# Patient Record
Sex: Female | Born: 1960
Health system: Southern US, Community
[De-identification: ages and names within clinical notes are randomized; demographics above are authoritative.]

## PROBLEM LIST (undated history)

## (undated) DIAGNOSIS — G43909 Migraine, unspecified, not intractable, without status migrainosus: Secondary | ICD-10-CM

## (undated) DIAGNOSIS — T783XXA Angioneurotic edema, initial encounter: Secondary | ICD-10-CM

## (undated) DIAGNOSIS — K219 Gastro-esophageal reflux disease without esophagitis: Secondary | ICD-10-CM

## (undated) DIAGNOSIS — I499 Cardiac arrhythmia, unspecified: Secondary | ICD-10-CM

## (undated) DIAGNOSIS — D219 Benign neoplasm of connective and other soft tissue, unspecified: Secondary | ICD-10-CM

## (undated) DIAGNOSIS — I10 Essential (primary) hypertension: Secondary | ICD-10-CM

## (undated) DIAGNOSIS — L509 Urticaria, unspecified: Secondary | ICD-10-CM

## (undated) DIAGNOSIS — IMO0001 Reserved for inherently not codable concepts without codable children: Secondary | ICD-10-CM

## (undated) DIAGNOSIS — Z95810 Presence of automatic (implantable) cardiac defibrillator: Secondary | ICD-10-CM

## (undated) DIAGNOSIS — N951 Menopausal and female climacteric states: Secondary | ICD-10-CM

## (undated) DIAGNOSIS — J309 Allergic rhinitis, unspecified: Secondary | ICD-10-CM

## (undated) HISTORY — PX: ADENOIDECTOMY: SUR15

## (undated) HISTORY — PX: INGUINAL HERNIA REPAIR: SUR1180

## (undated) HISTORY — DX: Benign neoplasm of connective and other soft tissue, unspecified: D21.9

## (undated) HISTORY — PX: TONSILLECTOMY: SUR1361

## (undated) HISTORY — DX: Allergic rhinitis, unspecified: J30.9

## (undated) HISTORY — PX: KNEE SURGERY: SHX244

## (undated) HISTORY — DX: Reserved for inherently not codable concepts without codable children: IMO0001

## (undated) HISTORY — DX: Urticaria, unspecified: L50.9

## (undated) HISTORY — DX: Cardiac arrhythmia, unspecified: I49.9

## (undated) HISTORY — DX: Angioneurotic edema, initial encounter: T78.3XXA

## (undated) HISTORY — DX: Gastro-esophageal reflux disease without esophagitis: K21.9

## (undated) HISTORY — DX: Menopausal and female climacteric states: N95.1

## (undated) HISTORY — DX: Migraine, unspecified, not intractable, without status migrainosus: G43.909

## (undated) HISTORY — PX: ESOPHAGOGASTRODUODENOSCOPY: SHX1529

## (undated) HISTORY — PX: TONSILLECTOMY AND ADENOIDECTOMY: SHX28

---

## 2006-07-02 HISTORY — PX: BREAST BIOPSY: SHX20

## 2006-07-08 ENCOUNTER — Encounter: Admission: RE | Admit: 2006-07-08 | Discharge: 2006-07-08 | Payer: Self-pay | Admitting: Unknown Physician Specialty

## 2010-07-23 ENCOUNTER — Encounter: Payer: Self-pay | Admitting: Unknown Physician Specialty

## 2011-06-02 HISTORY — PX: COLONOSCOPY: SHX174

## 2014-01-12 ENCOUNTER — Other Ambulatory Visit (HOSPITAL_COMMUNITY): Payer: Self-pay | Admitting: Family Medicine

## 2014-01-12 DIAGNOSIS — R599 Enlarged lymph nodes, unspecified: Secondary | ICD-10-CM

## 2014-01-14 ENCOUNTER — Ambulatory Visit (HOSPITAL_COMMUNITY)
Admission: RE | Admit: 2014-01-14 | Discharge: 2014-01-14 | Disposition: A | Discharge status: Home / Self Care | Payer: 59 | Source: Ambulatory Visit | Attending: Family Medicine | Admitting: Family Medicine

## 2014-01-14 ENCOUNTER — Encounter (HOSPITAL_COMMUNITY): Payer: Self-pay

## 2014-01-14 DIAGNOSIS — R599 Enlarged lymph nodes, unspecified: Secondary | ICD-10-CM | POA: Insufficient documentation

## 2014-01-14 MED ORDER — IOHEXOL 300 MG/ML  SOLN
75.0000 mL | Freq: Once | INTRAMUSCULAR | Status: AC | PRN
  Administered 2014-01-14: 75 mL via INTRAVENOUS

## 2014-02-04 ENCOUNTER — Other Ambulatory Visit (HOSPITAL_COMMUNITY): Payer: Self-pay | Admitting: Family Medicine

## 2014-02-04 DIAGNOSIS — Z139 Encounter for screening, unspecified: Secondary | ICD-10-CM

## 2014-02-12 ENCOUNTER — Ambulatory Visit (HOSPITAL_COMMUNITY)
Admission: RE | Admit: 2014-02-12 | Discharge: 2014-02-12 | Disposition: A | Discharge status: Home / Self Care | Payer: 59 | Source: Ambulatory Visit | Attending: Family Medicine | Admitting: Family Medicine

## 2014-02-12 DIAGNOSIS — Z1231 Encounter for screening mammogram for malignant neoplasm of breast: Secondary | ICD-10-CM | POA: Diagnosis not present

## 2014-02-12 DIAGNOSIS — Z139 Encounter for screening, unspecified: Secondary | ICD-10-CM

## 2014-03-09 ENCOUNTER — Encounter: Payer: Self-pay | Admitting: Adult Health

## 2014-03-09 ENCOUNTER — Ambulatory Visit (INDEPENDENT_AMBULATORY_CARE_PROVIDER_SITE_OTHER): Payer: 59 | Admitting: Adult Health

## 2014-03-09 VITALS — BP 110/72 | HR 74 | Ht 64.0 in | Wt 144.0 lb

## 2014-03-09 DIAGNOSIS — D259 Leiomyoma of uterus, unspecified: Secondary | ICD-10-CM

## 2014-03-09 DIAGNOSIS — R232 Flushing: Secondary | ICD-10-CM

## 2014-03-09 DIAGNOSIS — N951 Menopausal and female climacteric states: Secondary | ICD-10-CM

## 2014-03-09 DIAGNOSIS — Z1212 Encounter for screening for malignant neoplasm of rectum: Secondary | ICD-10-CM

## 2014-03-09 DIAGNOSIS — Z01419 Encounter for gynecological examination (general) (routine) without abnormal findings: Secondary | ICD-10-CM

## 2014-03-09 DIAGNOSIS — D219 Benign neoplasm of connective and other soft tissue, unspecified: Secondary | ICD-10-CM | POA: Insufficient documentation

## 2014-03-09 HISTORY — DX: Menopausal and female climacteric states: N95.1

## 2014-03-09 LAB — HEMOCCULT GUIAC POC 1CARD (OFFICE): Fecal Occult Blood, POC: NEGATIVE

## 2014-03-09 NOTE — Progress Notes (Signed)
Patient ID: Rose Delgado, female   DOB: 25-Feb-1961, 53 y.o.   MRN: [id number] History of Present Illness: Rose Delgado is a 53 year old black female in for a physical,she is new to this practice.She had normal pap last year.She is having hot flashes.She had been on depo til October 2014 and she stopped it.She had a period in July and then in August 24.She works in [place] at [employer].   Current Medications, Allergies, Past Medical History, Past Surgical History, Family History and Social History were reviewed in [employer] record.     Review of Systems: Patient denies any daily headaches, blurred vision, shortness of breath, chest pain, abdominal pain, problems with bowel movements, urination, or intercourse. Not having sex, no joint swelling or mood swings.    Physical Exam:BP 110/72  Pulse 74  Ht 5\' 4"  (1.626 m)  Wt 144 lb (65.318 kg)  BMI 24.71 kg/m2  LMP 02/15/2014 General:  Well developed, well nourished, no acute distress Skin:  Warm and dry Neck:  Midline trachea, mildly enlarged thyroid Lungs; Clear to auscultation bilaterally Breast:  No dominant palpable mass, retraction, or nipple discharge Cardiovascular: Regular rate and rhythm Abdomen:  Soft, non tender, no hepatosplenomegaly Pelvic:  External genitalia is normal in appearance.  The vagina is normal in appearance.  The cervix is smooth.  Uterus is felt to be slightly enlarged in size,has known fibroids.  No   adnexal masses or tenderness noted. Rectal: Good sphincter tone, no polyps, or hemorrhoids felt.  Hemoccult negative. Extremities:  No swelling or varicosities noted Psych:  No mood changes, alert and cooperative,seems happy Discussed HRT and brisdelle for hot flashes, will watch for now,also discussed symptoms of menopause.   Impression: New pt gyn exam no pap Hot flashes Peri menopausal Fibroids     Plan: Physical in 1 year Request copy of last pap Mammogram yearly Colonoscopy in 7  years Lab at PCP or work Get flu shot at work Review handouts on peri menopause and menopause Call as needed with concerns

## 2014-03-09 NOTE — Patient Instructions (Signed)
Menopause Menopause is the normal time of life when menstrual periods stop completely. Menopause is complete when you have missed 12 consecutive menstrual periods. It usually occurs between the ages of 48 years and 55 years. Very rarely does a woman develop menopause before the age of 40 years. At menopause, your ovaries stop producing the female hormones estrogen and progesterone. This can cause undesirable symptoms and also affect your health. Sometimes the symptoms may occur 4-5 years before the menopause begins. There is no relationship between menopause and:  Oral contraceptives.  Number of children you had.  Race.  The age your menstrual periods started (menarche). Heavy smokers and very thin women may develop menopause earlier in life. CAUSES  The ovaries stop producing the female hormones estrogen and progesterone.  Other causes include:  Surgery to remove both ovaries.  The ovaries stop functioning for no known reason.  Tumors of the pituitary gland in the brain.  Medical disease that affects the ovaries and hormone production.  Radiation treatment to the abdomen or pelvis.  Chemotherapy that affects the ovaries. SYMPTOMS   Hot flashes.  Night sweats.  Decrease in sex drive.  Vaginal dryness and thinning of the vagina causing painful intercourse.  Dryness of the skin and developing wrinkles.  Headaches.  Tiredness.  Irritability.  Memory problems.  Weight gain.  Bladder infections.  Hair growth of the face and chest.  Infertility. More serious symptoms include:  Loss of bone (osteoporosis) causing breaks (fractures).  Depression.  Hardening and narrowing of the arteries (atherosclerosis) causing heart attacks and strokes. DIAGNOSIS   When the menstrual periods have stopped for 12 straight months.  Physical exam.  Hormone studies of the blood. TREATMENT  There are many treatment choices and nearly as many questions about them. The  decisions to treat or not to treat menopausal changes is an individual choice made with your health care provider. Your health care provider can discuss the treatments with you. Together, you can decide which treatment will work best for you. Your treatment choices may include:   Hormone therapy (estrogen and progesterone).  Non-hormonal medicines.  Treating the individual symptoms with medicine (for example antidepressants for depression).  Herbal medicines that may help specific symptoms.  Counseling by a psychiatrist or psychologist.  Group therapy.  Lifestyle changes including:  Eating healthy.  Regular exercise.  Limiting caffeine and alcohol.  Stress management and meditation.  No treatment. HOME CARE INSTRUCTIONS   Take the medicine your health care provider gives you as directed.  Get plenty of sleep and rest.  Exercise regularly.  Eat a diet that contains calcium (good for the bones) and soy products (acts like estrogen hormone).  Avoid alcoholic beverages.  Do not smoke.  If you have hot flashes, dress in layers.  Take supplements, calcium, and vitamin D to strengthen bones.  You can use over-the-counter lubricants or moisturizers for vaginal dryness.  Group therapy is sometimes very helpful.  Acupuncture may be helpful in some cases. SEEK MEDICAL CARE IF:   You are not sure you are in menopause.  You are having menopausal symptoms and need advice and treatment.  You are still having menstrual periods after age 55 years.  You have pain with intercourse.  Menopause is complete (no menstrual period for 12 months) and you develop vaginal bleeding.  You need a referral to a specialist (gynecologist, psychiatrist, or psychologist) for treatment. SEEK IMMEDIATE MEDICAL CARE IF:   You have severe depression.  You have excessive vaginal bleeding.    You fell and think you have a broken bone.  You have pain when you urinate.  You develop leg or  chest pain.  You have a fast pounding heart beat (palpitations).  You have severe headaches.  You develop vision problems.  You feel a lump in your breast.  You have abdominal pain or severe indigestion. Document Released: 09/08/2003 Document Revised: 02/18/2013 Document Reviewed: 01/15/2013 [facility] Patient Information 2015 [place], [place]. This information is not intended to replace advice given to you by your health care provider. Make sure you discuss any questions you have with your health care provider. Perimenopause Perimenopause is the time when your body begins to move into the menopause (no menstrual period for 12 straight months). It is a natural process. Perimenopause can begin 2-8 years before the menopause and usually lasts for 1 year after the menopause. During this time, your ovaries may or may not produce an egg. The ovaries vary in their production of estrogen and progesterone hormones each month. This can cause irregular menstrual periods, difficulty getting pregnant, vaginal bleeding between periods, and uncomfortable symptoms. CAUSES  Irregular production of the ovarian hormones, estrogen and progesterone, and not ovulating every month.  Other causes include:  Tumor of the pituitary gland in the brain.  Medical disease that affects the ovaries.  Radiation treatment.  Chemotherapy.  Unknown causes.  Heavy smoking and excessive alcohol intake can bring on perimenopause sooner. SIGNS AND SYMPTOMS   Hot flashes.  Night sweats.  Irregular menstrual periods.  Decreased sex drive.  Vaginal dryness.  Headaches.  Mood swings.  Depression.  Memory problems.  Irritability.  Tiredness.  Weight gain.  Trouble getting pregnant.  The beginning of losing bone cells (osteoporosis).  The beginning of hardening of the arteries (atherosclerosis). DIAGNOSIS  Your health care provider will make a diagnosis by analyzing your age, menstrual history, and  symptoms. He or she will do a physical exam and note any changes in your body, especially your female organs. Female hormone tests may or may not be helpful depending on the amount of female hormones you produce and when you produce them. However, other hormone tests may be helpful to rule out other problems. TREATMENT  In some cases, no treatment is needed. The decision on whether treatment is necessary during the perimenopause should be made by you and your health care provider based on how the symptoms are affecting you and your lifestyle. Various treatments are available, such as:  Treating individual symptoms with a specific medicine for that symptom.  Herbal medicines that can help specific symptoms.  Counseling.  Group therapy. HOME CARE INSTRUCTIONS   Keep track of your menstrual periods (when they occur, how heavy they are, how long between periods, and how long they last) as well as your symptoms and when they started.  Only take over-the-counter or prescription medicines as directed by your health care provider.  Sleep and rest.  Exercise.  Eat a diet that contains calcium (good for your bones) and soy (acts like the estrogen hormone).  Do not smoke.  Avoid alcoholic beverages.  Take vitamin supplements as recommended by your health care provider. Taking vitamin E may help in certain cases.  Take calcium and vitamin D supplements to help prevent bone loss.  Group therapy is sometimes helpful.  Acupuncture may help in some cases. SEEK MEDICAL CARE IF:   You have questions about any symptoms you are having.  You need a referral to a specialist (gynecologist, psychiatrist, or psychologist). SEEK IMMEDIATE  MEDICAL CARE IF:   You have vaginal bleeding.  Your period lasts longer than 8 days.  Your periods are recurring sooner than 21 days.  You have bleeding after intercourse.  You have severe depression.  You have pain when you urinate.  You have severe  headaches.  You have vision problems. Document Released: 07/26/2004 Document Revised: 04/08/2013 Document Reviewed: 01/15/2013 [facility] Patient Information 2015 [place], [place]. This information is not intended to replace advice given to you by your health care provider. Make sure you discuss any questions you have with your health care provider. Physical in 1 year Mammogram yearly Colonoscopy in 7 years Labs at PCP

## 2014-03-11 ENCOUNTER — Encounter: Payer: Self-pay | Admitting: Gastroenterology

## 2014-03-11 ENCOUNTER — Ambulatory Visit (INDEPENDENT_AMBULATORY_CARE_PROVIDER_SITE_OTHER): Payer: 59 | Admitting: Gastroenterology

## 2014-03-11 VITALS — BP 124/73 | HR 75 | Temp 97.6°F | Ht 64.0 in | Wt 143.8 lb

## 2014-03-11 DIAGNOSIS — R439 Unspecified disturbances of smell and taste: Secondary | ICD-10-CM

## 2014-03-11 DIAGNOSIS — R432 Parageusia: Secondary | ICD-10-CM | POA: Insufficient documentation

## 2014-03-11 DIAGNOSIS — K219 Gastro-esophageal reflux disease without esophagitis: Secondary | ICD-10-CM

## 2014-03-11 MED ORDER — DEXLANSOPRAZOLE 60 MG PO CPDR: 60.0000 mg | DELAYED_RELEASE_CAPSULE | Freq: Every day | ORAL | Status: DC

## 2014-03-11 NOTE — Progress Notes (Signed)
Primary Care Physician:  [name], MD  Primary Gastroenterologist:  Barney Drain, MD   Chief Complaint  Patient presents with  . metallic taste    HPI:  Rose Delgado is a 53 y.o. female here for further evaluation of chronic intermittent metallic taste she has had over the past one year. Patient is a self-referral. She is a Therapist, sports at [employer]. She has seen her dentist, Dr. Ahmed Prima, and no findings found to explain her symptoms. Her PCP increased her pantoprazole to BID one month ago but no improvement. Previously was on prevacid several years ago but states she switched to pantoprazole at that time due to some mild metallic taste issues then.   Remotely saw Dr. Berneta Sages for GERD. EGD about 15-20 years ago. Saw Dr. Britta Mccreedy 2 years ago for a screening colonoscopy. States she is due for next exam in 2022.   Wakes up with metallic taste in her mouth. She states family/friends report she has bath breath like bowel. She reports normal taste and smell to food. Good appetite. No weight loss. No abdominal pain, vomiting, constipation, diarrhea, melena, brbpr. She has seasonal allergies and uses Flonase prn for allergic rhinitis. Has not used in 2 months.      Current Outpatient Prescriptions  Medication Sig Dispense Refill  . cyclobenzaprine (FLEXERIL) 10 MG tablet Take 10 mg by mouth as needed for muscle spasms.      Marland Kitchen docusate sodium (COLACE) 100 MG capsule Take 100 mg by mouth at bedtime.      Marland Kitchen ibuprofen (ADVIL,MOTRIN) 800 MG tablet Take 800 mg by mouth as needed.      . loratadine (CLARITIN) 10 MG tablet Take 10 mg by mouth daily.      . metoprolol tartrate (LOPRESSOR) 25 MG tablet Take 25 mg by mouth 2 (two) times daily.      . Multiple Vitamin (MULTIVITAMIN) tablet Take 1 tablet by mouth daily.      . pantoprazole (PROTONIX) 40 MG tablet Take 40 mg by mouth 2 (two) times daily.      . rizatriptan (MAXALT-MLT) 10 MG disintegrating tablet Take 10 mg by mouth as needed for migraine. May  repeat in 2 hours if needed      . SUMAtriptan (IMITREX) 100 MG tablet Take 100 mg by mouth every 2 (two) hours as needed for migraine or headache. May repeat in 2 hours if headache persists or recurs.      . Vitamin D, Ergocalciferol, (DRISDOL) 50000 UNITS CAPS capsule Take 50,000 Units by mouth every 7 (seven) days.       No current facility-administered medications for this visit.    Allergies as of 03/11/2014 - Review Complete 03/11/2014  Allergen Reaction Noted  . Flagyl [metronidazole] Nausea And Vomiting 03/09/2014  . Shellfish allergy  01/14/2014  . Topamax [topiramate] Other (See Comments) 03/09/2014  . Ciprofloxacin hcl Nausea Only 03/09/2014  . Diflucan [fluconazole] Nausea And Vomiting 03/09/2014    Past Medical History  Diagnosis Date  . Reflux   . Migraines   . Fibroids   . Irregular heart beat   . Allergic rhinitis   . Perimenopause 03/09/2014    Past Surgical History  Procedure Laterality Date  . Inguinal hernia repair    . Knee surgery Right   . Tonsillectomy and adenoidectomy    . Breast biopsy Left 2008    benign  . Colonoscopy  06/2011    Dr. Britta Mccreedy: per patient it was normal, follow up in 10 years.   Marland Kitchen  Esophagogastroduodenoscopy      Dr. Berneta Sages: late 1990s/early 2000    Family History  Problem Relation Age of Onset  . Other Mother     glaucoma; pacemaker  . Hypertension Mother   . Other Father     aneursym  . Cancer Sister     biliary, deceased age 53  . Hypertension Sister   . Cancer Brother     lung  . Hypertension Brother   . Sleep apnea Brother   . Hypertension Brother   . Hypertension Brother   . Hypertension Sister   . Hypertension Sister   . Hypertension Sister   . Colon cancer Neg Hx     History   Social History  . Marital Status: Divorced    Spouse Name: N/A    Number of Children: N/A  . Years of Education: N/A   Occupational History  . RN at [employer] History Main Topics  . Smoking status: Never  Smoker   . Smokeless tobacco: Never Used  . Alcohol Use: No  . Drug Use: No  . Sexual Activity: Not Currently   Other Topics Concern  . Not on file   Social History Narrative  . No narrative on file      ROS:  General: Negative for anorexia, weight loss, fever, chills, fatigue, weakness. Eyes: Negative for vision changes.  ENT: Negative for hoarseness, difficulty swallowing , nasal congestion. CV: Negative for chest pain, angina, palpitations, dyspnea on exertion, peripheral edema.  Respiratory: Negative for dyspnea at rest, dyspnea on exertion, cough, sputum, wheezing.  GI: See history of present illness. GU:  Negative for dysuria, hematuria, urinary incontinence, urinary frequency, nocturnal urination.  MS: Negative for joint pain, low back pain.  Derm: Negative for rash or itching.  Neuro: Negative for weakness, abnormal sensation, seizure, frequent headaches, memory loss, confusion.  Psych: Negative for anxiety, depression, suicidal ideation, hallucinations.  Endo: Negative for unusual weight change.  Heme: Negative for bruising or bleeding. Allergy: Negative for rash or hives.    Physical Examination:  BP 124/73  Pulse 75  Temp(Src) 97.6 F (36.4 C) (Oral)  Ht 5\' 4"  (1.626 m)  Wt 143 lb 12.8 oz (65.227 kg)  BMI 24.67 kg/m2  LMP 02/15/2014   General: Well-nourished, well-developed in no acute distress.  Head: Normocephalic, atraumatic.   Eyes: Conjunctiva pink, no icterus. Mouth: Oropharyngeal mucosa moist and pink , no lesions erythema or exudate. Neck: Supple without thyromegaly, masses, or lymphadenopathy.  Lungs: Clear to auscultation bilaterally.  Heart: Regular rate and rhythm, no murmurs rubs or gallops.  Abdomen: Bowel sounds are normal, nontender, nondistended, no hepatosplenomegaly or masses, no abdominal bruits or    hernia , no rebound or guarding.   Rectal: not performed Extremities: No lower extremity edema. No clubbing or deformities.  Neuro:  Alert and oriented x 4 , grossly normal neurologically.  Skin: Warm and dry, no rash or jaundice.   Psych: Alert and cooperative, normal mood and affect.  Labs: Heme negative X 1  Imaging Studies: Mm Digital Screening Bilateral  02/17/2014   CLINICAL DATA:  Screening.  EXAM: DIGITAL SCREENING BILATERAL MAMMOGRAM WITH CAD  COMPARISON:  Previous exam(s).  ACR Breast Density Category d: The breast tissue is extremely dense, which lowers the sensitivity of mammography.  FINDINGS: There are no findings suspicious for malignancy. Images were processed with CAD.  IMPRESSION: No mammographic evidence of malignancy. A result letter of this screening mammogram will be mailed directly to  the patient.  RECOMMENDATION: Screening mammogram in one year. (Code:SM-B-01Y)  BI-RADS CATEGORY  1: Negative.   Electronically Signed   By: Everlean Alstrom M.D.   On: 02/17/2014 08:01

## 2014-03-11 NOTE — Patient Instructions (Addendum)
1. Please stop pantoprazole and start Dexilant one daily. Samples provided. RX sent to pharmacy to see what your copay will be. 2. Allergic rhinitis can actually cause metallic taste. If you still have this problem after trial of Dexilant, then next step may be taking Flonase regularly for 1-2 months to see if helps your taste.  3. Call in 4 weeks and let me know how you are doing.

## 2014-03-11 NOTE — Progress Notes (Signed)
cc'ed to pcp °

## 2014-03-11 NOTE — Assessment & Plan Note (Signed)
Dysgeusia of uncertain etiology. Consider GERD and allergic rhinitis as etiology. Stop pantoprazole and start Lipscomb for next one month. If no noted improvement at that time, consider restarting Flonase prior to possible ENT. She will call with progress report in four weeks.   Discussed antireflux measures with patient. Typical heartburn well controlled but would encourage more water intake, avoid carbonated beverages. We have requested prior EGD report for review.

## 2014-04-22 ENCOUNTER — Telehealth: Payer: Self-pay | Admitting: Gastroenterology

## 2014-04-22 NOTE — Progress Notes (Signed)
Will route to DS to triage

## 2014-04-22 NOTE — Telephone Encounter (Signed)
See addendum to my OV note. Schedule EGD with [facility], triage. No OV needed.   If EGD negative, next step would be ENT evaluation.

## 2014-04-22 NOTE — Progress Notes (Addendum)
Reviewed records from Dr. West Carbo, EGD in September 2005. Severely reddened distal third of the esophagus, no evidence of Barrett's endoscopically, no biopsies.  Will offer EGD for GERD and dysgeusia unresponsive to PPI therapy.  OK to schedule with Dr. Oneida Alar, triage.

## 2014-04-22 NOTE — Telephone Encounter (Signed)
Pt seen [facility] on 9/10 and was told to call back in 4 weeks to let her know how the dexilant was working for her. She said she has some issues with it and it was expensive and didn't think it was helping her any better than when she was taking protonix.  She said that she wanted to go ahead and schedule her EGD.  Since it's been over 30 days from her OV do we need to bring her back into the office or can she be triaged? Please advise and call her at [phone]. She said she has a class this afternoon and if she doesn't answer to leave a message.

## 2014-04-22 NOTE — Telephone Encounter (Signed)
Pt called back and I told her that we are working on it and would call her as soon as we knew something

## 2014-05-06 ENCOUNTER — Telehealth: Payer: Self-pay

## 2014-05-06 NOTE — Progress Notes (Signed)
Pt aware. See separate triage.

## 2014-05-06 NOTE — Telephone Encounter (Addendum)
Gastroenterology Pre-Procedure Review  Request Date: 05/06/2014 Requesting Physician: Jason Coop, PA / Dr. Oneida Alar  PATIENT REVIEW QUESTIONS: The patient responded to the following health history questions as indicated:    1. Diabetes Melitis: no 2. Joint replacements in the past 12 months: no 3. Major health problems in the past 3 months: no 4. Has an artificial valve or MVP: no 5. Has a defibrillator: no 6. Has been advised in past to take antibiotics in advance of a procedure like teeth cleaning: no    MEDICATIONS & ALLERGIES:    Patient reports the following regarding taking any blood thinners:   Plavix? no Aspirin? no Coumadin? no  Patient confirms/reports the following medications:  Current Outpatient Prescriptions  Medication Sig Dispense Refill  . dexlansoprazole (DEXILANT) 60 MG capsule Take 1 capsule (60 mg total) by mouth daily. 90 capsule 3  . docusate sodium (COLACE) 100 MG capsule Take 100 mg by mouth at bedtime.    . fluticasone (FLONASE) 50 MCG/ACT nasal spray Place into both nostrils as needed for allergies or rhinitis.    Marland Kitchen loratadine (CLARITIN) 10 MG tablet Take 10 mg by mouth daily.    . metoprolol tartrate (LOPRESSOR) 25 MG tablet Take 25 mg by mouth 2 (two) times daily.    . Multiple Vitamin (MULTIVITAMIN) tablet Take 1 tablet by mouth daily.    . rizatriptan (MAXALT-MLT) 10 MG disintegrating tablet Take 10 mg by mouth as needed for migraine. May repeat in 2 hours if needed    . Vitamin D, Ergocalciferol, (DRISDOL) 50000 UNITS CAPS capsule Take 50,000 Units by mouth every 7 (seven) days.     No current facility-administered medications for this visit.    Patient confirms/reports the following allergies:  Allergies  Allergen Reactions  . Flagyl [Metronidazole] Nausea And Vomiting  . Shellfish Allergy   . Topamax [Topiramate] Other (See Comments)  . Ciprofloxacin Hcl Nausea Only  . Diflucan [Fluconazole] Nausea And Vomiting    No orders of the defined  types were placed in this encounter.    AUTHORIZATION INFORMATION Primary Insurance:   ID #:   Group #:  Pre-Cert / Auth required: Pre-Cert / Auth #:   Secondary Insurance:   ID #:   Group #:  Pre-Cert / Auth required: Pre-Cert / Auth #:   SCHEDULE INFORMATION: Procedure has been scheduled as follows:  Date:   05/19/2014     Time:  3:15 PM Location: [facility] Stay  This Gastroenterology Pre-Precedure Review Form is being routed to the following provider(s): Barney Drain, MD

## 2014-05-10 ENCOUNTER — Other Ambulatory Visit: Payer: Self-pay

## 2014-05-10 DIAGNOSIS — K219 Gastro-esophageal reflux disease without esophagitis: Secondary | ICD-10-CM

## 2014-05-10 NOTE — Telephone Encounter (Signed)
OK to schedule

## 2014-05-10 NOTE — Telephone Encounter (Signed)
Per Ginger, I had to change the time for the pt to 2:45 pm and she will need to be at the hospital at 1:45 PM. Discarded previous instructions and mailed these new ones with a note about the slight time change.

## 2014-05-10 NOTE — Telephone Encounter (Signed)
Instructions for the EGD mailed to pt.  

## 2014-05-10 NOTE — Telephone Encounter (Signed)
Routing to Neil Crouch, PA in Dr. Nona Dell absence this week.

## 2014-05-19 ENCOUNTER — Encounter (HOSPITAL_COMMUNITY): Payer: Self-pay

## 2014-05-19 ENCOUNTER — Other Ambulatory Visit: Payer: Self-pay

## 2014-05-19 ENCOUNTER — Encounter (HOSPITAL_COMMUNITY)
Admission: RE | Disposition: A | Discharge status: Home / Self Care | Payer: Self-pay | Source: Ambulatory Visit | Attending: Gastroenterology

## 2014-05-19 ENCOUNTER — Ambulatory Visit (HOSPITAL_COMMUNITY)
Admission: RE | Admit: 2014-05-19 | Discharge: 2014-05-19 | Disposition: A | Discharge status: Home / Self Care | Payer: 59 | Source: Ambulatory Visit | Attending: Gastroenterology | Admitting: Gastroenterology

## 2014-05-19 DIAGNOSIS — R432 Parageusia: Secondary | ICD-10-CM | POA: Insufficient documentation

## 2014-05-19 DIAGNOSIS — K2950 Unspecified chronic gastritis without bleeding: Secondary | ICD-10-CM | POA: Diagnosis not present

## 2014-05-19 DIAGNOSIS — K219 Gastro-esophageal reflux disease without esophagitis: Secondary | ICD-10-CM | POA: Diagnosis present

## 2014-05-19 DIAGNOSIS — Z888 Allergy status to other drugs, medicaments and biological substances status: Secondary | ICD-10-CM | POA: Insufficient documentation

## 2014-05-19 DIAGNOSIS — I499 Cardiac arrhythmia, unspecified: Secondary | ICD-10-CM | POA: Insufficient documentation

## 2014-05-19 DIAGNOSIS — G43909 Migraine, unspecified, not intractable, without status migrainosus: Secondary | ICD-10-CM | POA: Diagnosis not present

## 2014-05-19 DIAGNOSIS — R438 Other disturbances of smell and taste: Secondary | ICD-10-CM

## 2014-05-19 DIAGNOSIS — Z881 Allergy status to other antibiotic agents status: Secondary | ICD-10-CM | POA: Diagnosis not present

## 2014-05-19 DIAGNOSIS — J309 Allergic rhinitis, unspecified: Secondary | ICD-10-CM | POA: Insufficient documentation

## 2014-05-19 DIAGNOSIS — Z91013 Allergy to seafood: Secondary | ICD-10-CM | POA: Insufficient documentation

## 2014-05-19 HISTORY — PX: ESOPHAGOGASTRODUODENOSCOPY: SHX5428

## 2014-05-19 SURGERY — EGD (ESOPHAGOGASTRODUODENOSCOPY)
Anesthesia: Moderate Sedation

## 2014-05-19 MED ORDER — STERILE WATER FOR IRRIGATION IR SOLN
Status: DC | PRN
  Administered 2014-05-19: 15:00:00

## 2014-05-19 MED ORDER — LIDOCAINE VISCOUS 2 % MT SOLN
OROMUCOSAL | Status: DC | PRN
  Administered 2014-05-19: 4 mL via OROMUCOSAL

## 2014-05-19 MED ORDER — MEPERIDINE HCL 100 MG/ML IJ SOLN
INTRAMUSCULAR | Status: DC | PRN
  Administered 2014-05-19 (×4): 25 mg via INTRAVENOUS

## 2014-05-19 MED ORDER — MEPERIDINE HCL 100 MG/ML IJ SOLN
INTRAMUSCULAR | Status: AC
  Filled 2014-05-19: qty 2

## 2014-05-19 MED ORDER — MIDAZOLAM HCL 5 MG/5ML IJ SOLN
INTRAMUSCULAR | Status: DC | PRN
Start: 2014-05-19 — End: 2014-05-19
  Administered 2014-05-19: 2 mg via INTRAVENOUS
  Administered 2014-05-19: 1 mg via INTRAVENOUS
  Administered 2014-05-19: 2 mg via INTRAVENOUS

## 2014-05-19 MED ORDER — SODIUM CHLORIDE 0.9 % IV SOLN
INTRAVENOUS | Status: DC
  Administered 2014-05-19: 14:00:00 via INTRAVENOUS

## 2014-05-19 MED ORDER — PANTOPRAZOLE SODIUM 40 MG PO TBEC: DELAYED_RELEASE_TABLET | ORAL | Status: DC

## 2014-05-19 MED ORDER — LIDOCAINE VISCOUS 2 % MT SOLN
OROMUCOSAL | Status: AC
  Filled 2014-05-19: qty 15

## 2014-05-19 MED ORDER — MIDAZOLAM HCL 5 MG/5ML IJ SOLN
INTRAMUSCULAR | Status: AC
  Filled 2014-05-19: qty 10

## 2014-05-19 NOTE — Telephone Encounter (Signed)
REVIEWED.  

## 2014-05-19 NOTE — H&P (Signed)
Primary Care Physician:  [name], MD Primary Gastroenterologist:  Dr. Oneida Alar  Pre-Procedure History & Physical: HPI:  Rose Delgado is a 53 y.o. female here for DYSPEPSIA.  Past Medical History  Diagnosis Date  . Reflux   . Migraines   . Fibroids   . Irregular heart beat   . Allergic rhinitis   . Perimenopause 03/09/2014   Past Surgical History  Procedure Laterality Date  . Inguinal hernia repair    . Knee surgery Right   . Tonsillectomy and adenoidectomy    . Breast biopsy Left 2008    benign  . Colonoscopy  06/2011    Dr. Britta Mccreedy: per patient it was normal, follow up in 10 years.   . Esophagogastroduodenoscopy      Dr. Berneta Sages: late 1990s/early 2000    Prior to Admission medications   Medication Sig Start Date End Date Taking? Authorizing Provider  dexlansoprazole (DEXILANT) 60 MG capsule Take 1 capsule (60 mg total) by mouth daily. 03/11/14  Yes Mahala Menghini, PA-C  docusate sodium (COLACE) 100 MG capsule Take 100 mg by mouth at bedtime.   Yes Historical Provider, MD  fluticasone (FLONASE) 50 MCG/ACT nasal spray Place into both nostrils as needed for allergies or rhinitis.   Yes Historical Provider, MD  loratadine (CLARITIN) 10 MG tablet Take 10 mg by mouth daily.   Yes Historical Provider, MD  metoprolol tartrate (LOPRESSOR) 25 MG tablet Take 25 mg by mouth 2 (two) times daily.   Yes Historical Provider, MD  Multiple Vitamin (MULTIVITAMIN) tablet Take 1 tablet by mouth daily.   Yes Historical Provider, MD  rizatriptan (MAXALT-MLT) 10 MG disintegrating tablet Take 10 mg by mouth as needed for migraine. May repeat in 2 hours if needed   Yes Historical Provider, MD  Vitamin D, Ergocalciferol, (DRISDOL) 50000 UNITS CAPS capsule Take 50,000 Units by mouth every 7 (seven) days.   Yes Historical Provider, MD    Allergies as of 05/10/2014 - Review Complete 05/10/2014  Allergen Reaction Noted  . Flagyl [metronidazole] Nausea And Vomiting 03/09/2014  . Shellfish  allergy  01/14/2014  . Topamax [topiramate] Other (See Comments) 03/09/2014  . Ciprofloxacin hcl Nausea Only 03/09/2014  . Diflucan [fluconazole] Nausea And Vomiting 03/09/2014    Family History  Problem Relation Age of Onset  . Other Mother     glaucoma; pacemaker  . Hypertension Mother   . Other Father     aneursym  . Cancer Sister     biliary, deceased age 18  . Hypertension Sister   . Cancer Brother     lung  . Hypertension Brother   . Sleep apnea Brother   . Hypertension Brother   . Hypertension Brother   . Hypertension Sister   . Hypertension Sister   . Hypertension Sister   . Colon cancer Neg Hx     History   Social History  . Marital Status: Divorced    Spouse Name: N/A    Number of Children: N/A  . Years of Education: N/A   Occupational History  . RN at [employer] History Main Topics  . Smoking status: Never Smoker   . Smokeless tobacco: Never Used  . Alcohol Use: No  . Drug Use: No  . Sexual Activity: Not Currently   Other Topics Concern  . Not on file   Social History Narrative   Review of Systems: See HPI, otherwise negative ROS  Physical Exam: BP 122/76 mmHg  Pulse 75  Temp(Src) 97.5 F (36.4 C) (Oral)  Resp 22  Ht 5\' 4"  (1.626 m)  Wt 145 lb (65.772 kg)  BMI 24.88 kg/m2  SpO2 97%  LMP 05/03/2014 General:   Alert,  pleasant and cooperative in NAD Head:  Normocephalic and atraumatic. Neck:  Supple; Lungs:  Clear throughout to auscultation.    Heart:  Regular rate and rhythm. Abdomen:  Soft, nontender and nondistended. Normal bowel sounds, without guarding, and without rebound.   Neurologic:  Alert and  oriented x4;  grossly normal neurologically.  Impression/Plan:    DYSPEPSIA  PLAN:  EGD TODAY

## 2014-05-19 NOTE — Progress Notes (Signed)
REVIEWED-NO ADDITIONAL RECOMMENDATIONS. 

## 2014-05-19 NOTE — Discharge Instructions (Signed)
You have mild gastritis. I biopsied your stomach.   CONTINUE DEXILANT TO CONTROL REFLUX SYMPTOMS.  Check zinc level today. Add zinc 50 mg daily. I WILL REFER YOU TO NEUROLOGY FOR METALLIC TASTE IN YOUR MOUTH.  AVOID TRIGGERS FOR GASTRITIS. SEE INFO BELOW.  FOLLOW A LOW FAT DIET. SEE INFO BELOW.  FOLLOW UP IN 4 MOS.  UPPER ENDOSCOPY AFTER CARE Read the instructions outlined below and refer to this sheet in the next week. These discharge instructions provide you with general information on caring for yourself after you leave the hospital. While your treatment has been planned according to the most current medical practices available, unavoidable complications occasionally occur. If you have any problems or questions after discharge, call DR. FIELDS, [phone].  ACTIVITY  You may resume your regular activity, but move at a slower pace for the next 24 hours.   Take frequent rest periods for the next 24 hours.   Walking will help get rid of the air and reduce the bloated feeling in your belly (abdomen).   No driving for 24 hours (because of the medicine (anesthesia) used during the test).   You may shower.   Do not sign any important legal documents or operate any machinery for 24 hours (because of the anesthesia used during the test).    NUTRITION  Drink plenty of fluids.   You may resume your normal diet as instructed by your doctor.   Begin with a light meal and progress to your normal diet. Heavy or fried foods are harder to digest and may make you feel sick to your stomach (nauseated).   Avoid alcoholic beverages for 24 hours or as instructed.    MEDICATIONS  You may resume your normal medications.   WHAT YOU CAN EXPECT TODAY  Some feelings of bloating in the abdomen.   Passage of more gas than usual.    IF YOU HAD A BIOPSY TAKEN DURING THE UPPER ENDOSCOPY:  Eat a soft diet IF YOU HAVE NAUSEA, BLOATING, ABDOMINAL PAIN, OR VOMITING.    FINDING OUT THE  RESULTS OF YOUR TEST Not all test results are available during your visit. DR. Oneida Alar WILL CALL YOU WITHIN 7 DAYS OF YOUR PROCEDUE WITH YOUR RESULTS. Do not assume everything is normal if you have not heard from DR. FIELDS IN ONE WEEK, CALL HER OFFICE AT [phone].  SEEK IMMEDIATE MEDICAL ATTENTION AND CALL THE OFFICE: [phone] IF:  You have more than a spotting of blood in your stool.   Your belly is swollen (abdominal distention).   You are nauseated or vomiting.   You have a temperature over 101F.   You have abdominal pain or discomfort that is severe or gets worse throughout the day.   Gastritis  Gastritis is an inflammation (the body's way of reacting to injury and/or infection) of the stomach. It is often caused by viral or bacterial (germ) infections. It can also be caused BY ASPIRIN, BC/GOODY POWDER'S, (IBUPROFEN) MOTRIN, OR ALEVE (NAPROXEN), chemicals (including alcohol), SPICY FOODS, and medications. This illness may be associated with generalized malaise (feeling tired, not well), UPPER ABDOMINAL STOMACH cramps, and fever. One common bacterial cause of gastritis is an organism known as H. Pylori. This can be treated with antibiotics.    Low-Fat Diet  BREADS, CEREALS, PASTA, RICE, DRIED PEAS, AND BEANS These products are high in carbohydrates and most are low in fat. Therefore, they can be increased in the diet as substitutes for fatty foods. They too, however, contain calories and  should not be eaten in excess. Cereals can be eaten for snacks as well as for breakfast.  Include foods that contain fiber (fruits, vegetables, whole grains, and legumes). Research shows that fiber may lower blood cholesterol levels, especially the water-soluble fiber found in fruits, vegetables, oat products, and legumes.  FRUITS AND VEGETABLES It is good to eat fruits and vegetables. Besides being sources of fiber, both are rich in vitamins and some minerals. They help you get the daily  allowances of these nutrients. Fruits and vegetables can be used for snacks and desserts.  MEATS Limit lean meat, chicken, [place], and fish to no more than 6 ounces per day.  Beef, Pork, and Lamb Use lean cuts of beef, pork, and lamb. Lean cuts include:  Extra-lean ground beef.  Arm roast.  Sirloin tip.  Center-cut ham.  Round steak.  Loin chops.  Rump roast.  Tenderloin.  Trim all fat off the outside of meats before cooking. It is not necessary to severely decrease the intake of red meat, but lean choices should be made. Lean meat is rich in protein and contains a highly absorbable form of iron. Premenopausal women, in particular, should avoid reducing lean red meat because this could increase the risk for low red blood cells (iron-deficiency anemia).  Chicken and [place] These are good sources of protein. The fat of poultry can be reduced by removing the skin and underlying fat layers before cooking. Chicken and [place] can be substituted for lean red meat in the diet. Poultry should not be fried or covered with high-fat sauces.  Fish and Shellfish Fish is a good source of protein. Shellfish contain cholesterol, but they usually are low in saturated fatty acids. The preparation of fish is important. Like chicken and [place], they should not be fried or covered with high-fat sauces.  EGGS Egg whites contain no fat or cholesterol. They can be eaten often. Try 1 to 2 egg whites instead of whole eggs in recipes or use egg substitutes that do not contain yolk.  MILK AND DAIRY PRODUCTS Use skim or 1% milk instead of 2% or whole milk. Decrease whole milk, natural, and processed cheeses. Use nonfat or low-fat (2%) cottage cheese or low-fat cheeses made from vegetable oils. Choose nonfat or low-fat (1 to 2%) yogurt. Experiment with evaporated skim milk in recipes that call for heavy cream. Substitute low-fat yogurt or low-fat cottage cheese for sour cream in dips and salad dressings. Have at least 2  servings of low-fat dairy products, such as 2 glasses of skim (or 1%) milk each day to help get your daily calcium intake.  FATS AND OILS Reduce the total intake of fats, especially saturated fat. Butterfat, lard, and beef fats are high in saturated fat and cholesterol. These should be avoided as much as possible. Vegetable fats do not contain cholesterol, but certain vegetable fats, such as coconut oil, palm oil, and palm kernel oil are very high in saturated fats. These should be limited. These fats are often used in bakery goods, processed foods, popcorn, oils, and nondairy creamers. Vegetable shortenings and some peanut butters contain hydrogenated oils, which are also saturated fats. Read the labels on these foods and check for saturated vegetable oils.  Unsaturated vegetable oils and fats do not raise blood cholesterol. However, they should be limited because they are fats and are high in calories. Total fat should still be limited to 30% of your daily caloric intake. Desirable liquid vegetable oils are corn oil, cottonseed oil, olive oil,  canola oil, safflower oil, soybean oil, and sunflower oil. Peanut oil is not as good, but small amounts are acceptable. Buy a heart-healthy tub margarine that has no partially hydrogenated oils in the ingredients. Mayonnaise and salad dressings often are made from unsaturated fats, but they should also be limited because of their high calorie and fat content. Seeds, nuts, peanut butter, olives, and avocados are high in fat, but the fat is mainly the unsaturated type. These foods should be limited mainly to avoid excess calories and fat.  OTHER EATING TIPS Snacks  Most sweets should be limited as snacks. They tend to be rich in calories and fats, and their caloric content outweighs their nutritional value. Some good choices in snacks are graham crackers, melba toast, soda crackers, bagels (no egg), English muffins, fruits, and vegetables. These snacks are preferable  to snack crackers, [place] fries, and chips. Popcorn should be air-popped or cooked in small amounts of liquid vegetable oil.  Desserts Eat fruit, low-fat yogurt, and fruit ices instead of pastries, cake, and cookies. Sherbet, angel food cake, gelatin dessert, frozen low-fat yogurt, or other frozen products that do not contain saturated fat (pure fruit juice bars, frozen ice pops) are also acceptable.   COOKING METHODS Choose those methods that use little or no fat. They include: Poaching.  Braising.  Steaming.  Grilling.  Baking.  Stir-frying.  Broiling.  Microwaving.  Foods can be cooked in a nonstick pan without added fat, or use a nonfat cooking spray in regular cookware. Limit fried foods and avoid frying in saturated fat. Add moisture to lean meats by using water, broth, cooking wines, and other nonfat or low-fat sauces along with the cooking methods mentioned above. Soups and stews should be chilled after cooking. The fat that forms on top after a few hours in the refrigerator should be skimmed off. When preparing meals, avoid using excess salt. Salt can contribute to raising blood pressure in some people.  EATING AWAY FROM HOME Order entres, potatoes, and vegetables without sauces or butter. When meat exceeds the size of a deck of cards (3 to 4 ounces), the rest can be taken home for another meal. Choose vegetable or fruit salads and ask for low-calorie salad dressings to be served on the side. Use dressings sparingly. Limit high-fat toppings, such as bacon, crumbled eggs, cheese, sunflower seeds, and olives. Ask for heart-healthy tub margarine instead of butter.

## 2014-05-19 NOTE — Op Note (Signed)
[facility] [address] [place], [address]   ENDOSCOPY PROCEDURE REPORT  PATIENT: Rose Delgado, Rose Delgado  MR#: [id number] BIRTHDATE: [birth year] , 59  yrs. old GENDER: female  ENDOSCOPIST: Barney Drain, MD REFERRED WN:IOEVO Quillian Quince, M.D. PROCEDURE DATE: 2014-06-08 PROCEDURE:   EGD w/ biopsy  INDICATIONS:dysgeusia-PMHx: MILD GERD ON DEXILANT.  USED PRILOSEC FOR YEARS.  TRIED PROTONIX BID. MEDICATIONS: Demerol 100 mg IV and Versed 5 mg IV TOPICAL ANESTHETIC:   Viscous Xylocaine ASA CLASS:  DESCRIPTION OF PROCEDURE:     Physical exam was performed.  Informed consent was obtained from the patient after explaining the benefits, risks, and alternatives to the procedure.  The patient was connected to the monitor and placed in the left lateral position.  Continuous oxygen was provided by nasal cannula and IV medicine administered through an indwelling cannula.  After administration of sedation, the patients esophagus was intubated and the EG-[id number] ([id number])  endoscope was advanced under direct visualization to the second portion of the duodenum.  The scope was removed slowly by carefully examining the color, texture, anatomy, and integrity of the mucosa on the way out.  The patient was recovered in endoscopy and discharged home in satisfactory condition.   ESOPHAGUS: The mucosa of the esophagus appeared normal.   STOMACH: Mild non-erosive gastritis (inflammation) was found in the gastric antrum.  Multiple biopsies were performed using cold forceps. DUODENUM: The duodenal mucosa showed no abnormalities in the bulb and 2nd part of the duodenum. COMPLICATIONS: There were no immediate complications.  ENDOSCOPIC IMPRESSION: 1.   NO OBVIOUS SOURCE FOR DYSGEUSIA IDENTIFED 2.   MILD Non-erosive gastritis  RECOMMENDATIONS: CONTINUE DEXILANT TO CONTROL REFLUX SYMPTOMS. Check zinc level today.  Add zinc 50 mg daily. REFER TO NEUROLOGY FOR DYSGEUSIA. AVOID TRIGGERS FOR  GASTRITIS. FOLLOW A LOW FAT DIET. AWAIT BIOPSY. FOLLOW UP IN 4 MOS.  REPEAT EXAM:  eSigned:  Barney Drain, MD 06-08-2014 3:49 PM     CPT CODES: ICD CODES:  The ICD and CPT codes recommended by this software are interpretations from the data that the clinical staff has captured with the software.  The verification of the translation of this report to the ICD and CPT codes and modifiers is the sole responsibility of the health care institution and practicing physician where this report was generated.  [employer] will not be held responsible for the validity of the ICD and CPT codes included on this report.  AMA assumes no liability for data contained or not contained herein. CPT is a Designer, television/film set of the [employer].

## 2014-05-21 ENCOUNTER — Telehealth: Payer: Self-pay | Admitting: Gastroenterology

## 2014-05-21 LAB — ZINC: Zinc: 73 ug/dL (ref 60–130)

## 2014-05-21 MED ORDER — ONDANSETRON HCL 4 MG PO TABS: 4.0000 mg | ORAL_TABLET | ORAL | Status: DC | PRN

## 2014-05-21 NOTE — Telephone Encounter (Signed)
Patient had endo Wednesday 05/19/14 and has been experiencing nausea ever since.  Please advise [phone]

## 2014-05-21 NOTE — Telephone Encounter (Signed)
I spoke to the pt and she said she had vomiting when she first got home from the EGD on 05/19/2014. She has had a lot of nausea ever since. She has had some phenergan, but cannot drive with it. OK to use [facility] today, since she will not be at the hospital for a few days.  Sending note to Neil Crouch, PA since she saw pt in the office.

## 2014-05-21 NOTE — Telephone Encounter (Signed)
Pt is aware.  

## 2014-05-21 NOTE — Telephone Encounter (Signed)
Will send in RX for zofran. Call with persistent symptoms.

## 2014-05-24 ENCOUNTER — Telehealth: Payer: Self-pay | Admitting: Gastroenterology

## 2014-05-24 ENCOUNTER — Encounter (HOSPITAL_COMMUNITY): Payer: Self-pay | Admitting: Gastroenterology

## 2014-05-24 NOTE — Telephone Encounter (Signed)
PATIENT CALLED AND STATES THAT ZOFRAN IS WORKING BUT WHEN SHE STOPS TAKING IT THE NAUSEA RETURNS, SHOULD SHE STILL BE HAVING NAUSEA THIS LONG AFTER A PROCEDURE  PLEASE ADVISE

## 2014-05-25 ENCOUNTER — Telehealth: Payer: Self-pay | Admitting: Gastroenterology

## 2014-05-25 NOTE — Telephone Encounter (Signed)
APPT MADE AND PATIENT AWARE °

## 2014-05-25 NOTE — Telephone Encounter (Signed)
Talked with patient and told her that the Zinc levels are normal but we would have to see if [facility] had any other information for her.

## 2014-05-25 NOTE — Telephone Encounter (Signed)
PT said she is still having nausea even with the Zofran. She has not been taking it with food, but said she will it with food to see if it makes a difference.  She is having some abdominal pain. She has a BM almost everyday, but the stool is more hard than usual.  She said for years she has had intermittent sweats when she feels the urge to have a BM, and that is worse now.  She takes a Colace daily. Please advise!

## 2014-05-25 NOTE — Telephone Encounter (Signed)
Pt is aware. OK to schedule appt with Dr. Oneida Alar.

## 2014-05-25 NOTE — Telephone Encounter (Signed)
Abdominal pain was not reported during her OV. She needs an OV.  She can continue Zofran prn nausea. I would think any nausea related to anesthesia should have been gone by now.   She may require further work-up but needs OV first to discuss current/new symptoms.   See if we can get her in on [facility] schedule.

## 2014-05-25 NOTE — Telephone Encounter (Signed)
PATIENT CALLED INQUIRING ABOUT HER LAB RESULTS AND SHE ALSO HAS OTHER QUESTIONS ABOUT HER NAUSEA.  PLEASE CALL BACK.

## 2014-05-25 NOTE — Telephone Encounter (Signed)
LMOM for a return call. ( Just talked to her this afternoon).

## 2014-05-26 NOTE — Telephone Encounter (Signed)
Routing to Dr. Fields.  

## 2014-06-07 NOTE — Telephone Encounter (Signed)
Patient wants to keep visit in January due to abdominal pain

## 2014-06-07 NOTE — Telephone Encounter (Signed)
Patient has appointment for January

## 2014-06-07 NOTE — Telephone Encounter (Signed)
PLEASE CALL PT. HER ZINC LEVEL AND HER GASTRIC BIOPSIES SHOW GASTRITIS. SHE SHOULD SEE NEUROLOGY FOR THE METALLIC TASTE IN HER MOUTH. CONTINUE DEXILANT & Zinc. FOLLOW A LOW FAT DIET. OPV MAR 2016 E30 GASTRITIS, GERD, CHANGE IN TASTE.

## 2014-06-08 NOTE — Telephone Encounter (Signed)
Pt aware of results 

## 2014-07-02 DIAGNOSIS — Z780 Asymptomatic menopausal state: Secondary | ICD-10-CM

## 2014-07-02 HISTORY — DX: Asymptomatic menopausal state: Z78.0

## 2014-07-14 ENCOUNTER — Encounter: Payer: Self-pay | Admitting: Gastroenterology

## 2014-07-14 ENCOUNTER — Ambulatory Visit (INDEPENDENT_AMBULATORY_CARE_PROVIDER_SITE_OTHER): Payer: 59 | Admitting: Gastroenterology

## 2014-07-14 VITALS — BP 127/78 | HR 66 | Temp 98.2°F | Ht 64.0 in | Wt 142.0 lb

## 2014-07-14 DIAGNOSIS — R112 Nausea with vomiting, unspecified: Secondary | ICD-10-CM

## 2014-07-14 DIAGNOSIS — K219 Gastro-esophageal reflux disease without esophagitis: Secondary | ICD-10-CM

## 2014-07-14 DIAGNOSIS — R432 Parageusia: Secondary | ICD-10-CM

## 2014-07-14 HISTORY — DX: Nausea with vomiting, unspecified: R11.2

## 2014-07-14 MED ORDER — ONDANSETRON HCL 4 MG PO TABS: 4.0000 mg | ORAL_TABLET | ORAL | Status: AC | PRN

## 2014-07-14 NOTE — Assessment & Plan Note (Signed)
SX STARTED AFTER EGD AND STARTING ZINC. DOBT SX DUE TO ADVERSE REACTION TO DEMEROL, GASTROPARESIS, OR UNCONTROLLED GERD.  STOP ZINC PLAN FOR GES IN ONE MO IF SX NOTRESOLVED. ORDER ENTERED. PT WILL CALL IF SHE COMPLETES THE STUDY. CONTINUE DEXILANT

## 2014-07-14 NOTE — Progress Notes (Signed)
   Subjective:    Patient ID: Rose Delgado, female    DOB: [birth year], 54 y.o.   MRN: [id number]  [name], MD  HPI DOESN'T THINK ZINC HAS HELPED. HAD ABD PAIN/NV AFTER EGD NOW ABD PAIN RESOLVED BUT NAUSEA: 2X/WEEK/VOMTING: 2X/WK. SX MAY LAST < 1 HR IF TAKES ZOFRAN AND EATS. NO BLOOD IN VOMIT. ZINC MAKES HER CONSTIPATED. NO CHANGE IN BOWEL HABITS ASSOCIATED WITH NV. HAS NAUSEA THEN THROWS UP. NO KNOWN TRIGGERS. DEXILANT STARTED BEFORE PROCEDURE.   PT DENIES FEVER, CHILLS, HEMATOCHEZIA, melena, diarrhea, CHEST PAIN, SHORTNESS OF BREATH,  CHANGE IN BOWEL IN HABITS, abdominal pain, problems swallowing, problems with sedation, OR heartburn or indigestion.   Past Medical History  Diagnosis Date  . Reflux   . Migraines   . Fibroids   . Irregular heart beat   . Allergic rhinitis   . Perimenopause 03/09/2014   Past Medical History  Diagnosis Date  . Reflux   . Migraines   . Fibroids   . Irregular heart beat   . Allergic rhinitis   . Perimenopause 03/09/2014    Allergies  Allergen Reactions  . Flagyl [Metronidazole] Nausea And Vomiting  . Shellfish Allergy   . Topamax [Topiramate] Other (See Comments)  . Ciprofloxacin Hcl Nausea Only  . Diflucan [Fluconazole] Nausea And Vomiting    Current Outpatient Prescriptions  Medication Sig Dispense Refill  . dexlansoprazole (DEXILANT) 60 MG capsule Take 1 capsule (60 mg total) by mouth daily.    Marland Kitchen docusate sodium (COLACE) 100 MG capsule Take 100 mg by mouth at bedtime.    . fluticasone (FLONASE) 50 MCG/ACT nasal spray Place into both nostrils as needed for allergies or rhinitis.    Marland Kitchen loratadine (CLARITIN) 10 MG tablet Take 10 mg by mouth daily.    . metoprolol tartrate (LOPRESSOR) 25 MG tablet Take 25 mg by mouth 2 (two) times daily.    . Multiple Vitamin (MULTIVITAMIN) tablet Take 1 tablet by mouth daily.    . ondansetron (ZOFRAN) 4 MG tablet Take 1 tablet (4 mg total) by mouth every 4 (four) hours as needed for nausea or vomiting.   0  . rizatriptan (MAXALT-MLT) 10 MG disintegrating tablet Take 10 mg by mouth as needed for migraine. May repeat in 2 hours if needed    . Vitamin D, Ergocalciferol, (DRISDOL) 50000 UNITS CAPS capsule Take 50,000 Units by mouth every 7 (seven) days.     ZINC DAILY.   Review of Systems     Objective:   Physical Exam  Constitutional: She is oriented to person, place, and time. She appears well-developed and well-nourished. No distress.  HENT:  Head: Normocephalic and atraumatic.  Mouth/Throat: Oropharynx is clear and moist. No oropharyngeal exudate.  Eyes: Pupils are equal, round, and reactive to light. No scleral icterus.  Neck: Normal range of motion. Neck supple.  Cardiovascular: Normal rate, regular rhythm and normal heart sounds.   Pulmonary/Chest: Effort normal and breath sounds normal. No respiratory distress.  Abdominal: Soft. Bowel sounds are normal. She exhibits no distension. There is no tenderness. There is no rebound and no guarding.  MILD BLQs TTP epigastric pressure with palpation  Musculoskeletal: She exhibits no edema.  Lymphadenopathy:    She has no cervical adenopathy.  Neurological: She is alert and oriented to person, place, and time.  NO FOCAL DEFICITS   Psychiatric: She has a normal mood and affect.  Vitals reviewed.         Assessment & Plan:

## 2014-07-14 NOTE — Assessment & Plan Note (Signed)
SX CONTROLLED.  LOW FAT DIET CONTINUE DEXILANT FOLLOW UP IN 4 MOS.

## 2014-07-14 NOTE — Patient Instructions (Signed)
SEE DR. [place].  STOP TAKING ZINC.  IF YOUR SYMPTOM,S ARE NOT RESOLVED AFTER ONE MONTH, COMPLETE THE GASTRIC EMPTYING STUDY TO EVALUATE FOR GASTROPARESIS. CALL THE OFFICE IF YOU DECIDE TO HAVE THE EMPTYING STUDY.  CONTINUE DEXILANT  FOLLOW A LOW FAT DIET. SEE INFO BELOW.  FOLLOW UP IN 4 MOS.

## 2014-07-14 NOTE — Progress Notes (Signed)
On the recall list  

## 2014-07-14 NOTE — Progress Notes (Signed)
cc'ed to pcp °

## 2014-07-14 NOTE — Assessment & Plan Note (Signed)
Sx not improved with ZINC. DEVELOPED NV AFTER EGD AND STARTING ZINC.  STOP ZINC FOLLOW UP WITH DR. Merlene Laughter FOLLOW UP IN 4 MOS.

## 2014-08-09 ENCOUNTER — Telehealth: Payer: Self-pay | Admitting: Gastroenterology

## 2014-08-09 ENCOUNTER — Other Ambulatory Visit: Payer: Self-pay

## 2014-08-09 DIAGNOSIS — K623 Rectal prolapse: Secondary | ICD-10-CM

## 2014-08-09 NOTE — Telephone Encounter (Signed)
PATIENT CALLED WITH QUESTIONS REGARDING THE GASTRIC EMPTYING TEST. FIELDS HAD MENTIONED TO HER HAVING ONE.  AND ALSO ABOUT LABS FOR OTHER TESTING  PLEASE CALL [phone]

## 2014-08-09 NOTE — Telephone Encounter (Signed)
Called pt and she wants to proceed with the GES. I will call pt back once it is arranged.

## 2014-08-09 NOTE — Telephone Encounter (Signed)
Called pt and she is aware of Gastric emptying test. It is set for 08/12/2014 @ 10:00am. Pt states she will have to call and reschedule due to her work schedule. Pt has the number.   No pre-cert required per [facility]: spoke with HeatherS. 08/09/2014

## 2014-08-10 ENCOUNTER — Encounter (HOSPITAL_COMMUNITY): Payer: Self-pay

## 2014-08-10 ENCOUNTER — Ambulatory Visit (HOSPITAL_COMMUNITY)
Admission: RE | Admit: 2014-08-10 | Discharge: 2014-08-10 | Disposition: A | Discharge status: Home / Self Care | Payer: 59 | Source: Ambulatory Visit | Attending: Gastroenterology | Admitting: Gastroenterology

## 2014-08-10 DIAGNOSIS — R112 Nausea with vomiting, unspecified: Secondary | ICD-10-CM | POA: Diagnosis not present

## 2014-08-10 MED ORDER — TECHNETIUM TC 99M SULFUR COLLOID
2.0000 | Freq: Once | INTRAVENOUS | Status: AC | PRN
  Administered 2014-08-10: 2 via ORAL

## 2014-08-12 ENCOUNTER — Other Ambulatory Visit (HOSPITAL_COMMUNITY): Payer: 59

## 2014-08-16 NOTE — Telephone Encounter (Addendum)
PLEASE CALL PT. HER GASTRIC EMPTYING STUDY SHOWS A MILD DELAY IN HER STOMACH EMPTYING SOLID FOODS. SHE SHOULD FOLLOW A STEP III GASTROPARESIS DIET. SHE CAN GO TO [email or web address] TO GET A DIET HANDOUT, PICK UP ONE AT THE [facility], OR WE CAN MAIL HER ONE. SHE DOES NOT NEED MEDICINE. SHE JUST NEEDS TO WATCH WHAT SHE EATS. HER NAUSEA AND VOMITING IS DUE TO REFLUX AND DELAYED GASTRIC EMPTYING. CONTINUE DEXILANT. [facility] IN JUN 2016 E30 GASTROPARESIS/GERD.

## 2014-08-17 NOTE — Telephone Encounter (Signed)
Diet mailed to pt

## 2014-08-17 NOTE — Telephone Encounter (Signed)
ON RECALL LIST  °

## 2014-08-17 NOTE — Telephone Encounter (Signed)
LMOM for a return call.  

## 2014-08-17 NOTE — Telephone Encounter (Signed)
Pt is aware and would like for [place] to mail her the diet handout

## 2014-09-17 ENCOUNTER — Encounter: Payer: Self-pay | Admitting: Nutrition

## 2014-09-17 ENCOUNTER — Encounter: Payer: 59 | Attending: Gastroenterology | Admitting: Nutrition

## 2014-09-17 VITALS — Ht 63.0 in | Wt 137.0 lb

## 2014-09-17 DIAGNOSIS — K219 Gastro-esophageal reflux disease without esophagitis: Secondary | ICD-10-CM

## 2014-09-17 DIAGNOSIS — K3184 Gastroparesis: Secondary | ICD-10-CM

## 2014-09-17 NOTE — Progress Notes (Signed)
  Medical Nutrition Therapy:  Appt start time: 0830 end time:  0930.  Assessment:  Primary concerns today: Gastroparesis and Reflux. LIves by herself. Eats at home and cooks. Cooks at home a lot and bakes and broils most of her foods. Has weighed as little as 97 lbs and as much as 151 lbs but prefers to be around 125-135 lbs. Complains of a lot of bloating, constipation and reflux. Wants to work on eating healthier and start exercising to help improve her gastroparesis, constipation and reflux symptoms. Works at [employer] at [facility].     Diet is inconsistent and insuffient to meet her nutritional needs. Is lactose intolerant. Doesn't like Lactaid but willing to drink Almond milk.   Preferred Learning Style:    Auditory  Visual  Hands on  Learning Readiness:    Ready  Change in progress  MEDICATIONS: See ;ost   DIETARY INTAKE:.    24-hr recall:  B ( AM):  Skips usually. OR eats :1/2  Chicken biscuit with hash brown and coffee Snk ( AM): eats other half of the breakast  Items. L ( PM): skipped D) Salmon, couscous and asparagus, Juice or water Snk ( PM): Bowl of Honey Nut Cherrios Beverages: water, juice,  Usual physical activity: works on   Estimated energy needs: 1500 calories 170 g carbohydrates 112 g protein 42 g fat  Progress Towards Goal(s):  In progress.   Nutritional Diagnosis:  NB-1.1 Food and nutrition-related knowledge deficit As related to Gastroparesis and GERD.  As evidenced by bloating, EGD results and relux symptoms..    Intervention:  Nutrition counseling on diet for gastroparesis and Reflux. Benefits of exercise, eating small,  frequent meals. Avoid spicy acidic and high fat foods. Watching fiber in diet and eating smaller amounts more often. Reviewed nutiriton  education handouts on Gastroparesis and GERD  Goals:  Eat 4-6 small meals per day  Avoid high fat and fried foods  Reduce fiber in diet  Increase fresh fruit and low carb vegetables in  diet.  Exercise 30 minutes 5 days a week for 150 minutes of exercise weekly.  Maintain weight.  Talk to PCP about using Miralax for constipation instead of Colace  Drink 4-5 bottles of water per day.  Follow the Meal Plan as discussed.   Teaching Method Utilized:  Visual Auditory Hands on  Handouts given during visit include:  Nutrition Therapy for Gastroparesis  Nutrition Therapy for GERD  Meal Plan Card  Barriers to learning/adherence to lifestyle change: none  Demonstrated degree of understanding via:  Teach Back   Monitoring/Evaluation:  Dietary intake, exercise, meal planning, SBG, and body weight in 1 month(s).

## 2014-09-17 NOTE — Patient Instructions (Signed)
Goals:  Eat 4-6 small meals per day  Avoid high fat and fried foods  Reduce fiber in diet  Increase fresh fruit and low carb vegetables in diet.  Exercise 30 minutes 5 days a week for 150 minutes of exercise weekly.  Maintain weight.  Talk to PCP about using Miralax for constipation instead of Colace  Drink 4-5 bottles of water per day.  Follow the Meal Plan as discussed.

## 2014-10-28 ENCOUNTER — Encounter: Payer: Self-pay | Admitting: Gastroenterology

## 2014-11-01 ENCOUNTER — Ambulatory Visit: Payer: 59 | Admitting: Nutrition

## 2014-11-11 ENCOUNTER — Encounter: Payer: Self-pay | Admitting: Gastroenterology

## 2014-11-19 ENCOUNTER — Encounter: Payer: 59 | Attending: Gastroenterology | Admitting: Nutrition

## 2014-11-19 VITALS — Ht 63.0 in | Wt 140.0 lb

## 2014-11-19 DIAGNOSIS — K3184 Gastroparesis: Secondary | ICD-10-CM

## 2014-11-19 DIAGNOSIS — K2100 Gastro-esophageal reflux disease with esophagitis, without bleeding: Secondary | ICD-10-CM

## 2014-11-19 NOTE — Progress Notes (Signed)
  Medical Nutrition Therapy:  Appt start time: 0800 end time:  0830.  Assessment:  Primary concerns today: Gastroparesis and Reflux.  Walks  2 mIles three times per week for exercise. Has problem with left leg with restless sydrome. Has started using Miralax daily to help with constipation. Gained 4 lbs since last visit. Has been eating more snacks between meals. Sometimes skips meals. Has been eating ice cream at night causing extra calorie consumption and therefore weight gain and higher blood sugars. Less bloated now.. Still hasn't worked on exercising as much as she wants to. Diet is high in processed foods, high fat and low in fresh fruits and vegetables. FBS 130-160's. Currently taking meds for reflux and gastroparesis. BM's are regular daily now and reflux is somewhat better. Drinks Crystal LIght and some water. Willing to increase water intake and cut out crystal light.  Preferred Learning Style:    Auditory  Visual  Hands on  Learning Readiness:    Ready  Change in progress  MEDICATIONS: See list   DIETARY INTAKE:.    24-hr recall:  B ( AM):  Coffee, yogurt, Snk ( AM): cheese and crackers  L ( PM): Skips sometimes  D) burger, fries, crystal light Snk ( PM): ice cream  Beverages: water,   Usual physical activity: working on joining a gym for exercise.  Estimated energy needs: 1500 calories 170 g carbohydrates 112 g protein 42 g fat  Progress Towards Goal(s):  In progress.   Nutritional Diagnosis:  NB-1.1 Food and nutrition-related knowledge deficit As related to Gastroparesis and GERD.  As evidenced by bloating, EGD results and relux symptoms..    Intervention:  Nutrition counseling on diet for gastroparesis and Reflux. Benefits of exercise, eating small,  frequent meals. Avoid spicy acidic and high fat foods. Watching fiber in diet and eating smaller amounts more often. Reviewed nutiriton  education handouts on Gastroparesis and GERD. Meal planning and  importance of balanced meals and increased fresh fruits and vegetables.  Goals:  Eat 3 small meals per day  Cut out crystal light and just drink water.. Reduce fat intake to aid in digestion. Increase fresh fruits and vegetables. Continue taking Miralax daily as needed to keep you bowels regular. Follow the Plate Method Avoid skipping meals Exercise 30-60 minutes most days of the week. Lose 2 lbs per month.  Teaching Method Utilized:  Visual Auditory Hands on  Handouts given during visit include:  Nutrition Therapy for Gastroparesis  Nutrition Therapy for GERD  Meal Plan Card  Barriers to learning/adherence to lifestyle change: none  Demonstrated degree of understanding via:  Teach Back   Monitoring/Evaluation:  Dietary intake, exercise, meal planning, SBG, and body weight in 3 month(s).

## 2014-11-19 NOTE — Patient Instructions (Signed)
Goals:  Eat 3 small meals per day  Cut out crystal light. Reduce fat intake Increase fresh fruits and vegetables. Continue taking Miralax daily as needed to keep you bowels regular. Follow the Plate Method Avoid skipping meals Exercise 30-60 minutes most days of the week. Lose 2-3 lbs per month.

## 2015-01-10 ENCOUNTER — Telehealth: Payer: Self-pay | Admitting: Gastroenterology

## 2015-01-10 ENCOUNTER — Ambulatory Visit: Payer: 59 | Admitting: Gastroenterology

## 2015-01-10 NOTE — Telephone Encounter (Signed)
Pt was a no show

## 2015-01-11 ENCOUNTER — Other Ambulatory Visit: Payer: Self-pay | Admitting: Adult Health

## 2015-01-11 DIAGNOSIS — Z1231 Encounter for screening mammogram for malignant neoplasm of breast: Secondary | ICD-10-CM

## 2015-01-17 ENCOUNTER — Telehealth: Payer: Self-pay | Admitting: Adult Health

## 2015-01-17 NOTE — Telephone Encounter (Signed)
Spoke with pt. Pt is having frequent urination and her bladder hurts with urination. I advised she would need to be seen. Pt voiced understanding. Call transferred to front desk for appt. [place]

## 2015-02-14 ENCOUNTER — Ambulatory Visit (HOSPITAL_COMMUNITY)
Admission: RE | Admit: 2015-02-14 | Discharge: 2015-02-14 | Disposition: A | Discharge status: Home / Self Care | Payer: 59 | Source: Ambulatory Visit | Attending: Adult Health | Admitting: Adult Health

## 2015-02-14 ENCOUNTER — Ambulatory Visit: Payer: 59 | Admitting: Nutrition

## 2015-02-14 ENCOUNTER — Ambulatory Visit (HOSPITAL_COMMUNITY): Payer: 59

## 2015-02-14 DIAGNOSIS — Z1231 Encounter for screening mammogram for malignant neoplasm of breast: Secondary | ICD-10-CM | POA: Insufficient documentation

## 2015-03-10 ENCOUNTER — Other Ambulatory Visit: Payer: Self-pay

## 2015-03-11 MED ORDER — DEXLANSOPRAZOLE 60 MG PO CPDR: 60.0000 mg | DELAYED_RELEASE_CAPSULE | Freq: Every day | ORAL | Status: DC

## 2015-06-08 ENCOUNTER — Other Ambulatory Visit (HOSPITAL_COMMUNITY): Payer: Self-pay | Admitting: Otolaryngology

## 2015-06-08 DIAGNOSIS — R221 Localized swelling, mass and lump, neck: Secondary | ICD-10-CM

## 2015-06-10 ENCOUNTER — Ambulatory Visit (HOSPITAL_COMMUNITY)
Admission: RE | Admit: 2015-06-10 | Discharge: 2015-06-10 | Disposition: A | Discharge status: Home / Self Care | Payer: 59 | Source: Ambulatory Visit | Attending: Otolaryngology | Admitting: Otolaryngology

## 2015-06-10 DIAGNOSIS — E041 Nontoxic single thyroid nodule: Secondary | ICD-10-CM | POA: Insufficient documentation

## 2015-06-10 DIAGNOSIS — R221 Localized swelling, mass and lump, neck: Secondary | ICD-10-CM | POA: Diagnosis present

## 2015-06-16 ENCOUNTER — Ambulatory Visit (HOSPITAL_COMMUNITY): Payer: 59

## 2015-07-04 DIAGNOSIS — M5432 Sciatica, left side: Secondary | ICD-10-CM | POA: Diagnosis not present

## 2015-07-20 DIAGNOSIS — E782 Mixed hyperlipidemia: Secondary | ICD-10-CM | POA: Diagnosis not present

## 2015-07-20 DIAGNOSIS — J301 Allergic rhinitis due to pollen: Secondary | ICD-10-CM | POA: Diagnosis not present

## 2015-07-20 DIAGNOSIS — Z9189 Other specified personal risk factors, not elsewhere classified: Secondary | ICD-10-CM | POA: Diagnosis not present

## 2015-07-20 DIAGNOSIS — Z0001 Encounter for general adult medical examination with abnormal findings: Secondary | ICD-10-CM | POA: Diagnosis not present

## 2015-07-20 DIAGNOSIS — E559 Vitamin D deficiency, unspecified: Secondary | ICD-10-CM | POA: Diagnosis not present

## 2015-07-20 DIAGNOSIS — Z1389 Encounter for screening for other disorder: Secondary | ICD-10-CM | POA: Diagnosis not present

## 2015-07-20 DIAGNOSIS — I10 Essential (primary) hypertension: Secondary | ICD-10-CM | POA: Diagnosis not present

## 2015-07-20 DIAGNOSIS — K219 Gastro-esophageal reflux disease without esophagitis: Secondary | ICD-10-CM | POA: Diagnosis not present

## 2015-07-20 DIAGNOSIS — G43909 Migraine, unspecified, not intractable, without status migrainosus: Secondary | ICD-10-CM | POA: Diagnosis not present

## 2015-07-25 MED FILL — VIT D2 1.25 MG (50,000 UNIT: 1.25 MG | 13 days supply | Qty: 4 | Fill #2

## 2015-07-25 MED FILL — POLYETHYLENE GLYCOL 3350 PO: 30 days supply | Qty: 1054 | Fill #0

## 2015-08-11 MED FILL — METOPROLOL SUCC ER 25 MG TA: 25 | 90 days supply | Qty: 180 | Fill #1

## 2015-08-19 DIAGNOSIS — E041 Nontoxic single thyroid nodule: Secondary | ICD-10-CM | POA: Diagnosis not present

## 2015-08-29 MED FILL — DEXILANT DR 60 MG CAPSULE: 60 | 90 days supply | Qty: 90 | Fill #2

## 2015-08-29 MED FILL — SM COMPLETE 50+ TABLET: 125 days supply | Qty: 125 | Fill #2

## 2015-09-01 ENCOUNTER — Other Ambulatory Visit (HOSPITAL_COMMUNITY): Payer: Self-pay | Admitting: Family Medicine

## 2015-09-01 DIAGNOSIS — R221 Localized swelling, mass and lump, neck: Secondary | ICD-10-CM

## 2015-09-06 ENCOUNTER — Ambulatory Visit (HOSPITAL_COMMUNITY)
Admission: RE | Admit: 2015-09-06 | Discharge: 2015-09-06 | Disposition: A | Discharge status: Home / Self Care | Payer: 59 | Source: Ambulatory Visit | Attending: Family Medicine | Admitting: Family Medicine

## 2015-09-06 DIAGNOSIS — E041 Nontoxic single thyroid nodule: Secondary | ICD-10-CM | POA: Diagnosis not present

## 2015-09-06 DIAGNOSIS — R221 Localized swelling, mass and lump, neck: Secondary | ICD-10-CM

## 2015-09-06 MED ORDER — LIDOCAINE HCL (PF) 2 % IJ SOLN
INTRAMUSCULAR | Status: AC
  Administered 2015-09-06: 10 mL
  Filled 2015-09-06: qty 10

## 2015-09-06 NOTE — Discharge Instructions (Signed)

## 2015-09-06 NOTE — Procedures (Signed)
PreOperative Dx: LT thyroid nodule Postoperative Dx: LT thyroid nodule Procedure:   US guided FNA of LT thyroid nodule Radiologist:  Thornton Papas Anesthesia:  2 ml of 2% lidocaine Specimen:  FNA x 4  EBL:   < 1 ml Complications: None

## 2015-09-07 ENCOUNTER — Ambulatory Visit (HOSPITAL_COMMUNITY): Payer: 59

## 2015-09-07 MED FILL — RIZATRIPTAN 10 MG TABLET: 10 | 30 days supply | Qty: 9 | Fill #0

## 2015-10-20 ENCOUNTER — Encounter: Payer: 59 | Attending: Family Medicine | Admitting: Nutrition

## 2015-10-20 VITALS — Ht 64.0 in | Wt 149.6 lb

## 2015-10-20 DIAGNOSIS — K2100 Gastro-esophageal reflux disease with esophagitis, without bleeding: Secondary | ICD-10-CM

## 2015-10-20 DIAGNOSIS — Z713 Dietary counseling and surveillance: Secondary | ICD-10-CM | POA: Insufficient documentation

## 2015-10-20 DIAGNOSIS — K3184 Gastroparesis: Secondary | ICD-10-CM

## 2015-10-20 MED FILL — POLYETHYLENE GLYCOL 3350 PO: 30 days supply | Qty: 1054 | Fill #1

## 2015-10-20 MED FILL — FLUTICASONE PROP 50 MCG SPR: 50 | 90 days supply | Qty: 48 | Fill #3

## 2015-10-20 NOTE — Progress Notes (Signed)
  Medical Nutrition Therapy:  Appt start time: 0800 end time:  0830.  Assessment:  Primary concerns today: Gastroparesis and Reflux. Changes made:Hassn't eating past 5 or 6 pm and feels a lot better. She is taking her Mirlax every other day and her bowels are much more regular. She is not bloated as much. She has tried to stop eating most foods of dairy due to possible lactose intolerance, but continues to eat ice cream.  She admits it does make her bloated. She has been eating ice cream most nights and snacks thorough out the day but not formal meals. Eating healthier snacks of carrots and celery. Gained 9 lbs since last visit 6 months ago.Marland Kitchen Hasn't been exercising. 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 She is taking her meds for reflux. Diet is excessive in calories, fat and low in fresh fruits, vegetables and high fiber foods. Needs to cut out eating fast foods which are causing weight gain along with not exercising.  Preferred Learning Style:    Auditory  Visual  Hands on  Learning Readiness:    Ready  Change in progress  MEDICATIONS: See list   DIETARY INTAKE:.    24-hr recall:  B ( AM):  Coffee, yogurt, Snk ( AM): cheese and crackers  L ( PM): Skips sometimes  D) burger, fries, crystal light Snk ( PM): ice cream  Beverages: water,   Usual physical activity: working on joining a gym for exercise.  Estimated energy needs: 1500 calories 170 g carbohydrates 112 g protein 42 g fat  Progress Towards Goal(s):  In progress.   Nutritional Diagnosis:  NB-1.1 Food and nutrition-related knowledge deficit As related to Gastroparesis and GERD.  As evidenced by bloating, EGD results and relux symptoms..    Intervention:  Nutrition counseling on diet for gastroparesis and Reflux. Benefits of exercise, eating small,  frequent meals. Avoid spicy acidic and high fat foods. Watching fiber in diet  and eating smaller amounts more often. Reviewed nutiriton  education handouts on Gastroparesis and GERD. Meal planning and importance of balanced meals and increased fresh fruits and vegetables.  Goals:  Eat 3 small meals per day an nothing past 7 pm. Drink water only. Cut out ice cream and fried foods. Increase fresh fruits and vegetables. Continue taking Miralax daily as needed to keep you bowels regular. Follow the Plate Method Avoid skipping meals Exercise 30-60 minutes most days of the week. Lose 2 lbs per month.  Teaching Method Utilized:  Visual Auditory Hands on  Handouts given during visit include:  Nutrition Therapy for Gastroparesis  Nutrition Therapy for GERD  Meal Plan Card  Barriers to learning/adherence to lifestyle change: none  Demonstrated degree of understanding via:  Teach Back   Monitoring/Evaluation:  Dietary intake, exercise, meal planning, SBG, and body weight in 3 month(s).

## 2015-10-24 NOTE — Patient Instructions (Signed)
Goals:  Eat 3 small meals per day an nothing past 7 pm. Drink water only. Cut out ice cream and fried foods. Increase fresh fruits and vegetables. Continue taking Miralax daily as needed to keep you bowels regular. Follow the Plate Method Avoid skipping meals Exercise 30-60 minutes most days of the week. Lose 2 lbs per month.

## 2015-11-03 MED FILL — METOPROLOL SUCC ER 25 MG TA: 25 | 90 days supply | Qty: 180 | Fill #2

## 2015-11-19 DIAGNOSIS — R300 Dysuria: Secondary | ICD-10-CM | POA: Diagnosis not present

## 2015-12-21 MED FILL — POLYETHYLENE GLYCOL 3350 PO: 30 days supply | Qty: 1054 | Fill #2

## 2015-12-26 DIAGNOSIS — N390 Urinary tract infection, site not specified: Secondary | ICD-10-CM | POA: Diagnosis not present

## 2016-01-04 MED FILL — SM COMPLETE 50+ TABLET: 125 days supply | Qty: 125 | Fill #0

## 2016-01-04 MED FILL — DEXILANT DR 60 MG CAPSULE: 60 | 90 days supply | Qty: 90 | Fill #3

## 2016-01-12 MED FILL — FLUTICASONE PROP 50 MCG SPR: 50 | 90 days supply | Qty: 48 | Fill #0

## 2016-01-17 DIAGNOSIS — Z6825 Body mass index (BMI) 25.0-25.9, adult: Secondary | ICD-10-CM | POA: Diagnosis not present

## 2016-01-17 DIAGNOSIS — K219 Gastro-esophageal reflux disease without esophagitis: Secondary | ICD-10-CM | POA: Diagnosis not present

## 2016-01-17 DIAGNOSIS — J301 Allergic rhinitis due to pollen: Secondary | ICD-10-CM | POA: Diagnosis not present

## 2016-01-17 DIAGNOSIS — E782 Mixed hyperlipidemia: Secondary | ICD-10-CM | POA: Diagnosis not present

## 2016-01-17 DIAGNOSIS — Z0001 Encounter for general adult medical examination with abnormal findings: Secondary | ICD-10-CM | POA: Diagnosis not present

## 2016-01-17 DIAGNOSIS — G43909 Migraine, unspecified, not intractable, without status migrainosus: Secondary | ICD-10-CM | POA: Diagnosis not present

## 2016-01-17 DIAGNOSIS — I10 Essential (primary) hypertension: Secondary | ICD-10-CM | POA: Diagnosis not present

## 2016-01-17 DIAGNOSIS — E559 Vitamin D deficiency, unspecified: Secondary | ICD-10-CM | POA: Diagnosis not present

## 2016-01-18 ENCOUNTER — Other Ambulatory Visit (HOSPITAL_COMMUNITY): Payer: Self-pay | Admitting: Family Medicine

## 2016-01-18 DIAGNOSIS — Z1231 Encounter for screening mammogram for malignant neoplasm of breast: Secondary | ICD-10-CM

## 2016-01-20 ENCOUNTER — Other Ambulatory Visit (HOSPITAL_COMMUNITY): Payer: Self-pay | Admitting: Family Medicine

## 2016-01-20 DIAGNOSIS — Z1231 Encounter for screening mammogram for malignant neoplasm of breast: Secondary | ICD-10-CM

## 2016-01-30 MED FILL — METOPROLOL SUCC ER 25 MG TA: 25 | 90 days supply | Qty: 180 | Fill #0

## 2016-01-31 MED FILL — RIZATRIPTAN 10 MG TABLET: 10 | 30 days supply | Qty: 9 | Fill #0

## 2016-02-11 DIAGNOSIS — Z6826 Body mass index (BMI) 26.0-26.9, adult: Secondary | ICD-10-CM | POA: Diagnosis not present

## 2016-02-11 DIAGNOSIS — R300 Dysuria: Secondary | ICD-10-CM | POA: Diagnosis not present

## 2016-02-13 ENCOUNTER — Emergency Department (HOSPITAL_COMMUNITY)
Admission: EM | Admit: 2016-02-13 | Discharge: 2016-02-13 | Disposition: A | Discharge status: Home / Self Care | Payer: 59 | Attending: Emergency Medicine | Admitting: Emergency Medicine

## 2016-02-13 ENCOUNTER — Encounter (HOSPITAL_COMMUNITY): Payer: Self-pay

## 2016-02-13 ENCOUNTER — Emergency Department (HOSPITAL_COMMUNITY): Payer: 59

## 2016-02-13 DIAGNOSIS — Y93G3 Activity, cooking and baking: Secondary | ICD-10-CM | POA: Insufficient documentation

## 2016-02-13 DIAGNOSIS — S6992XA Unspecified injury of left wrist, hand and finger(s), initial encounter: Secondary | ICD-10-CM | POA: Insufficient documentation

## 2016-02-13 DIAGNOSIS — Y999 Unspecified external cause status: Secondary | ICD-10-CM | POA: Insufficient documentation

## 2016-02-13 DIAGNOSIS — Y9289 Other specified places as the place of occurrence of the external cause: Secondary | ICD-10-CM | POA: Diagnosis not present

## 2016-02-13 DIAGNOSIS — X509XXA Other and unspecified overexertion or strenuous movements or postures, initial encounter: Secondary | ICD-10-CM | POA: Insufficient documentation

## 2016-02-13 DIAGNOSIS — M79642 Pain in left hand: Secondary | ICD-10-CM | POA: Diagnosis not present

## 2016-02-13 DIAGNOSIS — M7989 Other specified soft tissue disorders: Secondary | ICD-10-CM | POA: Diagnosis not present

## 2016-02-13 NOTE — ED Notes (Signed)
Patient with no complaints at this time. Respirations even and unlabored. Skin warm/dry. Discharge instructions reviewed with patient at this time. Patient given opportunity to voice concerns/ask questions. Patient discharged at this time and left Emergency Department with steady gait.   

## 2016-02-13 NOTE — ED Triage Notes (Signed)
Patient states her hand twisted while holding a pot of food. Pain to left hand with swelling noted to middle metacarpal.

## 2016-02-13 NOTE — Discharge Instructions (Signed)
You were seen in the emergency department for left hand injury. Your x-ray shows no fracture or dislocation. We are placing you in a splint for stability, comfort and will have you follow-up with orthopedic physician. Please keep the splint clean and dry. I recommend you keep your arm elevated at all times when at rest to help with pain and swelling.  You may use Tylenol 1000 mg every 6 hours as needed for pain.

## 2016-02-13 NOTE — ED Provider Notes (Addendum)
TIME SEEN: 6:00 AM  CHIEF COMPLAINT: Left hand pain  HPI: Pt is a 55 y.o. female who is right-hand-dominant who presents to the emergency department with left hand pain. States she was cooking food and went to turn a pot with her hand to empty water from it and felt immediate pain and states that she thought she saw the bones in the left dorsal hand shift. States she had to push on her hand to "put them back in place". Has had pain and swelling this area since and now pain radiates up into her arm to the elbow. No numbness or focal weakness. Has been putting ice on her hand with some relief.  ROS: See HPI Constitutional: no fever  Eyes: no drainage  ENT: no runny nose   Cardiovascular:  no chest pain  Resp: no SOB  GI: no vomiting GU: no dysuria Integumentary: no rash  Allergy: no hives  Musculoskeletal: no leg swelling  Neurological: no slurred speech ROS otherwise negative  PAST MEDICAL HISTORY/PAST SURGICAL HISTORY:  Past Medical History:  Diagnosis Date  . Allergic rhinitis   . Fibroids   . Irregular heart beat   . Migraines   . Perimenopause 03/09/2014  . Reflux     MEDICATIONS:  Prior to Admission medications   Medication Sig Start Date End Date Taking? Authorizing Provider  dexlansoprazole (DEXILANT) 60 MG capsule Take 1 capsule (60 mg total) by mouth daily. 03/11/15   Carlis Stable, NP  docusate sodium (COLACE) 100 MG capsule Take 100 mg by mouth at bedtime.    Historical Provider, MD  fluticasone (FLONASE) 50 MCG/ACT nasal spray Place into both nostrils as needed for allergies or rhinitis.    Historical Provider, MD  loratadine (CLARITIN) 10 MG tablet Take 10 mg by mouth daily.    Historical Provider, MD  metoprolol tartrate (LOPRESSOR) 25 MG tablet Take 25 mg by mouth 2 (two) times daily.    Historical Provider, MD  Multiple Vitamin (MULTIVITAMIN) tablet Take 1 tablet by mouth daily.    Historical Provider, MD  ondansetron (ZOFRAN) 4 MG tablet Take 1 tablet (4 mg total) by  mouth every 4 (four) hours as needed for nausea or vomiting. Patient not taking: Reported on 09/17/2014 07/14/14   Danie Binder, MD  Polyethylene Glycol 3350 (MIRALAX PO) Take 17 g by mouth daily.    Historical Provider, MD  rizatriptan (MAXALT-MLT) 10 MG disintegrating tablet Take 10 mg by mouth as needed for migraine. May repeat in 2 hours if needed    Historical Provider, MD  Vitamin D, Ergocalciferol, (DRISDOL) 50000 UNITS CAPS capsule Take 50,000 Units by mouth every 7 (seven) days.    Historical Provider, MD    ALLERGIES:  Allergies  Allergen Reactions  . Flagyl [Metronidazole] Nausea And Vomiting  . Lactose Intolerance (Gi)   . Shellfish Allergy   . Topamax [Topiramate] Other (See Comments)  . Ciprofloxacin Hcl Nausea Only  . Diflucan [Fluconazole] Nausea And Vomiting    SOCIAL HISTORY:  Social History  Substance Use Topics  . Smoking status: Never Smoker  . Smokeless tobacco: Never Used  . Alcohol use No    FAMILY HISTORY: Family History  Problem Relation Age of Onset  . Other Mother     glaucoma; pacemaker  . Hypertension Mother   . Other Father     aneursym  . Cancer Sister     biliary, deceased age 2  . Hypertension Sister   . Cancer Brother  lung  . Hypertension Brother   . Sleep apnea Brother   . Hypertension Brother   . Hypertension Brother   . Hypertension Sister   . Hypertension Sister   . Hypertension Sister   . Colon cancer Neg Hx     EXAM: BP 147/86 (BP Location: Left Arm)   Pulse 84   Temp 97.5 F (36.4 C) (Oral)   Resp 18   Ht 5\' 4"  (1.626 m)   Wt 149 lb (67.6 kg)   LMP 01/05/2016   SpO2 100%   BMI 25.58 kg/m  CONSTITUTIONAL: Alert and oriented and responds appropriately to questions. Well-appearing; well-nourished HEAD: Normocephalic EYES: Conjunctivae clear, PERRL ENT: normal nose; no rhinorrhea; moist mucous membranes NECK: Supple, no meningismus, no LAD  CARD: RRR; S1 and S2 appreciated; no murmurs, no clicks, no rubs, no  gallops RESP: Normal chest excursion without splinting or tachypnea; breath sounds clear and equal bilaterally; no wheezes, no rhonchi, no rales, no hypoxia or respiratory distress, speaking full sentences ABD/GI: Normal bowel sounds; non-distended; soft, non-tender, no rebound, no guarding, no peritoneal signs BACK:  The back appears normal and is non-tender to palpation, there is no CVA tenderness EXT: Patient is tender to palpation over the left dorsal hand with associated swelling especially over the third MCP. No obvious sign of dislocation on exam. 2+ radial pulse on the left. No erythema or warmth. Patient is able to fully extend her fingers but has difficulty making a fist because of pain. No tenderness over the scaphoid. Normal ROM in all joints; otherwise external views are non-tender to palpation; no edema; normal capillary refill; no cyanosis, no calf tenderness or swelling, compartments are soft, no joint effusion    SKIN: Normal color for age and race; warm; no rash NEURO: Moves all extremities equally, sensation to light touch intact diffusely, cranial nerves II through XII intact PSYCH: The patient's mood and manner are appropriate. Grooming and personal hygiene are appropriate.  MEDICAL DECISION MAKING: Patient here with left hand injury. No sign of gout, septic arthritis on exam. Neurovascular intact distally. X-rays show no fracture or dislocation. She states whenever she feels like she has to make a fist she feels like something is popping out of place in her dorsal hand. No sign of dislocation when she does make a fist. We'll place her in a splint for comfort and stability in case there was MCP dislocation and have her follow-up with orthopedics as an outpatient. Recommended continuing Tylenol for pain. Have offered start pain medication which she declines. Recommended elevation, ice.   At this time, I do not feel there is any life-threatening condition present. I have reviewed and  discussed all results (EKG, imaging, lab, urine as appropriate), exam findings with patient/family. I have reviewed nursing notes and appropriate previous records.  I feel the patient is safe to be discharged home without further emergent workup and can continue workup as an outpatient. Discussed usual and customary return precautions. Patient/family verbalize understanding and are comfortable with this plan.  Outpatient follow-up has been provided. All questions have been answered.    SPLINT APPLICATION Date/Time: 123XX123 AM Authorized by: Nyra Jabs Consent: Verbal consent obtained. Risks and benefits: risks, benefits and alternatives were discussed Consent given by: patient Splint applied by: Adella Hare Location details: left hand Splint type: ulnar gutter placed but 5 inch used to cover all of the left hand to mid fingers with MCP in extension Supplies used: fiberglass Post-procedure: The splinted body part was neurovascularly  unchanged following the procedure. Patient tolerance: Patient tolerated the procedure well with no immediate complications.         [place], DO 02/13/16 [place], DO 02/13/16 [phone]

## 2016-02-16 ENCOUNTER — Ambulatory Visit (HOSPITAL_COMMUNITY)
Admission: RE | Admit: 2016-02-16 | Discharge: 2016-02-16 | Disposition: A | Discharge status: Home / Self Care | Payer: 59 | Source: Ambulatory Visit | Attending: Family Medicine | Admitting: Family Medicine

## 2016-02-16 ENCOUNTER — Ambulatory Visit (HOSPITAL_COMMUNITY): Payer: 59

## 2016-02-16 DIAGNOSIS — S63659A Sprain of metacarpophalangeal joint of unspecified finger, initial encounter: Secondary | ICD-10-CM | POA: Insufficient documentation

## 2016-02-16 DIAGNOSIS — Z1231 Encounter for screening mammogram for malignant neoplasm of breast: Secondary | ICD-10-CM | POA: Diagnosis not present

## 2016-02-16 DIAGNOSIS — M79642 Pain in left hand: Secondary | ICD-10-CM | POA: Insufficient documentation

## 2016-02-24 ENCOUNTER — Other Ambulatory Visit: Payer: 59 | Admitting: Adult Health

## 2016-03-08 DIAGNOSIS — S63659D Sprain of metacarpophalangeal joint of unspecified finger, subsequent encounter: Secondary | ICD-10-CM | POA: Diagnosis not present

## 2016-03-12 DIAGNOSIS — S63659A Sprain of metacarpophalangeal joint of unspecified finger, initial encounter: Secondary | ICD-10-CM | POA: Diagnosis not present

## 2016-03-12 DIAGNOSIS — S63659D Sprain of metacarpophalangeal joint of unspecified finger, subsequent encounter: Secondary | ICD-10-CM | POA: Diagnosis not present

## 2016-03-14 MED FILL — RIZATRIPTAN 10 MG TABLET: 10 | 30 days supply | Qty: 9 | Fill #1

## 2016-03-23 ENCOUNTER — Other Ambulatory Visit: Payer: Self-pay

## 2016-03-23 MED ORDER — DEXLANSOPRAZOLE 60 MG PO CPDR: 60.0000 mg | DELAYED_RELEASE_CAPSULE | Freq: Every day | ORAL | 3 refills | Status: DC

## 2016-03-23 MED FILL — DEXILANT DR 60 MG CAPSULE: 60 | 90 days supply | Qty: 90 | Fill #0

## 2016-04-02 ENCOUNTER — Ambulatory Visit: Payer: 59 | Admitting: Nutrition

## 2016-04-05 DIAGNOSIS — S63659D Sprain of metacarpophalangeal joint of unspecified finger, subsequent encounter: Secondary | ICD-10-CM | POA: Diagnosis not present

## 2016-04-05 DIAGNOSIS — S63659A Sprain of metacarpophalangeal joint of unspecified finger, initial encounter: Secondary | ICD-10-CM | POA: Diagnosis not present

## 2016-04-13 ENCOUNTER — Telehealth: Payer: Self-pay | Admitting: Adult Health

## 2016-04-13 NOTE — Telephone Encounter (Signed)
Spoke with pt. Pt is having some vaginal irritation and would like a cream ordered. Pt is at work so can't come in. Thanks!! Rose Delgado

## 2016-04-13 NOTE — Telephone Encounter (Signed)
Pt called stating that she would like for Anderson Malta to call her in a vaginal cream. Pt states that she is having some irritation. Please contact pt

## 2016-04-13 NOTE — Telephone Encounter (Signed)
Spoke with pt letting her know JAG can't order any meds without her being seen. Pt hasn't been seen in 2 years. I advised Monistat if she thought it was yeast and pt states she just finished that. Pt states she will see someone tomorrow. Pt voiced understanding. [place]

## 2016-04-19 ENCOUNTER — Encounter: Payer: Self-pay | Admitting: Women's Health

## 2016-04-19 ENCOUNTER — Ambulatory Visit (INDEPENDENT_AMBULATORY_CARE_PROVIDER_SITE_OTHER): Payer: 59 | Admitting: Women's Health

## 2016-04-19 VITALS — BP 140/70 | HR 74 | Ht 63.0 in | Wt 152.0 lb

## 2016-04-19 DIAGNOSIS — B373 Candidiasis of vulva and vagina: Secondary | ICD-10-CM

## 2016-04-19 DIAGNOSIS — M545 Low back pain: Secondary | ICD-10-CM

## 2016-04-19 DIAGNOSIS — L292 Pruritus vulvae: Secondary | ICD-10-CM | POA: Diagnosis not present

## 2016-04-19 DIAGNOSIS — B3731 Acute candidiasis of vulva and vagina: Secondary | ICD-10-CM

## 2016-04-19 LAB — POCT WET PREP (WET MOUNT)
Clue Cells Wet Prep Whiff POC: NEGATIVE
Trichomonas Wet Prep HPF POC: ABSENT

## 2016-04-19 MED ORDER — TERCONAZOLE 0.4 % VA CREA: 1.0000 | TOPICAL_CREAM | Freq: Every day | VAGINAL | 0 refills | Status: DC

## 2016-04-19 NOTE — Progress Notes (Signed)
   [facility] Visit  Patient name: Rose Delgado MRN [id number]  Date of birth: 1960/07/08  CC & HPI:  Rose Delgado is a 55 y.o. G0P0 African American female presenting today for report of vulvar itching, tried monistat and it helped, but not completely resolved. New sexual relationship x 6-3mths and has had a 'lot of problems' since. 'Kidney pain', denies frequency/urgency/dysuria. Offered STD screen, wants gc/ct, not anything else at this time.  Patient's last menstrual period was 04/07/2016 (approximate).  Last pap 2014  Pertinent History Reviewed:  Medical & Surgical Hx:   Past medical, surgical, family, and social history reviewed in electronic medical record Medications: Reviewed & Updated - see associated section Allergies: Reviewed in electronic medical record  Objective Findings:  Vitals: BP 140/70 (BP Location: Left Arm, Patient Position: Sitting, Cuff Size: Normal)   Pulse 74   Ht 5\' 3"  (1.6 m)   Wt 152 lb (68.9 kg)   LMP 04/07/2016 (Approximate)   BMI 26.93 kg/m  Body mass index is 26.93 kg/m.  Physical Examination: General appearance - alert, well appearing, and in no distress Pelvic - normal external genitalia, thick clumpy d/c in vaginal/adherent to walls Back: no CVAT   Urine +blood, leuks  Results for orders placed or performed in visit on 04/19/16 (from the past 24 hour(s))  POCT Wet Prep Lenard Forth [place])   Collection Time: 04/19/16  3:32 PM  Result Value Ref Range   Source Wet Prep POC vaginal    WBC, Wet Prep HPF POC few    Bacteria Wet Prep HPF POC None None, Few, Too numerous to count   BACTERIA WET PREP MORPHOLOGY POC     Clue Cells Wet Prep HPF POC None None, Too numerous to count   Clue Cells Wet Prep Whiff POC Negative Whiff    Yeast Wet Prep HPF POC Many    KOH Wet Prep POC     Trichomonas Wet Prep HPF POC Absent Absent     Assessment & Plan:  A:   Vulvovaginal candida  Back/kidney pain  P:  Rx terazole (allergic to  diflucan), takes monistat w/o problems but it did not clear yeast infection  GC/CT from urine, send urine cx  Return for w/in 3mth for pap & physical .  Tawnya Crook CNM, [facility] 04/19/2016 3:33 PM

## 2016-04-21 LAB — URINE CULTURE: Organism ID, Bacteria: NO GROWTH

## 2016-04-22 LAB — GC/CHLAMYDIA PROBE AMP
Chlamydia trachomatis, NAA: NEGATIVE
Neisseria gonorrhoeae by PCR: NEGATIVE

## 2016-05-10 ENCOUNTER — Ambulatory Visit (INDEPENDENT_AMBULATORY_CARE_PROVIDER_SITE_OTHER): Payer: 59 | Admitting: Women's Health

## 2016-05-10 ENCOUNTER — Other Ambulatory Visit (HOSPITAL_COMMUNITY)
Admission: RE | Admit: 2016-05-10 | Discharge: 2016-05-10 | Disposition: A | Discharge status: Home / Self Care | Payer: 59 | Source: Ambulatory Visit | Attending: Obstetrics & Gynecology | Admitting: Obstetrics & Gynecology

## 2016-05-10 ENCOUNTER — Other Ambulatory Visit: Payer: Self-pay | Admitting: Women's Health

## 2016-05-10 ENCOUNTER — Encounter: Payer: Self-pay | Admitting: Women's Health

## 2016-05-10 VITALS — BP 130/70 | HR 64 | Ht 63.5 in | Wt 147.0 lb

## 2016-05-10 DIAGNOSIS — Z1151 Encounter for screening for human papillomavirus (HPV): Secondary | ICD-10-CM | POA: Insufficient documentation

## 2016-05-10 DIAGNOSIS — Z01411 Encounter for gynecological examination (general) (routine) with abnormal findings: Secondary | ICD-10-CM

## 2016-05-10 DIAGNOSIS — D259 Leiomyoma of uterus, unspecified: Secondary | ICD-10-CM

## 2016-05-10 DIAGNOSIS — Z01419 Encounter for gynecological examination (general) (routine) without abnormal findings: Secondary | ICD-10-CM | POA: Diagnosis not present

## 2016-05-10 NOTE — Progress Notes (Signed)
Subjective:   Rose Delgado is a 54 y.o. G0P0 African American female here for a routine well-woman exam.  Patient's last menstrual period was 04/02/2016.   Still having regular periods. Is sexually active, not using contraception. Does not want pregnancy. Was never able to get pregnant when she was trying, even w/ IVF. Declines contraception at this time. Does have h/o uterine fibroids. Current complaints: none. Yeast infection gone. No longer having kidney pain PCP: Dr. 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  Past Medical History Past Medical History:  Diagnosis Date  . Allergic rhinitis   . Fibroids   . Irregular heart beat   . Migraines   . Perimenopause 03/09/2014  . Reflux     Past Surgical History Past Surgical History:  Procedure Laterality Date  . BREAST BIOPSY Left 2008   benign  . COLONOSCOPY  06/2011   Dr. Britta Mccreedy: per patient it was normal, follow up in 10 years.   . ESOPHAGOGASTRODUODENOSCOPY     Dr. Berneta Sages: late 1990s/early 2000  . ESOPHAGOGASTRODUODENOSCOPY N/A 05/19/2014   SLF:NO OBVIOUS SOURCE FOR DYSGEUSIA IDENTIFED/MILD Non-erosive gastritis  . INGUINAL HERNIA REPAIR    . KNEE SURGERY Right   . TONSILLECTOMY AND ADENOIDECTOMY      Gynecologic History G0P0  Patient's last menstrual period was 04/02/2016. Contraception: none Last Pap: 2014. Results were: normal Last mammogram: 2017. Results were: normal Last TCS: 2015, normal  Obstetric  History OB History  Gravida Para Term Preterm AB Living  0            SAB TAB Ectopic Multiple Live Births                   Current Medications Current Outpatient Prescriptions on File Prior to Visit  Medication Sig Dispense Refill  . cetirizine (ZYRTEC) 10 MG tablet Take 10 mg by mouth daily.    . cholecalciferol (VITAMIN D) 1000 units tablet Take 1,000 Units by mouth daily.    Marland Kitchen dexlansoprazole (DEXILANT) 60 MG capsule Take 1 capsule (60 mg total) by mouth daily. 90 capsule 3  . fluticasone (FLONASE) 50 MCG/ACT nasal spray Place into both nostrils as needed for allergies or rhinitis.    . metoprolol tartrate (LOPRESSOR) 25 MG tablet Take 12.5 mg by mouth 2 (two) times daily.     . Multiple Vitamin (MULTIVITAMIN) tablet Take 1 tablet by mouth daily.    . ondansetron (ZOFRAN) 4 MG tablet Take 1 tablet (4 mg total) by mouth every 4 (four) hours as needed for nausea or vomiting. 30 tablet 2  . Polyethylene Glycol 3350 (MIRALAX PO) Take 17 g by mouth daily.    . rizatriptan (MAXALT-MLT) 10 MG disintegrating tablet Take 10 mg by mouth as needed for migraine. May repeat in 2 hours if needed    . terconazole (TERAZOL 7) 0.4 % vaginal cream Place 1 applicator vaginally at bedtime. X 7 nights 45 g 0   No current facility-administered medications on file prior to visit.     Review of Systems Patient  denies any headaches, blurred vision, shortness of breath, chest pain, abdominal pain, problems with bowel movements, urination, or intercourse.  Objective:  BP 130/70 (BP Location: Right Arm, Patient Position: Sitting, Cuff Size: Normal)   Pulse 64   Ht 5' 3.5" (1.613 m)   Wt 147 lb (66.7 kg)   LMP 04/02/2016   BMI 25.63 kg/m  Physical Exam  General:  Well developed, well nourished, no acute distress. She is alert and oriented x3. Skin:  Warm and dry Neck:  Midline trachea, no thyromegaly or nodules Cardiovascular: Regular rate and rhythm, no murmur heard Lungs:  Effort normal, all  lung fields clear to auscultation bilaterally Breasts:  No dominant palpable mass, retraction, or nipple discharge Abdomen:  Soft, non tender, no hepatosplenomegaly or masses Pelvic:  External genitalia is normal in appearance.  The vagina is normal in appearance. The cervix is bulbous, nulliparous, no CMT.  Thin prep pap is done w/ HR HPV cotesting. Uterus is felt to be slightly enlarged..  No adnexal masses or tenderness noted. Extremities:  No swelling or varicosities noted Psych:  She has a normal mood and affect  Assessment:   Healthy well-woman exam Infertile Known uterine fibroids  Plan:  Plan pelvic u/s asap (just came off period) to evaluate uterine fibroids, then f/u w/ me Mammogram next year, or sooner if problems Colonoscopy per GI, or sooner if problems  Tawnya Crook CNM, [facility]NP-BC 05/10/2016 9:05 AM

## 2016-05-10 NOTE — Patient Instructions (Signed)
[email or web address]

## 2016-05-11 LAB — CYTOLOGY - PAP
Diagnosis: NEGATIVE
HPV: NOT DETECTED

## 2016-05-14 ENCOUNTER — Other Ambulatory Visit: Payer: 59

## 2016-05-14 ENCOUNTER — Ambulatory Visit (INDEPENDENT_AMBULATORY_CARE_PROVIDER_SITE_OTHER): Payer: 59

## 2016-05-14 DIAGNOSIS — D250 Submucous leiomyoma of uterus: Secondary | ICD-10-CM

## 2016-05-14 DIAGNOSIS — N854 Malposition of uterus: Secondary | ICD-10-CM | POA: Diagnosis not present

## 2016-05-14 DIAGNOSIS — D252 Subserosal leiomyoma of uterus: Secondary | ICD-10-CM | POA: Diagnosis not present

## 2016-05-14 DIAGNOSIS — D259 Leiomyoma of uterus, unspecified: Secondary | ICD-10-CM

## 2016-05-14 MED FILL — SM COMPLETE 50+ TABLET: 125 days supply | Qty: 125 | Fill #1

## 2016-05-14 NOTE — Progress Notes (Signed)
PELVIC US TA/TV: Heterogeneous anteverted uterus w/mult.fibroids,largest fibroids: (#1) subserosal fundal left 6.4 x 6.4 x 4.6 cm,(#2)subserosal fundal right 3 x 2.1 x 3 cm, EEC 7.1 mm,endometrium appears distorted by the post.submucosal  fibroid (#3) 1.4 x 7 x 1.4 cm ,normal ov's bilat,no pain,no free fluid seen

## 2016-05-21 MED FILL — METOPROLOL SUCC ER 25 MG TA: 25 | 90 days supply | Qty: 180 | Fill #1

## 2016-05-21 MED FILL — RIZATRIPTAN 10 MG TABLET: 10 | 30 days supply | Qty: 9 | Fill #2

## 2016-05-28 ENCOUNTER — Encounter: Payer: Self-pay | Admitting: Women's Health

## 2016-05-28 ENCOUNTER — Ambulatory Visit (INDEPENDENT_AMBULATORY_CARE_PROVIDER_SITE_OTHER): Payer: 59 | Admitting: Women's Health

## 2016-05-28 VITALS — BP 122/88 | HR 74 | Ht 64.0 in | Wt 150.5 lb

## 2016-05-28 DIAGNOSIS — N920 Excessive and frequent menstruation with regular cycle: Secondary | ICD-10-CM | POA: Diagnosis not present

## 2016-05-28 DIAGNOSIS — D259 Leiomyoma of uterus, unspecified: Secondary | ICD-10-CM | POA: Diagnosis not present

## 2016-05-28 NOTE — Patient Instructions (Signed)

## 2016-05-28 NOTE — Progress Notes (Signed)
   [facility] Visit  Patient name: Rose Delgado MRN [id number]  Date of birth: 1961/02/06  CC & HPI:  Rose Delgado is a 54 y.o. G0P0 African American female presenting today for f/u pelvic u/s done on 05/14/16 for enlarged uterus on exam, h/o fibroids. Reports periods are regular, last 5+ days, heavy on 1st 1-2 days then lightens some. Changes saturated pad q 1-2 hours during heaviest times. Does bleed through clothes. +dizziness at beginning of period. Has never been able to conceive. Wants hysterectomy.  Patient's last menstrual period was 05/03/2016 (exact date). The current method of family planning is none. Last pap neg w/ -HRHPV 05/10/16  Pertinent History Reviewed:  Medical & Surgical Hx:   Past medical, surgical, family, and social history reviewed in electronic medical record Medications: Reviewed & Updated - see associated section Allergies: Reviewed in electronic medical record  Objective Findings:  Vitals: BP 122/88   Pulse 74   Ht 5\' 4"  (1.626 m)   Wt 150 lb 8 oz (68.3 kg)   LMP 05/03/2016 (Exact Date)   BMI 25.83 kg/m  Body mass index is 25.83 kg/m.  Physical Examination: General appearance - alert, well appearing, and in no distress  Pelvic u/s 05/14/16:   Rose Delgado is a 55 y.o. G0P0 LMP 07/04/2015 for a pelvic sonogram for enlarged uterus.  Uterus                      8.7 x 7.12 x 6.1 cm,  Heterogeneous anteverted uterus w/mult.fibroids  Endometrium          7.1 mm, symmetrical, endometrium appears distorted by the post.submucosal  fibroid (#3) 1.4 x 7 x 1.4 cm  Right ovary             2.6 x 2.5 x 1.1 cm, wnl  Left ovary                3 x 2.4 x 1.7 cm, wnl  Largest Fibroids:   (#1) subserosal fundal left 6.4 x 6.4 x 4.6 cm,(#2)subserosal fundal right 3 x 2.1 x 3 cm,(#3) post.submucosal  fibroid 1.4 x 7 x 1.4 cm   Technician Comments:  PELVIC US TA/TV: Heterogeneous anteverted uterus w/mult.fibroids,largest fibroids: (#1)  subserosal fundal left 6.4 x 6.4 x 4.6 cm,(#2)subserosal fundal right 3 x 2.1 x 3 cm, EEC 7.1 mm,endometrium appears distorted by the post.submucosal  fibroid (#3) 1.4 x 7 x 1.4 cm ,normal ov's bilat,no pain,no free fluid seen   [employer] 05/14/2016 3:09 PM  Assessment & Plan:  A:   Multiple uterine fibroids  Menorrhagia w/ soiling of clothes  Desires hyeterectomy  P:  Check FSH, CBC today   Wants pre-op asap, after tomorrow doesn't have to return to work until 1/18  Return for asap w/ MD for pre-op.  Tawnya Crook CNM, [facility] 05/28/2016 2:14 PM

## 2016-05-29 LAB — FOLLICLE STIMULATING HORMONE: FSH: 9.8 m[IU]/mL

## 2016-05-29 LAB — CBC
Hematocrit: 35.5 % (ref 34.0–46.6)
Hemoglobin: 11.2 g/dL (ref 11.1–15.9)
MCH: 28.1 pg (ref 26.6–33.0)
MCHC: 31.5 g/dL (ref 31.5–35.7)
MCV: 89 fL (ref 79–97)
Platelets: 300 10*3/uL (ref 150–379)
RBC: 3.99 x10E6/uL (ref 3.77–5.28)
RDW: 16.8 % — ABNORMAL HIGH (ref 12.3–15.4)
WBC: 11.8 10*3/uL — ABNORMAL HIGH (ref 3.4–10.8)

## 2016-05-29 MED FILL — FLUTICASONE PROP 50 MCG SPR: 50 | 90 days supply | Qty: 48 | Fill #1

## 2016-05-31 ENCOUNTER — Other Ambulatory Visit: Payer: Self-pay | Admitting: Obstetrics and Gynecology

## 2016-05-31 ENCOUNTER — Encounter: Payer: Self-pay | Admitting: Obstetrics and Gynecology

## 2016-05-31 ENCOUNTER — Ambulatory Visit (INDEPENDENT_AMBULATORY_CARE_PROVIDER_SITE_OTHER): Payer: 59 | Admitting: Obstetrics and Gynecology

## 2016-05-31 VITALS — BP 130/80 | HR 74 | Ht 64.0 in | Wt 150.0 lb

## 2016-05-31 DIAGNOSIS — D259 Leiomyoma of uterus, unspecified: Secondary | ICD-10-CM | POA: Diagnosis not present

## 2016-05-31 DIAGNOSIS — N938 Other specified abnormal uterine and vaginal bleeding: Secondary | ICD-10-CM | POA: Diagnosis not present

## 2016-05-31 DIAGNOSIS — N858 Other specified noninflammatory disorders of uterus: Secondary | ICD-10-CM | POA: Diagnosis not present

## 2016-05-31 DIAGNOSIS — Z01818 Encounter for other preprocedural examination: Secondary | ICD-10-CM

## 2016-05-31 NOTE — Progress Notes (Addendum)
[facility]  05/31/16            Patient name: Rose Delgado MRN [id number]  Date of birth: 1960-12-25  CC & HPI:  Rose Delgado is a 55 y.o. female presenting today to discuss hysterectomy options due to uterine fibroids and menorrhagia. She continues to have regular periods and states the next one is due today or tomorrow. She reports feeling lightheaded at the beginning of her periods.  Patient is been seen  Rose Delgado a Rose Delgado, had premenopausal Rose Delgado value last month at 9 mIU/d. Ultrasound was performedAnd it showed multiple fibroids that appear to be submucosal. It is unclear if there is actually a polypoid component fibroids. ROS:  ROS  +menorrhagia +lightheadedness with start of periods Otherwise negative   Pertinent History Reviewed:   Reviewed: Significant for uterine fibroids Medical         Past Medical History:  Diagnosis Date  . Allergic rhinitis   . Fibroids   . Irregular heart beat   . Migraines   . Perimenopause 03/09/2014  . Reflux                               Surgical Hx:    Past Surgical History:  Procedure Laterality Date  . BREAST BIOPSY Left 2008   benign  . COLONOSCOPY  06/2011   Dr. Britta Mccreedy: per patient it was normal, follow up in 10 years.   . ESOPHAGOGASTRODUODENOSCOPY     Dr. Berneta Sages: late 1990s/early 2000  . ESOPHAGOGASTRODUODENOSCOPY N/A 05/19/2014   SLF:NO OBVIOUS SOURCE FOR DYSGEUSIA IDENTIFED/MILD Non-erosive gastritis  . INGUINAL HERNIA REPAIR    . KNEE SURGERY Right   . TONSILLECTOMY AND ADENOIDECTOMY     Medications: Reviewed & Updated - see associated section                       Current Outpatient Prescriptions:  .  cetirizine (ZYRTEC) 10 MG tablet, Take 10 mg by mouth daily., Disp: , Rfl:  .  cholecalciferol (VITAMIN D) 1000 units tablet, Take 1,000 Units by mouth daily., Disp: , Rfl:  .  dexlansoprazole (DEXILANT) 60 MG capsule, Take 1 capsule (60 mg total) by mouth daily., Disp: 90 capsule, Rfl: 3 .   fluticasone (FLONASE) 50 MCG/ACT nasal spray, Place into both nostrils as needed for allergies or rhinitis., Disp: , Rfl:  .  metoprolol tartrate (LOPRESSOR) 25 MG tablet, Take 12.5 mg by mouth 2 (two) times daily. , Disp: , Rfl:  .  Multiple Vitamin (MULTIVITAMIN) tablet, Take 1 tablet by mouth daily., Disp: , Rfl:  .  ondansetron (ZOFRAN) 4 MG tablet, Take 1 tablet (4 mg total) by mouth every 4 (four) hours as needed for nausea or vomiting., Disp: 30 tablet, Rfl: 2 .  Polyethylene Glycol 3350 (MIRALAX PO), Take 17 g by mouth daily., Disp: , Rfl:  .  rizatriptan (MAXALT-MLT) 10 MG disintegrating tablet, Take 10 mg by mouth as needed for migraine. May repeat in 2 hours if needed, Disp: , Rfl:  .  terconazole (TERAZOL 7) 0.4 % vaginal cream, Place 1 applicator vaginally at bedtime. X 7 nights, Disp: 45 g, Rfl: 0   Social History: Reviewed -  reports that she has never smoked. She has never used smokeless tobacco.  Objective Findings:  Vitals: Blood pressure 130/80, pulse 74, height 5\' 4"  (1.626 m), weight 150 lb (68 kg), last menstrual period 05/03/2016.  Physical Examination: Pelvic - normal external genitalia, vulva, vagina, cervix, uterus and adnexa Uterus moderately enlarged consistent with 10 week size uterus, well supported not amenable to a vaginal hysterectomy Endometrial Biopsy: Patient given informed consent, signed copy in the chart, time out was performed. Time out taken. The patient was placed in the lithotomy position and the cervix brought into view with sterile speculum.  Portio of cervix cleansed x 2 with betadine swabs.  A tenaculum was placed in the anterior lip of the cervix. The uterus was sounded for depth of 9 cm,. Milex uterine Explora 3 mm was introduced to into the uterus, suction created,  and an endometrial sample was obtained. All equipment was removed and accounted for.   The patient tolerated the procedure well.    Patient given post procedure instructions.      Assessment & Plan:   A:  1. Uterine fibroids10 weeks, 200 g rule out endometrial polyp or endometrial  fibroid 2. Peri-menopausal bleeding   P:  1. Follow up in 4 days for sonohystogram and discuss  Results and to decide hysterectomy vs hysteroscopy depending on position of fibroid    By signing my name below, I, Sonum Patel, attest that this documentation has been prepared under the direction and in the presence of Jonnie Kind, MD. Electronically Signed: Sonum Patel, Education administrator. 05/31/16. 10:56 AM.  I personally performed the services described in this documentation, which was SCRIBED in my presence. The recorded information has been reviewed and considered accurate. It has been edited as necessary during review. Jonnie Kind, MD

## 2016-06-11 ENCOUNTER — Other Ambulatory Visit: Payer: 59

## 2016-06-17 IMAGING — US US SOFT TISSUE HEAD/NECK
1 series · 13 of 25 positions shown · non-contrast
Comparison: CT 01/14/2014

CLINICAL DATA: 54-year-old female with a history of thyroid nodules

EXAM:
THYROID ULTRASOUND
TECHNIQUE: Ultrasound examination of the thyroid gland and adjacent soft
tissues was performed.

[Series 1: us soft tissue head/neck · 0.05mm/px · 13 of 48 slices shown]
[im 1/48]
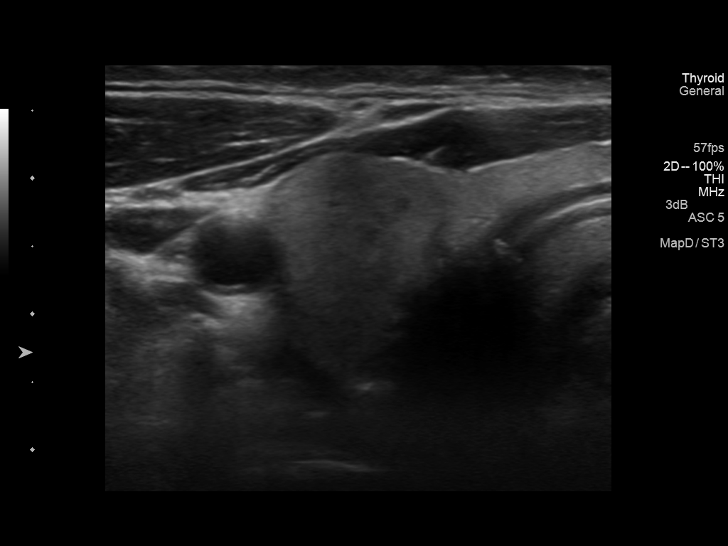
[im 4/48]
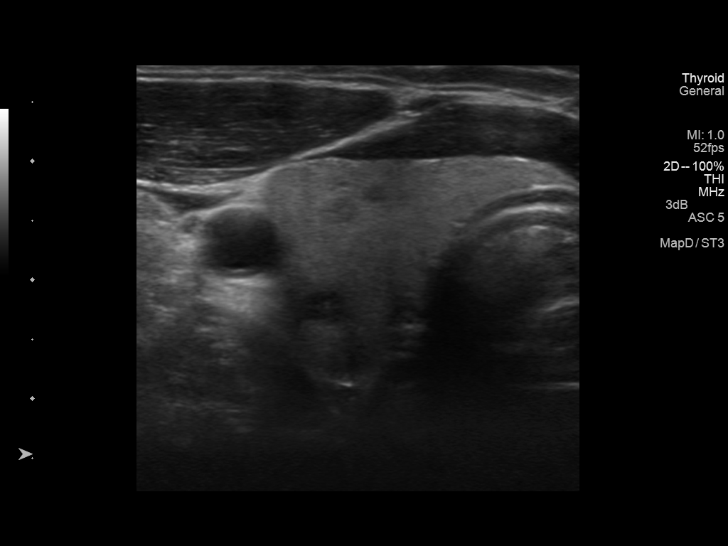
[im 8/48]
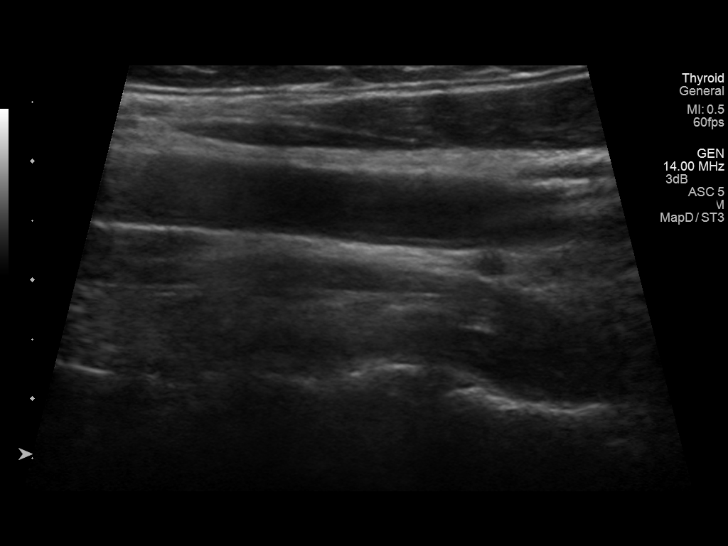
[im 12/48]
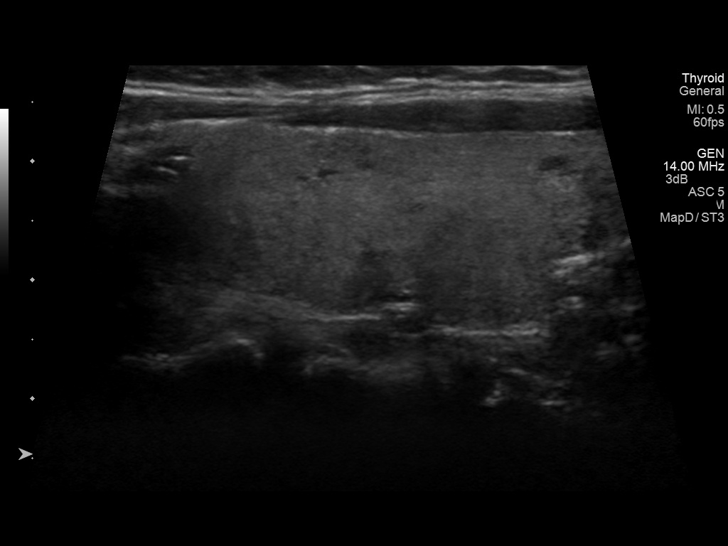
[im 16/48]
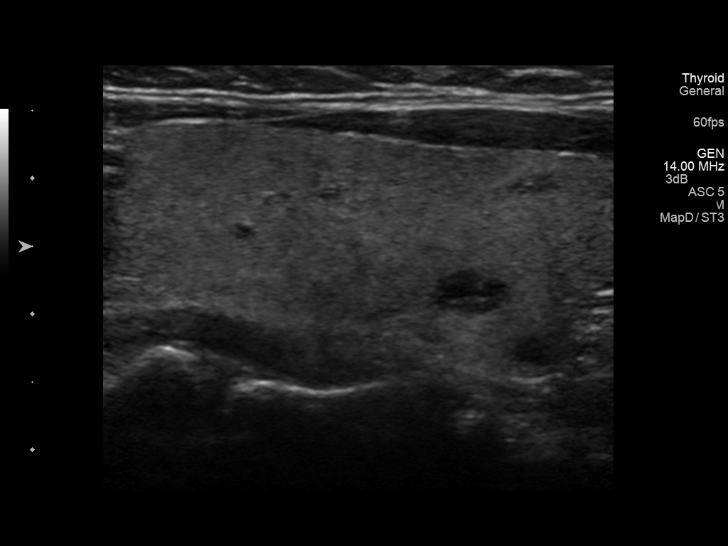
[im 20/48]
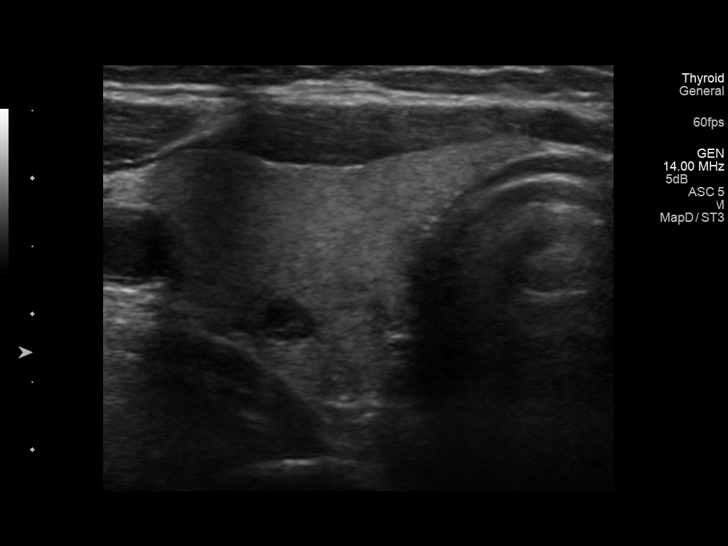
[im 24/48]
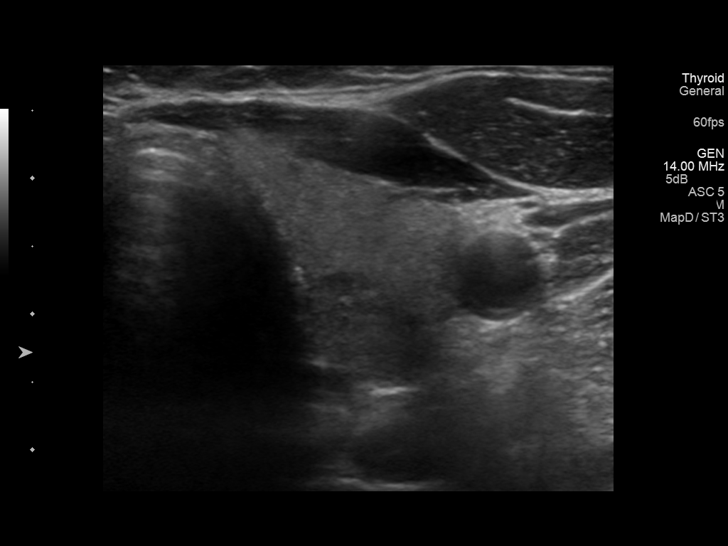
[im 28/48]
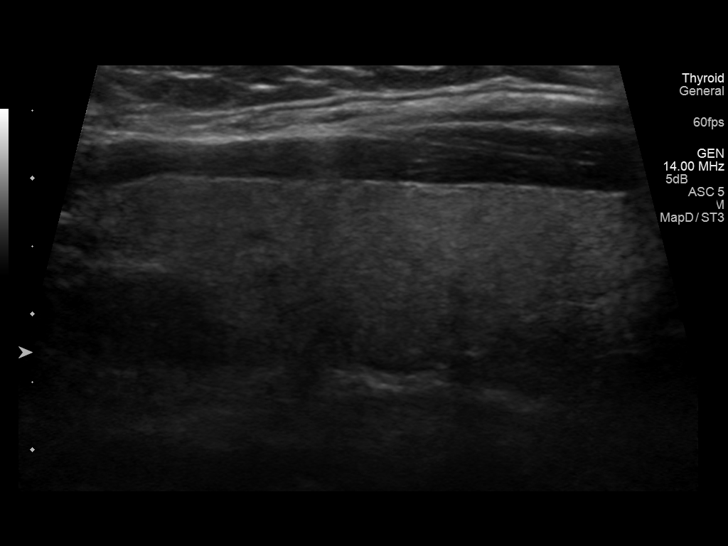
[im 32/48]
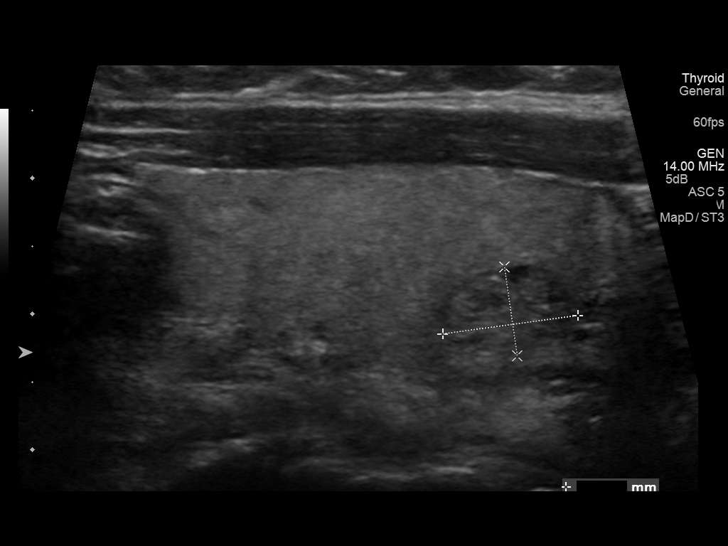
[im 36/48]
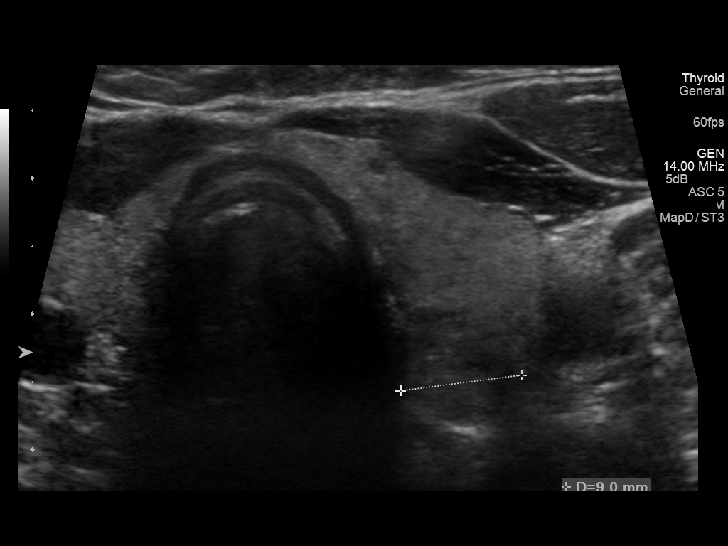
[im 40/48]
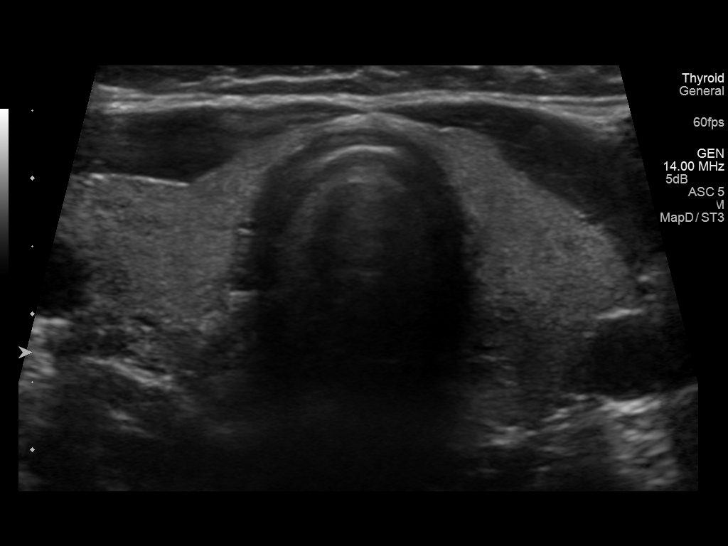
[im 44/48]
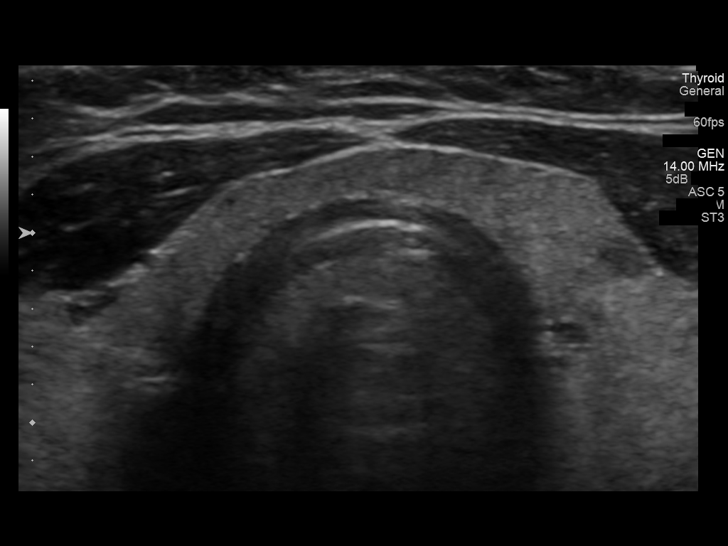
[im 48/48]
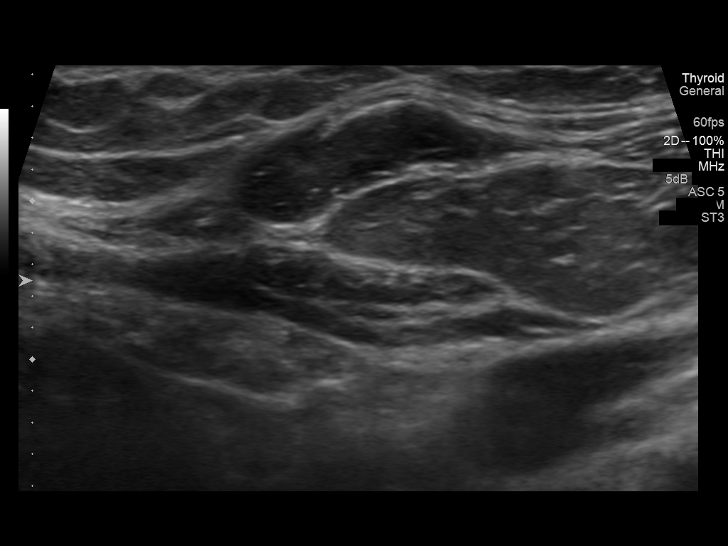

[13 of 25 positions shown; findings below may reference images not displayed]

FINDINGS: Right thyroid lobe

Measurements: 4.9 cm x 1.7 cm x 1.7 cm. Heterogeneous appearance of
the right thyroid tissue, with relatively increased flow. Small
hypoechoic nodule at the inferior right thyroid measures no more
than 6 mm.

Left thyroid lobe

Measurements: 5.0 cm x 1.6 cm x 1.6 cm. Heterogeneous appearance of
the left thyroid with relatively increased flow.

Nodule at the inferior left thyroid measures 1.0 cm x 7 mm x 9 mm.
Punctate internal reflectors may represent calcium or colloid.

Isthmus

Thickness: 3 mm.  No nodules visualized.

Lymphadenopathy

None visualized.
IMPRESSION: Heterogeneous thyroid with relatively increased flow, potentially
representing spectrum of thyroiditis.

Nodule at the inferior left thyroid meets criteria for biopsy.
Findings meet consensus criteria for biopsy. Ultrasound-guided fine
needle aspiration should be considered, as per the consensus
statement: Management of Thyroid Nodules Detected at US: Society of
Radiologists in Ultrasound Consensus Conference Statement. Radiology
3009; [DATE].

## 2016-06-20 MED FILL — DEXILANT DR 60 MG CAPSULE: 60 | 90 days supply | Qty: 90 | Fill #1

## 2016-07-09 ENCOUNTER — Encounter: Payer: 59 | Attending: Family Medicine | Admitting: Nutrition

## 2016-07-09 VITALS — Ht 64.0 in | Wt 147.0 lb

## 2016-07-09 DIAGNOSIS — E782 Mixed hyperlipidemia: Secondary | ICD-10-CM

## 2016-07-09 DIAGNOSIS — Z713 Dietary counseling and surveillance: Secondary | ICD-10-CM | POA: Insufficient documentation

## 2016-07-09 DIAGNOSIS — K219 Gastro-esophageal reflux disease without esophagitis: Secondary | ICD-10-CM

## 2016-07-09 NOTE — Patient Instructions (Addendum)
Goals 1. Increase high fiber  Foods and follow plant based diet 2. Follow guides for [place] 3. Get labs done with Dr. Quillian Quince 4. Get TCHO less than 200 mg/dl. And TG less than < 150 mg/dl 5 Exercise 60 minutes 4 times a week Keep 10,000 steps daily.  Drink water to 5 bottles per day

## 2016-07-09 NOTE — Progress Notes (Signed)
  Medical Nutrition Therapy:  Appt start time: 0800 end time:  0830.  Assessment:  Primary concerns today: Gastroparesis and Reflux and weight loss.Hasn't been back to Dr. Quillian Quince yet. Just got [place] over [employer] and What would [employer]..  Wants to work on changing over more to vegan meals. . Will get labs this month. Changes  Cut out whole milk and now on 1-2%. Wants to go Vegan. Doing cottage cheese, nut and more vegetables. Has cut out ice cream. Still likes to eat her junk food. 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 Has changed seasoning...... Has been cooking a lot more.      Boyfriend isn't supportive of going to vegan diet. But she is starting to buy more dried beans, vegetables and whole grains and not eating out fast foods. Working on getting off chips, nabs and processed snacks. Reflux is better. Needs to work on more exercise. Miralax is helping with BM's. Drinking more water now.     To get labs drawn in Jan/Feb at Dr. Olena Heckle office.   Preferred Learning Style:    Auditory  Visual  Hands on  Learning Readiness:    Ready  Change in progress  MEDICATIONS: See list   DIETARY INTAKE:.    24-hr recall:  B ( AM):  Omelet or Fruit and health bar, OJ and water Snk ( AM): none L ( PM): fruit,  Carrots, celery.  Nuts; D)  Vegetables, baked/grilled meat, Snk ( PM): yogurt sometimes. Beverages: water,   Usual physical activity: working on joining a gym for exercise.  Estimated energy needs: 1500 calories 170 g carbohydrates 112 g protein 42 g fat  Progress Towards Goal(s):  In progress.   Nutritional Diagnosis:  NB-1.1 Food and nutrition-related knowledge deficit As related to Gastroparesis and GERD.  As evidenced by bloating, EGD results and relux symptoms..    Intervention:  Nutrition counseling on diet for gastroparesis and Reflux. Benefits of exercise, eating small,  frequent meals. Avoid spicy acidic and high fat foods. Watching fiber in  diet and eating smaller amounts more often. Reviewed nutiriton  education handouts on Gastroparesis and GERD. Meal planning and importance of balanced meals and increased fresh fruits and vegetables.  Goals 1. Increase high fiber  Foods and follow plant based diet 2. Follow guides for [place] 3. Get labs done with Dr. Quillian Quince 4. Get TCHO less than 200 mg/dl. And TG less than < 150 mg/dl 5 Exercise 60 minutes 4 times a week Keep 10,000 steps daily.  Drink water to 5 bottles per day  Teaching Method Utilized:  Visual Auditory Hands on  Handouts given during visit include:  Nutrition Therapy for Gastroparesis  Nutrition Therapy for GERD  Meal Plan Card  Barriers to learning/adherence to lifestyle change: none  Demonstrated degree of understanding via:  Teach Back   Monitoring/Evaluation:  Dietary intake, exercise, meal planning, SBG, and body weight in 3 month(s).  She would benefit from omega three fish oils and a plant based diet for her Hyperlipidemia.

## 2016-09-11 ENCOUNTER — Ambulatory Visit (INDEPENDENT_AMBULATORY_CARE_PROVIDER_SITE_OTHER): Payer: 59 | Admitting: Cardiovascular Disease

## 2016-09-11 ENCOUNTER — Ambulatory Visit (HOSPITAL_COMMUNITY)
Admission: RE | Admit: 2016-09-11 | Discharge: 2016-09-11 | Disposition: A | Discharge status: Home / Self Care | Payer: 59 | Source: Ambulatory Visit | Attending: Cardiovascular Disease | Admitting: Cardiovascular Disease

## 2016-09-11 ENCOUNTER — Other Ambulatory Visit (HOSPITAL_COMMUNITY)
Admission: RE | Admit: 2016-09-11 | Discharge: 2016-09-11 | Disposition: A | Discharge status: Home / Self Care | Payer: 59 | Source: Ambulatory Visit | Attending: Cardiovascular Disease | Admitting: Cardiovascular Disease

## 2016-09-11 ENCOUNTER — Encounter: Payer: Self-pay | Admitting: Cardiovascular Disease

## 2016-09-11 VITALS — BP 144/76 | HR 75 | Ht 64.0 in | Wt 144.0 lb

## 2016-09-11 DIAGNOSIS — I493 Ventricular premature depolarization: Secondary | ICD-10-CM

## 2016-09-11 DIAGNOSIS — Z136 Encounter for screening for cardiovascular disorders: Secondary | ICD-10-CM

## 2016-09-11 DIAGNOSIS — R002 Palpitations: Secondary | ICD-10-CM | POA: Diagnosis not present

## 2016-09-11 DIAGNOSIS — R030 Elevated blood-pressure reading, without diagnosis of hypertension: Secondary | ICD-10-CM

## 2016-09-11 LAB — CBC
HCT: 36.3 % (ref 36.0–46.0)
Hemoglobin: 11.8 g/dL — ABNORMAL LOW (ref 12.0–15.0)
MCH: 28.6 pg (ref 26.0–34.0)
MCHC: 32.5 g/dL (ref 30.0–36.0)
MCV: 88.1 fL (ref 78.0–100.0)
Platelets: 343 10*3/uL (ref 150–400)
RBC: 4.12 MIL/uL (ref 3.87–5.11)
RDW: 17.1 % — ABNORMAL HIGH (ref 11.5–15.5)
WBC: 10.7 10*3/uL — ABNORMAL HIGH (ref 4.0–10.5)

## 2016-09-11 LAB — BASIC METABOLIC PANEL
Anion gap: 5 (ref 5–15)
BUN: 9 mg/dL (ref 6–20)
CO2: 26 mmol/L (ref 22–32)
Calcium: 9.6 mg/dL (ref 8.9–10.3)
Chloride: 104 mmol/L (ref 101–111)
Creatinine, Ser: 0.77 mg/dL (ref 0.44–1.00)
GFR calc Af Amer: >60 mL/min (ref >60)
GFR calc non Af Amer: >60 mL/min (ref >60)
Glucose, Bld: 95 mg/dL (ref 65–99)
Potassium: 3.6 mmol/L (ref 3.5–5.1)
Sodium: 135 mmol/L (ref 135–145)

## 2016-09-11 LAB — MAGNESIUM: Magnesium: 1.9 mg/dL (ref 1.7–2.4)

## 2016-09-11 LAB — TSH: TSH: 0.759 u[IU]/mL (ref 0.350–4.500)

## 2016-09-11 MED ORDER — METOPROLOL SUCCINATE ER 25 MG PO TB24: ORAL_TABLET | ORAL | 3 refills | Status: DC

## 2016-09-11 MED FILL — METOPROLOL SUCC ER 25 MG TA: 25 | 90 days supply | Qty: 135 | Fill #0

## 2016-09-11 NOTE — Progress Notes (Signed)
CARDIOLOGY CONSULT NOTE  Patient ID: Rose Delgado MRN: [id number] DOB/AGE: Jun 13, 1961 56 y.o.  Admit date: (Not on file) Primary Physician: [name], MD Referring Physician: Dr. [name] MD  Reason for Consultation: palpitations  HPI: 56 yr old woman with past medical history significant for GERD and migraines presents for the evaluation of palpitations. She says they've been ongoing for 10-15 years. She used to see a cardiologist in [place] ([place]) several years ago, and has undergone stress testing and wore a Holter monitor (twice) several years ago.  She had been on atenolol in the past for migraines. She was started on Toprol-XL 25 mg bid a few years back but she found that her watch (HR monitor) found HR dipping into the 30's. She was then inadvertently put on metoprolol tartrate 25 mg bid but this did not suppress symptoms. She is now taking Toprol-XL 25 mg q am but she begins to experience palpitations by the evening. She describes them as a "hiccup"-like sensation in her chest. She has occasional pains radiating into her back but denies exertional chest pain and dyspnea. PVC's are associated with having to take deep breaths. When she walks from the parking lot to the hospital she develops palpitations, particularly when carrying things. She does have episodic dizziness but denies syncope.  She avoids caffeine.  She does not exercise.  ECG today shows sinus rhythm with nonspecific ST abnormalities.  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    Allergies  Allergen Reactions  . Flagyl [Metronidazole] Nausea And Vomiting  . Lactose Intolerance (Gi)   . Shellfish Allergy   . Topamax [Topiramate] Other (See Comments)  . Ciprofloxacin Hcl Nausea Only  . Diflucan [Fluconazole] Nausea And Vomiting    Current Outpatient Prescriptions  Medication Sig Dispense  Refill  . cetirizine (ZYRTEC) 10 MG tablet Take 10 mg by mouth daily.    . cholecalciferol (VITAMIN D) 1000 units tablet Take 1,000 Units by mouth daily.    Marland Kitchen dexlansoprazole (DEXILANT) 60 MG capsule Take 1 capsule (60 mg total) by mouth daily. 90 capsule 3  . fluticasone (FLONASE) 50 MCG/ACT nasal spray Place into both nostrils as needed for allergies or rhinitis.    . Multiple Vitamin (MULTIVITAMIN) tablet Take 1 tablet by mouth daily.    . ondansetron (ZOFRAN) 4 MG tablet Take 1 tablet (4 mg total) by mouth every 4 (four) hours as needed for nausea or vomiting. 30 tablet 2  . Polyethylene Glycol 3350 (MIRALAX PO) Take 17 g by mouth daily.    . rizatriptan (MAXALT-MLT) 10 MG disintegrating tablet Take 10 mg by mouth as needed for migraine. May repeat in 2 hours if needed    . metoprolol succinate (TOPROL XL) 25 MG 24 hr tablet Take 25 mg am and 12.5 mg pm 135 tablet 3   No current facility-administered medications for this visit.     Past Medical History:  Diagnosis Date  . Allergic rhinitis   . Fibroids   . Irregular heart beat   . Migraines   . Perimenopause 03/09/2014  . Reflux     Past Surgical History:  Procedure Laterality Date  . BREAST BIOPSY Left 2008   benign  . COLONOSCOPY  06/2011   Dr. Britta Mccreedy: per patient it was normal, follow up in 10 years.   . ESOPHAGOGASTRODUODENOSCOPY     Dr.  Spanhour: late 1990s/early 2000  . ESOPHAGOGASTRODUODENOSCOPY N/A 05/19/2014   SLF:NO OBVIOUS SOURCE FOR DYSGEUSIA IDENTIFED/MILD Non-erosive gastritis  . INGUINAL HERNIA REPAIR    . KNEE SURGERY Right   . TONSILLECTOMY AND ADENOIDECTOMY      Social History   Social History  . Marital status: Divorced    Spouse name: N/A  . Number of children: N/A  . Years of education: N/A   Occupational History  . 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 Smoking status: Never Smoker  . Smokeless tobacco: Never Used  . Alcohol use No  . Drug use: No  . Sexual activity: Yes     Birth control/ protection: None   Other Topics Concern  . Not on file   Social History Narrative  . No narrative on file     No family history of premature CAD in 1st degree relatives.  Prior to Admission medications   Medication Sig Start Date End Date Taking? Authorizing Provider  cetirizine (ZYRTEC) 10 MG tablet Take 10 mg by mouth daily.    Historical Provider, MD  cholecalciferol (VITAMIN D) 1000 units tablet Take 1,000 Units by mouth daily.    Historical Provider, MD  dexlansoprazole (DEXILANT) 60 MG capsule Take 1 capsule (60 mg total) by mouth daily. 03/23/16   Carlis Stable, NP  fluticasone (FLONASE) 50 MCG/ACT nasal spray Place into both nostrils as needed for allergies or rhinitis.    Historical Provider, MD  metoprolol tartrate (LOPRESSOR) 25 MG tablet Take 12.5 mg by mouth 2 (two) times daily.     Historical Provider, MD  Multiple Vitamin (MULTIVITAMIN) tablet Take 1 tablet by mouth daily.    Historical Provider, MD  ondansetron (ZOFRAN) 4 MG tablet Take 1 tablet (4 mg total) by mouth every 4 (four) hours as needed for nausea or vomiting. 07/14/14   Danie Binder, MD  Polyethylene Glycol 3350 (MIRALAX PO) Take 17 g by mouth daily.    Historical Provider, MD  rizatriptan (MAXALT-MLT) 10 MG disintegrating tablet Take 10 mg by mouth as needed for migraine. May repeat in 2 hours if needed    Historical Provider, MD  terconazole (TERAZOL 7) 0.4 % vaginal cream Place 1 applicator vaginally at bedtime. X 7 nights 04/19/16   Roma Schanz, CNM     Review of systems complete and found to be negative unless listed above in HPI     Physical exam Blood pressure (!) 144/76, pulse 75, height 5\' 4"  (1.626 m), weight 144 lb (65.3 kg), last menstrual period 08/25/2016, SpO2 99 %. General: NAD Neck: No JVD, no thyromegaly or thyroid nodule.  Lungs: Clear to auscultation bilaterally with normal respiratory effort. CV: Nondisplaced PMI. Regular rate and rhythm, normal S1/S2, no  S3/S4, no murmur.  No peripheral edema.  No carotid bruit.  Normal pedal pulses.  Abdomen: Soft, nontender, no hepatosplenomegaly, no distention.  Skin: Intact without lesions or rashes.  Neurologic: Alert and oriented x 3.  Psych: Normal affect. Extremities: No clubbing or cyanosis.  HEENT: Normal.   ECG: Most recent ECG reviewed.  Telemetry: Independently reviewed.  Labs:   Lab Results  Component Value Date   WBC 11.8 (H) 05/28/2016   HCT 35.5 05/28/2016   MCV 89 05/28/2016   PLT 300 05/28/2016   No results for input(s): NA, K, CL, CO2, BUN, CREATININE, CALCIUM, PROT, BILITOT, ALKPHOS, ALT, AST, GLUCOSE in the last 168 hours.  Invalid input(s): LABALBU No results found for:  CKTOTAL, CKMB, CKMBINDEX, TROPONINI No results found for: CHOL No results found for: HDL No results found for: LDLCALC No results found for: TRIG No results found for: CHOLHDL No results found for: LDLDIRECT       Studies: No results found.  ASSESSMENT AND PLAN:  1. Palpitations/PVC's: I will have her wear a 24 hour Holter monitor and have her hold metoprolol while doing so, so that I can more accurately quantify and clarify her arrhythmia. I will order a 2-D echocardiogram with Doppler to evaluate cardiac structure, function, and regional wall motion. I will order a BMET, TSH, CBC, and magnesium level. I will increase Toprol-XL to 25 mg q am and 12.5 mg q pm. I talked about alternative approaches such as calcium channel blockers, antiarrhythmic therapy (i.e. amiodarone) and PVC ablation. Hopefully, AV nodal nodal blocking agents will be enough to control her symptoms.  2. Elevated BP: No diagnosis of hypertension. Will monitor.  Dispo: fu 1 month.   Signed: Kate Sable, M.D., F.A.C.C.  09/11/2016, 1:46 PM

## 2016-09-11 NOTE — Patient Instructions (Signed)
Your physician recommends that you schedule a follow-up appointment in: 1 month Dr Bronson Ing    INCREASE Toprol to 25 mg in the am , and 12.5 mg pm   Your physician has requested that you have an echocardiogram. Echocardiography is a painless test that uses sound waves to create images of your heart. It provides your doctor with information about the size and shape of your heart and how well your heart's chambers and valves are working. This procedure takes approximately one hour. There are no restrictions for this procedure.    Your physician has recommended that you wear a holter monitor for 24 hours HOLD TOPROL DURING TEST. Holter monitors are medical devices that record the heart's electrical activity. Doctors most often use these monitors to diagnose arrhythmias. Arrhythmias are problems with the speed or rhythm of the heartbeat. The monitor is a small, portable device. You can wear one while you do your normal daily activities. This is usually used to diagnose what is causing palpitations/syncope (passing out).      Get lab work: cbc, bmet,tsh,magnesium    Your physician has requested that you have an echocardiogram. Echocardiography is a painless test that uses sound waves to create images of your heart. It provides your doctor with information about the size and shape of your heart and how well your heart's chambers and valves are working. This procedure takes approximately one hour. There are no restrictions for this procedure.

## 2016-09-12 ENCOUNTER — Ambulatory Visit (HOSPITAL_COMMUNITY)
Admission: RE | Admit: 2016-09-12 | Discharge: 2016-09-12 | Disposition: A | Discharge status: Home / Self Care | Payer: 59 | Source: Ambulatory Visit | Attending: Cardiovascular Disease | Admitting: Cardiovascular Disease

## 2016-09-12 DIAGNOSIS — I493 Ventricular premature depolarization: Secondary | ICD-10-CM | POA: Insufficient documentation

## 2016-09-12 DIAGNOSIS — K219 Gastro-esophageal reflux disease without esophagitis: Secondary | ICD-10-CM | POA: Insufficient documentation

## 2016-09-12 DIAGNOSIS — R002 Palpitations: Secondary | ICD-10-CM | POA: Insufficient documentation

## 2016-09-12 LAB — ECHOCARDIOGRAM COMPLETE
E decel time: 239 msec
E/e' ratio: 7.45
FS: 48 % — AB (ref 28–44)
IVS/LV PW RATIO, ED: 0.88
LA ID, A-P, ES: 34 mm
LA diam end sys: 34 mm
LA diam index: 1.97 cm/m2
LA vol A4C: 30 ml
LA vol index: 16 mL/m2
LA vol: 27.6 mL
LV E/e' medial: 7.45
LV E/e'average: 7.45
LV PW d: 9.19 mm — AB (ref 0.6–1.1)
LV dias vol index: 26 mL/m2
LV dias vol: 45 mL — AB (ref 46–106)
LV e' LATERAL: 10.9 cm/s
LV sys vol index: 8 mL/m2
LV sys vol: 14 mL (ref 14–42)
LVOT SV: 75 mL
LVOT VTI: 29.5 cm
LVOT area: 2.54 cm2
LVOT diameter: 18 mm
LVOT peak grad rest: 8 mmHg
LVOT peak vel: 143 cm/s
Lateral S' vel: 15.6 cm/s
MV Dec: 239
MV Peak grad: 3 mmHg
MV pk A vel: 69.6 m/s
MV pk E vel: 81.2 m/s
RV sys press: 26 mmHg
Reg peak vel: 238 cm/s
Simpson's disk: 69
Stroke v: 31 ml
TAPSE: 26.9 mm
TDI e' lateral: 10.9
TDI e' medial: 7.94
TR max vel: 238 cm/s

## 2016-09-12 NOTE — Progress Notes (Signed)
*  PRELIMINARY RESULTS* Echocardiogram 2D Echocardiogram has been performed.  Samuel Germany 09/12/2016, 3:12 PM

## 2016-09-13 ENCOUNTER — Telehealth: Payer: Self-pay | Admitting: *Deleted

## 2016-09-13 ENCOUNTER — Telehealth: Payer: Self-pay

## 2016-09-13 DIAGNOSIS — E876 Hypokalemia: Secondary | ICD-10-CM

## 2016-09-13 MED ORDER — MAGNESIUM 200 MG PO TABS: 200.0000 mg | ORAL_TABLET | Freq: Every day | ORAL | 3 refills | Status: DC

## 2016-09-13 MED ORDER — POTASSIUM CHLORIDE CRYS ER 20 MEQ PO TBCR: 20.0000 meq | EXTENDED_RELEASE_TABLET | Freq: Every day | ORAL | 3 refills | Status: DC

## 2016-09-13 MED FILL — POLYETHYLENE GLYCOL 3350 PO: 30 days supply | Qty: 1054 | Fill #0

## 2016-09-13 MED FILL — KLOR-CON M20 TABLET: 20 | 90 days supply | Qty: 90 | Fill #0

## 2016-09-13 MED FILL — FLUTICASONE PROP 50 MCG SPR: 50 | 90 days supply | Qty: 48 | Fill #2

## 2016-09-13 NOTE — Telephone Encounter (Signed)
I spoke with pt,lab results given, escribed meds to [facility] pharmacy, mailed lab slip,copied pcp

## 2016-09-13 NOTE — Telephone Encounter (Signed)
-----   Message from Herminio Commons, MD sent at 09/11/2016  4:55 PM EDT ----- K and Mg normal, but I'd still like to start supplementation to aim for K > 4 and Mg > 2 to try and suppress PVC's. Start KCl 20 meq daily and Mg 200 mg daily and repeat BMET in 3 weeks.

## 2016-09-13 NOTE — Telephone Encounter (Signed)
See previous notes, copied pcp

## 2016-09-13 NOTE — Telephone Encounter (Signed)
Called patient with test results. No answer. Left message to call back.  

## 2016-09-13 NOTE — Telephone Encounter (Signed)
-----   Message from Herminio Commons, MD sent at 09/13/2016  5:52 PM EDT -----  Sinus rhythm with frequent PVC's (17% of all beats).  No diary was returned.

## 2016-09-14 DIAGNOSIS — H40013 Open angle with borderline findings, low risk, bilateral: Secondary | ICD-10-CM | POA: Diagnosis not present

## 2016-09-14 DIAGNOSIS — H5213 Myopia, bilateral: Secondary | ICD-10-CM | POA: Diagnosis not present

## 2016-09-14 DIAGNOSIS — H52221 Regular astigmatism, right eye: Secondary | ICD-10-CM | POA: Diagnosis not present

## 2016-09-14 DIAGNOSIS — H524 Presbyopia: Secondary | ICD-10-CM | POA: Diagnosis not present

## 2016-09-17 MED FILL — SM COMPLETE 50+ TABLET: 125 days supply | Qty: 125 | Fill #2

## 2016-09-20 ENCOUNTER — Telehealth: Payer: Self-pay | Admitting: Cardiovascular Disease

## 2016-09-20 NOTE — Telephone Encounter (Signed)
Pt c/o headache and dizziness. States she has new contacts but received them on Monday and does not feel they are the cause. Pt BP are as follows lying 116/69 HR 70, sitting 127/76 HR 69, standing at 0 min 113/73 HR 69, standing at 3 min 118/69 HR 70. Please advise.

## 2016-09-20 NOTE — Telephone Encounter (Signed)
Patient states that she has been extremely dizzy after taking stronger dose of Metoprolol. / tg

## 2016-09-21 MED ORDER — DILTIAZEM HCL 30 MG PO TABS: 30.0000 mg | ORAL_TABLET | Freq: Two times a day (BID) | ORAL | 6 refills | Status: DC

## 2016-09-21 NOTE — Telephone Encounter (Signed)
Spoke with pt. Given instructions. Pt voiced understanding.

## 2016-09-21 NOTE — Telephone Encounter (Signed)
Switch Toprol-XL to short-acting diltiazem 30 mg bid

## 2016-09-21 NOTE — Addendum Note (Signed)
Addended by: Levonne Hubert on: 09/21/2016 03:15 PM   Modules accepted: Orders

## 2016-09-21 NOTE — Telephone Encounter (Signed)
Called patient with test results. No answer. Left message to call back.  

## 2016-09-24 MED FILL — PANTOPRAZOLE SOD DR 40 MG T: 40 | 90 days supply | Qty: 90 | Fill #0

## 2016-10-02 ENCOUNTER — Telehealth: Payer: Self-pay

## 2016-10-02 DIAGNOSIS — E7849 Other hyperlipidemia: Secondary | ICD-10-CM

## 2016-10-02 NOTE — Telephone Encounter (Signed)
Pt wants lipids, do you concur ?

## 2016-10-03 NOTE — Telephone Encounter (Signed)
Sure

## 2016-10-04 NOTE — Addendum Note (Signed)
Addended by: Barbarann Ehlers A on: 10/04/2016 08:13 AM   Modules accepted: Orders

## 2016-10-05 ENCOUNTER — Other Ambulatory Visit: Payer: Self-pay

## 2016-10-05 ENCOUNTER — Other Ambulatory Visit (HOSPITAL_COMMUNITY)
Admission: RE | Admit: 2016-10-05 | Discharge: 2016-10-05 | Disposition: A | Discharge status: Home / Self Care | Payer: 59 | Source: Ambulatory Visit | Attending: Cardiovascular Disease | Admitting: Cardiovascular Disease

## 2016-10-05 DIAGNOSIS — E784 Other hyperlipidemia: Secondary | ICD-10-CM | POA: Diagnosis not present

## 2016-10-05 DIAGNOSIS — Z79899 Other long term (current) drug therapy: Secondary | ICD-10-CM

## 2016-10-05 DIAGNOSIS — E876 Hypokalemia: Secondary | ICD-10-CM | POA: Diagnosis not present

## 2016-10-05 LAB — MAGNESIUM: Magnesium: 2 mg/dL (ref 1.7–2.4)

## 2016-10-05 LAB — LIPID PANEL
Cholesterol: 197 mg/dL (ref 0–200)
HDL: 45 mg/dL (ref >40)
LDL Cholesterol: 132 mg/dL — ABNORMAL HIGH (ref 0–99)
Total CHOL/HDL Ratio: 4.4 RATIO
Triglycerides: 101 mg/dL (ref <150)
VLDL: 20 mg/dL (ref 0–40)

## 2016-10-05 LAB — BASIC METABOLIC PANEL
Anion gap: 6 (ref 5–15)
BUN: 11 mg/dL (ref 6–20)
CO2: 23 mmol/L (ref 22–32)
Calcium: 9.5 mg/dL (ref 8.9–10.3)
Chloride: 106 mmol/L (ref 101–111)
Creatinine, Ser: 0.71 mg/dL (ref 0.44–1.00)
GFR calc Af Amer: >60 mL/min (ref >60)
GFR calc non Af Amer: >60 mL/min (ref >60)
Glucose, Bld: 99 mg/dL (ref 65–99)
Potassium: 4.1 mmol/L (ref 3.5–5.1)
Sodium: 135 mmol/L (ref 135–145)

## 2016-10-15 ENCOUNTER — Telehealth: Payer: Self-pay | Admitting: Cardiovascular Disease

## 2016-10-15 ENCOUNTER — Other Ambulatory Visit: Payer: Self-pay

## 2016-10-15 MED ORDER — METOPROLOL SUCCINATE ER 25 MG PO TB24: ORAL_TABLET | ORAL | 3 refills | Status: DC

## 2016-10-15 NOTE — Telephone Encounter (Signed)
°*  STAT* If patient is at the pharmacy, call can be transferred to refill team.   1. Which medications need to be refilled? (please list name of each medication and dose if known)  metoprolol succinate (TOPROL XL) 25 MG 24 hr tablet [[id number]]    2. Which pharmacy/location (including street and city if local pharmacy) is medication to be sent to? [facility]  3. Do they need a 30 day or 90 day supply?  90 day supply

## 2016-10-15 NOTE — Telephone Encounter (Signed)
Sent in 90 day refill to [facility].

## 2016-10-17 ENCOUNTER — Encounter: Payer: Self-pay | Admitting: Cardiovascular Disease

## 2016-10-17 ENCOUNTER — Ambulatory Visit (INDEPENDENT_AMBULATORY_CARE_PROVIDER_SITE_OTHER): Payer: 59 | Admitting: Cardiovascular Disease

## 2016-10-17 VITALS — BP 128/98 | HR 76 | Ht 64.0 in | Wt 144.0 lb

## 2016-10-17 DIAGNOSIS — R002 Palpitations: Secondary | ICD-10-CM | POA: Diagnosis not present

## 2016-10-17 DIAGNOSIS — I493 Ventricular premature depolarization: Secondary | ICD-10-CM

## 2016-10-17 DIAGNOSIS — I10 Essential (primary) hypertension: Secondary | ICD-10-CM | POA: Diagnosis not present

## 2016-10-17 MED ORDER — LOSARTAN POTASSIUM 25 MG PO TABS: 25.0000 mg | ORAL_TABLET | Freq: Every day | ORAL | 6 refills | Status: DC

## 2016-10-17 MED ORDER — DILTIAZEM HCL 30 MG PO TABS: 30.0000 mg | ORAL_TABLET | Freq: Two times a day (BID) | ORAL | 3 refills | Status: DC

## 2016-10-17 MED FILL — dilTIAZem HCL 30 MG TABS: 30 | 90 days supply | Qty: 180 | Fill #0

## 2016-10-17 NOTE — Patient Instructions (Addendum)
Medication Instructions:   Begin Losartan 25mg  daily.  Continue all other medications.    Labwork:  BMET, Magnesium - orders given today.  Due just prior to next visit.  Testing/Procedures: none  Follow-Up: 3 months   Any Other Special Instructions Will Be Listed Below (If Applicable).  If you need a refill on your cardiac medications before your next appointment, please call your pharmacy.

## 2016-10-17 NOTE — Progress Notes (Signed)
SUBJECTIVE: The patient presents for follow-up for palpitations and elevated blood pressure. She wore a Holter monitor and had an echocardiogram.  She also underwent a series of labs I ordered. Basic metabolic panel, magnesium, and TSH were all normal.  Lipid panel 10/05/16 showed elevated LDL, 132. Total cholesterol 197, triglycerides 101, HDL 45.  Holter monitoring demonstrated sinus rhythm with frequent PVCs, 70% of all beats.  Echocardiogram showed vigorous left ventricular systolic function, normal diastolic function, and normal regional wall motion, LVEF 65-70%.  I did start potassium and magnesium supplementation to aim for a potassium level greater than 4 and magnesium level greater than 2 in order to suppress PVCs.  She developed some dizziness and headaches with Toprol-XL and I switched her to diltiazem 30 mg twice daily.  Since starting potassium, magnesium, and diltiazem, she continues to feel palpitations but they are less frequent. She does have some exertional dyspnea when walking long distances. She denies ankle and feet swelling and also denies chest pain. She also denies dizziness and headaches.   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  Review of Systems: As per "subjective", otherwise negative.  Allergies  Allergen Reactions  . Flagyl [Metronidazole] Nausea And Vomiting  . Lactose Intolerance (Gi)   . Shellfish Allergy   . Topamax [Topiramate] Other (See Comments)  . Ciprofloxacin Hcl Nausea Only  . Diflucan [Fluconazole] Nausea And Vomiting    Current Outpatient Prescriptions  Medication Sig Dispense Refill  . cetirizine (ZYRTEC) 10 MG tablet Take 10 mg by mouth daily.    . cholecalciferol (VITAMIN D) 1000 units tablet Take 1,000 Units by mouth daily.    Marland Kitchen diltiazem (CARDIZEM) 30 MG tablet Take 1 tablet (30 mg total) by mouth 2 (two) times daily. Stop  taking Toprol-XL 60 tablet 6  . fluticasone (FLONASE) 50 MCG/ACT nasal spray Place into both nostrils as needed for allergies or rhinitis.    . Magnesium 200 MG TABS Take 1 tablet (200 mg total) by mouth daily. 90 each 3  . Multiple Vitamin (MULTIVITAMIN) tablet Take 1 tablet by mouth daily.    . ondansetron (ZOFRAN) 4 MG tablet Take 1 tablet (4 mg total) by mouth every 4 (four) hours as needed for nausea or vomiting. 30 tablet 2  . pantoprazole (PROTONIX) 20 MG tablet Take 20 mg by mouth daily.    . Polyethylene Glycol 3350 (MIRALAX PO) Take 17 g by mouth daily.    . potassium chloride SA (KLOR-CON M20) 20 MEQ tablet Take 1 tablet (20 mEq total) by mouth daily. 90 tablet 3  . rizatriptan (MAXALT-MLT) 10 MG disintegrating tablet Take 10 mg by mouth as needed for migraine. May repeat in 2 hours if needed     No current facility-administered medications for this visit.     Past Medical History:  Diagnosis Date  . Allergic rhinitis   . Fibroids   . Irregular heart beat   . Migraines   . Perimenopause 03/09/2014  . Reflux     Past Surgical History:  Procedure Laterality Date  . BREAST BIOPSY Left 2008   benign  . COLONOSCOPY  06/2011   Dr. Britta Mccreedy: per patient it was normal, follow up in 10 years.   . ESOPHAGOGASTRODUODENOSCOPY     Dr. Berneta Sages: late 1990s/early 2000  . ESOPHAGOGASTRODUODENOSCOPY N/A 05/19/2014   SLF:NO OBVIOUS SOURCE FOR DYSGEUSIA IDENTIFED/MILD Non-erosive  gastritis  . INGUINAL HERNIA REPAIR    . KNEE SURGERY Right   . TONSILLECTOMY AND ADENOIDECTOMY      Social History   Social History  . Marital status: Divorced    Spouse name: N/A  . Number of children: N/A  . Years of education: N/A   Occupational History  . 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 Smoking status: Never Smoker  . Smokeless tobacco: Never Used  . Alcohol use No  . Drug use: No  . Sexual activity: Yes    Birth control/ protection: None   Other Topics Concern  .  Not on file   Social History Narrative  . No narrative on file     Vitals:   10/17/16 0901  BP: (!) 128/98  Pulse: 76  SpO2: 98%  Weight: 144 lb (65.3 kg)  Height: 5\' 4"  (1.626 m)    Wt Readings from Last 3 Encounters:  10/17/16 144 lb (65.3 kg)  09/11/16 144 lb (65.3 kg)  07/09/16 147 lb (66.7 kg)     PHYSICAL EXAM General: NAD HEENT: Normal. Neck: No JVD, no thyromegaly. Lungs: Clear to auscultation bilaterally with normal respiratory effort. CV: Nondisplaced PMI.  Regular rate and rhythm with premature contractions, normal S1/S2, no S3/S4, no murmur. No pretibial or periankle edema.  No carotid bruit.   Abdomen: Soft, nontender, no distention.  Neurologic: Alert and oriented.  Psych: Normal affect. Skin: Normal. Musculoskeletal: No gross deformities.    ECG: Most recent ECG reviewed.   Labs: Lab Results  Component Value Date/Time   K 4.1 10/05/2016 09:16 AM   BUN 11 10/05/2016 09:16 AM   CREATININE 0.71 10/05/2016 09:16 AM   TSH 0.759 09/11/2016 02:25 PM   HGB 11.8 (L) 09/11/2016 02:25 PM     Lipids: Lab Results  Component Value Date/Time   LDLCALC 132 (H) 10/05/2016 09:24 AM   CHOL 197 10/05/2016 09:24 AM   TRIG 101 10/05/2016 09:24 AM   HDL 45 10/05/2016 09:24 AM       ASSESSMENT AND PLAN: 1. Palpitations/frequent PVC's: Symptomatically improved on diltiazem 30 mg twice daily along with supplemental potassium and magnesium. I will check a potassium and magnesium level prior to her next appointment. I will also aim to control blood pressure as this is likely exacerbating her condition.  2. Elevated BP/hypertension: She appears to have essential hypertension as DBP is elevated to 98 mmHg today. I will start losartan 25 mg daily.    Disposition: Follow up 3 months  Kate Sable, M.D., F.A.C.C.

## 2016-10-24 ENCOUNTER — Telehealth: Payer: Self-pay | Admitting: *Deleted

## 2016-10-24 NOTE — Telephone Encounter (Signed)
Patient would like to know if she could have a note for work until she can adjust to medication? Spoke with Dr. Bronson Ing who states he does not feel her systems are coming from Losartan. Pt will need to be seen by PCP. Patient notified

## 2016-10-24 NOTE — Telephone Encounter (Signed)
BP and HR are normal. Does she have other systemic symptoms? Infection?

## 2016-10-24 NOTE — Telephone Encounter (Signed)
Patient calls and reports feeling dizzy, weakness and headache at 7/10. Pt denies SOB at this time. Pt reports BP lying 113/55 HR 74, Sitting 113/63 HR 80, Standing 113/72 HR 86. Pt is currently at work but will be going home soon. Please advise.

## 2016-10-25 DIAGNOSIS — Z6824 Body mass index (BMI) 24.0-24.9, adult: Secondary | ICD-10-CM | POA: Diagnosis not present

## 2016-10-25 DIAGNOSIS — I10 Essential (primary) hypertension: Secondary | ICD-10-CM | POA: Diagnosis not present

## 2016-11-02 ENCOUNTER — Telehealth: Payer: Self-pay | Admitting: Cardiovascular Disease

## 2016-11-02 NOTE — Telephone Encounter (Signed)
Patient would like to discuss medications and side effects / tg

## 2016-11-02 NOTE — Telephone Encounter (Signed)
Pt still having leg cramps and PVC's worse ,woke her up from sleep.I spoke with PA, Maryan Puls, she recommended patient take cardizem three times a day vs twice a day. I told her I would call her back on Monday with Dr Lora Havens.advice

## 2016-11-04 NOTE — Telephone Encounter (Signed)
30 mg TID diltiazem would be fine. What is BP?

## 2016-11-05 MED ORDER — LOSARTAN POTASSIUM 25 MG PO TABS: 25.0000 mg | ORAL_TABLET | Freq: Every day | ORAL | 3 refills | Status: DC

## 2016-11-05 MED ORDER — DILTIAZEM HCL 30 MG PO TABS: 30.0000 mg | ORAL_TABLET | Freq: Three times a day (TID) | ORAL | 3 refills | Status: DC

## 2016-11-05 NOTE — Telephone Encounter (Signed)
BP lowest 103/50,113/69 HR 71,   Palpitations better

## 2016-11-05 NOTE — Addendum Note (Signed)
Addended by: Barbarann Ehlers A on: 11/05/2016 09:16 AM   Modules accepted: Orders

## 2016-11-07 ENCOUNTER — Other Ambulatory Visit: Payer: Self-pay | Admitting: *Deleted

## 2016-11-07 MED ORDER — DILTIAZEM HCL 30 MG PO TABS: 30.0000 mg | ORAL_TABLET | Freq: Three times a day (TID) | ORAL | 3 refills | Status: DC

## 2016-11-07 MED ORDER — LOSARTAN POTASSIUM 25 MG PO TABS: 25.0000 mg | ORAL_TABLET | Freq: Every day | ORAL | 3 refills | Status: DC

## 2016-11-14 DIAGNOSIS — Z6824 Body mass index (BMI) 24.0-24.9, adult: Secondary | ICD-10-CM | POA: Diagnosis not present

## 2016-11-14 DIAGNOSIS — J019 Acute sinusitis, unspecified: Secondary | ICD-10-CM | POA: Diagnosis not present

## 2016-11-14 DIAGNOSIS — W57XXXA Bitten or stung by nonvenomous insect and other nonvenomous arthropods, initial encounter: Secondary | ICD-10-CM | POA: Diagnosis not present

## 2016-11-14 DIAGNOSIS — B373 Candidiasis of vulva and vagina: Secondary | ICD-10-CM | POA: Diagnosis not present

## 2016-11-14 MED FILL — LOSARTAN POTASSIUM 25 MG TA: 25 | 90 days supply | Qty: 90 | Fill #0

## 2016-11-19 DIAGNOSIS — Z23 Encounter for immunization: Secondary | ICD-10-CM | POA: Diagnosis not present

## 2016-11-19 DIAGNOSIS — E782 Mixed hyperlipidemia: Secondary | ICD-10-CM | POA: Diagnosis not present

## 2016-11-19 DIAGNOSIS — Z6823 Body mass index (BMI) 23.0-23.9, adult: Secondary | ICD-10-CM | POA: Diagnosis not present

## 2016-11-19 DIAGNOSIS — Z1212 Encounter for screening for malignant neoplasm of rectum: Secondary | ICD-10-CM | POA: Diagnosis not present

## 2016-11-19 DIAGNOSIS — G43909 Migraine, unspecified, not intractable, without status migrainosus: Secondary | ICD-10-CM | POA: Diagnosis not present

## 2016-11-19 DIAGNOSIS — E559 Vitamin D deficiency, unspecified: Secondary | ICD-10-CM | POA: Diagnosis not present

## 2016-11-19 DIAGNOSIS — K219 Gastro-esophageal reflux disease without esophagitis: Secondary | ICD-10-CM | POA: Diagnosis not present

## 2016-11-19 DIAGNOSIS — Z0001 Encounter for general adult medical examination with abnormal findings: Secondary | ICD-10-CM | POA: Diagnosis not present

## 2016-12-07 MED FILL — POTASSIUM CL ER 20 MEQ TABL: 20 | 90 days supply | Qty: 90 | Fill #1

## 2016-12-13 MED FILL — dilTIAZem HCL 30 MG TABS: 30 | 90 days supply | Qty: 270 | Fill #0

## 2016-12-17 ENCOUNTER — Encounter: Payer: 59 | Attending: Family Medicine | Admitting: Nutrition

## 2016-12-17 VITALS — Ht 63.0 in | Wt 140.0 lb

## 2016-12-17 DIAGNOSIS — I10 Essential (primary) hypertension: Secondary | ICD-10-CM | POA: Diagnosis not present

## 2016-12-17 DIAGNOSIS — E7800 Pure hypercholesterolemia, unspecified: Secondary | ICD-10-CM

## 2016-12-17 DIAGNOSIS — K219 Gastro-esophageal reflux disease without esophagitis: Secondary | ICD-10-CM

## 2016-12-17 DIAGNOSIS — E785 Hyperlipidemia, unspecified: Secondary | ICD-10-CM | POA: Diagnosis not present

## 2016-12-17 DIAGNOSIS — K3184 Gastroparesis: Secondary | ICD-10-CM

## 2016-12-17 NOTE — Progress Notes (Signed)
  Medical Nutrition Therapy:  Appt start time:0930  end time: 1000  Assessment:  Primary concerns today: Follow up on Gastroparesis and Reflux and weight loss. She notes she doesn't have any more reflux problems. Gastroparesis is much better also. Bowels more regular and not bloated. Lost 7 lbs. Lipid profile, BP and weight have improved dramatically. She feels much better.. Cooking more at home, cut out eating out, eating more vegetables.  Has cut out salt.  BP is down in the 110's/60's.  She started a garden and eating a lot more fresh fruits and vegetables. Eating more plant based diet. Has enjoyed recipes from [employer] and other plant based resources. Uses air fryer if she is craving fried chicken or fried items for the texture.     Increasing her exercise is on her agenda next.  Lipid Panel     Component Value Date/Time   CHOL 197 10/05/2016 0924   TRIG 101 10/05/2016 0924   HDL 45 10/05/2016 0924   CHOLHDL 4.4 10/05/2016 0924   VLDL 20 10/05/2016 0924   LDLCALC 132 (H) 10/05/2016 0924   CMP Latest Ref Rng & Units 10/05/2016 09/11/2016  Glucose 65 - 99 mg/dL 99 95  BUN 6 - 20 mg/dL 11 9  Creatinine 0.44 - 1.00 mg/dL 0.71 0.77  Sodium 135 - 145 mmol/L 135 135  Potassium 3.5 - 5.1 mmol/L 4.1 3.6  Chloride 101 - 111 mmol/L 106 104  CO2 22 - 32 mmol/L 23 26  Calcium 8.9 - 10.3 mg/dL 9.5 9.6   Preferred Learning Style:    Auditory  Visual  Hands on  Learning Readiness:    Ready  Change in progress  MEDICATIONS: See list   DIETARY INTAKE:.    24-hr recall:  B ( AM):  Salad or vegetables omelet and lemon water Snk ( AM): none L ( PM): Chicken and onion rings in air fryer.water D)  Honey nut Cheerios and a plum  Snk ( PM): fruit, Beverages: water,   Usual physical activity: working on joining a gym for exercise.  Estimated energy needs: 1500 calories 170 g carbohydrates 112 g protein 42 g fat  Progress Towards Goal(s):  In progress.   Nutritional  Diagnosis:  NB-1.1 Food and nutrition-related knowledge deficit As related to Gastroparesis and GERD.  As evidenced by bloating, EGD results and relux symptoms..    Intervention:  Nutrition counseling on diet for gastroparesis and Reflux. Benefits of exercise, eating small,  frequent meals. Avoid spicy acidic and high fat foods. Watching fiber in diet and eating smaller amounts more often. Reviewed nutiriton  education handouts on Gastroparesis and GERD. Meal planning and importance of balanced meals and increased fresh fruits and vegetables.  Goals 1. Maintain weight loss 2. Start exercise with sit ups 3-4 times per week 3. Aim for 10,000 steps a day 4. Get LDL less than 100.  Teaching Method Utilized:  Visual Auditory Hands on  Handouts given during visit include:  Nutrition Therapy for Gastroparesis  Nutrition Therapy for GERD  Meal Plan Card  Barriers to learning/adherence to lifestyle change: none  Demonstrated degree of understanding via:  Teach Back   Monitoring/Evaluation:  Dietary intake, exercise, meal planning, SBG, and body weight in 6 month to a year(s). Marland Kitchen

## 2016-12-17 NOTE — Patient Instructions (Addendum)
Goals 1. Maintain weight loss 2. Start exercise with sit ups 3-4 times per week 3. Aim for 10,000 steps a day 4. Get LDL less than 100.

## 2016-12-21 MED FILL — PANTOPRAZOLE SOD DR 40 MG T: 40 | 90 days supply | Qty: 90 | Fill #1

## 2016-12-21 MED FILL — POLYETHYLENE GLYCOL 3350 PO: 30 days supply | Qty: 1054 | Fill #1

## 2016-12-21 MED FILL — SM COMPLETE 50+ TABLET: 125 days supply | Qty: 125 | Fill #3

## 2016-12-23 ENCOUNTER — Emergency Department (HOSPITAL_COMMUNITY)
Admission: EM | Admit: 2016-12-23 | Discharge: 2016-12-23 | Disposition: A | Discharge status: Home / Self Care | Payer: PRIVATE HEALTH INSURANCE | Attending: Emergency Medicine | Admitting: Emergency Medicine

## 2016-12-23 ENCOUNTER — Encounter (HOSPITAL_COMMUNITY): Payer: Self-pay | Admitting: Emergency Medicine

## 2016-12-23 DIAGNOSIS — W03XXXA Other fall on same level due to collision with another person, initial encounter: Secondary | ICD-10-CM | POA: Insufficient documentation

## 2016-12-23 DIAGNOSIS — M549 Dorsalgia, unspecified: Secondary | ICD-10-CM

## 2016-12-23 DIAGNOSIS — Z79899 Other long term (current) drug therapy: Secondary | ICD-10-CM | POA: Insufficient documentation

## 2016-12-23 DIAGNOSIS — Y990 Civilian activity done for income or pay: Secondary | ICD-10-CM | POA: Diagnosis not present

## 2016-12-23 DIAGNOSIS — Y929 Unspecified place or not applicable: Secondary | ICD-10-CM | POA: Diagnosis not present

## 2016-12-23 DIAGNOSIS — Y939 Activity, unspecified: Secondary | ICD-10-CM | POA: Diagnosis not present

## 2016-12-23 DIAGNOSIS — S3992XA Unspecified injury of lower back, initial encounter: Secondary | ICD-10-CM | POA: Diagnosis present

## 2016-12-23 DIAGNOSIS — M5442 Lumbago with sciatica, left side: Secondary | ICD-10-CM | POA: Diagnosis not present

## 2016-12-23 MED ORDER — PREDNISONE 10 MG PO TABS: 10.0000 mg | ORAL_TABLET | Freq: Every day | ORAL | 0 refills | Status: DC

## 2016-12-23 NOTE — ED Provider Notes (Signed)
[facility] DEPT Provider Note   CSN: [id number] Arrival date & time: 12/23/16  0855     History   Chief Complaint Chief Complaint  Patient presents with  . Work Related Injury    HPI Rose Delgado is a 56 y.o. female.  Bilateral lower back pain left greater than right with radiation to the left knee with associated mid upper back pain bilaterally after a work-related injury yesterday. Patient was attempting to prevent a fall from a 300 pound patient when symptoms started. Severity of pain is moderate. Positioning and palpation make pain worse.      Past Medical History:  Diagnosis Date  . Allergic rhinitis   . Fibroids   . Irregular heart beat   . Migraines   . Perimenopause 03/09/2014  . Reflux     Patient Active Problem List   Diagnosis Date Noted  . Menorrhagia 05/28/2016  . Nausea with vomiting 07/14/2014  . Dysgeusia 03/11/2014  . GERD (gastroesophageal reflux disease) 03/11/2014  . Hot flashes 03/09/2014  . Perimenopause 03/09/2014  . Fibroids 03/09/2014    Past Surgical History:  Procedure Laterality Date  . BREAST BIOPSY Left 2008   benign  . COLONOSCOPY  06/2011   Dr. Britta Mccreedy: per patient it was normal, follow up in 10 years.   . ESOPHAGOGASTRODUODENOSCOPY     Dr. Berneta Sages: late 1990s/early 2000  . ESOPHAGOGASTRODUODENOSCOPY N/A 05/19/2014   SLF:NO OBVIOUS SOURCE FOR DYSGEUSIA IDENTIFED/MILD Non-erosive gastritis  . INGUINAL HERNIA REPAIR    . KNEE SURGERY Right   . TONSILLECTOMY AND ADENOIDECTOMY      OB History    Gravida Para Term Preterm AB Living   0             SAB TAB Ectopic Multiple Live Births                   Home Medications    Prior to Admission medications   Medication Sig Start Date End Date Taking? Authorizing Provider  cetirizine (ZYRTEC) 10 MG tablet Take 10 mg by mouth daily.    [provider]  cholecalciferol (VITAMIN D) 1000 units tablet Take 1,000 Units by mouth daily.    [provider]    diltiazem (CARDIZEM) 30 MG tablet Take 1 tablet (30 mg total) by mouth 3 (three) times daily. 11/07/16   Herminio Commons, MD  fluticasone (FLONASE) 50 MCG/ACT nasal spray Place into both nostrils as needed for allergies or rhinitis.    [provider]  losartan (COZAAR) 25 MG tablet Take 1 tablet (25 mg total) by mouth daily. 11/07/16 02/05/17  Herminio Commons, MD  Magnesium 200 MG TABS Take 1 tablet (200 mg total) by mouth daily. 09/13/16   Herminio Commons, MD  Multiple Vitamin (MULTIVITAMIN) tablet Take 1 tablet by mouth daily.    [provider]  ondansetron (ZOFRAN) 4 MG tablet Take 1 tablet (4 mg total) by mouth every 4 (four) hours as needed for nausea or vomiting. 07/14/14   Fields, Marga Melnick, MD  pantoprazole (PROTONIX) 20 MG tablet Take 20 mg by mouth daily.    [provider]  Polyethylene Glycol 3350 (MIRALAX PO) Take 17 g by mouth daily.    [provider]  potassium chloride SA (KLOR-CON M20) 20 MEQ tablet Take 1 tablet (20 mEq total) by mouth daily. 09/13/16 12/12/16  Herminio Commons, MD  predniSONE (DELTASONE) 10 MG tablet Take 1 tablet (10 mg total) by mouth daily with breakfast. 12/23/16  Nat Christen, MD  rizatriptan (MAXALT-MLT) 10 MG disintegrating tablet Take 10 mg by mouth as needed for migraine. May repeat in 2 hours if needed    [provider]    Family History Family History  Problem Relation Age of Onset  . Other Mother        glaucoma; pacemaker  . Hypertension Mother   . Other Father        aneursym  . Cancer Sister        biliary, deceased age 42  . Hypertension Sister   . Cancer Brother        lung  . Hypertension Brother   . Sleep apnea Brother   . Hypertension Brother   . Hypertension Brother   . Hypertension Sister   . Hypertension Sister   . Hypertension Sister   . Colon cancer Neg Hx     Social History Social History  Substance Use Topics  . Smoking status: Never Smoker  . Smokeless  tobacco: Never Used  . Alcohol use No     Allergies   Flagyl [metronidazole]; Lactose intolerance (gi); Shellfish allergy; Topamax [topiramate]; Ciprofloxacin hcl; and Diflucan [fluconazole]   Review of Systems Review of Systems  All other systems reviewed and are negative.    Physical Exam Updated Vital Signs BP 120/70 (BP Location: Left Arm)   Pulse 69   Temp 97.6 F (36.4 C) (Oral)   Resp 16   Ht 5\' 3"  (1.6 m)   Wt 63.5 kg (140 lb)   LMP 12/16/2016   SpO2 100%   BMI 24.80 kg/m   Physical Exam  Constitutional: She is oriented to person, place, and time. She appears well-developed and well-nourished.  HENT:  Head: Normocephalic and atraumatic.  Eyes: Conjunctivae are normal.  Neck: Neck supple.  Cardiovascular: Normal rate and regular rhythm.   Pulmonary/Chest: Effort normal and breath sounds normal.  Abdominal: Soft. Bowel sounds are normal.  Musculoskeletal:  Muscular tenderness in mid upper back and bilateral lower back left greater than right.  Pain with straight leg raise on left  Neurological: She is alert and oriented to person, place, and time.  Skin: Skin is warm and dry.  Psychiatric: She has a normal mood and affect. Her behavior is normal.  Nursing note and vitals reviewed.    ED Treatments / Results  Labs (all labs ordered are listed, but only abnormal results are displayed) Labs Reviewed - No data to display  EKG  EKG Interpretation None       Radiology No results found.  Procedures Procedures (including critical care time)  Medications Ordered in ED Medications - No data to display   Initial Impression / Assessment and Plan / ED Course  I have reviewed the triage vital signs and the nursing notes.  Pertinent labs & imaging results that were available during my care of the patient were reviewed by me and considered in my medical decision making (see chart for details).     Patient with work-related low back pain with radicular  symptoms on the left. Will Rx prednisone. Follow-up with occupational health. Patient may need an MRI if symptoms do not improve.  Final Clinical Impressions(s) / ED Diagnoses   Final diagnoses:  Acute left-sided low back pain with left-sided sciatica  Upper back pain    New Prescriptions New Prescriptions   PREDNISONE (DELTASONE) 10 MG TABLET    Take 1 tablet (10 mg total) by mouth daily with breakfast.     Nat Christen,  MD 12/23/16 1009

## 2016-12-23 NOTE — Discharge Instructions (Signed)
Rest. Cold pack for 2 days, then alternate heat and ice. Prescription for prednisone. Can also take Flexeril and Tylenol. If symptoms do not improve, he may need an MRI. Follow-up with employee health.

## 2016-12-23 NOTE — ED Triage Notes (Signed)
Patient c/o upper and lower back pain with bilateral arm pain and right knee pain. Patient states pain in back radiates into left leg. Per patient injured back yesterday while trying to catch a 300lb patient from falling yesterday at work. Denies falling.

## 2016-12-24 MED FILL — FLUTICASONE PROP 50 MCG SPR: 50 | 90 days supply | Qty: 48 | Fill #0

## 2016-12-31 ENCOUNTER — Other Ambulatory Visit: Payer: Self-pay | Admitting: *Deleted

## 2016-12-31 ENCOUNTER — Encounter: Payer: Self-pay | Admitting: *Deleted

## 2016-12-31 DIAGNOSIS — R002 Palpitations: Secondary | ICD-10-CM

## 2016-12-31 DIAGNOSIS — I10 Essential (primary) hypertension: Secondary | ICD-10-CM

## 2017-01-01 ENCOUNTER — Other Ambulatory Visit: Payer: Self-pay | Admitting: Cardiovascular Disease

## 2017-01-01 DIAGNOSIS — I10 Essential (primary) hypertension: Secondary | ICD-10-CM | POA: Diagnosis not present

## 2017-01-01 DIAGNOSIS — R002 Palpitations: Secondary | ICD-10-CM | POA: Diagnosis not present

## 2017-01-01 LAB — BASIC METABOLIC PANEL
BUN: 12 mg/dL (ref 7–25)
CO2: 29 mmol/L (ref 20–31)
Calcium: 9.7 mg/dL (ref 8.6–10.4)
Chloride: 104 mmol/L (ref 98–110)
Creat: 0.92 mg/dL (ref 0.50–1.05)
Glucose, Bld: 80 mg/dL (ref 65–99)
Potassium: 3.9 mmol/L (ref 3.5–5.3)
Sodium: 138 mmol/L (ref 135–146)

## 2017-01-01 LAB — MAGNESIUM: Magnesium: 2.2 mg/dL (ref 1.5–2.5)

## 2017-01-07 ENCOUNTER — Ambulatory Visit: Payer: 59 | Admitting: Nutrition

## 2017-01-14 ENCOUNTER — Encounter: Payer: Self-pay | Admitting: Cardiovascular Disease

## 2017-01-14 ENCOUNTER — Ambulatory Visit (INDEPENDENT_AMBULATORY_CARE_PROVIDER_SITE_OTHER): Payer: 59 | Admitting: Cardiovascular Disease

## 2017-01-14 VITALS — BP 117/76 | HR 78 | Ht 64.0 in | Wt 146.2 lb

## 2017-01-14 DIAGNOSIS — I10 Essential (primary) hypertension: Secondary | ICD-10-CM | POA: Diagnosis not present

## 2017-01-14 DIAGNOSIS — I493 Ventricular premature depolarization: Secondary | ICD-10-CM

## 2017-01-14 DIAGNOSIS — R002 Palpitations: Secondary | ICD-10-CM

## 2017-01-14 NOTE — Patient Instructions (Signed)

## 2017-01-14 NOTE — Progress Notes (Signed)
SUBJECTIVE: The patient presents for follow-up of palpitations and hypertension. She is feeling much better and is tolerating both diltiazem and losartan. She had a back related injury at work in late June and will soon be undergoing physical therapy. She denies chest pain, palpitations, and shortness of breath.   Review of Systems: As per "subjective", otherwise negative.  Allergies  Allergen Reactions  . Flagyl [Metronidazole] Nausea And Vomiting  . Lactose Intolerance (Gi)   . Shellfish Allergy   . Topamax [Topiramate] Other (See Comments)  . Ciprofloxacin Hcl Nausea Only  . Diflucan [Fluconazole] Nausea And Vomiting    Current Outpatient Prescriptions  Medication Sig Dispense Refill  . cetirizine (ZYRTEC) 10 MG tablet Take 10 mg by mouth daily.    . cholecalciferol (VITAMIN D) 1000 units tablet Take 1,000 Units by mouth daily.    . cyclobenzaprine (FLEXERIL) 10 MG tablet Take 1 tablet by mouth every 8 (eight) hours as needed.  0  . diltiazem (CARDIZEM) 30 MG tablet Take 1 tablet (30 mg total) by mouth 3 (three) times daily. 270 tablet 3  . fluticasone (FLONASE) 50 MCG/ACT nasal spray Place into both nostrils as needed for allergies or rhinitis.    Marland Kitchen losartan (COZAAR) 25 MG tablet Take 1 tablet (25 mg total) by mouth daily. 90 tablet 3  . Magnesium 250 MG TABS Take 1 tablet by mouth daily.    . Multiple Vitamin (MULTIVITAMIN) tablet Take 1 tablet by mouth daily.    . ondansetron (ZOFRAN) 4 MG tablet Take 1 tablet (4 mg total) by mouth every 4 (four) hours as needed for nausea or vomiting. 30 tablet 2  . pantoprazole (PROTONIX) 20 MG tablet Take 20 mg by mouth daily.    . Polyethylene Glycol 3350 (MIRALAX PO) Take 17 g by mouth daily.    . potassium chloride SA (KLOR-CON M20) 20 MEQ tablet Take 1 tablet (20 mEq total) by mouth daily. 90 tablet 3  . predniSONE (DELTASONE) 10 MG tablet Take 1 tablet (10 mg total) by mouth daily with breakfast. 18 tablet 0  . rizatriptan  (MAXALT-MLT) 10 MG disintegrating tablet Take 10 mg by mouth as needed for migraine. May repeat in 2 hours if needed     No current facility-administered medications for this visit.     Past Medical History:  Diagnosis Date  . Allergic rhinitis   . Fibroids   . Irregular heart beat   . Migraines   . Perimenopause 03/09/2014  . Reflux     Past Surgical History:  Procedure Laterality Date  . BREAST BIOPSY Left 2008   benign  . COLONOSCOPY  06/2011   Dr. Britta Mccreedy: per patient it was normal, follow up in 10 years.   . ESOPHAGOGASTRODUODENOSCOPY     Dr. Berneta Sages: late 1990s/early 2000  . ESOPHAGOGASTRODUODENOSCOPY N/A 05/19/2014   SLF:NO OBVIOUS SOURCE FOR DYSGEUSIA IDENTIFED/MILD Non-erosive gastritis  . INGUINAL HERNIA REPAIR    . KNEE SURGERY Right   . TONSILLECTOMY AND ADENOIDECTOMY      Social History   Social History  . Marital status: Divorced    Spouse name: N/A  . Number of children: N/A  . Years of education: N/A   Occupational History  . RN at [employer] History Main Topics  . Smoking status: Never Smoker  . Smokeless tobacco: Never Used  . Alcohol use No  . Drug use: No  . Sexual activity: Yes    Birth control/ protection:  None   Other Topics Concern  . Not on file   Social History Narrative  . No narrative on file     Vitals:   01/14/17 0818  BP: 117/76  Pulse: 78  Weight: 146 lb 3.2 oz (66.3 kg)  Height: 5\' 4"  (1.626 m)    Wt Readings from Last 3 Encounters:  01/14/17 146 lb 3.2 oz (66.3 kg)  12/23/16 140 lb (63.5 kg)  12/17/16 140 lb (63.5 kg)     PHYSICAL EXAM General: NAD HEENT: Normal. Neck: No JVD, no thyromegaly. Lungs: Clear to auscultation bilaterally with normal respiratory effort. CV: Nondisplaced PMI.  Regular rate and rhythm, normal S1/S2, no S3/S4, no murmur. No pretibial or periankle edema.  No carotid bruit.   Abdomen: Soft, nontender, no distention.  Neurologic: Alert and oriented.  Psych: Normal  affect. Skin: Normal. Musculoskeletal: No gross deformities.    ECG: Most recent ECG reviewed.   Labs: Lab Results  Component Value Date/Time   K 3.9 01/01/2017 07:56 AM   BUN 12 01/01/2017 07:56 AM   CREATININE 0.92 01/01/2017 07:56 AM   TSH 0.759 09/11/2016 02:25 PM   HGB 11.8 (L) 09/11/2016 02:25 PM   HGB 11.2 05/28/2016 02:32 PM     Lipids: Lab Results  Component Value Date/Time   LDLCALC 132 (H) 10/05/2016 09:24 AM   CHOL 197 10/05/2016 09:24 AM   TRIG 101 10/05/2016 09:24 AM   HDL 45 10/05/2016 09:24 AM       ASSESSMENT AND PLAN: 1. Palpitations/frequent PVC's: Symptomatically stable on diltiazem 30 mg TID. No changes.  2. Essential hypertension: Controlled on losartan 25 mg daily. No changes.     Disposition: Follow up 6 months   Kate Sable, M.D., F.A.C.C.

## 2017-01-18 ENCOUNTER — Ambulatory Visit (HOSPITAL_COMMUNITY): Payer: PRIVATE HEALTH INSURANCE | Attending: Family Medicine | Admitting: Physical Therapy

## 2017-01-18 DIAGNOSIS — M6281 Muscle weakness (generalized): Secondary | ICD-10-CM | POA: Diagnosis present

## 2017-01-18 DIAGNOSIS — M5442 Lumbago with sciatica, left side: Secondary | ICD-10-CM | POA: Insufficient documentation

## 2017-01-18 DIAGNOSIS — R29898 Other symptoms and signs involving the musculoskeletal system: Secondary | ICD-10-CM | POA: Insufficient documentation

## 2017-01-18 NOTE — Patient Instructions (Signed)
   BRIDGING  While lying on your back, tighten your lower abdominals, squeeze your buttocks and then raise your buttocks off the floor/bed as creating a "Bridge" with your body.  Make sure that your left leg is closer to your rear than the right for this exercise.  Repeat 10 times, 2-3 times per day.    SINGLE KNEE TO CHEST STRETCH - [place]  While Lying on your back, hold your knee and gently pull it up towards your chest.  Hold for 10 seconds then relax.  Repeat 5 times each side, 2-3 times a day.    Lumbar Rotations   Lying on your back with your knees bent, slowly drop your legs to one side and hold the stretch for 10 seconds. Come back to the middle and switch sides, repeat. You should feel the stretch in your back on the opposite side that your legs are leaning.  Repeat 5 times each side with 10 second holds, 2-3 times per day.

## 2017-01-18 NOTE — Therapy (Signed)
[facility] [place], [place], [address] Phone: [phone]   Fax:  [phone]  Physical Therapy Evaluation  Patient Details  Name: Rose Delgado MRN: [id number] Date of Birth: 29-Jan-1961 Referring Provider: Odis Luster   Encounter Date: 01/18/2017      PT End of Session - 01/18/17 1747    Visit Number 1   Number of Visits 6   Date for PT Re-Evaluation 02/11/17  this will be visit #6   Authorization Type Workers Comp (6 visits at eval)   Authorization Time Period 01/18/17 to 02/11/17   Authorization - Visit Number 1   Authorization - Number of Visits 6   PT Start Time 1610  late/had to check in    PT Stop Time 1642   PT Time Calculation (min) 32 min   Activity Tolerance Patient tolerated treatment well   Behavior During Therapy [facility] for tasks assessed/performed      Past Medical History:  Diagnosis Date  . Allergic rhinitis   . Fibroids   . Irregular heart beat   . Migraines   . Perimenopause 03/09/2014  . Reflux     Past Surgical History:  Procedure Laterality Date  . BREAST BIOPSY Left 2008   benign  . COLONOSCOPY  06/2011   Dr. Britta Mccreedy: per patient it was normal, follow up in 10 years.   . ESOPHAGOGASTRODUODENOSCOPY     Dr. Berneta Sages: late 1990s/early 2000  . ESOPHAGOGASTRODUODENOSCOPY N/A 05/19/2014   SLF:NO OBVIOUS SOURCE FOR DYSGEUSIA IDENTIFED/MILD Non-erosive gastritis  . INGUINAL HERNIA REPAIR    . KNEE SURGERY Right   . TONSILLECTOMY AND ADENOIDECTOMY      There were no vitals filed for this visit.       Subjective Assessment - 01/18/17 1610    Subjective patient injured her back while trying to keep a large patient from jumping out of bed in the ED on 6/28; she has had some pain since then, this past Monday was the worst, starting by shoting to her knee then down to her toes. Her pain increases throughout the day at work. No numbness in bowel or bladder region. She is on light duty at  work.    Pertinent History no significant PMH, PSH    How long can you sit comfortably? has to shift quite a bit, gets weights off of L LE    How long can you stand comfortably? shifts back and forth otherwise uncomfrotable    How long can you walk comfortably? discomfort during walking    Patient Stated Goals be pain free and get back to normal routine    Currently in Pain? Yes   Pain Score 6    Pain Location Other (Comment)  thoacic and lumbar spines    Pain Orientation Left   Pain Descriptors / Indicators Stabbing;Shooting;Burning   Pain Type Acute pain   Pain Radiating Towards down L LE to toes    Pain Onset 1 to 4 weeks ago   Pain Frequency Constant   Aggravating Factors  being up and working through the day    Pain Relieving Factors heat to some extent    Effect of Pain on Daily Activities severe impact             [facility] PT Assessment - 01/18/17 0001      Assessment   Medical Diagnosis lumbar and thoracic sprain, sciatica    Referring Provider Odis Luster    Onset Date/Surgical Date 12/27/16  Next MD Visit Dr. Geoffry Paradise August 8th (occupational health)   Prior Therapy none      Precautions   Precautions Back   Precaution Comments per MD: may lift, push, pull up to 15#. Can advance as pt feels able.      Balance Screen   Has the patient fallen in the past 6 months No   Has the patient had a decrease in activity level because of a fear of falling?  No   Is the patient reluctant to leave their home because of a fear of falling?  No     Prior Function   Level of Independence Independent;Independent with basic ADLs;Independent with transfers;Independent with gait   Vocation Full time employment   Vocation Requirements ER RN    Leisure work in garden      Observation/Other Assessments   Observations LLD noted L slightly longer than right, reduced by bridging and MET      Posture/Postural Control   Posture/Postural Control Postural limitations   Postural  Limitations Rounded Shoulders;Forward head;Increased lumbar lordosis     AROM   Lumbar Flexion mild limitation; RFIS pulling low back    Lumbar Extension WFL; REIS increases peripheralization    Lumbar - Right Side Bend WFL    Lumbar - Left Side [facility]    Thoracic Flexion mild limitation    Thoracic Extension moderate limtation    Thoracic - Right Side Bend WFL    Thoracic - Left Side Bend moderate limitation    Thoracic - Right Rotation [facility]    Thoracic - Left Rotation moderate limitation     Strength   Right Hip Flexion 4/5   Right Hip Extension 4+/5   Right Hip ABduction 4/5   Left Hip Flexion 3+/5   Left Hip Extension 3/5   Left Hip ABduction 4/5   Right Knee Flexion 4/5   Right Knee Extension 4+/5   Left Knee Flexion 4-/5   Left Knee Extension 4/5   Right Ankle Dorsiflexion 5/5   Left Ankle Dorsiflexion 5/5     Palpation   Spinal mobility significant muscle spasm L paraspinals; hypomobility low Lumbar spine    SI assessment  hypomobile             Objective measurements completed on examination: See above findings.                  PT Education - 01/18/17 1747    Education provided Yes   Education Details exam findings, POC, HEP    Person(s) Educated Patient   Methods Demonstration;Explanation   Comprehension Verbalized understanding;Returned demonstration;Need further instruction          PT Short Term Goals - 01/18/17 1754      PT SHORT TERM GOAL #1   Title Patient to demonstrate ROM as being full and non-painful in all planes lumbar and thoracic spines in order to improve mechanics and QOL    Time 1   Period Weeks   Status New     PT SHORT TERM GOAL #2   Title Patient to be compliant with correct performance of HEP, to be updated PRN    Time 1   Period Weeks   Status New           PT Long Term Goals - 01/18/17 1755      PT LONG TERM GOAL #1   Title Patient to demonstrate strength as being 5/5 in all tested muscle groups  in order to reduce  compensation patterns and pain    Time 3   Period Weeks   Status New     PT LONG TERM GOAL #2   Title Patient to experience pain as being no more than 2/10 in order to improve QOL and work tolerance    Time 3   Period Weeks   Status New     PT LONG TERM GOAL #3   Title Patient to demonstrate correct patient transfer mechanics in order to prevent exacerbation or recurrence of condition    Time 3   Period Weeks   Status New     PT LONG TERM GOAL #4   Title Patient to report she has been able to return to full work duties with no pain exacerbation in order to assist in return to PLOF    Time [place] - 01/18/17 1749    Clinical Impression Statement Patient arrives approximately 3 weeks after injuring her back trying to keep a large patient from jumping out of an ED bed at work; she reports ongoing difficulty with pain in her L LE that is getting worse and she actually requires a "butt pillow" to offload her L side to sit comfortably. She continues to work however is on lighter duty right now. Examination reveals significant compensation patterns likely related to significant strength loss especially L LE, spasm L paraspinals and hypomobility of spine, difficulty walking, and reduced ability to participate in PLOF based tasks. She will benefit from skilled PT services to address deficits, reduce pain, and assist in return to optimal level of function.    History and Personal Factors relevant to plan of care: high PLOF, no significant PMH/PSH, active job, specific mode of injury    Clinical Presentation Stable   Clinical Presentation due to: biomechanical injury    Clinical Decision Making Low   Rehab Potential Good   Clinical Impairments Affecting Rehab Potential (+) high PLOF, motivated to participate, clear mode of injury    PT Frequency Other (comment)  6 visits including eval as her work schedule allows; re-assess  on 8/13    PT Duration Other (comment)  6 visits including eval as her work shcedule allows; re-assess on 8/13    PT Treatment/Interventions ADLs/Self Care Home Management;Biofeedback;Cryotherapy;Electrical Stimulation;Moist Heat;Traction;Ultrasound;Gait training;Stair training;Functional mobility training;Therapeutic activities;Therapeutic exercise;Balance training;Neuromuscular re-education;Patient/family education;Manual techniques;Passive range of motion;Dry needling;Taping   PT Next Visit Plan review initial eval/goals; manual to paraspinal spasm, PTs do lumbar and thoracic PAs. Postural training. Strength with focus on L LE to reduce compensations. Trial POE.    PT Home Exercise Plan Eval: bridges, SKTC, lumbar rotations, 3D thoracic excursions    Consulted and Agree with Plan of Care Patient      Patient will benefit from skilled therapeutic intervention in order to improve the following deficits and impairments:  Abnormal gait, Improper body mechanics, Pain, Decreased mobility, Increased muscle spasms, Postural dysfunction, Decreased range of motion, Decreased strength, Hypomobility, Difficulty walking  Visit Diagnosis: Acute left-sided low back pain with left-sided sciatica - Plan: PT plan of care cert/re-cert  Muscle weakness (generalized) - Plan: PT plan of care cert/re-cert  Other symptoms and signs involving the musculoskeletal system - Plan: PT plan of care cert/re-cert     Problem List Patient Active Problem List   Diagnosis Date Noted  . Menorrhagia 05/28/2016  . Nausea with vomiting 07/14/2014  . Dysgeusia  03/11/2014  . GERD (gastroesophageal reflux disease) 03/11/2014  . Hot flashes 03/09/2014  . Perimenopause 03/09/2014  . Fibroids 03/09/2014    Deniece Ree PT, DPT [place] [address] [place], [place], [address] Phone: [phone]   Fax:  [phone]  Name: Rose Delgado MRN:  [id number] Date of Birth: June 04, 1961

## 2017-01-21 ENCOUNTER — Telehealth (HOSPITAL_COMMUNITY): Payer: Self-pay | Admitting: Family Medicine

## 2017-01-21 NOTE — Telephone Encounter (Signed)
01/21/17 called to offer an appointment for 7/26 at 8:15.... Left her a messge

## 2017-01-22 ENCOUNTER — Telehealth (HOSPITAL_COMMUNITY): Payer: Self-pay | Admitting: Family Medicine

## 2017-01-22 NOTE — Telephone Encounter (Signed)
01/22/17  pt called to reschedule because she is working on this date

## 2017-01-29 ENCOUNTER — Ambulatory Visit (HOSPITAL_COMMUNITY): Payer: PRIVATE HEALTH INSURANCE | Admitting: Physical Therapy

## 2017-01-29 DIAGNOSIS — R29898 Other symptoms and signs involving the musculoskeletal system: Secondary | ICD-10-CM

## 2017-01-29 DIAGNOSIS — M5442 Lumbago with sciatica, left side: Secondary | ICD-10-CM | POA: Diagnosis not present

## 2017-01-29 DIAGNOSIS — M6281 Muscle weakness (generalized): Secondary | ICD-10-CM

## 2017-01-29 NOTE — Therapy (Signed)
[place], [place], [address] Phone: [phone]   Fax:  [phone]  Physical Therapy Treatment  Patient Details  Name: Rose Delgado MRN: [id number] Date of Birth: 1960/08/17 Referring Provider: Odis Luster   Encounter Date: 01/29/2017      PT End of Session - 01/29/17 0857    Visit Number 2   Number of Visits 6   Date for PT Re-Evaluation 02/11/17   Authorization Type Workers Comp (6 visits at eval)   Authorization Time Period 01/18/17 to 02/11/17   Authorization - Visit Number 2   Authorization - Number of Visits 6   PT Start Time 0816   PT Stop Time 0856   PT Time Calculation (min) 40 min   Activity Tolerance Patient tolerated treatment well   Behavior During Therapy [facility] for tasks assessed/performed      Past Medical History:  Diagnosis Date  . Allergic rhinitis   . Fibroids   . Irregular heart beat   . Migraines   . Perimenopause 03/09/2014  . Reflux     Past Surgical History:  Procedure Laterality Date  . BREAST BIOPSY Left 2008   benign  . COLONOSCOPY  06/2011   Dr. Britta Mccreedy: per patient it was normal, follow up in 10 years.   . ESOPHAGOGASTRODUODENOSCOPY     Dr. Berneta Sages: late 1990s/early 2000  . ESOPHAGOGASTRODUODENOSCOPY N/A 05/19/2014   SLF:NO OBVIOUS SOURCE FOR DYSGEUSIA IDENTIFED/MILD Non-erosive gastritis  . INGUINAL HERNIA REPAIR    . KNEE SURGERY Right   . TONSILLECTOMY AND ADENOIDECTOMY      There were no vitals filed for this visit.      Subjective Assessment - 01/29/17 0818    Subjective Patient arrives stating she is diong well, her HEP seems to be helping. Her pain seems to be improving but can be painful mostly by the end of the day at work.    Pertinent History no significant PMH, PSH    Patient Stated Goals be pain free and get back to normal routine    Currently in Pain? No/denies                         [facility] Adult PT Treatment/Exercise -  01/29/17 0001      Exercises   Exercises Lumbar     Lumbar Exercises: Stretches   Single Knee to Chest Stretch 5 reps;10 seconds   Double Knee to Chest Stretch 3 reps;10 seconds   Lower Trunk Rotation 5 reps;10 seconds     Lumbar Exercises: Standing   Heel Raises 15 reps   Heel Raises Limitations heel and toe    Forward Lunge 10 reps   Forward Lunge Limitations 4 inch box UEs overhead    Other Standing Lumbar Exercises --     Lumbar Exercises: Supine   Dead Bug 15 reps   Dead Bug Limitations oppostie UE/LE    Bridge 15 reps   Bridge Limitations 2 second holds    Straight Leg Raise 10 reps   Straight Leg Raises Limitations core set, eccentric lower   cues to reduce lumbar lordosis    Other Supine Lumbar Exercises --     Lumbar Exercises: Sidelying   Hip Abduction 10 reps   Hip Abduction Weights (lbs) cues for form      Lumbar Exercises: Prone   Straight Leg Raise 10 reps   Straight Leg Raises Limitations cues for form  Manual Therapy   Manual Therapy Soft tissue mobilization   Manual therapy comments performed separately from all other skilled services    Soft tissue mobilization L lumbar and thoracic paraspinals                 PT Education - 01/29/17 0857    Education provided Yes   Education Details review of initial eval/goals, possible DOMS and managment strategies    Person(s) Educated Patient   Methods Explanation;Handout   Comprehension Verbalized understanding          PT Short Term Goals - 01/18/17 1754      PT SHORT TERM GOAL #1   Title Patient to demonstrate ROM as being full and non-painful in all planes lumbar and thoracic spines in order to improve mechanics and QOL    Time 1   Period Weeks   Status New     PT SHORT TERM GOAL #2   Title Patient to be compliant with correct performance of HEP, to be updated PRN    Time 1   Period Weeks   Status New           PT Long Term Goals - 01/18/17 1755      PT LONG TERM GOAL #1    Title Patient to demonstrate strength as being 5/5 in all tested muscle groups in order to reduce compensation patterns and pain    Time 3   Period Weeks   Status New     PT LONG TERM GOAL #2   Title Patient to experience pain as being no more than 2/10 in order to improve QOL and work tolerance    Time 3   Period Weeks   Status New     PT LONG TERM GOAL #3   Title Patient to demonstrate correct patient transfer mechanics in order to prevent exacerbation or recurrence of condition    Time 3   Period Weeks   Status New     PT LONG TERM GOAL #4   Title Patient to report she has been able to return to full work duties with no pain exacerbation in order to assist in return to PLOF    Time [place] - 01/29/17 5102    Clinical Impression Statement Patient arrives today stating that she is feeling better, her HEP seems to be helping, but she continues to have pain by the end of the day at work. Reviewed initial eval/goals and then proceeded with functional lumbar mobility and core strengthening/stabilization this session with good form and tolerance noted by patient. Fatigue and poor endurance noted in core musculature today.    Rehab Potential Good   Clinical Impairments Affecting Rehab Potential (+) high PLOF, motivated to participate, clear mode of injury    PT Frequency Other (comment)  6 sessions including eval; reassess on 8/13    PT Duration Other (comment)  6 sessions including eval; reassess on 8/13    PT Treatment/Interventions ADLs/Self Care Home Management;Biofeedback;Cryotherapy;Electrical Stimulation;Moist Heat;Traction;Ultrasound;Gait training;Stair training;Functional mobility training;Therapeutic activities;Therapeutic exercise;Balance training;Neuromuscular re-education;Patient/family education;Manual techniques;Passive range of motion;Dry needling;Taping   PT Next Visit Plan continue manual L paraspinals; continue lumbar  and hip mobility/flexiblity, core strength and stabilization. PTs can trial lumbar and thoracic PAs. Reduce compensation patterns    PT Home Exercise Plan Eval: bridges, SKTC, lumbar rotations, 3D thoracic excursions    Consulted  and Agree with Plan of Care Patient      Patient will benefit from skilled therapeutic intervention in order to improve the following deficits and impairments:  Abnormal gait, Improper body mechanics, Pain, Decreased mobility, Increased muscle spasms, Postural dysfunction, Decreased range of motion, Decreased strength, Hypomobility, Difficulty walking  Visit Diagnosis: Acute left-sided low back pain with left-sided sciatica  Muscle weakness (generalized)  Other symptoms and signs involving the musculoskeletal system     Problem List Patient Active Problem List   Diagnosis Date Noted  . Menorrhagia 05/28/2016  . Nausea with vomiting 07/14/2014  . Dysgeusia 03/11/2014  . GERD (gastroesophageal reflux disease) 03/11/2014  . Hot flashes 03/09/2014  . Perimenopause 03/09/2014  . Fibroids 03/09/2014   Deniece Ree PT, DPT [place] [address] [place], [place], [address] Phone: [phone]   Fax:  [phone]  Name: Rose Delgado MRN: [id number] Date of Birth: 03-29-1961

## 2017-02-01 ENCOUNTER — Ambulatory Visit (HOSPITAL_COMMUNITY): Payer: PRIVATE HEALTH INSURANCE | Attending: Family Medicine | Admitting: Physical Therapy

## 2017-02-01 DIAGNOSIS — M6281 Muscle weakness (generalized): Secondary | ICD-10-CM | POA: Insufficient documentation

## 2017-02-01 DIAGNOSIS — R29898 Other symptoms and signs involving the musculoskeletal system: Secondary | ICD-10-CM | POA: Insufficient documentation

## 2017-02-01 DIAGNOSIS — M5442 Lumbago with sciatica, left side: Secondary | ICD-10-CM | POA: Diagnosis not present

## 2017-02-01 NOTE — Therapy (Signed)
[place], [place], [address] Phone: [phone]   Fax:  [phone]  Physical Therapy Treatment  Patient Details  Name: Rose Delgado MRN: [id number] Date of Birth: 04-27-1961 Referring Provider: Odis Luster   Encounter Date: 02/01/2017      PT End of Session - 02/01/17 0737    Visit Number 3   Number of Visits 6   Date for PT Re-Evaluation 02/11/17   Authorization Type Workers Comp (6 visits at eval)   Authorization Time Period 01/18/17 to 02/11/17   Authorization - Visit Number 3   Authorization - Number of Visits 6   PT Start Time 0731   PT Stop Time 0811   PT Time Calculation (min) 40 min   Activity Tolerance Patient tolerated treatment well;No increased pain   Behavior During Therapy WFL for tasks assessed/performed      Past Medical History:  Diagnosis Date  . Allergic rhinitis   . Fibroids   . Irregular heart beat   . Migraines   . Perimenopause 03/09/2014  . Reflux     Past Surgical History:  Procedure Laterality Date  . BREAST BIOPSY Left 2008   benign  . COLONOSCOPY  06/2011   Dr. Britta Mccreedy: per patient it was normal, follow up in 10 years.   . ESOPHAGOGASTRODUODENOSCOPY     Dr. Berneta Sages: late 1990s/early 2000  . ESOPHAGOGASTRODUODENOSCOPY N/A 05/19/2014   SLF:NO OBVIOUS SOURCE FOR DYSGEUSIA IDENTIFED/MILD Non-erosive gastritis  . INGUINAL HERNIA REPAIR    . KNEE SURGERY Right   . TONSILLECTOMY AND ADENOIDECTOMY      There were no vitals filed for this visit.      Subjective Assessment - 02/01/17 0733    Subjective Pt reports that things are going well. She states that her back has been a little sore at the end of a long work day. She will use the heat and take a warm bath which will usually help.    Pertinent History no significant PMH, PSH    Patient Stated Goals be pain free and get back to normal routine    Currently in Pain? No/denies            [facility] PT Assessment -  02/01/17 0001      AROM   Lumbar Flexion RFIS x10 reps   Lumbar Extension REIS x10 reps slight discomfort in low back; prone pressup x10 reps (end range discomfort),                [facility] Adult PT Treatment/Exercise - 02/01/17 0001      Self-Care   Self-Care Other Self-Care Comments   Other Self-Care Comments  lumbar roll setup and use      Exercises   Exercises Other Exercises   Other Exercises  seated trunk rotation Lt and Rt 2x15 reps each, red TB      Lumbar Exercises: Supine   Bent Knee Raise 10 reps   Bent Knee Raise Limitations x2 sets   Other Supine Lumbar Exercises low trunk rotation x15 reps each      Lumbar Exercises: Prone   Straight Leg Raise 15 reps   Opposite Arm/Leg Raise 10 reps;Left arm/Right leg;Right arm/Left leg     Manual Therapy   Manual therapy comments performed separately from all other skilled services    Soft tissue mobilization STM Lumbar paraspinals and QL                 PT Education -  02/01/17 6295    Education provided Yes   Education Details use of lumbar roll and encouraged pt to continue with HEP due to natural course of recovery.   Person(s) Educated Patient   Methods Explanation;Demonstration;Handout   Comprehension Returned demonstration;Verbalized understanding          PT Short Term Goals - 01/18/17 1754      PT SHORT TERM GOAL #1   Title Patient to demonstrate ROM as being full and non-painful in all planes lumbar and thoracic spines in order to improve mechanics and QOL    Time 1   Period Weeks   Status New     PT SHORT TERM GOAL #2   Title Patient to be compliant with correct performance of HEP, to be updated PRN    Time 1   Period Weeks   Status New           PT Long Term Goals - 01/18/17 1755      PT LONG TERM GOAL #1   Title Patient to demonstrate strength as being 5/5 in all tested muscle groups in order to reduce compensation patterns and pain    Time 3   Period Weeks   Status New      PT LONG TERM GOAL #2   Title Patient to experience pain as being no more than 2/10 in order to improve QOL and work tolerance    Time 3   Period Weeks   Status New     PT LONG TERM GOAL #3   Title Patient to demonstrate correct patient transfer mechanics in order to prevent exacerbation or recurrence of condition    Time 3   Period Weeks   Status New     PT LONG TERM GOAL #4   Title Patient to report she has been able to return to full work duties with no pain exacerbation in order to assist in return to PLOF    Time 3   Period [place] - 02/01/17 0737    Clinical Impression Statement Conitnued with therex to promote pain free movement and core strength. Pt able to complete exercises with reports of increased muscle spasm with increased reps. Ended session with manual techniques to decrease pain and pt reported feeling much improved by the end of the session.    Rehab Potential Good   Clinical Impairments Affecting Rehab Potential (+) high PLOF, motivated to participate, clear mode of injury    PT Frequency Other (comment)  6 sessions including eval; reassess on 8/13    PT Duration Other (comment)  6 sessions including eval; reassess on 8/13    PT Treatment/Interventions ADLs/Self Care Home Management;Biofeedback;Cryotherapy;Electrical Stimulation;Moist Heat;Traction;Ultrasound;Gait training;Stair training;Functional mobility training;Therapeutic activities;Therapeutic exercise;Balance training;Neuromuscular re-education;Patient/family education;Manual techniques;Passive range of motion;Dry needling;Taping   PT Next Visit Plan focus on extension based therex; consider providing pt with repeated prone extension for home, continue to progress core strength and stability    PT Home Exercise Plan Eval: bridges, SKTC, lumbar rotations, 3D thoracic excursions, lumbar roll    Consulted and Agree with Plan of Care Patient      Patient will benefit from  skilled therapeutic intervention in order to improve the following deficits and impairments:  Abnormal gait, Improper body mechanics, Pain, Decreased mobility, Increased muscle spasms, Postural dysfunction, Decreased range of motion, Decreased strength, Hypomobility, Difficulty walking  Visit Diagnosis: Acute left-sided low back pain  with left-sided sciatica  Muscle weakness (generalized)  Other symptoms and signs involving the musculoskeletal system     Problem List Patient Active Problem List   Diagnosis Date Noted  . Menorrhagia 05/28/2016  . Nausea with vomiting 07/14/2014  . Dysgeusia 03/11/2014  . GERD (gastroesophageal reflux disease) 03/11/2014  . Hot flashes 03/09/2014  . Perimenopause 03/09/2014  . Fibroids 03/09/2014     8:20 AM,02/01/17 Elly Modena PT, DPT [name] [place] [address] [place], [place], [address] Phone: [phone]   Fax:  [phone]  Name: Rose Delgado MRN: [id number] Date of Birth: 03/14/1961

## 2017-02-06 ENCOUNTER — Ambulatory Visit (HOSPITAL_COMMUNITY): Payer: PRIVATE HEALTH INSURANCE | Admitting: Physical Therapy

## 2017-02-06 ENCOUNTER — Other Ambulatory Visit: Payer: Self-pay | Admitting: Occupational Medicine

## 2017-02-06 DIAGNOSIS — M5442 Lumbago with sciatica, left side: Secondary | ICD-10-CM | POA: Diagnosis not present

## 2017-02-06 DIAGNOSIS — M6281 Muscle weakness (generalized): Secondary | ICD-10-CM

## 2017-02-06 DIAGNOSIS — S335XXD Sprain of ligaments of lumbar spine, subsequent encounter: Secondary | ICD-10-CM

## 2017-02-06 DIAGNOSIS — R29898 Other symptoms and signs involving the musculoskeletal system: Secondary | ICD-10-CM

## 2017-02-06 NOTE — Therapy (Signed)
[place], [place], [address] Phone: [phone]   Fax:  [phone]  Physical Therapy Treatment  Patient Details  Name: Rose Delgado MRN: [id number] Date of Birth: 08/26/1960 Referring Provider: Odis Luster   Encounter Date: 02/06/2017      PT End of Session - 02/06/17 0847    Visit Number 4   Number of Visits 6   Date for PT Re-Evaluation 02/11/17   Authorization Type Workers Comp (6 visits at eval)   Authorization Time Period 01/18/17 to 02/11/17   Authorization - Visit Number 4   Authorization - Number of Visits 6   PT Start Time [phone]   PT Stop Time 0845  patient had to leave early for MD appointment    PT Time Calculation (min) 29 min   Activity Tolerance Patient tolerated treatment well;No increased pain   Behavior During Therapy WFL for tasks assessed/performed      Past Medical History:  Diagnosis Date  . Allergic rhinitis   . Fibroids   . Irregular heart beat   . Migraines   . Perimenopause 03/09/2014  . Reflux     Past Surgical History:  Procedure Laterality Date  . BREAST BIOPSY Left 2008   benign  . COLONOSCOPY  06/2011   Dr. Britta Mccreedy: per patient it was normal, follow up in 10 years.   . ESOPHAGOGASTRODUODENOSCOPY     Dr. Berneta Sages: late 1990s/early 2000  . ESOPHAGOGASTRODUODENOSCOPY N/A 05/19/2014   SLF:NO OBVIOUS SOURCE FOR DYSGEUSIA IDENTIFED/MILD Non-erosive gastritis  . INGUINAL HERNIA REPAIR    . KNEE SURGERY Right   . TONSILLECTOMY AND ADENOIDECTOMY      There were no vitals filed for this visit.      Subjective Assessment - 02/06/17 0819    Subjective Patient arrives staing she felt OK after last session, however she had a very bad painful day Saturday. She is seeing her workers comp MD today.    Pertinent History no significant PMH, PSH    Patient Stated Goals be pain free and get back to normal routine    Currently in Pain? Yes   Pain Score 4    Pain Location Back    Pain Orientation Left   Pain Descriptors / Indicators Burning;Stabbing   Pain Type Acute pain   Pain Radiating Towards down L LE to toes    Pain Onset 1 to 4 weeks ago   Pain Frequency Constant   Aggravating Factors  working through the day    Pain Relieving Factors heat to some extent    Effect of Pain on Daily Activities moderate impact                          [facility] Adult PT Treatment/Exercise - 02/06/17 0001      Lumbar Exercises: Stretches   Single Knee to Chest Stretch 3 reps;10 seconds   Double Knee to Chest Stretch 3 reps;10 seconds   Lower Trunk Rotation 3 reps;10 seconds   Pelvic Tilt Limitations 10 reps 12-6 3 secodn holds, tactile cues    Quadruped Mid Back Stretch Limitations mad cat x10, 3 second holds     Lumbar Exercises: Standing   Other Standing Lumbar Exercises standing lumbar extensions 1x15     Manual Therapy   Manual Therapy Soft tissue mobilization   Manual therapy comments performed separately from all other skilled services    Soft tissue mobilization STM Lumbar paraspinals and QL  PT Education - 02/06/17 [phone]    Education provided Yes   Education Details HEP updates; ask if workers comp MD can approve more visits if possible; encouraged increased consistency with HEP    Person(s) Educated Patient   Methods Explanation;Handout   Comprehension Verbalized understanding          PT Short Term Goals - 01/18/17 1754      PT SHORT TERM GOAL #1   Title Patient to demonstrate ROM as being full and non-painful in all planes lumbar and thoracic spines in order to improve mechanics and QOL    Time 1   Period Weeks   Status New     PT SHORT TERM GOAL #2   Title Patient to be compliant with correct performance of HEP, to be updated PRN    Time 1   Period Weeks   Status New           PT Long Term Goals - 01/18/17 1755      PT LONG TERM GOAL #1   Title Patient to demonstrate strength as being 5/5 in  all tested muscle groups in order to reduce compensation patterns and pain    Time 3   Period Weeks   Status New     PT LONG TERM GOAL #2   Title Patient to experience pain as being no more than 2/10 in order to improve QOL and work tolerance    Time 3   Period Weeks   Status New     PT LONG TERM GOAL #3   Title Patient to demonstrate correct patient transfer mechanics in order to prevent exacerbation or recurrence of condition    Time 3   Period Weeks   Status New     PT LONG TERM GOAL #4   Title Patient to report she has been able to return to full work duties with no pain exacerbation in order to assist in return to PLOF    Time 3   Period Weeks   Status New               Plan - 02/06/17 0848    Clinical Impression Statement Patient arrives today stating that she has an appointment with worker's comp MDs later this morning, she requests to end session early so she has time to get to appointment. Performed general functional stretches this morning followed by increased stabilization and core strength as tolerated and as time allowed today. Encouraged patient to request more sessions from workers comp if this is possible; also encouraged increased consistency with HEP.    Rehab Potential Good   Clinical Impairments Affecting Rehab Potential (+) high PLOF, motivated to participate, clear mode of injury    PT Frequency Other (comment)  6 sessions including eval, reassess on 8/13    PT Duration Other (comment)  6 sessions including eval, reassess on 8/13    PT Treatment/Interventions ADLs/Self Care Home Management;Biofeedback;Cryotherapy;Electrical Stimulation;Moist Heat;Traction;Ultrasound;Gait training;Stair training;Functional mobility training;Therapeutic activities;Therapeutic exercise;Balance training;Neuromuscular re-education;Patient/family education;Manual techniques;Passive range of motion;Dry needling;Taping   PT Next Visit Plan continue lumbar mobility and  stretching, progress core strength/stability. Trial POE.    PT Home Exercise Plan Eval: bridges, SKTC, lumbar rotations, 3D thoracic excursions, lumbar roll 8/8 brace supine marches, dead bugs, TA activation    Consulted and Agree with Plan of Care Patient      Patient will benefit from skilled therapeutic intervention in order to improve the following deficits and impairments:  Abnormal gait, Improper body mechanics,  Pain, Decreased mobility, Increased muscle spasms, Postural dysfunction, Decreased range of motion, Decreased strength, Hypomobility, Difficulty walking  Visit Diagnosis: Acute left-sided low back pain with left-sided sciatica  Muscle weakness (generalized)  Other symptoms and signs involving the musculoskeletal system     Problem List Patient Active Problem List   Diagnosis Date Noted  . Menorrhagia 05/28/2016  . Nausea with vomiting 07/14/2014  . Dysgeusia 03/11/2014  . GERD (gastroesophageal reflux disease) 03/11/2014  . Hot flashes 03/09/2014  . Perimenopause 03/09/2014  . Fibroids 03/09/2014    Rose Delgado PT, DPT [place] [address] [place], [place], [address] Phone: [phone]   Fax:  [phone]  Name: Rose Delgado MRN: [id number] Date of Birth: 1960/11/27

## 2017-02-06 NOTE — Patient Instructions (Signed)
   BRACE SUPINE MARCHING  While lying on your back with your knees bent,  slowly raise up one foot a few inches and then set it back down.  Next, perform on your other leg.  Use your stomach muscles to keep your spine from moving or arching.  Repeat 10-15 times each leg, 1-2 times a day.    Transverse Abdominus Activation  Lying on your back, pull your bellybutton into your spine. It should feel like you are tensing all of your stomach muscles.  Hold for 3-5 seconds then relax.  You can perform this exercise in any position.  Repeat at least 20 times, at least 3-5 times per day.    Dead Bug  Lie on your back with your knees bent up and feet flat on the table. Keeping the core engaged, slowly lift one knee back, while the opposite arm goes back as well. Return to neutral and repeat with the other arm and leg.  Repeat 10 times each side, 1-2 times per day.

## 2017-02-07 MED FILL — LOSARTAN POTASSIUM 25 MG TA: 25 | 90 days supply | Qty: 90 | Fill #1

## 2017-02-08 ENCOUNTER — Ambulatory Visit (HOSPITAL_COMMUNITY): Payer: PRIVATE HEALTH INSURANCE

## 2017-02-08 ENCOUNTER — Encounter (HOSPITAL_COMMUNITY): Payer: Self-pay

## 2017-02-08 DIAGNOSIS — M5442 Lumbago with sciatica, left side: Secondary | ICD-10-CM

## 2017-02-08 DIAGNOSIS — R29898 Other symptoms and signs involving the musculoskeletal system: Secondary | ICD-10-CM

## 2017-02-08 DIAGNOSIS — M6281 Muscle weakness (generalized): Secondary | ICD-10-CM

## 2017-02-08 NOTE — Therapy (Signed)
[facility] [place], [place], [address] Phone: [phone]   Fax:  [phone]  Physical Therapy Treatment  Patient Details  Name: Rose Delgado MRN: [id number] Date of Birth: 1960/10/04 Referring Provider: Odis Luster   Encounter Date: 02/08/2017      PT End of Session - 02/08/17 0816    Visit Number 5   Number of Visits 6   Date for PT Re-Evaluation 02/11/17   Authorization Type Workers Comp (6 visits at eval)   Authorization Time Period 01/18/17 to 02/11/17   Authorization - Visit Number 5   Authorization - Number of Visits 6   PT Start Time 0815   PT Stop Time 2585   PT Time Calculation (min) 40 min   Activity Tolerance Patient tolerated treatment well;No increased pain   Behavior During Therapy WFL for tasks assessed/performed      Past Medical History:  Diagnosis Date  . Allergic rhinitis   . Fibroids   . Irregular heart beat   . Migraines   . Perimenopause 03/09/2014  . Reflux     Past Surgical History:  Procedure Laterality Date  . BREAST BIOPSY Left 2008   benign  . COLONOSCOPY  06/2011   Dr. Britta Mccreedy: per patient it was normal, follow up in 10 years.   . ESOPHAGOGASTRODUODENOSCOPY     Dr. Berneta Sages: late 1990s/early 2000  . ESOPHAGOGASTRODUODENOSCOPY N/A 05/19/2014   SLF:NO OBVIOUS SOURCE FOR DYSGEUSIA IDENTIFED/MILD Non-erosive gastritis  . INGUINAL HERNIA REPAIR    . KNEE SURGERY Right   . TONSILLECTOMY AND ADENOIDECTOMY      There were no vitals filed for this visit.      Subjective Assessment - 02/08/17 0816    Subjective Pt states that she is okay. She is still feeling the catching feeling in her lower back. Other than that, she is better. She is scheduled to have an MRI on 8/17.    Pertinent History no significant PMH, PSH    Patient Stated Goals be pain free and get back to normal routine    Currently in Pain? Yes   Pain Score 8   0/10 if she isn't moving, 8/10 when it catches   Pain Location Back   Pain Orientation Lower;Left;Mid   Pain Descriptors / Indicators Spasm  "catching"   Pain Type Acute pain   Pain Onset 1 to 4 weeks ago   Pain Frequency Constant   Aggravating Factors  working through the day   Pain Relieving Factors heat to some extent   Effect of Pain on Daily Activities moderate impact               [facility] Adult PT Treatment/Exercise - 02/08/17 0001      Lumbar Exercises: Stretches   Prone on Elbows Stretch Limitations x2 mins at beginning of session, no change in symptoms   Press Ups Limitations x20 reps   Prone Mid Back Stretch Limitations child's pose x 30 sec     Lumbar Exercises: Standing   Other Standing Lumbar Exercises bil SLS on foam with palov press and lifts with RTB x10 reps each     Lumbar Exercises: Supine   Other Supine Lumbar Exercises bent knee raise on 4" step with pulldowns with purple band x20 reps   Other Supine Lumbar Exercises -unilateral deadbugs with physioball x10 reps each     Lumbar Exercises: Quadruped   Opposite Arm/Leg Raise Right arm/Left leg;Left arm/Right leg;10 reps   Opposite Arm/Leg Raise  Limitations 2 sets     Manual Therapy   Manual Therapy Soft tissue mobilization   Manual therapy comments performed separately from all other skilled services    Soft tissue mobilization STM thoracic and lumbar paraspinals on L             PT Education - 02/08/17 0858    Education provided Yes   Education Details continue HEP, added child's pose to stretch thoracic paraspinals   Person(s) Educated Patient   Methods Explanation;Demonstration;Handout   Comprehension Verbalized understanding;Returned demonstration          PT Short Term Goals - 01/18/17 1754      PT SHORT TERM GOAL #1   Title Patient to demonstrate ROM as being full and non-painful in all planes lumbar and thoracic spines in order to improve mechanics and QOL    Time 1   Period Weeks   Status New     PT SHORT TERM GOAL #2    Title Patient to be compliant with correct performance of HEP, to be updated PRN    Time 1   Period Weeks   Status New           PT Long Term Goals - 01/18/17 1755      PT LONG TERM GOAL #1   Title Patient to demonstrate strength as being 5/5 in all tested muscle groups in order to reduce compensation patterns and pain    Time 3   Period Weeks   Status New     PT LONG TERM GOAL #2   Title Patient to experience pain as being no more than 2/10 in order to improve QOL and work tolerance    Time 3   Period Weeks   Status New     PT LONG TERM GOAL #3   Title Patient to demonstrate correct patient transfer mechanics in order to prevent exacerbation or recurrence of condition    Time 3   Period Weeks   Status New     PT LONG TERM GOAL #4   Title Patient to report she has been able to return to full work duties with no pain exacerbation in order to assist in return to PLOF    Time [place] - 02/08/17 0258    Clinical Impression Statement Session focused on core stability and decreasing soft tissue restrictions. Pt presented with increased reports of catching feeling in her upper thoracic to upper lumbar region. Pt did not c/o pain throughout therex but had palpable knots throughout thoracic and lumbar paraspinals which recreated her pain. Pt reported decreased catching sensation at EOS. She has MRI scheduled for 02/15/17. Continue POC as planned.   Rehab Potential Good   Clinical Impairments Affecting Rehab Potential (+) high PLOF, motivated to participate, clear mode of injury    PT Frequency Other (comment)  6 sessions including eval, reassess on 8/13    PT Duration Other (comment)  6 sessions including eval, reassess on 8/13    PT Treatment/Interventions ADLs/Self Care Home Management;Biofeedback;Cryotherapy;Electrical Stimulation;Moist Heat;Traction;Ultrasound;Gait training;Stair training;Functional mobility training;Therapeutic  activities;Therapeutic exercise;Balance training;Neuromuscular re-education;Patient/family education;Manual techniques;Passive range of motion;Dry needling;Taping   PT Next Visit Plan continue lumbar mobility and stretching, progress core strength/stability.    PT Home Exercise Plan Eval: bridges, SKTC, lumbar rotations, 3D thoracic excursions, lumbar roll 8/8 brace supine marches, dead bugs, TA activation; 8/10: child's pose  Consulted and Agree with Plan of Care Patient      Patient will benefit from skilled therapeutic intervention in order to improve the following deficits and impairments:  Abnormal gait, Improper body mechanics, Pain, Decreased mobility, Increased muscle spasms, Postural dysfunction, Decreased range of motion, Decreased strength, Hypomobility, Difficulty walking  Visit Diagnosis: Acute left-sided low back pain with left-sided sciatica  Muscle weakness (generalized)  Other symptoms and signs involving the musculoskeletal system     Problem List Patient Active Problem List   Diagnosis Date Noted  . Menorrhagia 05/28/2016  . Nausea with vomiting 07/14/2014  . Dysgeusia 03/11/2014  . GERD (gastroesophageal reflux disease) 03/11/2014  . Hot flashes 03/09/2014  . Perimenopause 03/09/2014  . Fibroids 03/09/2014     Geraldine Solar PT, DPT  [facility] [address] [place], [place], [address] Phone: [phone]   Fax:  [phone]  Name: Rose Delgado MRN: [id number] Date of Birth: 1960-09-05

## 2017-02-08 NOTE — Patient Instructions (Signed)
  CHILD POSE - PRAYER STRETCH - LATERAL  While on your hand and knees in a crawl position, slowly lower your buttocks towards your feet. Also, lower your chest towards the floor as you reach out towards the side.  Perform 1-2/day, 3-5 stretches holding for 30-60 seconds each

## 2017-02-11 ENCOUNTER — Ambulatory Visit (HOSPITAL_COMMUNITY): Payer: PRIVATE HEALTH INSURANCE | Admitting: Physical Therapy

## 2017-02-11 DIAGNOSIS — M6281 Muscle weakness (generalized): Secondary | ICD-10-CM

## 2017-02-11 DIAGNOSIS — M5442 Lumbago with sciatica, left side: Secondary | ICD-10-CM

## 2017-02-11 DIAGNOSIS — R29898 Other symptoms and signs involving the musculoskeletal system: Secondary | ICD-10-CM

## 2017-02-11 NOTE — Therapy (Signed)
[facility] [place], [place], [address] Phone: [phone]   Fax:  [phone]  Physical Therapy Treatment (Re-Assess)  Patient Details  Name: Rose Delgado MRN: [id number] Date of Birth: [birth year] Referring Provider: Odis Luster   Encounter Date: 02/11/2017      PT End of Session - 02/11/17 0907    Visit Number 6   Number of Visits 9   Date for PT Re-Evaluation 02/27/17   Authorization Type Workers Comp (6 visits at eval; 3 more approved on 8/13)   Authorization Time Period 07/25/56 to 0/99/83; recert done 3/82    Authorization - Visit Number 6   Authorization - Number of Visits 9   PT Start Time 0817   PT Stop Time 0857   PT Time Calculation (min) 40 min   Activity Tolerance Patient tolerated treatment well   Behavior During Therapy [facility] for tasks assessed/performed      Past Medical History:  Diagnosis Date  . Allergic rhinitis   . Fibroids   . Irregular heart beat   . Migraines   . Perimenopause 03/09/2014  . Reflux     Past Surgical History:  Procedure Laterality Date  . BREAST BIOPSY Left 2008   benign  . COLONOSCOPY  06/2011   Dr. Britta Mccreedy: per patient it was normal, follow up in 10 years.   . ESOPHAGOGASTRODUODENOSCOPY     Dr. Berneta Sages: late 1990s/early 2000  . ESOPHAGOGASTRODUODENOSCOPY N/A 05/19/2014   SLF:NO OBVIOUS SOURCE FOR DYSGEUSIA IDENTIFED/MILD Non-erosive gastritis  . INGUINAL HERNIA REPAIR    . KNEE SURGERY Right   . TONSILLECTOMY AND ADENOIDECTOMY      There were no vitals filed for this visit.      Subjective Assessment - 02/11/17 0820    Subjective Patient arrives stating shie is going to have an MRI on 8/17; she is 50% better overall. She felt very good after last session.    Pertinent History no significant PMH, PSH    How long can you sit comfortably? 8/13- unlimited but still shits off L LE    How long can you stand comfortably? 8/13- continues to need to shift back and  forth to be comfortable   How long can you walk comfortably? 8/13-  fairly comfortable    Patient Stated Goals be pain free and get back to normal routine    Currently in Pain? Yes   Pain Score 5    Pain Location Back   Pain Orientation Lower;Left   Pain Descriptors / Indicators Spasm;Shooting   Pain Type Acute pain   Pain Radiating Towards down to L knee    Pain Onset More than a month ago   Pain Frequency Constant            [facility] PT Assessment - 02/11/17 0001      Assessment   Medical Diagnosis lumbar and thoracic sprain, sciatica    Referring Provider Odis Luster    Onset Date/Surgical Date 12/27/16   Next MD Visit August 22nd    Prior Therapy none      Precautions   Precautions Back   Precaution Comments per MD: may lift, push, pull up to 15#. Can advance as pt feels able.      Balance Screen   Has the patient fallen in the past 6 months No   Has the patient had a decrease in activity level because of a fear of falling?  No   Is the patient reluctant to  leave their home because of a fear of falling?  No     Prior Function   Level of Independence Independent;Independent with basic ADLs;Independent with transfers;Independent with gait   Vocation Full time employment   Vocation Requirements ER RN      AROM   Lumbar Flexion WFL    Lumbar Extension WFL    Lumbar - Right Side Bend WFL    Lumbar - Left Side Bend [facility]      Strength   Right Hip Flexion 5/5   Right Hip Extension 4+/5   Right Hip ABduction 4+/5   Left Hip Extension 4/5   Left Hip ABduction 4+/5   Right Knee Flexion 4+/5   Right Knee Extension 4+/5   Left Knee Flexion 4/5   Left Knee Extension 4/5   Right Ankle Dorsiflexion 5/5   Left Ankle Dorsiflexion 5/5     Palpation   Palpation comment ongonig spasm L Lumbar paraspinals but improved since eval                      [facility] Adult PT Treatment/Exercise - 02/11/17 0001      Lumbar Exercises: Seated   Sit to Stand Limitations  seated on dynadisc: oppostie alternating UE/LE raises; lateral trunk crunches; cone reaches cross midline      Lumbar Exercises: Supine   Bridge 15 reps   Bridge Limitations bridge with LE kick      Lumbar Exercises: Prone   Other Prone Lumbar Exercises prone press up holds x2 minutes      Manual Therapy   Manual Therapy Soft tissue mobilization   Manual therapy comments performed separately from all other skilled services    Soft tissue mobilization STM thoracic and lumbar paraspinals on L                PT Education - 02/11/17 0907    Education provided Yes   Education Details schedule 3 more appts up to 8/22, HEP updates, progress with skilled PT services/towards goals    Person(s) Educated Patient   Methods Explanation;Handout   Comprehension Verbalized understanding;Returned demonstration;Need further instruction          PT Short Term Goals - 02/11/17 0827      PT SHORT TERM GOAL #1   Title Patient to demonstrate ROM as being full and non-painful in all planes lumbar and thoracic spines in order to improve mechanics and QOL    Baseline 8/13- full ROM not painful    Time 1   Period Weeks   Status Achieved     PT SHORT TERM GOAL #2   Title Patient to be compliant with correct performance of HEP, to be updated PRN    Baseline 8/13- compliant    Time 1   Period Weeks   Status Achieved           PT Long Term Goals - 02/11/17 3295      PT LONG TERM GOAL #1   Title Patient to demonstrate strength as being 5/5 in all tested muscle groups in order to reduce compensation patterns and pain    Baseline 8/13- improved but has not met goal    Time 3   Period Weeks   Status Partially Met     PT LONG TERM GOAL #2   Title Patient to experience pain as being no more than 2/10 in order to improve QOL and work tolerance    Baseline 8/13- if working can get to  8/10; if at home relaxing it can get to 4-5/10   Time 3   Period Weeks   Status On-going     PT LONG  TERM GOAL #3   Title Patient to demonstrate correct patient transfer mechanics in order to prevent exacerbation or recurrence of condition    Baseline 8/13- have not discussed    Time 3   Period Weeks   Status On-going     PT LONG TERM GOAL #4   Title Patient to report she has been able to return to full work duties with no pain exacerbation in order to assist in return to PLOF    Baseline 8/13- ongoing    Time 3   Period Weeks   Status On-going               Plan - 02/11/17 0908    Clinical Impression Statement Re-assessment performed today. Patient appears to be making objective progress with skilled PT services however would likely benefit from continued work with skilled PT interventions to continue focus on advanced core strengthening, transfer mechanics, and for ongoing work with soft tissue limitations. Recommend continuation of skilled PT services if workers comp allows/approves visits.    Rehab Potential Good   Clinical Impairments Affecting Rehab Potential (+) high PLOF, motivated to participate, clear mode of injury    PT Frequency Other (comment)  3 more visits per workers comp    PT Duration Other (comment)  3 more visits per workers comp    PT Treatment/Interventions ADLs/Self Care Home Management;Biofeedback;Cryotherapy;Electrical Stimulation;Moist Heat;Traction;Ultrasound;Gait training;Stair training;Functional mobility training;Therapeutic activities;Therapeutic exercise;Balance training;Neuromuscular re-education;Patient/family education;Manual techniques;Passive range of motion;Dry needling;Taping   PT Next Visit Plan continue lumbar mobility and stretching, progress core strength/stability. Need re-assess on 8/29.    PT Home Exercise Plan Eval: bridges, SKTC, lumbar rotations, 3D thoracic excursions, lumbar roll 8/8 brace supine marches, dead bugs, TA activation; 8/10: child's pose 8/13: bridge with LE kickout, opposite UE/LE flexion seated    Consulted and Agree  with Plan of Care Patient      Patient will benefit from skilled therapeutic intervention in order to improve the following deficits and impairments:  Abnormal gait, Improper body mechanics, Pain, Decreased mobility, Increased muscle spasms, Postural dysfunction, Decreased range of motion, Decreased strength, Hypomobility, Difficulty walking  Visit Diagnosis: Acute left-sided low back pain with left-sided sciatica - Plan: PT plan of care cert/re-cert  Muscle weakness (generalized) - Plan: PT plan of care cert/re-cert  Other symptoms and signs involving the musculoskeletal system - Plan: PT plan of care cert/re-cert     Problem List Patient Active Problem List   Diagnosis Date Noted  . Menorrhagia 05/28/2016  . Nausea with vomiting 07/14/2014  . Dysgeusia 03/11/2014  . GERD (gastroesophageal reflux disease) 03/11/2014  . Hot flashes 03/09/2014  . Perimenopause 03/09/2014  . Fibroids 03/09/2014    Deniece Ree PT, DPT [facility] [address] [place], [place], [address] Phone: [phone]   Fax:  [phone]  Name: Rose Delgado MRN: [id number] Date of Birth: 1961-02-22

## 2017-02-11 NOTE — Patient Instructions (Signed)
   [place] - ALT KNEE EXT  While lying on your back, raise your buttocks off the floor/bed into a bridge position.    Next straighten a leg so that only one leg is supporting your body. Then, return that leg back to the ground and change to the other side.  Relax/lower your hips back down, then repeat 10-15 times, 1 time per day.    SEATED ALTERNATE ARM AND LEG (YOU MAY DO AT THE EDGE OF YOUR BED)  While seated on an exercise ball, raise a leg and opposite arm. Return limbs back down and then raise the opposite side.  Repeat 10 times each side, 1-2 times per day.

## 2017-02-12 ENCOUNTER — Ambulatory Visit (HOSPITAL_COMMUNITY)
Admission: RE | Admit: 2017-02-12 | Discharge: 2017-02-12 | Disposition: A | Discharge status: Home / Self Care | Payer: PRIVATE HEALTH INSURANCE | Source: Ambulatory Visit | Attending: Occupational Medicine | Admitting: Occupational Medicine

## 2017-02-12 DIAGNOSIS — S335XXD Sprain of ligaments of lumbar spine, subsequent encounter: Secondary | ICD-10-CM | POA: Diagnosis not present

## 2017-02-12 DIAGNOSIS — M5136 Other intervertebral disc degeneration, lumbar region: Secondary | ICD-10-CM | POA: Diagnosis not present

## 2017-02-12 DIAGNOSIS — X58XXXD Exposure to other specified factors, subsequent encounter: Secondary | ICD-10-CM | POA: Insufficient documentation

## 2017-02-12 DIAGNOSIS — M5126 Other intervertebral disc displacement, lumbar region: Secondary | ICD-10-CM | POA: Diagnosis not present

## 2017-02-13 ENCOUNTER — Encounter (HOSPITAL_COMMUNITY): Payer: Self-pay

## 2017-02-14 ENCOUNTER — Other Ambulatory Visit (HOSPITAL_COMMUNITY): Payer: Self-pay

## 2017-02-15 ENCOUNTER — Ambulatory Visit (HOSPITAL_COMMUNITY): Payer: 59

## 2017-02-15 ENCOUNTER — Ambulatory Visit (HOSPITAL_COMMUNITY): Payer: PRIVATE HEALTH INSURANCE | Admitting: Physical Therapy

## 2017-02-15 DIAGNOSIS — M6281 Muscle weakness (generalized): Secondary | ICD-10-CM

## 2017-02-15 DIAGNOSIS — M5442 Lumbago with sciatica, left side: Secondary | ICD-10-CM | POA: Diagnosis not present

## 2017-02-15 DIAGNOSIS — R29898 Other symptoms and signs involving the musculoskeletal system: Secondary | ICD-10-CM

## 2017-02-15 NOTE — Therapy (Signed)
[facility] [place], [place], [address] Phone: [phone]   Fax:  [phone]  Physical Therapy Treatment  Patient Details  Name: Rose Delgado MRN: [id number] Date of Birth: 1960-11-19 Referring Provider: Odis Luster   Encounter Date: 02/15/2017      PT End of Session - 02/15/17 1035    Visit Number 7   Number of Visits 9   Date for PT Re-Evaluation 02/27/17   Authorization Type Workers Comp (6 visits at eval; 3 more approved on 8/13)   Authorization Time Period 9/83/38 to 2/50/53; recert done 9/76    Authorization - Visit Number 7   Authorization - Number of Visits 9   PT Start Time 0900   PT Stop Time 0944   PT Time Calculation (min) 44 min   Activity Tolerance Patient tolerated treatment well;No increased pain   Behavior During Therapy WFL for tasks assessed/performed      Past Medical History:  Diagnosis Date  . Allergic rhinitis   . Fibroids   . Irregular heart beat   . Migraines   . Perimenopause 03/09/2014  . Reflux     Past Surgical History:  Procedure Laterality Date  . BREAST BIOPSY Left 2008   benign  . COLONOSCOPY  06/2011   Dr. Britta Mccreedy: per patient it was normal, follow up in 10 years.   . ESOPHAGOGASTRODUODENOSCOPY     Dr. Berneta Sages: late 1990s/early 2000  . ESOPHAGOGASTRODUODENOSCOPY N/A 05/19/2014   SLF:NO OBVIOUS SOURCE FOR DYSGEUSIA IDENTIFED/MILD Non-erosive gastritis  . INGUINAL HERNIA REPAIR    . KNEE SURGERY Right   . TONSILLECTOMY AND ADENOIDECTOMY      There were no vitals filed for this visit.      Subjective Assessment - 02/15/17 0902    Subjective Pt reports that things continue to go about the same with her back. She says that her back pain is still pretty much there constantly but it's not as bad this morning.    Pertinent History no significant PMH, PSH    How long can you sit comfortably? 8/13- unlimited but still shits off L LE    How long can you stand  comfortably? 8/13- continues to need to shift back and forth to be comfortable   How long can you walk comfortably? 8/13-  fairly comfortable    Patient Stated Goals be pain free and get back to normal routine    Currently in Pain? Other (Comment)  No rating at this time.    Pain Onset More than a month ago                         [facility] Adult PT Treatment/Exercise - 02/15/17 0001      Exercises   Other Exercises  quad thoracic rotation with yellow TB resistance x10 reps each      Lumbar Exercises: Stretches   Lower Trunk Rotation 5 reps;10 seconds   Lower Trunk Rotation Limitations seated    Prone Mid Back Stretch Limitations Child's pose middle Lt and Rt 2x10 sec each      Lumbar Exercises: Supine   Other Supine Lumbar Exercises bent knee drop Lt/Rt x10 reps each (propped on green physioball)    Other Supine Lumbar Exercises Dead bug (bent knee opposite arm/leg) 2x5 reps each      Lumbar Exercises: Prone   Other Prone Lumbar Exercises prone pressup on elbows x20 reps      Lumbar Exercises:  Quadruped   Opposite Arm/Leg Raise Left arm/Right leg;Right arm/Left leg;10 reps     Manual Therapy   Manual therapy comments performed separately from all other skilled services    Soft tissue mobilization STM thoracic and lumbar paraspinals                PT Education - 02/15/17 1034    Education provided Yes   Education Details implications for exercises completed during session   Person(s) Educated Patient   Methods Explanation   Comprehension Verbalized understanding          PT Short Term Goals - 02/11/17 0827      PT SHORT TERM GOAL #1   Title Patient to demonstrate ROM as being full and non-painful in all planes lumbar and thoracic spines in order to improve mechanics and QOL    Baseline 8/13- full ROM not painful    Time 1   Period Weeks   Status Achieved     PT SHORT TERM GOAL #2   Title Patient to be compliant with correct performance of  HEP, to be updated PRN    Baseline 8/13- compliant    Time 1   Period Weeks   Status Achieved           PT Long Term Goals - 02/11/17 2800      PT LONG TERM GOAL #1   Title Patient to demonstrate strength as being 5/5 in all tested muscle groups in order to reduce compensation patterns and pain    Baseline 8/13- improved but has not met goal    Time 3   Period Weeks   Status Partially Met     PT LONG TERM GOAL #2   Title Patient to experience pain as being no more than 2/10 in order to improve QOL and work tolerance    Baseline 8/13- if working can get to 8/10; if at home relaxing it can get to 4-5/10   Time 3   Period Weeks   Status On-going     PT LONG TERM GOAL #3   Title Patient to demonstrate correct patient transfer mechanics in order to prevent exacerbation or recurrence of condition    Baseline 8/13- have not discussed    Time 3   Period Weeks   Status On-going     PT LONG TERM GOAL #4   Title Patient to report she has been able to return to full work duties with no pain exacerbation in order to assist in return to PLOF    Baseline 8/13- ongoing    Time 3   Period Weeks   Status On-going               Plan - 02/15/17 1036    Clinical Impression Statement Pt continues to report back spasm and discomfort throughout the day and with heavy activity. Session focused on therex and stretching to promote pain free movements and ended with soft tissue mobilization to address muscle spasm along the thoracic and lumbar paraspinals. Pt reporting no increase in pain following today's session, and therapist provided handout for local massage therapists.    Rehab Potential Good   Clinical Impairments Affecting Rehab Potential (+) high PLOF, motivated to participate, clear mode of injury    PT Frequency Other (comment)  3 more visits per workers comp    PT Duration Other (comment)  3 more visits per workers comp    PT Treatment/Interventions ADLs/Self Care Home  Management;Biofeedback;Cryotherapy;Electrical Stimulation;Moist Heat;Traction;Ultrasound;Gait training;Stair training;Functional  mobility training;Therapeutic activities;Therapeutic exercise;Balance training;Neuromuscular re-education;Patient/family education;Manual techniques;Passive range of motion;Dry needling;Taping   PT Next Visit Plan continue with lumbar mobility and stretching to encourage movement, Need re-assess on 8/29.    PT Home Exercise Plan Eval: bridges, SKTC, lumbar rotations, 3D thoracic excursions, lumbar roll 8/8 brace supine marches, dead bugs, TA activation; 8/10: child's pose 8/13: bridge with LE kickout, opposite UE/LE flexion seated    Consulted and Agree with Plan of Care Patient      Patient will benefit from skilled therapeutic intervention in order to improve the following deficits and impairments:  Abnormal gait, Improper body mechanics, Pain, Decreased mobility, Increased muscle spasms, Postural dysfunction, Decreased range of motion, Decreased strength, Hypomobility, Difficulty walking  Visit Diagnosis: Acute left-sided low back pain with left-sided sciatica  Muscle weakness (generalized)  Other symptoms and signs involving the musculoskeletal system     Problem List Patient Active Problem List   Diagnosis Date Noted  . Menorrhagia 05/28/2016  . Nausea with vomiting 07/14/2014  . Dysgeusia 03/11/2014  . GERD (gastroesophageal reflux disease) 03/11/2014  . Hot flashes 03/09/2014  . Perimenopause 03/09/2014  . Fibroids 03/09/2014    10:47 AM,02/15/17 Elly Modena PT, DPT [name] [address] [place], [place], [address] Phone: [phone]   Fax:  [phone]  Name: Rose Delgado MRN: [id number] Date of Birth: [birth year]

## 2017-02-20 ENCOUNTER — Ambulatory Visit (HOSPITAL_COMMUNITY): Payer: PRIVATE HEALTH INSURANCE

## 2017-02-20 ENCOUNTER — Encounter (HOSPITAL_COMMUNITY): Payer: Self-pay | Admitting: Physical Therapy

## 2017-02-20 ENCOUNTER — Telehealth (HOSPITAL_COMMUNITY): Payer: Self-pay | Admitting: Family Medicine

## 2017-02-20 NOTE — Therapy (Signed)
[place], [place], [address] Phone: [phone]   Fax:  [phone]  Patient Details  Name: Rose Delgado MRN: [id number] Date of Birth: Nov 24, 1960 Referring Provider:  No ref. provider found  Encounter Date: 02/20/2017  PHYSICAL THERAPY DISCHARGE SUMMARY  Visits from Start of Care: 7 Current functional level related to goals / functional outcomes: Patient has self discharged; reports she will be receiving spine injections and will not be returning to PT.    Remaining deficits: Unable to assess    Education / Equipment: N/A  Plan: Patient agrees to discharge.  Patient goals were partially met. Patient is being discharged due to the patient's request.  ?????       Deniece Ree PT, DPT [place], [place], [address] Phone: [phone]   Fax:  [phone]

## 2017-02-20 NOTE — Telephone Encounter (Signed)
02/20/17  pt called while at office re: W[employer] Comp and they are going to send to an orthopedist for injections and she said she wouldn't be back for therapy

## 2017-02-27 ENCOUNTER — Ambulatory Visit (HOSPITAL_COMMUNITY): Payer: PRIVATE HEALTH INSURANCE | Admitting: Physical Therapy

## 2017-03-01 MED FILL — RIZATRIPTAN 10 MG TABLET: 10 | 30 days supply | Qty: 18 | Fill #0

## 2017-03-01 MED FILL — POTASSIUM CL ER 20 MEQ TABL: 20 | 90 days supply | Qty: 90 | Fill #2

## 2017-03-08 MED FILL — dilTIAZem HCL 30 MG TABS: 30 | 90 days supply | Qty: 270 | Fill #1

## 2017-04-02 ENCOUNTER — Other Ambulatory Visit: Payer: Self-pay | Admitting: Obstetrics and Gynecology

## 2017-04-02 ENCOUNTER — Other Ambulatory Visit (HOSPITAL_COMMUNITY)
Admission: RE | Admit: 2017-04-02 | Discharge: 2017-04-02 | Disposition: A | Discharge status: Home / Self Care | Payer: 59 | Source: Ambulatory Visit | Attending: Obstetrics and Gynecology | Admitting: Obstetrics and Gynecology

## 2017-04-02 DIAGNOSIS — Z01818 Encounter for other preprocedural examination: Secondary | ICD-10-CM

## 2017-04-02 LAB — HCG, SERUM, QUALITATIVE: Preg, Serum: NEGATIVE

## 2017-04-02 MED FILL — FLUTICASONE PROP 50 MCG SPR: 50 | 90 days supply | Qty: 48 | Fill #1

## 2017-04-02 MED FILL — PANTOPRAZOLE SOD DR 40 MG T: 40 | 90 days supply | Qty: 90 | Fill #2

## 2017-04-15 ENCOUNTER — Other Ambulatory Visit (HOSPITAL_COMMUNITY): Payer: Self-pay | Admitting: Family Medicine

## 2017-04-15 DIAGNOSIS — Z1231 Encounter for screening mammogram for malignant neoplasm of breast: Secondary | ICD-10-CM

## 2017-04-22 DIAGNOSIS — G43909 Migraine, unspecified, not intractable, without status migrainosus: Secondary | ICD-10-CM | POA: Diagnosis not present

## 2017-04-22 DIAGNOSIS — J301 Allergic rhinitis due to pollen: Secondary | ICD-10-CM | POA: Diagnosis not present

## 2017-04-22 DIAGNOSIS — E782 Mixed hyperlipidemia: Secondary | ICD-10-CM | POA: Diagnosis not present

## 2017-04-22 DIAGNOSIS — K219 Gastro-esophageal reflux disease without esophagitis: Secondary | ICD-10-CM | POA: Diagnosis not present

## 2017-04-22 DIAGNOSIS — I10 Essential (primary) hypertension: Secondary | ICD-10-CM | POA: Diagnosis not present

## 2017-04-22 DIAGNOSIS — Z6825 Body mass index (BMI) 25.0-25.9, adult: Secondary | ICD-10-CM | POA: Diagnosis not present

## 2017-04-22 DIAGNOSIS — E559 Vitamin D deficiency, unspecified: Secondary | ICD-10-CM | POA: Diagnosis not present

## 2017-04-22 MED FILL — ALL DAY ALLERGY 10 MG TAB: 10 | 100 days supply | Qty: 100 | Fill #0

## 2017-04-22 MED FILL — HYDROCORTISONE 2.5% CREAM: 2.5 | 10 days supply | Qty: 30 | Fill #0

## 2017-04-22 MED FILL — TERCONAZOLE 0.4% VAG CREAM: 0.4 | 7 days supply | Qty: 45 | Fill #0

## 2017-04-24 ENCOUNTER — Ambulatory Visit (HOSPITAL_COMMUNITY): Payer: Self-pay

## 2017-04-24 ENCOUNTER — Ambulatory Visit (HOSPITAL_COMMUNITY)
Admission: RE | Admit: 2017-04-24 | Discharge: 2017-04-24 | Disposition: A | Discharge status: Home / Self Care | Payer: 59 | Source: Ambulatory Visit | Attending: Family Medicine | Admitting: Family Medicine

## 2017-04-24 ENCOUNTER — Encounter (HOSPITAL_COMMUNITY): Payer: Self-pay

## 2017-04-24 DIAGNOSIS — Z1231 Encounter for screening mammogram for malignant neoplasm of breast: Secondary | ICD-10-CM | POA: Insufficient documentation

## 2017-04-25 ENCOUNTER — Ambulatory Visit (HOSPITAL_COMMUNITY): Payer: Self-pay

## 2017-04-29 ENCOUNTER — Telehealth: Payer: Self-pay | Admitting: *Deleted

## 2017-04-29 NOTE — Progress Notes (Signed)
Pt.notified

## 2017-04-29 NOTE — Telephone Encounter (Signed)
Informed patient normal mammogram per Dr Glo Herring.

## 2017-04-29 NOTE — Telephone Encounter (Signed)
-----   Message from Jonnie Kind, MD sent at 04/28/2017  8:59 PM EDT ----- Normal mammogram

## 2017-05-01 ENCOUNTER — Ambulatory Visit: Payer: PRIVATE HEALTH INSURANCE | Attending: Orthopedic Surgery | Admitting: Physical Therapy

## 2017-05-01 DIAGNOSIS — M545 Low back pain: Secondary | ICD-10-CM | POA: Diagnosis not present

## 2017-05-01 NOTE — Therapy (Signed)
[facility] [place], [place], [address] Phone: [phone]   Fax:  [phone]  Physical Therapy Evaluation  Patient Details  Name: Rose Delgado MRN: [id number] Date of Birth: 05/06/1961 Referring Provider: Phylliss Bob MD.  Encounter Date: 05/01/2017      PT End of Session - 05/01/17 1252    Visit Number 1   Number of Visits 8   Date for PT Re-Evaluation 05/29/17   PT Start Time 0905   PT Stop Time 0955   PT Time Calculation (min) 50 min   Activity Tolerance Patient tolerated treatment well   Behavior During Therapy [facility] for tasks assessed/performed      Past Medical History:  Diagnosis Date  . Allergic rhinitis   . Fibroids   . Irregular heart beat   . Migraines   . Perimenopause 03/09/2014  . Reflux     Past Surgical History:  Procedure Laterality Date  . BREAST BIOPSY Left 2008   benign  . COLONOSCOPY  06/2011   Dr. Britta Mccreedy: per patient it was normal, follow up in 10 years.   . ESOPHAGOGASTRODUODENOSCOPY     Dr. Berneta Sages: late 1990s/early 2000  . ESOPHAGOGASTRODUODENOSCOPY N/A 05/19/2014   SLF:NO OBVIOUS SOURCE FOR DYSGEUSIA IDENTIFED/MILD Non-erosive gastritis  . INGUINAL HERNIA REPAIR    . KNEE SURGERY Right   . TONSILLECTOMY AND ADENOIDECTOMY      There were no vitals filed for this visit.       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  She describes an extension injury to her spine.  She had a few PT visits previously but states it didn't help much.  She recently had an injection but reported only temporary relief of symptoms.  Her pain-level is a 6/10 today and she reports pain that radiates into her left LE and will at times go below her left knee to her calf.  Twisting and extension increases her pain.  Medication and rest decrease pain.   Pertinent History MRI reveals some lower lumbar disc  degeneration.   Limitations Standing;Sitting   How long can you sit comfortably? 15-20 minutes.   How long can you stand comfortably? 10-15 minutes.   Diagnostic tests MRI.   Patient Stated Goals Get back to normal with no back pain.   Currently in Pain? Yes   Pain Score 6    Pain Location Back   Pain Orientation Right;Left;Lower;Mid   Pain Descriptors / Indicators Aching   Pain Type Acute pain   Pain Onset More than a month ago   Pain Frequency Constant   Aggravating Factors  See above.   Pain Relieving Factors See above.            [facility] PT Assessment - 05/01/17 0001      Assessment   Medical Diagnosis Lumbar facet arthritis.   Referring Provider Phylliss Bob MD.   Onset Date/Surgical Date --  12/22/16.     Precautions   Precautions None   Precaution Comments MD order:  Flexion based program.     Restrictions   Weight Bearing Restrictions No     Balance Screen   Has the patient fallen in the past 6 months No   Has the patient had a decrease in activity level because of a fear of falling?  No   Is the patient reluctant to leave their home because of a  fear of falling?  No     Home [employer] residence     Prior Function   Level of Independence Independent     Posture/Postural Control   Posture/Postural Control No significant limitations     ROM / Strength   AROM / PROM / Strength AROM;Strength     AROM   Overall AROM Comments Active lumbar flexion limited by 50% and lumbar extension= 20 degrees.     Strength   Overall Strength Comments Normal bilateral LE strength.     Palpation   Palpation comment Patient c/o pain over bilateral SIJ's and diffusely bilaterally from L4 to S1.     Special Tests    Special Tests Lumbar;Leg LengthTest  1+/4+ bilateral LE's.   Lumbar Tests --  (-) SLR testing.  (-)FABER testing.   Leg length test  --  (=) leg lengths.     Ambulation/Gait   Gait Comments WNL.             Objective measurements completed on examination: See above findings.          [facility] Adult PT Treatment/Exercise - 05/01/17 0001      Modalities   Modalities Electrical Stimulation;Moist Heat     Moist Heat Therapy   Number Minutes Moist Heat 20 Minutes   Moist Heat Location Lumbar Spine     Electrical Stimulation   Electrical Stimulation Location Low back.   Electrical Stimulation Action IFC   Electrical Stimulation Parameters 80-150 hz x 20 minutes.   Electrical Stimulation Goals Pain                     PT Long Term Goals - 05/01/17 1638      PT LONG TERM GOAL #1   Title Independent with a HEP.   Time 4   Period Weeks   Status New     PT LONG TERM GOAL #2   Title Perform ADL's with pain not > 2/10.   Time 4   Period Weeks   Status New     PT LONG TERM GOAL #3   Title Eliminate left LE symptoms.   Time 4   Status New                Plan - 05/01/17 1624    Clinical Impression Statement The patient presents to OPPT with a work related injury that occured on 12/22/16.  She injured her low back while attempting to help a large patient that resulted in extending her spine.  She has pain across her low back at L4 to S1 and c/o pain with palpation over both SIJ.s  The patient has c/o pain/symptoms into her left LE.  Her pain has prevented her from resuming her normal job duites and ADL's.  Patient will benefit from skilled physical therapy.   Clinical Presentation Stable   Clinical Decision Making Low   Rehab Potential Good   PT Frequency 2x / week   PT Duration 4 weeks   PT Treatment/Interventions ADLs/Self Care Home Management;Cryotherapy;Electrical Stimulation;Moist Heat;Ultrasound;Patient/family education;Therapeutic exercise;Therapeutic activities;Manual techniques;Dry needling   PT Next Visit Plan Flexion based core exercises.  Electrical stimulation and HMP; Combo e'stim/U/S and STW/M especially to SI ligaments.  Dry needling  is an option as well as int traction beginning at 35% BW.   Consulted and Agree with Plan of Care Patient      Patient will benefit from skilled therapeutic intervention in order to improve the  following deficits and impairments:  Pain, Decreased activity tolerance, Decreased range of motion  Visit Diagnosis: Acute bilateral low back pain, with sciatica presence unspecified - Plan: PT plan of care cert/re-cert     Problem List Patient Active Problem List   Diagnosis Date Noted  . Menorrhagia 05/28/2016  . Nausea with vomiting 07/14/2014  . Dysgeusia 03/11/2014  . GERD (gastroesophageal reflux disease) 03/11/2014  . Hot flashes 03/09/2014  . Perimenopause 03/09/2014  . Fibroids 03/09/2014    Tyshia Fenter, [place] MPT 05/01/2017, 4:48 PM  [facility] [address] [place], [place], [address] Phone: [phone]   Fax:  [phone]  Name: Rose Delgado MRN: [id number] Date of Birth: 1961-05-08

## 2017-05-07 ENCOUNTER — Encounter: Payer: Self-pay | Admitting: Physical Therapy

## 2017-05-07 ENCOUNTER — Ambulatory Visit: Payer: PRIVATE HEALTH INSURANCE | Attending: Orthopedic Surgery | Admitting: Physical Therapy

## 2017-05-07 DIAGNOSIS — R29898 Other symptoms and signs involving the musculoskeletal system: Secondary | ICD-10-CM | POA: Diagnosis present

## 2017-05-07 DIAGNOSIS — M545 Low back pain: Secondary | ICD-10-CM | POA: Diagnosis not present

## 2017-05-07 DIAGNOSIS — M5442 Lumbago with sciatica, left side: Secondary | ICD-10-CM

## 2017-05-07 DIAGNOSIS — M6281 Muscle weakness (generalized): Secondary | ICD-10-CM

## 2017-05-07 NOTE — Therapy (Signed)
[facility] [place], [place], [address] Phone: [phone]   Fax:  [phone]  Physical Therapy Treatment  Patient Details  Name: Rose Delgado MRN: [id number] Date of Birth: 06/03/1961 Referring Provider: Phylliss Bob MD.   Encounter Date: 05/07/2017  PT End of Session - 05/07/17 1227    Visit Number  2    Number of Visits  8    Date for PT Re-Evaluation  05/29/17    PT Start Time  0900    PT Stop Time  0935    PT Time Calculation (min)  35 min    Activity Tolerance  Patient tolerated treatment well    Behavior During Therapy  [facility] for tasks assessed/performed       Past Medical History:  Diagnosis Date  . Allergic rhinitis   . Fibroids   . Irregular heart beat   . Migraines   . Perimenopause 03/09/2014  . Reflux     Past Surgical History:  Procedure Laterality Date  . BREAST BIOPSY Left 2008   benign  . COLONOSCOPY  06/2011   Dr. Britta Mccreedy: per patient it was normal, follow up in 10 years.   . ESOPHAGOGASTRODUODENOSCOPY     Dr. Berneta Sages: late 1990s/early 2000  . INGUINAL HERNIA REPAIR    . KNEE SURGERY Right   . TONSILLECTOMY AND ADENOIDECTOMY      There were no vitals filed for this visit.  Subjective Assessment - 05/07/17 1225    Subjective  Pt arriving to therapy reporting 6/10 LBP. Pt still reporting radiating down left LE.     Pertinent History  MRI reveals some lower lumbar disc degeneration.    Limitations  Standing;Sitting    How long can you sit comfortably?  15-20 minutes.    How long can you stand comfortably?  10-15 minutes.    Diagnostic tests  MRI.    Patient Stated Goals  Get back to normal with no back pain.    Currently in Pain?  Yes    Pain Score  6     Pain Location  Back    Pain Orientation  Mid;Lower;Left    Pain Descriptors / Indicators  Aching;Shooting;Discomfort    Pain Type  Acute pain    Pain Onset  More than a month ago    Pain Frequency  Constant    Aggravating  Factors   certain movements    Pain Relieving Factors  nothing         [facility] PT Assessment - 05/07/17 0001      Assessment   Medical Diagnosis  Lumbar facet arthritis.      Precautions   Precautions  None      Restrictions   Weight Bearing Restrictions  No      Balance Screen   Has the patient fallen in the past 6 months  No                  [facility] Adult PT Treatment/Exercise - 05/07/17 0001      Posture/Postural Control   Posture Comments  Discussed lifting posture and techniques      Modalities   Modalities  Traction      Traction   Type of Traction  Lumbar    Min (lbs)  5    Max (lbs)  50    Hold Time  99    Rest Time  5    Time  20 minutes  PT Education - 05/07/17 1231    Education provided  Yes    Education Details  Discussed posture stabilizaiton and core strengthening to prevent further injury    Person(s) Educated  Patient    Methods  Explanation    Comprehension  Verbalized understanding          PT Long Term Goals - 05/07/17 1231      PT LONG TERM GOAL #1   Title  Independent with a HEP.    Baseline  8/13- improved but has not met goal     Time  4    Period  Weeks    Status  On-going      PT LONG TERM GOAL #2   Title  Perform ADL's with pain not > 2/10.    Baseline  8/13- if working can get to 8/10; if at home relaxing it can get to 4-5/10    Status  On-going      PT LONG TERM GOAL #3   Title  Eliminate left LE symptoms.    Baseline  8/13- have not discussed     Status  New      PT LONG TERM GOAL #4   Title  Patient to report she has been able to return to full work duties with no pain exacerbation in order to assist in return to PLOF     Baseline  8/13- ongoing     Time  3    Period  Weeks    Status  On-going            Plan - 05/07/17 1228    Clinical Impression Statement  Pt presenting today reporting 6-7/10 back pain which increases with movements. Mechanical Traction performed and reporting  decreased pain to 4-5/10. Continue skilled PT to progress pt toward her PLOF and goals set.     PT Frequency  2x / week    PT Duration  4 weeks    PT Treatment/Interventions  ADLs/Self Care Home Management;Cryotherapy;Electrical Stimulation;Moist Heat;Ultrasound;Patient/family education;Therapeutic exercise;Therapeutic activities;Manual techniques;Dry needling    PT Next Visit Plan  Flexion based core exercises.  Electrical stimulation and HMP; Combo e'stim/U/S and STW/M especially to SI ligaments.  Dry needling is an option as well as int traction beginning at 35% BW.    Consulted and Agree with Plan of Care  Patient       Patient will benefit from skilled therapeutic intervention in order to improve the following deficits and impairments:  Pain, Decreased activity tolerance, Decreased range of motion  Visit Diagnosis: Acute bilateral low back pain, with sciatica presence unspecified  Acute left-sided low back pain with left-sided sciatica  Muscle weakness (generalized)  Other symptoms and signs involving the musculoskeletal system     Problem List Patient Active Problem List   Diagnosis Date Noted  . Menorrhagia 05/28/2016  . Nausea with vomiting 07/14/2014  . Dysgeusia 03/11/2014  . GERD (gastroesophageal reflux disease) 03/11/2014  . Hot flashes 03/09/2014  . Perimenopause 03/09/2014  . Fibroids 03/09/2014    Oretha Caprice, MPT 05/07/2017, 12:35 PM  [facility] [place], [place], [address] Phone: [phone]   Fax:  [phone]  Name: Rose Delgado MRN: [id number] Date of Birth: [birth year]

## 2017-05-13 MED FILL — LOSARTAN POTASSIUM 25 MG TA: 25 | 90 days supply | Qty: 90 | Fill #2

## 2017-05-14 ENCOUNTER — Encounter: Payer: Self-pay | Admitting: Physical Therapy

## 2017-05-14 ENCOUNTER — Ambulatory Visit: Payer: PRIVATE HEALTH INSURANCE | Admitting: Physical Therapy

## 2017-05-14 DIAGNOSIS — M545 Low back pain: Secondary | ICD-10-CM | POA: Diagnosis not present

## 2017-05-14 DIAGNOSIS — M5442 Lumbago with sciatica, left side: Secondary | ICD-10-CM

## 2017-05-14 DIAGNOSIS — M6281 Muscle weakness (generalized): Secondary | ICD-10-CM

## 2017-05-14 DIAGNOSIS — R29898 Other symptoms and signs involving the musculoskeletal system: Secondary | ICD-10-CM

## 2017-05-14 NOTE — Therapy (Signed)
[facility] [place], [place], [address] Phone: [phone]   Fax:  [phone]  Physical Therapy Treatment  Patient Details  Name: Rose Delgado MRN: [id number] Date of Birth: Dec 19, 1960 Referring Provider: Phylliss Bob MD.   Encounter Date: 05/14/2017  PT End of Session - 05/14/17 0937    Visit Number  3    Number of Visits  8    Date for PT Re-Evaluation  05/29/17    PT Start Time  0900    PT Stop Time  0958    PT Time Calculation (min)  58 min    Activity Tolerance  Patient tolerated treatment well    Behavior During Therapy  [facility] for tasks assessed/performed       Past Medical History:  Diagnosis Date  . Allergic rhinitis   . Fibroids   . Irregular heart beat   . Migraines   . Perimenopause 03/09/2014  . Reflux     Past Surgical History:  Procedure Laterality Date  . BREAST BIOPSY Left 2008   benign  . COLONOSCOPY  06/2011   Dr. Britta Mccreedy: per patient it was normal, follow up in 10 years.   . ESOPHAGOGASTRODUODENOSCOPY     Dr. Berneta Sages: late 1990s/early 2000  . INGUINAL HERNIA REPAIR    . KNEE SURGERY Right   . TONSILLECTOMY AND ADENOIDECTOMY      There were no vitals filed for this visit.  Subjective Assessment - 05/14/17 0934    Subjective  pt arriving to therapy reporting 7/10 LBP. Pt still reporting radiation down left glute. Pt reports having to take a muscle relaxor, pain meds and has to sleep with heat on her back in order to sleep.     Pertinent History  MRI reveals some lower lumbar disc degeneration.    Limitations  Standing;Sitting    How long can you sit comfortably?  15-20 minutes.    How long can you stand comfortably?  10-15 minutes.    Diagnostic tests  MRI.    Patient Stated Goals  Get back to normal with no back pain.    Currently in Pain?  Yes    Pain Score  7     Pain Location  Back    Pain Orientation  Lower    Pain Descriptors / Indicators  Aching    Pain Type  Acute pain     Pain Onset  More than a month ago    Aggravating Factors   certain movements                      [facility] Adult PT Treatment/Exercise - 05/14/17 0001      Exercises   Exercises  Lumbar      Lumbar Exercises: Aerobic   Stationary Bike  Nustep x 6 minutes L4      Modalities   Modalities  Electrical Stimulation;Iontophoresis;Traction      Moist Heat Therapy   Number Minutes Moist Heat  15 Minutes    Moist Heat Location  Lumbar Spine      Electrical Stimulation   Electrical Stimulation Location  lumbar spine    Electrical Stimulation Action  IFC    Electrical Stimulation Parameters  80-150 Hz x 15 minutes    Electrical Stimulation Goals  Pain      Traction   Type of Traction  Lumbar    Min (lbs)  5    Max (lbs)  55    Hold Time  99    Rest Time  5    Time  20 minutes      Manual Therapy   Manual Therapy  Soft tissue mobilization    Manual therapy comments  STW to lumbar paraspinals             PT Education - 05/14/17 0936    Education provided  Yes    Education Details  posture          PT Long Term Goals - 05/14/17 0940      PT LONG TERM GOAL #1   Title  Independent with a HEP.    Baseline  8/13- improved but has not met goal     Time  4    Period  Weeks    Status  On-going      PT LONG TERM GOAL #2   Title  Perform ADL's with pain not > 2/10.    Baseline  8/13- if working can get to 8/10; if at home relaxing it can get to 4-5/10    Time  4    Period  Weeks      PT LONG TERM GOAL #3   Title  Eliminate left LE symptoms.    Baseline  8/13- have not discussed     Period  Weeks    Status  New      PT LONG TERM GOAL #4   Title  Patient to report she has been able to return to full work duties with no pain exacerbation in order to assist in return to PLOF     Baseline  8/13- ongoing     Time  3    Period  Weeks    Status  On-going            Plan - 05/14/17 3559    Clinical Impression Statement  pt presenting today  reporting 7/10 pain in low back and upper gluteal region. pt tolerated Nustep for initial warmup, STW was performed to lumbar paraspinals followed by heat and E-stim. Session was ended with mechanical traction. Pt reporting some relief at end of session. Continue skilled PT as pt tolerates.     Clinical Decision Making  Low    Rehab Potential  Good    PT Frequency  2x / week    PT Duration  4 weeks    PT Treatment/Interventions  ADLs/Self Care Home Management;Cryotherapy;Electrical Stimulation;Moist Heat;Ultrasound;Patient/family education;Therapeutic exercise;Therapeutic activities;Manual techniques;Dry needling    PT Next Visit Plan  Flexion based core exercises.  Electrical stimulation and HMP; Combo e'stim/U/S and STW/M especially to SI ligaments.  Dry needling is an option as well as int traction beginning at 35% BW.    Consulted and Agree with Plan of Care  Patient       Patient will benefit from skilled therapeutic intervention in order to improve the following deficits and impairments:  Pain, Decreased activity tolerance, Decreased range of motion  Visit Diagnosis: Acute left-sided low back pain with left-sided sciatica  Acute bilateral low back pain, with sciatica presence unspecified  Muscle weakness (generalized)  Other symptoms and signs involving the musculoskeletal system     Problem List Patient Active Problem List   Diagnosis Date Noted  . Menorrhagia 05/28/2016  . Nausea with vomiting 07/14/2014  . Dysgeusia 03/11/2014  . GERD (gastroesophageal reflux disease) 03/11/2014  . Hot flashes 03/09/2014  . Perimenopause 03/09/2014  . Fibroids 03/09/2014    Oretha Caprice, MPT 05/14/2017, 9:43 AM  [facility] [place], [place], [address] Phone: [phone]   Fax:  [phone]  Name: Rose Delgado MRN: [id number] Date of Birth: [birth year]

## 2017-05-20 ENCOUNTER — Ambulatory Visit: Payer: PRIVATE HEALTH INSURANCE | Admitting: Physical Therapy

## 2017-05-20 ENCOUNTER — Encounter: Payer: Self-pay | Admitting: Physical Therapy

## 2017-05-20 DIAGNOSIS — M5442 Lumbago with sciatica, left side: Secondary | ICD-10-CM

## 2017-05-20 DIAGNOSIS — M545 Low back pain: Secondary | ICD-10-CM | POA: Diagnosis not present

## 2017-05-20 DIAGNOSIS — M6281 Muscle weakness (generalized): Secondary | ICD-10-CM

## 2017-05-20 DIAGNOSIS — R29898 Other symptoms and signs involving the musculoskeletal system: Secondary | ICD-10-CM

## 2017-05-20 NOTE — Therapy (Signed)
[facility] [place], [place], [address] Phone: [phone]   Fax:  [phone]  Physical Therapy Treatment  Patient Details  Name: Rose Delgado MRN: [id number] Date of Birth: 1961/06/21 Referring Provider: Phylliss Bob MD.   Encounter Date: 05/20/2017    Past Medical History:  Diagnosis Date  . Allergic rhinitis   . Fibroids   . Irregular heart beat   . Migraines   . Perimenopause 03/09/2014  . Reflux     Past Surgical History:  Procedure Laterality Date  . BREAST BIOPSY Left 2008   benign  . COLONOSCOPY  06/2011   Dr. Britta Mccreedy: per patient it was normal, follow up in 10 years.   . ESOPHAGOGASTRODUODENOSCOPY     Dr. Berneta Sages: late 1990s/early 2000  . ESOPHAGOGASTRODUODENOSCOPY (EGD) N/A 05/19/2014   Performed by Danie Binder, MD at [place]  . INGUINAL HERNIA REPAIR    . KNEE SURGERY Right   . TONSILLECTOMY AND ADENOIDECTOMY      There were no vitals filed for this visit.  Subjective Assessment - 05/20/17 1252    Subjective  Patient arriving to therapy reporting 6/10 LBP. Pt reporting having an injection last week and feeling great until yesterday when it began to wear off.     Pertinent History  MRI reveals some lower lumbar disc degeneration.    Limitations  Standing;Sitting    How long can you sit comfortably?  15-20 minutes.    How long can you stand comfortably?  10-15 minutes.    Diagnostic tests  MRI.    Patient Stated Goals  Get back to normal with no back pain.    Currently in Pain?  Yes    Pain Score  6     Pain Location  Back    Pain Orientation  Lower    Pain Descriptors / Indicators  Aching    Pain Onset  More than a month ago    Pain Frequency  Constant    Aggravating Factors   certain movements    Pain Relieving Factors  the injection helped short term         [facility] PT Assessment - 05/20/17 0001      Assessment   Medical Diagnosis  Lumbar facet arthritis.      Precautions   Precautions  None      Restrictions   Weight Bearing Restrictions  No      Balance Screen   Has the patient fallen in the past 6 months  No                  [facility] Adult PT Treatment/Exercise - 05/20/17 0001      Exercises   Exercises  Lumbar      Moist Heat Therapy   Number Minutes Moist Heat  15 Minutes    Moist Heat Location  Lumbar Spine      Traction   Type of Traction  Lumbar    Min (lbs)  5    Max (lbs)  60    Hold Time  99    Rest Time  5    Time  20 minutes      Manual Therapy   Manual Therapy  Soft tissue mobilization    Manual therapy comments  STW to lumbar and thoracic paraspinals             PT Education - 05/20/17 1256    Education provided  Yes    Education  Details  posture    Person(s) Educated  Patient    Methods  Explanation    Comprehension  Verbalized understanding          PT Long Term Goals - 05/20/17 1300      PT LONG TERM GOAL #1   Title  Independent with a HEP.    Baseline  8/13- improved but has not met goal     Time  4    Period  Weeks    Status  On-going      PT LONG TERM GOAL #2   Title  Perform ADL's with pain not > 2/10.    Baseline  8/13- if working can get to 8/10; if at home relaxing it can get to 4-5/10    Period  Weeks    Status  On-going      PT LONG TERM GOAL #3   Title  Eliminate left LE symptoms.    Baseline  8/13- have not discussed     Time  4    Period  Weeks    Status  New      PT LONG TERM GOAL #4   Title  Patient to report she has been able to return to full work duties with no pain exacerbation in order to assist in return to PLOF     Baseline  8/13- ongoing     Time  3    Period  Weeks    Status  On-going            Plan - 05/20/17 1257    Clinical Impression Statement  Patient presenting today reporting 6/10 pain in her low back extending to thoracic spine and reporting spasms. Pt tolerated STW and then traction. Continue with skilled PT.     Rehab  Potential  Good    PT Frequency  2x / week    PT Duration  4 weeks    PT Treatment/Interventions  ADLs/Self Care Home Management;Cryotherapy;Electrical Stimulation;Moist Heat;Ultrasound;Patient/family education;Therapeutic exercise;Therapeutic activities;Manual techniques;Dry needling    PT Next Visit Plan  Flexion based core exercises.  Electrical stimulation and HMP; Combo e'stim/U/S and STW/M especially to SI ligaments.  Dry needling is an option as well as int traction beginning at 35% BW.    Consulted and Agree with Plan of Care  Patient       Patient will benefit from skilled therapeutic intervention in order to improve the following deficits and impairments:  Pain, Decreased activity tolerance, Decreased range of motion  Visit Diagnosis: Acute left-sided low back pain with left-sided sciatica  Acute bilateral low back pain, with sciatica presence unspecified  Muscle weakness (generalized)  Other symptoms and signs involving the musculoskeletal system     Problem List Patient Active Problem List   Diagnosis Date Noted  . Menorrhagia 05/28/2016  . Nausea with vomiting 07/14/2014  . Dysgeusia 03/11/2014  . GERD (gastroesophageal reflux disease) 03/11/2014  . Hot flashes 03/09/2014  . Perimenopause 03/09/2014  . Fibroids 03/09/2014    Oretha Caprice, MPT 05/20/2017, 1:06 PM  [facility] [address] [place], [place], [address] Phone: [phone]   Fax:  [phone]  Name: Rose Delgado MRN: [id number] Date of Birth: 1960/08/13

## 2017-05-29 ENCOUNTER — Encounter: Payer: Self-pay | Admitting: Physical Therapy

## 2017-05-29 ENCOUNTER — Ambulatory Visit: Payer: PRIVATE HEALTH INSURANCE | Admitting: Physical Therapy

## 2017-05-29 DIAGNOSIS — R29898 Other symptoms and signs involving the musculoskeletal system: Secondary | ICD-10-CM

## 2017-05-29 DIAGNOSIS — M545 Low back pain: Secondary | ICD-10-CM | POA: Diagnosis not present

## 2017-05-29 DIAGNOSIS — M6281 Muscle weakness (generalized): Secondary | ICD-10-CM

## 2017-05-29 DIAGNOSIS — M5442 Lumbago with sciatica, left side: Secondary | ICD-10-CM

## 2017-05-29 NOTE — Therapy (Signed)
[facility] [place], [place], [address] Phone: [phone]   Fax:  [phone]  Physical Therapy Treatment  Patient Details  Name: Rose Delgado MRN: [id number] Date of Birth: 1961-03-16 Referring Provider: Phylliss Bob MD.   Encounter Date: 05/29/2017  PT End of Session - 05/29/17 0948    Visit Number  5    Number of Visits  8    Date for PT Re-Evaluation  05/29/17    PT Start Time  0901    PT Stop Time  0958    PT Time Calculation (min)  57 min    Activity Tolerance  Patient tolerated treatment well    Behavior During Therapy  [facility] for tasks assessed/performed       Past Medical History:  Diagnosis Date  . Allergic rhinitis   . Fibroids   . Irregular heart beat   . Migraines   . Perimenopause 03/09/2014  . Reflux     Past Surgical History:  Procedure Laterality Date  . BREAST BIOPSY Left 2008   benign  . COLONOSCOPY  06/2011   Dr. Britta Mccreedy: per patient it was normal, follow up in 10 years.   . ESOPHAGOGASTRODUODENOSCOPY     Dr. Berneta Sages: late 1990s/early 2000  . ESOPHAGOGASTRODUODENOSCOPY N/A 05/19/2014   SLF:NO OBVIOUS SOURCE FOR DYSGEUSIA IDENTIFED/MILD Non-erosive gastritis  . INGUINAL HERNIA REPAIR    . KNEE SURGERY Right   . TONSILLECTOMY AND ADENOIDECTOMY      There were no vitals filed for this visit.  Subjective Assessment - 05/29/17 0903    Subjective  Patient reported ongoing discomfort    Pertinent History  MRI reveals some lower lumbar disc degeneration.    Limitations  Standing;Sitting    How long can you sit comfortably?  15-20 minutes.    How long can you stand comfortably?  10-15 minutes.    Diagnostic tests  MRI.    Patient Stated Goals  Get back to normal with no back pain.    Currently in Pain?  Yes    Pain Score  6     Pain Location  Back    Pain Descriptors / Indicators  Aching    Pain Type  Acute pain    Pain Onset  More than a month ago    Pain Frequency  Constant    Aggravating Factors   certain movements    Pain Relieving Factors  injection                      [facility] Adult PT Treatment/Exercise - 05/29/17 0001      Lumbar Exercises: Stretches   Single Knee to Chest Stretch  3 reps;20 seconds    Double Knee to Chest Stretch  3 reps;20 seconds    Piriformis Stretch  3 reps;30 seconds      Lumbar Exercises: Supine   Ab Set  20 reps;3 seconds    Glut Set  20 reps;3 seconds    Bent Knee Raise  20 reps;3 seconds    Bridge  20 reps;3 seconds    Straight Leg Raise  20 reps;3 seconds      Traction   Type of Traction  Lumbar    Min (lbs)  5    Max (lbs)  65    Hold Time  99    Rest Time  5    Time  15      Manual Therapy   Manual Therapy  Soft tissue mobilization  Manual therapy comments  STW to lumbar and thoracic paraspinals, esp left side due to increased palpable pain and trigger point, to reduce pain and tone             PT Education - 05/29/17 0947    Education provided  Yes    Education Details  HEP core and posture techniques    Person(s) Educated  Patient    Methods  Explanation;Demonstration;Handout    Comprehension  Verbalized understanding;Returned demonstration          PT Long Term Goals - 05/29/17 0950      PT LONG TERM GOAL #1   Title  Independent with a HEP.    Time  4    Period  Weeks    Status  Achieved      PT LONG TERM GOAL #2   Title  Perform ADL's with pain not > 2/10.    Time  4    Period  Weeks    Status  On-going      PT LONG TERM GOAL #3   Title  Eliminate left LE symptoms.    Time  4    Period  Weeks    Status  On-going      PT LONG TERM GOAL #4   Title  Patient to report she has been able to return to full work duties with no pain exacerbation in order to assist in return to PLOF     Time  3    Period  Weeks    Status  On-going            Plan - 05/29/17 0951    Clinical Impression Statement  Patient tolerated treatment well today. Patient reported temporary  relief thus far. Patient has palpable tightness and pain esp in left mid to low back today. Educated patient on posture awareness techniques and core activation and strength progression with HEP sheet for all. Patient understands importance of all activities to protect back and avoid re-injury. Patient reported no increased discomfort durning exercises and less pain post treatment. Patient met LTG #1 today and other goals ongoing due to pain deficts.     Rehab Potential  Good    PT Frequency  2x / week    PT Duration  4 weeks    PT Treatment/Interventions  ADLs/Self Care Home Management;Cryotherapy;Electrical Stimulation;Moist Heat;Ultrasound;Patient/family education;Therapeutic exercise;Therapeutic activities;Manual techniques;Dry needling    PT Next Visit Plan  Flexion based core exercises.  Electrical stimulation and HMP; Combo e'stim/U/S and STW/M especially to SI ligaments.  Dry needling/traction    Consulted and Agree with Plan of Care  Patient       Patient will benefit from skilled therapeutic intervention in order to improve the following deficits and impairments:  Pain, Decreased activity tolerance, Decreased range of motion  Visit Diagnosis: Acute left-sided low back pain with left-sided sciatica  Acute bilateral low back pain, with sciatica presence unspecified  Muscle weakness (generalized)  Other symptoms and signs involving the musculoskeletal system     Problem List Patient Active Problem List   Diagnosis Date Noted  . Menorrhagia 05/28/2016  . Nausea with vomiting 07/14/2014  . Dysgeusia 03/11/2014  . GERD (gastroesophageal reflux disease) 03/11/2014  . Hot flashes 03/09/2014  . Perimenopause 03/09/2014  . Fibroids 03/09/2014    Phillips Climes, PTA 05/29/2017, 10:13 AM  [facility] [place], [place], [address] Phone: [phone]   Fax:  [phone]  Name: Rose Delgado  MRN: [id number] Date  of Birth: 02/28/1961

## 2017-05-29 NOTE — Patient Instructions (Signed)
Pelvic Tilt: Posterior - Legs Bent (Supine)   Tighten stomach and flatten back by rolling pelvis down. Hold _10___ seconds. Relax. Repeat _10-30___ times per set. Do __2__ sets per session. Do _2___ sessions per day.   . Straight Leg Raise   Tighten stomach and slowly raise locked right leg __4__ inches from floor. Repeat __10-30__ times per set. Do __2__ sets per session. Do __2__ sessions per day.    Bent Leg Lift (Hook-Lying)   Tighten stomach and slowly raise right leg _5___ inches from floor. Keep trunk rigid. Hold _3___ seconds. Repeat _10___ times per set. Do ___2-3_ sets per session. Do __2__ sessions per day.  Brushing Teeth    Place one foot on ledge and one hand on counter. Bend other knee slightly to keep back straight.  Copyright  VHI. All rights reserved.  Refrigerator   Squat with knees apart to reach lower shelves and drawers.   Copyright  VHI. All rights reserved.  Laundry Basket   Squat down and hold basket close to stand. Use leg muscles to do the work.   Copyright  VHI. All rights reserved.  Housework - Vacuuming   Hold the vacuum with arm held at side. Step back and forth to move it, keeping head up. Avoid twisting.   Copyright  VHI. All rights reserved.  Housework - Wiping   Position yourself as close as possible to reach work surface. Avoid straining your back.   Copyright  VHI. All rights reserved.  Gardening - Mowing   Keep arms close to sides and walk with lawn mower.   Copyright  VHI. All rights reserved.  Sleeping on Side   Place pillow between knees. Use cervical support under neck and a roll around waist as needed.   Copyright  VHI. All rights reserved.  Log Roll   Lying on back, bend left knee and place left arm across chest. Roll all in one movement to the right. Reverse to roll to the left. Always move as one unit.   Copyright  VHI. All rights reserved.  Stand to Sit / Sit to Stand   To sit: Bend  knees to lower self onto front edge of chair, then scoot back on seat. To stand: Reverse sequence by placing one foot forward, and scoot to front of seat. Use rocking motion to stand up.  Copyright  VHI. All rights reserved.  Posture - Standing   Good posture is important. Avoid slouching and forward head thrust. Maintain curve in low back and align ears over shoul- ders, hips over ankles.   Copyright  VHI. All rights reserved.  Posture - Sitting   Sit upright, head facing forward. Try using a roll to support lower back. Keep shoulders relaxed, and avoid rounded back. Keep hips level with knees. Avoid crossing legs for long periods.   Copyright  VHI. All rights reserved.  Computer Work   Position work to face forward. Use proper work and seat height. Keep shoulders back and down, wrists straight, and elbows at right angles. Use chair that provides full back support. Add footrest and lumbar roll as needed.   Copyright  VHI. All rights reserved.     

## 2017-05-31 ENCOUNTER — Encounter: Payer: Self-pay | Admitting: Physical Therapy

## 2017-05-31 DIAGNOSIS — Z6825 Body mass index (BMI) 25.0-25.9, adult: Secondary | ICD-10-CM | POA: Diagnosis not present

## 2017-05-31 DIAGNOSIS — L03113 Cellulitis of right upper limb: Secondary | ICD-10-CM | POA: Diagnosis not present

## 2017-05-31 MED FILL — POTASSIUM CL ER 20 MEQ TABL: 20 | 90 days supply | Qty: 90 | Fill #3

## 2017-05-31 MED FILL — SM COMPLETE 50+ TABLET: 125 days supply | Qty: 125 | Fill #0

## 2017-06-03 ENCOUNTER — Encounter: Payer: Self-pay | Admitting: Physical Therapy

## 2017-06-03 ENCOUNTER — Ambulatory Visit: Payer: PRIVATE HEALTH INSURANCE | Attending: Orthopedic Surgery | Admitting: Physical Therapy

## 2017-06-03 DIAGNOSIS — M545 Low back pain: Secondary | ICD-10-CM | POA: Insufficient documentation

## 2017-06-03 DIAGNOSIS — M5442 Lumbago with sciatica, left side: Secondary | ICD-10-CM | POA: Insufficient documentation

## 2017-06-03 NOTE — Therapy (Signed)
[facility] [place], [place], [address] Phone: [phone]   Fax:  [phone]  Physical Therapy Treatment  Patient Details  Name: Rose Delgado MRN: [id number] Date of Birth: 1961/02/19 Referring Provider: Phylliss Bob MD.   Encounter Date: 06/03/2017  PT End of Session - 06/03/17 0951    Visit Number  6    Number of Visits  8    Date for PT Re-Evaluation  05/29/17    PT Start Time  0951    PT Stop Time  1041    PT Time Calculation (min)  50 min    Activity Tolerance  Patient tolerated treatment well    Behavior During Therapy  [facility] for tasks assessed/performed       Past Medical History:  Diagnosis Date  . Allergic rhinitis   . Fibroids   . Irregular heart beat   . Migraines   . Perimenopause 03/09/2014  . Reflux     Past Surgical History:  Procedure Laterality Date  . BREAST BIOPSY Left 2008   benign  . COLONOSCOPY  06/2011   Dr. Britta Mccreedy: per patient it was normal, follow up in 10 years.   . ESOPHAGOGASTRODUODENOSCOPY     Dr. Berneta Sages: late 1990s/early 2000  . ESOPHAGOGASTRODUODENOSCOPY N/A 05/19/2014   SLF:NO OBVIOUS SOURCE FOR DYSGEUSIA IDENTIFED/MILD Non-erosive gastritis  . INGUINAL HERNIA REPAIR    . KNEE SURGERY Right   . TONSILLECTOMY AND ADENOIDECTOMY      There were no vitals filed for this visit.  Subjective Assessment - 06/03/17 0951    Subjective  Patient reported ongoing discomfort. She hasn't had any relief from the traction. Patient to get injection tomorrow, but would like to try DN today.    Pertinent History  MRI reveals some lower lumbar disc degeneration.    Limitations  Standing;Sitting    How long can you sit comfortably?  15-20 minutes.    How long can you stand comfortably?  10-15 minutes.    Diagnostic tests  MRI.    Patient Stated Goals  Get back to normal with no back pain.    Currently in Pain?  Yes    Pain Score  6     Pain Location  Back    Pain Orientation  Lower     Pain Descriptors / Indicators  Aching    Pain Type  Acute pain    Pain Onset  More than a month ago    Pain Frequency  Constant    Aggravating Factors   certain movments                      [facility] Adult PT Treatment/Exercise - 06/03/17 0001      Lumbar Exercises: Stretches   Quadruped Mid Back Stretch  2 reps;30 seconds    Piriformis Stretch  1 rep;30 seconds      Lumbar Exercises: Standing   Other Standing Lumbar Exercises  MFR using foam roller to paraspinals      Modalities   Modalities  Electrical Stimulation;Moist Heat      Moist Heat Therapy   Number Minutes Moist Heat  15 Minutes    Moist Heat Location  Lumbar Spine      Electrical Stimulation   Electrical Stimulation Location  IFC to lumbar gluteals 80-150 Hz x 15 min    Electrical Stimulation Goals  Pain      Manual Therapy   Manual Therapy  Soft tissue mobilization;Myofascial release  Soft tissue mobilization  to L gluteals and Bil lumbar paraspinals    Myofascial Release  to R lumbar paraspinals       Trigger Point Dry Needling - 06/03/17 1046    Consent Given?  Yes    Education Handout Provided  Yes    Muscles Treated Lower Body  Gluteus minimus;Piriformis L    Gluteus Minimus Response  Twitch response elicited;Palpable increased muscle length also L medius    Piriformis Response  Twitch response elicited;Palpable increased muscle length L           PT Education - 06/03/17 1031    Education provided  Yes    Education Details  HEP_; DN education and aftercare    Methods  Explanation;Demonstration;Handout    Comprehension  Verbalized understanding;Returned demonstration          PT Long Term Goals - 05/29/17 0950      PT LONG TERM GOAL #1   Title  Independent with a HEP.    Time  4    Period  Weeks    Status  Achieved      PT LONG TERM GOAL #2   Title  Perform ADL's with pain not > 2/10.    Time  4    Period  Weeks    Status  On-going      PT LONG TERM GOAL #3    Title  Eliminate left LE symptoms.    Time  4    Period  Weeks    Status  On-going      PT LONG TERM GOAL #4   Title  Patient to report she has been able to return to full work duties with no pain exacerbation in order to assist in return to PLOF     Time  3    Period  Weeks    Status  On-going            Plan - 06/03/17 1228    Clinical Impression Statement  Patient did very well with treatment today. She was educated on and gave consent for DN and responded well. She continues to have increased tone B lumbar paraspinals R > L. Patient is having injection tomorrow 06/04/17 and won't return until next week. LTGs are ongoing.    Rehab Potential  Good    PT Frequency  2x / week    PT Treatment/Interventions  ADLs/Self Care Home Management;Cryotherapy;Electrical Stimulation;Moist Heat;Ultrasound;Patient/family education;Therapeutic exercise;Therapeutic activities;Manual techniques;Dry needling    PT Next Visit Plan  Flexion based core exercises.  Electrical stimulation and HMP; Combo e'stim/U/S and STW/M especially to SI ligaments.  Dry needling/traction    PT Home Exercise Plan  figure 4 piriformis, child's pose with variations    Consulted and Agree with Plan of Care  Patient       Patient will benefit from skilled therapeutic intervention in order to improve the following deficits and impairments:  Pain, Decreased activity tolerance, Decreased range of motion  Visit Diagnosis: Acute left-sided low back pain with left-sided sciatica     Problem List Patient Active Problem List   Diagnosis Date Noted  . Menorrhagia 05/28/2016  . Nausea with vomiting 07/14/2014  . Dysgeusia 03/11/2014  . GERD (gastroesophageal reflux disease) 03/11/2014  . Hot flashes 03/09/2014  . Perimenopause 03/09/2014  . Fibroids 03/09/2014    Madelyn Flavors PT 06/03/2017, 12:32 PM  [facility] [address] [place], [place], [address] Phone: [phone]    Fax:  [phone]  Name: Rose Delgado MRN: [id number] Date of Birth: Sep 30, 1960

## 2017-06-03 NOTE — Patient Instructions (Addendum)
[facility]  DRY NEEDLING CONSENT FORM   Trigger point dry needling is a physical therapy approach to treat Myofascial Pain and Dysfunction.  Dry Needling (DN) is a valuable and effective way to deactivate myofascial trigger points (muscle knots). It is skilled intervention that uses a thin filiform needle to penetrate the skin and stimulate underlying myofascial trigger points, muscular, and connective tissues for the management of neuromusculoskeletal pain and movement impairments.  A local twitch response (LTR) will be elicited.  This can sometimes feel like a deep ache in the muscle during the procedure. Multiple trigger points in multiple muscles can be treated during each treatment.  No medication of any kind is injected.   As with any medical treatment and procedure, there are possible adverse events.  While significant adverse events are uncommon, they do sometimes occur and must be considered prior to giving consent.  1. Dry needling often causes a "post needling soreness".  There can be an increase in pain from a couple of hours to 2-3 days, followed by an improvement in the overall pain state. 2. Any time a needle is used there is a risk of infection.  However, we are using new, sterile, and disposable needles; infections are extremely rare. 3. There is a possibility that you may bleed or bruise.  You may feel tired and some nausea following treatment. 4. There is a rare possibility of a pneumothorax (air in the chest cavity). 5. Allergic reaction to nickel in the stainless steel needle. 6. If a nerve is touched, it may cause paresthesia (a prickling/shock sensation) which is usually brief, but may continue for a couple of days.  Following treatment stay hydrated.  Continue regular activities but not too vigorous initially after treatment for 24-48 hours.  Dry Needling is best when combined with other physical therapy interventions such as strengthening,  stretching and other therapeutic modalities.     PLEASE ANSWER THE FOLLOWING QUESTIONS:  Do you have a lack of sensation?   Y/N  Do you have a phobia or fear of needles  Y/N  Are you pregnant?    Y/N If yes:  How many weeks? _____  Do you have any implanted devices?  Y/N If yes:  Pacemaker/Spinal Cord         Stimulator/Deep Brain         Stimulator/Insulin          Pump/Other: ____________ Do you have any implants?   Y/N If yes:      Do you take any blood thinners?   Y/N If yes: Coumadin          (Warfarin)/Other:  Do you have a bleeding disorder?   Y/N If yes: What kind:   Do you take any immunosuppressants?  Y/N If yes:   What kind:   Do you take anti-inflammatories?   Y/N If yes: What kind:  Have you ever been diagnosed with Scoliosis? Y/N  Have you had back surgery?    Y/N If yes:         Laminectomy/Fusion/Other:   I have read, or had read to me, the above.  I have had the opportunity to ask any questions.  All of my questions have been answered to my satisfaction and I understand the risks involved with dry needling.  I consent to examination and treatment at [facility], including dry needling, of any and all of my involved and affected muscles.   Piriformis (Supine)  Cross  legs, right on top. Gently pull other knee toward chest until stretch is felt in buttock/hip of top leg. Hold __30-60__ seconds. Repeat ___3_ times per set. Do ____ sets per session. Do ___2_ sessions per day.  Hip Stretch  Put right ankle over left knee. Let right knee fall downward, but keep ankle in place. Feel the stretch in hip. May push down gently with hand to feel stretch. Hold __30-60__ seconds while counting out loud. Repeat with other leg. Repeat _3___ times. Do _2___ sessions per day.  Side Waist Stretch from Child's Pose    From child's pose, walk hands to left. Reach right hand out on diagonal. Reach hips back toward heels making a C with torso.  Breathe into right side waist. Hold for __15__ breaths. Repeat _2___ times each side.  Copyright  VHI. All rights reserved.   Madelyn Flavors, PT 06/03/17 10:31 AM [facility] [address] [place], [place], [address] Phone: [phone]   Fax:  [phone]

## 2017-06-06 MED FILL — traMADol HCL 50 MG TABS: 50 | 5 days supply | Qty: 10 | Fill #0

## 2017-06-06 MED FILL — METHOCARBAMOL 500 MG TABS: 500 | 30 days supply | Qty: 60 | Fill #0

## 2017-06-12 ENCOUNTER — Encounter: Payer: Self-pay | Admitting: Physical Therapy

## 2017-06-14 MED FILL — dilTIAZem HCL 30 MG TABS: 30 | 90 days supply | Qty: 270 | Fill #2

## 2017-06-18 DIAGNOSIS — D485 Neoplasm of uncertain behavior of skin: Secondary | ICD-10-CM | POA: Diagnosis not present

## 2017-06-26 MED FILL — PANTOPRAZOLE SOD DR 40 MG T: 40 | 90 days supply | Qty: 90 | Fill #3

## 2017-06-27 DIAGNOSIS — M79644 Pain in right finger(s): Secondary | ICD-10-CM | POA: Diagnosis not present

## 2017-06-28 ENCOUNTER — Ambulatory Visit: Payer: PRIVATE HEALTH INSURANCE | Admitting: Physical Therapy

## 2017-06-28 ENCOUNTER — Encounter: Payer: Self-pay | Admitting: Physical Therapy

## 2017-06-28 DIAGNOSIS — M5442 Lumbago with sciatica, left side: Secondary | ICD-10-CM

## 2017-06-28 DIAGNOSIS — M545 Low back pain: Secondary | ICD-10-CM

## 2017-06-28 NOTE — Therapy (Signed)
[facility] [place], [place], [address] Phone: [phone]   Fax:  [phone]  Physical Therapy Treatment  Patient Details  Name: Rose Delgado MRN: [id number] Date of Birth: 01/10/1961 Referring Provider: Phylliss Bob MD.   Encounter Date: 06/28/2017  PT End of Session - 06/28/17 1425    Visit Number  7    Number of Visits  8    Date for PT Re-Evaluation  05/29/17    PT Start Time  0946    PT Stop Time  1040    PT Time Calculation (min)  54 min    Activity Tolerance  Patient tolerated treatment well    Behavior During Therapy  [facility] for tasks assessed/performed       Past Medical History:  Diagnosis Date  . Allergic rhinitis   . Fibroids   . Irregular heart beat   . Migraines   . Perimenopause 03/09/2014  . Reflux     Past Surgical History:  Procedure Laterality Date  . BREAST BIOPSY Left 2008   benign  . COLONOSCOPY  06/2011   Dr. Britta Mccreedy: per patient it was normal, follow up in 10 years.   . ESOPHAGOGASTRODUODENOSCOPY     Dr. Berneta Sages: late 1990s/early 2000  . ESOPHAGOGASTRODUODENOSCOPY N/A 05/19/2014   SLF:NO OBVIOUS SOURCE FOR DYSGEUSIA IDENTIFED/MILD Non-erosive gastritis  . INGUINAL HERNIA REPAIR    . KNEE SURGERY Right   . TONSILLECTOMY AND ADENOIDECTOMY      There were no vitals filed for this visit.  Subjective Assessment - 06/28/17 1426    Subjective  I'm really not much better.  The left side hurts most today.  No improvement from dry needling.    Pain Score  6     Pain Location  Back    Pain Orientation  Lower    Pain Descriptors / Indicators  Aching    Pain Onset  More than a month ago                      [facility] Adult PT Treatment/Exercise - 06/28/17 0001      Modalities   Modalities  Electrical Stimulation;Moist Heat;Ultrasound      Moist Heat Therapy   Number Minutes Moist Heat  20 Minutes    Moist Heat Location  Lumbar Spine      Electrical Stimulation    Electrical Stimulation Location  Lumbar    Electrical Stimulation Action  IFC    Electrical Stimulation Parameters  80-150 Hz x 20 minutes.    Electrical Stimulation Goals  Tone;Pain      Ultrasound   Ultrasound Location  Left low back.    Ultrasound Parameters  1.50 W/CM2 x 12 minutes.    Ultrasound Goals  Pain      Manual Therapy   Manual Therapy  Soft tissue mobilization    Soft tissue mobilization  Patient in right sdly position:  performed STW/M to left low back musculature and left QL which was very taut to palpation (14 minutes).                  PT Long Term Goals - 05/29/17 0950      PT LONG TERM GOAL #1   Title  Independent with a HEP.    Time  4    Period  Weeks    Status  Achieved      PT LONG TERM GOAL #2   Title  Perform ADL's with pain not >  2/10.    Time  4    Period  Weeks    Status  On-going      PT LONG TERM GOAL #3   Title  Eliminate left LE symptoms.    Time  4    Period  Weeks    Status  On-going      PT LONG TERM GOAL #4   Title  Patient to report she has been able to return to full work duties with no pain exacerbation in order to assist in return to PLOF     Time  3    Period  Weeks    Status  On-going            Plan - 06/28/17 1432    Clinical Impression Statement  Patient not reporting significant overall improvement.  She has one visit left.  Will discharge with HEP.  Her left QL was very taut to palpation today.    PT Treatment/Interventions  ADLs/Self Care Home Management;Cryotherapy;Electrical Stimulation;Moist Heat;Ultrasound;Patient/family education;Therapeutic exercise;Therapeutic activities;Manual techniques;Dry needling    PT Next Visit Plan  Flexion based core exercises.  Electrical stimulation and HMP; Combo e'stim/U/S and STW/M especially to SI ligaments.  Dry needling/traction    PT Home Exercise Plan  figure 4 piriformis, child's pose with variations    Consulted and Agree with Plan of Care  Patient        Patient will benefit from skilled therapeutic intervention in order to improve the following deficits and impairments:  Pain, Decreased activity tolerance, Decreased range of motion  Visit Diagnosis: Acute left-sided low back pain with left-sided sciatica  Acute bilateral low back pain, with sciatica presence unspecified     Problem List Patient Active Problem List   Diagnosis Date Noted  . Menorrhagia 05/28/2016  . Nausea with vomiting 07/14/2014  . Dysgeusia 03/11/2014  . GERD (gastroesophageal reflux disease) 03/11/2014  . Hot flashes 03/09/2014  . Perimenopause 03/09/2014  . Fibroids 03/09/2014    Dean Wonder, [place] MPT 06/28/2017, 2:34 PM  [facility] [address] [place], [place], [address] Phone: [phone]   Fax:  [phone]  Name: Rose Delgado MRN: [id number] Date of Birth: [birth year]

## 2017-07-01 ENCOUNTER — Ambulatory Visit (HOSPITAL_COMMUNITY)
Admission: RE | Admit: 2017-07-01 | Discharge: 2017-07-01 | Disposition: A | Discharge status: Home / Self Care | Payer: 59 | Source: Ambulatory Visit | Attending: Orthopedic Surgery | Admitting: Orthopedic Surgery

## 2017-07-01 ENCOUNTER — Other Ambulatory Visit (HOSPITAL_COMMUNITY): Payer: Self-pay | Admitting: Orthopedic Surgery

## 2017-07-01 DIAGNOSIS — M79644 Pain in right finger(s): Secondary | ICD-10-CM | POA: Diagnosis not present

## 2017-07-01 DIAGNOSIS — M19041 Primary osteoarthritis, right hand: Secondary | ICD-10-CM | POA: Diagnosis not present

## 2017-07-03 ENCOUNTER — Ambulatory Visit: Payer: PRIVATE HEALTH INSURANCE | Attending: Orthopedic Surgery | Admitting: Physical Therapy

## 2017-07-03 ENCOUNTER — Encounter: Payer: Self-pay | Admitting: Physical Therapy

## 2017-07-03 DIAGNOSIS — M6281 Muscle weakness (generalized): Secondary | ICD-10-CM | POA: Diagnosis present

## 2017-07-03 DIAGNOSIS — M545 Low back pain: Secondary | ICD-10-CM | POA: Diagnosis present

## 2017-07-03 DIAGNOSIS — R29898 Other symptoms and signs involving the musculoskeletal system: Secondary | ICD-10-CM | POA: Diagnosis present

## 2017-07-03 DIAGNOSIS — M5442 Lumbago with sciatica, left side: Secondary | ICD-10-CM | POA: Diagnosis not present

## 2017-07-03 NOTE — Therapy (Signed)
[facility] [place], [place], [address] Phone: [phone]   Fax:  [phone]  Physical Therapy Treatment  Patient Details  Name: Rose Delgado MRN: [id number] Date of Birth: Mar 15, 1961 Referring Provider: Phylliss Bob MD.   Encounter Date: 07/03/2017  PT End of Session - 07/03/17 0806    Visit Number  8    Number of Visits  16    Date for PT Re-Evaluation  07/12/17    PT Start Time  0734    PT Stop Time  0817    PT Time Calculation (min)  43 min    Activity Tolerance  Patient tolerated treatment well    Behavior During Therapy  [facility] for tasks assessed/performed       Past Medical History:  Diagnosis Date  . Allergic rhinitis   . Fibroids   . Irregular heart beat   . Migraines   . Perimenopause 03/09/2014  . Reflux     Past Surgical History:  Procedure Laterality Date  . BREAST BIOPSY Left 2008   benign  . COLONOSCOPY  06/2011   Dr. Britta Mccreedy: per patient it was normal, follow up in 10 years.   . ESOPHAGOGASTRODUODENOSCOPY     Dr. Berneta Sages: late 1990s/early 2000  . ESOPHAGOGASTRODUODENOSCOPY N/A 05/19/2014   SLF:NO OBVIOUS SOURCE FOR DYSGEUSIA IDENTIFED/MILD Non-erosive gastritis  . INGUINAL HERNIA REPAIR    . KNEE SURGERY Right   . TONSILLECTOMY AND ADENOIDECTOMY      There were no vitals filed for this visit.  Subjective Assessment - 07/03/17 0750    Subjective  Patient reported temporary relief overall    Pertinent History  MRI reveals some lower lumbar disc degeneration.    Limitations  Standing;Sitting    How long can you sit comfortably?  15-20 minutes.    How long can you stand comfortably?  10-15 minutes.    Diagnostic tests  MRI.    Patient Stated Goals  Get back to normal with no back pain.    Currently in Pain?  Yes    Pain Score  7     Pain Location  Back    Pain Orientation  Mid;Lower    Pain Descriptors / Indicators  Aching;Discomfort;Tightness    Pain Type  Acute pain    Pain Onset   More than a month ago    Pain Frequency  Constant    Aggravating Factors   any prolong activity and certain movements    Pain Relieving Factors  injections and rest                      [facility] Adult PT Treatment/Exercise - 07/03/17 0001      Moist Heat Therapy   Number Minutes Moist Heat  15 Minutes    Moist Heat Location  Lumbar Spine      Electrical Stimulation   Electrical Stimulation Location  Lumbar    Electrical Stimulation Action  IFC    Electrical Stimulation Parameters  80-150hz  x22min    Electrical Stimulation Goals  Tone;Pain      Ultrasound   Ultrasound Location  low mid back paraspinals    Ultrasound Parameters  1.5w/cm2/50%/35mhz x68min    Ultrasound Goals  Pain      Manual Therapy   Manual Therapy  Soft tissue mobilization;Myofascial release    Manual therapy comments  STW/myofasial release to lumbar and thoracic paraspinals, esp left side due to increased palpable pain and trigger point, to reduce pain and  tone                  PT Long Term Goals - 05/29/17 0950      PT LONG TERM GOAL #1   Title  Independent with a HEP.    Time  4    Period  Weeks    Status  Achieved      PT LONG TERM GOAL #2   Title  Perform ADL's with pain not > 2/10.    Time  4    Period  Weeks    Status  On-going      PT LONG TERM GOAL #3   Title  Eliminate left LE symptoms.    Time  4    Period  Weeks    Status  On-going      PT LONG TERM GOAL #4   Title  Patient to report she has been able to return to full work duties with no pain exacerbation in order to assist in return to PLOF     Time  3    Period  Weeks    Status  On-going            Plan - 07/03/17 4401    Clinical Impression Statement  Patient tolerated treatment well today. Patient has reported no long lasting relief after therapy. Patient has relief for a day or so after then discomfort returns per reported. Patient has reported no relief from dry needling or traction. Patient has  some increased tighness in left mid back more than left today. Patient has reported pain up to 7/10 after work and ADL's. Goals ongong at this time due to pain deficts.     Rehab Potential  Good    PT Frequency  2x / week    PT Duration  4 weeks    PT Treatment/Interventions  ADLs/Self Care Home Management;Cryotherapy;Electrical Stimulation;Moist Heat;Ultrasound;Patient/family education;Therapeutic exercise;Therapeutic activities;Manual techniques;Dry needling    PT Next Visit Plan  cont with Flexion based core exercises.  Electrical stimulation and HMP; Combo e'stim/U/S and STW/M     Consulted and Agree with Plan of Care  Patient       Patient will benefit from skilled therapeutic intervention in order to improve the following deficits and impairments:  Pain, Decreased activity tolerance, Decreased range of motion  Visit Diagnosis: Acute left-sided low back pain with left-sided sciatica  Acute bilateral low back pain, with sciatica presence unspecified  Muscle weakness (generalized)  Other symptoms and signs involving the musculoskeletal system     Problem List Patient Active Problem List   Diagnosis Date Noted  . Menorrhagia 05/28/2016  . Nausea with vomiting 07/14/2014  . Dysgeusia 03/11/2014  . GERD (gastroesophageal reflux disease) 03/11/2014  . Hot flashes 03/09/2014  . Perimenopause 03/09/2014  . Fibroids 03/09/2014    Deann Mclaine P, PTA 07/03/2017, 8:22 AM  [facility] [place], [place], [address] Phone: [phone]   Fax:  [phone]  Name: KRYSTYNA CLECKLEY MRN: [id number] Date of Birth: [birth year]

## 2017-07-10 ENCOUNTER — Other Ambulatory Visit (INDEPENDENT_AMBULATORY_CARE_PROVIDER_SITE_OTHER): Payer: Self-pay | Admitting: Otolaryngology

## 2017-07-10 DIAGNOSIS — J343 Hypertrophy of nasal turbinates: Secondary | ICD-10-CM | POA: Diagnosis not present

## 2017-07-10 DIAGNOSIS — J320 Chronic maxillary sinusitis: Secondary | ICD-10-CM

## 2017-07-10 DIAGNOSIS — J310 Chronic rhinitis: Secondary | ICD-10-CM | POA: Diagnosis not present

## 2017-07-10 DIAGNOSIS — J342 Deviated nasal septum: Secondary | ICD-10-CM | POA: Diagnosis not present

## 2017-07-11 ENCOUNTER — Encounter: Payer: Self-pay | Admitting: Physical Therapy

## 2017-07-12 ENCOUNTER — Ambulatory Visit (HOSPITAL_COMMUNITY)
Admission: RE | Admit: 2017-07-12 | Discharge: 2017-07-12 | Disposition: A | Discharge status: Home / Self Care | Payer: 59 | Source: Ambulatory Visit | Attending: Otolaryngology | Admitting: Otolaryngology

## 2017-07-12 DIAGNOSIS — J342 Deviated nasal septum: Secondary | ICD-10-CM | POA: Diagnosis not present

## 2017-07-12 DIAGNOSIS — J329 Chronic sinusitis, unspecified: Secondary | ICD-10-CM | POA: Insufficient documentation

## 2017-07-12 DIAGNOSIS — J320 Chronic maxillary sinusitis: Secondary | ICD-10-CM

## 2017-07-15 DIAGNOSIS — M79644 Pain in right finger(s): Secondary | ICD-10-CM | POA: Diagnosis not present

## 2017-07-16 ENCOUNTER — Ambulatory Visit: Payer: PRIVATE HEALTH INSURANCE | Admitting: *Deleted

## 2017-07-16 ENCOUNTER — Ambulatory Visit: Payer: 59 | Admitting: *Deleted

## 2017-07-16 DIAGNOSIS — M6281 Muscle weakness (generalized): Secondary | ICD-10-CM

## 2017-07-16 DIAGNOSIS — M5442 Lumbago with sciatica, left side: Secondary | ICD-10-CM

## 2017-07-16 DIAGNOSIS — R29898 Other symptoms and signs involving the musculoskeletal system: Secondary | ICD-10-CM

## 2017-07-16 DIAGNOSIS — M545 Low back pain: Secondary | ICD-10-CM

## 2017-07-16 NOTE — Therapy (Signed)
[facility] [place], [place], [address] Phone: [phone]   Fax:  [phone]  Physical Therapy Treatment  Patient Details  Name: Rose Delgado MRN: [id number] Date of Birth: [birth year] Referring Provider: Phylliss Bob MD.   Encounter Date: 07/16/2017  PT End of Session - 07/16/17 1012    Visit Number  9    Number of Visits  16    Date for PT Re-Evaluation  07/12/17    PT Start Time  0902    PT Stop Time  0952    PT Time Calculation (min)  50 min       Past Medical History:  Diagnosis Date  . Allergic rhinitis   . Fibroids   . Irregular heart beat   . Migraines   . Perimenopause 03/09/2014  . Reflux     Past Surgical History:  Procedure Laterality Date  . BREAST BIOPSY Left 2008   benign  . COLONOSCOPY  06/2011   Dr. Britta Mccreedy: per patient it was normal, follow up in 10 years.   . ESOPHAGOGASTRODUODENOSCOPY     Dr. Berneta Sages: late 1990s/early 2000  . ESOPHAGOGASTRODUODENOSCOPY N/A 05/19/2014   SLF:NO OBVIOUS SOURCE FOR DYSGEUSIA IDENTIFED/MILD Non-erosive gastritis  . INGUINAL HERNIA REPAIR    . KNEE SURGERY Right   . TONSILLECTOMY AND ADENOIDECTOMY      There were no vitals filed for this visit.  Subjective Assessment - 07/16/17 0904    Subjective  Went to MD and they.are going to do a permanent block. RT side shooting pain today.    Pertinent History  MRI reveals some lower lumbar disc degeneration.    Limitations  Standing;Sitting    How long can you sit comfortably?  15-20 minutes.    How long can you stand comfortably?  10-15 minutes.    Diagnostic tests  MRI.    Patient Stated Goals  Get back to normal with no back pain.    Currently in Pain?  Yes    Pain Score  8     Pain Location  Back    Pain Orientation  Mid;Lower    Pain Descriptors / Indicators  Aching;Spasm;Shooting    Pain Type  Acute pain    Pain Onset  More than a month ago    Pain Frequency  Constant                       [facility] Adult PT Treatment/Exercise - 07/16/17 0001      Modalities   Modalities  Electrical Stimulation;Moist Heat;Ultrasound      Moist Heat Therapy   Number Minutes Moist Heat  15 Minutes    Moist Heat Location  Lumbar Spine      Electrical Stimulation   Electrical Stimulation Location  Lumbar  IFC x 20 mins 80-150hz     Electrical Stimulation Goals  Tone;Pain      Ultrasound   Ultrasound Location  RT side thoracolumbar paras pt LT sidelying    Ultrasound Parameters  1.5 w/cm2 x12 mins    Ultrasound Goals  Pain      Manual Therapy   Manual Therapy  Soft tissue mobilization;Myofascial release    Manual therapy comments  STW/myofasial release to lumbar and thoracic paraspinals, esp RT side due to increased palpable pain and trigger point, to reduce pain and tone                  PT Long Term Goals - 05/29/17 1601  PT LONG TERM GOAL #1   Title  Independent with a HEP.    Time  4    Period  Weeks    Status  Achieved      PT LONG TERM GOAL #2   Title  Perform ADL's with pain not > 2/10.    Time  4    Period  Weeks    Status  On-going      PT LONG TERM GOAL #3   Title  Eliminate left LE symptoms.    Time  4    Period  Weeks    Status  On-going      PT LONG TERM GOAL #4   Title  Patient to report she has been able to return to full work duties with no pain exacerbation in order to assist in return to PLOF     Time  3    Period  Weeks    Status  On-going            Plan - 07/16/17 1013    Clinical Impression Statement  Pt arrived today reporting after F/U with MD that she will be having a permanent nerve block at some point. Pt reports havimg sharp shooting pains mid-back RT side last night and still today. Rx focused on decreasing pain and MM spasms RT side. Pt did well with [place] and STW today with a decrease in MMtightness and pain end of Rx.      Clinical Decision Making  Low    Rehab Potential  Good    PT  Frequency  2x / week    PT Duration  4 weeks    PT Treatment/Interventions  ADLs/Self Care Home Management;Cryotherapy;Electrical Stimulation;Moist Heat;Ultrasound;Patient/family education;Therapeutic exercise;Therapeutic activities;Manual techniques;Dry needling    PT Next Visit Plan  cont with Flexion based core exercises.  Electrical stimulation and HMP; Combo e'stim/U/S and STW/M     PT Home Exercise Plan  figure 4 piriformis, child's pose with variations    Consulted and Agree with Plan of Care  Patient       Patient will benefit from skilled therapeutic intervention in order to improve the following deficits and impairments:  Pain, Decreased activity tolerance, Decreased range of motion  Visit Diagnosis: Acute left-sided low back pain with left-sided sciatica  Acute bilateral low back pain, with sciatica presence unspecified  Muscle weakness (generalized)  Other symptoms and signs involving the musculoskeletal system     Problem List Patient Active Problem List   Diagnosis Date Noted  . Menorrhagia 05/28/2016  . Nausea with vomiting 07/14/2014  . Dysgeusia 03/11/2014  . GERD (gastroesophageal reflux disease) 03/11/2014  . Hot flashes 03/09/2014  . Perimenopause 03/09/2014  . Fibroids 03/09/2014    Rose Delgado,Rose Delgado, Rose Delgado 07/16/2017, 10:28 AM  [facility] [place], [place], [address] Phone: [phone]   Fax:  [phone]  Name: Rose Delgado MRN: [id number] Date of Birth: 10/21/1960

## 2017-07-17 ENCOUNTER — Other Ambulatory Visit: Payer: Self-pay | Admitting: Orthopedic Surgery

## 2017-07-17 ENCOUNTER — Ambulatory Visit (HOSPITAL_COMMUNITY): Payer: 59

## 2017-07-18 ENCOUNTER — Encounter: Payer: Self-pay | Admitting: Cardiovascular Disease

## 2017-07-18 ENCOUNTER — Ambulatory Visit (INDEPENDENT_AMBULATORY_CARE_PROVIDER_SITE_OTHER): Payer: 59 | Admitting: Cardiovascular Disease

## 2017-07-18 VITALS — BP 124/76 | HR 89 | Ht 63.0 in | Wt 155.0 lb

## 2017-07-18 DIAGNOSIS — I10 Essential (primary) hypertension: Secondary | ICD-10-CM

## 2017-07-18 DIAGNOSIS — R002 Palpitations: Secondary | ICD-10-CM | POA: Diagnosis not present

## 2017-07-18 DIAGNOSIS — I493 Ventricular premature depolarization: Secondary | ICD-10-CM

## 2017-07-18 DIAGNOSIS — Z01818 Encounter for other preprocedural examination: Secondary | ICD-10-CM

## 2017-07-18 NOTE — Patient Instructions (Addendum)
Medication Instructions:  Your physician recommends that you continue on your current medications as directed. Please refer to the Current Medication list given to you today.   Labwork: NONE   Testing/Procedures: NONE   Follow-Up: Your physician wants you to follow-up in: 1 Year.  You will receive a reminder letter in the mail two months in advance. If you don't receive a letter, please call our office to schedule the follow-up appointment.   Any Other Special Instructions Will Be Listed Below (If Applicable).   Todays office visit faxed to Dr.Matthew [facility] at [phone]- C.Wilber Oliphant RN  If you need a refill on your cardiac medications before your next appointment, please call your pharmacy. Thank you for choosing [facility]!

## 2017-07-18 NOTE — Progress Notes (Signed)
SUBJECTIVE: The patient presents for follow-up of palpitations and hypertension.  The patient denies any symptoms of chest pain, palpitations, shortness of breath, lightheadedness, dizziness, leg swelling, orthopnea, PND, and syncope.  She has been doing very well and tolerates diltiazem and takes it 3 times a day.  She has had some right thumb pain and is scheduled for a biopsy on 07/31/17 with an orthopedic hand surgeon in [place].  They plan to use local anesthesia.    Review of Systems: As per "subjective", otherwise negative.  Allergies  Allergen Reactions  . Flagyl [Metronidazole] Nausea And Vomiting  . Lactose Intolerance (Gi)   . Shellfish Allergy   . Topamax [Topiramate] Other (See Comments)  . Ciprofloxacin Hcl Nausea Only  . Diflucan [Fluconazole] Nausea And Vomiting    Current Outpatient Medications  Medication Sig Dispense Refill  . cetirizine (ZYRTEC) 10 MG tablet Take 10 mg by mouth daily.    . cholecalciferol (VITAMIN D) 1000 units tablet Take 1,000 Units by mouth daily.    . cyclobenzaprine (FLEXERIL) 10 MG tablet Take 1 tablet by mouth every 8 (eight) hours as needed.  0  . diltiazem (CARDIZEM) 30 MG tablet Take 1 tablet (30 mg total) by mouth 3 (three) times daily. 270 tablet 3  . fluticasone (FLONASE) 50 MCG/ACT nasal spray Place 1 spray into both nostrils daily.     Marland Kitchen losartan (COZAAR) 25 MG tablet Take 1 tablet (25 mg total) by mouth daily. 90 tablet 3  . Magnesium 250 MG TABS Take 1 tablet by mouth daily.    . Multiple Vitamin (MULTIVITAMIN) tablet Take 1 tablet by mouth daily.    . ondansetron (ZOFRAN) 4 MG tablet Take 1 tablet (4 mg total) by mouth every 4 (four) hours as needed for nausea or vomiting. 30 tablet 2  . pantoprazole (PROTONIX) 20 MG tablet Take 20 mg by mouth daily.    . Polyethylene Glycol 3350 (MIRALAX PO) Take 17 g by mouth daily.    . rizatriptan (MAXALT-MLT) 10 MG disintegrating tablet Take 10 mg by mouth as needed for  migraine. May repeat in 2 hours if needed    . traMADol (ULTRAM) 50 MG tablet TAKE 1 TABLET BY MOUTH 1 2 TIMES A DAY  0  . potassium chloride SA (KLOR-CON M20) 20 MEQ tablet Take 1 tablet (20 mEq total) by mouth daily. 90 tablet 3   No current facility-administered medications for this visit.     Past Medical History:  Diagnosis Date  . Allergic rhinitis   . Fibroids   . Irregular heart beat   . Migraines   . Perimenopause 03/09/2014  . Reflux     Past Surgical History:  Procedure Laterality Date  . BREAST BIOPSY Left 2008   benign  . COLONOSCOPY  06/2011   Dr. Britta Mccreedy: per patient it was normal, follow up in 10 years.   . ESOPHAGOGASTRODUODENOSCOPY     Dr. Berneta Sages: late 1990s/early 2000  . ESOPHAGOGASTRODUODENOSCOPY N/A 05/19/2014   SLF:NO OBVIOUS SOURCE FOR DYSGEUSIA IDENTIFED/MILD Non-erosive gastritis  . INGUINAL HERNIA REPAIR    . KNEE SURGERY Right   . TONSILLECTOMY AND ADENOIDECTOMY      Social History   Socioeconomic History  . Marital status: Divorced    Spouse name: Not on file  . Number of children: Not on file  . Years of education: Not on file  . Highest education level: Not on file  Social Needs  . Financial resource strain: Not on file  .  Food insecurity - worry: Not on file  . Food insecurity - inability: Not on file  . Transportation needs - medical: Not on file  . Transportation needs - non-medical: Not on file  Occupational History  . 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 Smoking status: Never Smoker  . Smokeless tobacco: Never Used  Substance and Sexual Activity  . Alcohol use: No  . Drug use: No  . Sexual activity: Yes    Birth control/protection: None  Other Topics Concern  . Not on file  Social History Narrative  . Not on file     Vitals:   07/18/17 1349  BP: 124/76  Pulse: 89  SpO2: 98%  Weight: 155 lb (70.3 kg)  Height: 5\' 3"  (1.6 m)    Wt Readings from Last 3 Encounters:  07/18/17 155 lb  (70.3 kg)  01/14/17 146 lb 3.2 oz (66.3 kg)  12/23/16 140 lb (63.5 kg)     PHYSICAL EXAM General: NAD HEENT: Normal. Neck: No JVD, no thyromegaly. Lungs: Clear to auscultation bilaterally with normal respiratory effort. CV: Regular rate and rhythm, normal S1/S2, no S3/S4, no murmur. No pretibial or periankle edema.  No carotid bruit.   Abdomen: Soft, nontender, no distention.  Neurologic: Alert and oriented.  Psych: Normal affect. Skin: Normal. Musculoskeletal: No gross deformities.    ECG: Most recent ECG reviewed.   Labs: Lab Results  Component Value Date/Time   K 3.9 01/01/2017 07:56 AM   BUN 12 01/01/2017 07:56 AM   CREATININE 0.92 01/01/2017 07:56 AM   TSH 0.759 09/11/2016 02:25 PM   HGB 11.8 (L) 09/11/2016 02:25 PM   HGB 11.2 05/28/2016 02:32 PM     Lipids: Lab Results  Component Value Date/Time   LDLCALC 132 (H) 10/05/2016 09:24 AM   CHOL 197 10/05/2016 09:24 AM   TRIG 101 10/05/2016 09:24 AM   HDL 45 10/05/2016 09:24 AM       ASSESSMENT AND PLAN:  1. Palpitations/frequent PVC's:Symptomatically stable on diltiazem 30 mg TID. No changes.  2. Essential hypertension: Controlled on losartan 25 mg daily. No changes.  3.  Preoperative risk stratification: She is at  a low risk for major adverse cardiac events for planned right thumb surgery to be performed on 07/31/17.  It sounds like she is going to have a biopsy with local anesthesia.  No cardiac testing is indicated at this time.   Disposition: Follow up 1 year.   Kate Sable, M.D., F.A.C.C.

## 2017-07-22 MED FILL — CETIRIZINE HCL 10 MG TABS: 10 | 100 days supply | Qty: 100 | Fill #1

## 2017-07-24 ENCOUNTER — Ambulatory Visit: Payer: PRIVATE HEALTH INSURANCE | Admitting: Physical Therapy

## 2017-07-24 ENCOUNTER — Encounter: Payer: Self-pay | Admitting: Physical Therapy

## 2017-07-24 DIAGNOSIS — M545 Low back pain: Secondary | ICD-10-CM

## 2017-07-24 DIAGNOSIS — R29898 Other symptoms and signs involving the musculoskeletal system: Secondary | ICD-10-CM

## 2017-07-24 DIAGNOSIS — M6281 Muscle weakness (generalized): Secondary | ICD-10-CM

## 2017-07-24 DIAGNOSIS — M5442 Lumbago with sciatica, left side: Secondary | ICD-10-CM | POA: Diagnosis not present

## 2017-07-24 NOTE — Therapy (Addendum)
[facility] [place], [place], [address] Phone: [phone]   Fax:  [phone]  Physical Therapy Treatment  Patient Details  Name: Rose Delgado MRN: [id number] Date of Birth: [birth year] Referring Provider: Phylliss Bob MD.   Encounter Date: 07/24/2017  PT End of Session - 07/24/17 0941    Visit Number  10    Number of Visits  16    Date for PT Re-Evaluation  07/12/17    PT Start Time  0901    PT Stop Time  0947    PT Time Calculation (min)  46 min    Activity Tolerance  Patient tolerated treatment well    Behavior During Therapy  [facility] for tasks assessed/performed       Past Medical History:  Diagnosis Date  . Allergic rhinitis   . Fibroids   . Irregular heart beat   . Migraines   . Perimenopause 03/09/2014  . Reflux     Past Surgical History:  Procedure Laterality Date  . BREAST BIOPSY Left 2008   benign  . COLONOSCOPY  06/2011   Dr. Britta Mccreedy: per patient it was normal, follow up in 10 years.   . ESOPHAGOGASTRODUODENOSCOPY     Dr. Berneta Sages: late 1990s/early 2000  . ESOPHAGOGASTRODUODENOSCOPY N/A 05/19/2014   SLF:NO OBVIOUS SOURCE FOR DYSGEUSIA IDENTIFED/MILD Non-erosive gastritis  . INGUINAL HERNIA REPAIR    . KNEE SURGERY Right   . TONSILLECTOMY AND ADENOIDECTOMY      There were no vitals filed for this visit.  Subjective Assessment - 07/24/17 0935    Subjective  Patient arrived and reported feeling "the same", patient wil be getting a permanent block on 1/19    Pertinent History  MRI reveals some lower lumbar disc degeneration.    Limitations  Standing;Sitting    How long can you sit comfortably?  15-20 minutes.    How long can you stand comfortably?  10-15 minutes.    Diagnostic tests  MRI.    Patient Stated Goals  Get back to normal with no back pain.    Currently in Pain?  Yes    Pain Score  8     Pain Location  Back    Pain Orientation  Right;Left;Mid;Lower    Pain Descriptors / Indicators   Aching;Spasm;Tightness;Shooting    Pain Onset  More than a month ago    Pain Frequency  Constant    Aggravating Factors   any prolong activity or work     Pain Relieving Factors  rest and injections                      [facility] Adult PT Treatment/Exercise - 07/24/17 0001      Moist Heat Therapy   Number Minutes Moist Heat  15 Minutes    Moist Heat Location  Lumbar Spine      Electrical Stimulation   Electrical Stimulation Location  Lumbar  IFC x 25 mins 80-150hz     Electrical Stimulation Goals  Tone;Pain      Ultrasound   Ultrasound Location  mid paraspinals bil    Ultrasound Parameters  1.5w/cm2/50%/3mz x175m    Ultrasound Goals  Pain      Manual Therapy   Manual Therapy  Soft tissue mobilization;Myofascial release    Manual therapy comments  STW/myofasial release to bil lumbar and thoracic paraspinals due to increased palpable pain and trigger point, to reduce pain and tone  PT Long Term Goals - 07/24/17 9373      PT LONG TERM GOAL #1   Title  Independent with a HEP.    Time  4    Period  Weeks    Status  Achieved      PT LONG TERM GOAL #2   Title  Perform ADL's with pain not > 2/10.    Time  4    Status  Not Met 8/10 pain 07/24/17      PT LONG TERM GOAL #3   Title  Eliminate left LE symptoms.    Time  4    Period  Weeks    Status  Not Met ongoing 07/24/17      PT LONG TERM GOAL #4   Title  Patient to report she has been able to return to full work duties with no pain exacerbation in order to assist in return to PLOF     Time  3    Period  Weeks    Status  Not Met 8/10 with full work duties 07/24/17            Plan - 07/24/17 0944    Clinical Impression Statement  Patient tolerated treatment and felt relief yet temporary overall. Patient reported she would like to be put on hold. Patient unable to meet any further goals due to ongoing reported pain and symptoms.     Rehab Potential  Good    PT Frequency  2x / week     PT Duration  4 weeks    PT Treatment/Interventions  ADLs/Self Care Home Management;Cryotherapy;Electrical Stimulation;Moist Heat;Ultrasound;Patient/family education;Therapeutic exercise;Therapeutic activities;Manual techniques;Dry needling    PT Next Visit Plan  on hold       Patient will benefit from skilled therapeutic intervention in order to improve the following deficits and impairments:  Pain, Decreased activity tolerance, Decreased range of motion  Visit Diagnosis: Acute left-sided low back pain with left-sided sciatica  Acute bilateral low back pain, with sciatica presence unspecified  Muscle weakness (generalized)  Other symptoms and signs involving the musculoskeletal system     Problem List Patient Active Problem List   Diagnosis Date Noted  . Menorrhagia 05/28/2016  . Nausea with vomiting 07/14/2014  . Dysgeusia 03/11/2014  . GERD (gastroesophageal reflux disease) 03/11/2014  . Hot flashes 03/09/2014  . Perimenopause 03/09/2014  . Fibroids 03/09/2014    Tynika Luddy P, PTA 07/24/2017, 9:52 AM  [facility] [place] [place], [place], [address] Phone: [phone]   Fax:  [phone]  Name: Rose Delgado MRN: [id number] Date of Birth: 1960-09-23  PHYSICAL THERAPY DISCHARGE SUMMARY  Visits from Start of Care: 10.  Current functional level related to goals / functional outcomes: See above.   Remaining deficits: Goals unmet.   Education / Equipment: HEP. Plan: Patient agrees to discharge.  Patient goals were not met. Patient is being discharged due to the patient's request.  ?????        Mali Applegate MPT

## 2017-07-25 ENCOUNTER — Encounter (HOSPITAL_BASED_OUTPATIENT_CLINIC_OR_DEPARTMENT_OTHER): Payer: Self-pay | Admitting: *Deleted

## 2017-07-26 ENCOUNTER — Ambulatory Visit: Payer: Self-pay | Admitting: Student

## 2017-07-29 ENCOUNTER — Ambulatory Visit (INDEPENDENT_AMBULATORY_CARE_PROVIDER_SITE_OTHER): Payer: 59 | Admitting: Otolaryngology

## 2017-07-29 DIAGNOSIS — J310 Chronic rhinitis: Secondary | ICD-10-CM

## 2017-07-29 DIAGNOSIS — J343 Hypertrophy of nasal turbinates: Secondary | ICD-10-CM

## 2017-07-31 ENCOUNTER — Ambulatory Visit (HOSPITAL_BASED_OUTPATIENT_CLINIC_OR_DEPARTMENT_OTHER): Payer: 59 | Admitting: Certified Registered"

## 2017-07-31 ENCOUNTER — Other Ambulatory Visit: Payer: Self-pay

## 2017-07-31 ENCOUNTER — Ambulatory Visit (HOSPITAL_BASED_OUTPATIENT_CLINIC_OR_DEPARTMENT_OTHER)
Admission: RE | Admit: 2017-07-31 | Discharge: 2017-07-31 | Disposition: A | Discharge status: Home / Self Care | Payer: 59 | Source: Ambulatory Visit | Attending: Orthopedic Surgery | Admitting: Orthopedic Surgery

## 2017-07-31 ENCOUNTER — Encounter (HOSPITAL_BASED_OUTPATIENT_CLINIC_OR_DEPARTMENT_OTHER): Payer: Self-pay | Admitting: Certified Registered"

## 2017-07-31 ENCOUNTER — Encounter (HOSPITAL_BASED_OUTPATIENT_CLINIC_OR_DEPARTMENT_OTHER)
Admission: RE | Disposition: A | Discharge status: Home / Self Care | Payer: Self-pay | Source: Ambulatory Visit | Attending: Orthopedic Surgery

## 2017-07-31 DIAGNOSIS — E739 Lactose intolerance, unspecified: Secondary | ICD-10-CM | POA: Insufficient documentation

## 2017-07-31 DIAGNOSIS — L608 Other nail disorders: Secondary | ICD-10-CM | POA: Diagnosis not present

## 2017-07-31 DIAGNOSIS — I10 Essential (primary) hypertension: Secondary | ICD-10-CM | POA: Insufficient documentation

## 2017-07-31 DIAGNOSIS — G8929 Other chronic pain: Secondary | ICD-10-CM | POA: Diagnosis not present

## 2017-07-31 DIAGNOSIS — L609 Nail disorder, unspecified: Secondary | ICD-10-CM | POA: Diagnosis not present

## 2017-07-31 DIAGNOSIS — Z91013 Allergy to seafood: Secondary | ICD-10-CM | POA: Insufficient documentation

## 2017-07-31 DIAGNOSIS — K219 Gastro-esophageal reflux disease without esophagitis: Secondary | ICD-10-CM | POA: Insufficient documentation

## 2017-07-31 DIAGNOSIS — Z888 Allergy status to other drugs, medicaments and biological substances status: Secondary | ICD-10-CM | POA: Insufficient documentation

## 2017-07-31 DIAGNOSIS — Z79899 Other long term (current) drug therapy: Secondary | ICD-10-CM | POA: Insufficient documentation

## 2017-07-31 DIAGNOSIS — Z883 Allergy status to other anti-infective agents status: Secondary | ICD-10-CM | POA: Diagnosis not present

## 2017-07-31 DIAGNOSIS — Z881 Allergy status to other antibiotic agents status: Secondary | ICD-10-CM | POA: Insufficient documentation

## 2017-07-31 HISTORY — PX: INCISION AND DRAINAGE: SHX5863

## 2017-07-31 HISTORY — PX: NAILBED REPAIR: SHX5028

## 2017-07-31 SURGERY — INCISION AND DRAINAGE
Anesthesia: Monitor Anesthesia Care | Site: Hand | Laterality: Right

## 2017-07-31 MED ORDER — LIDOCAINE-EPINEPHRINE 1 %-1:100000 IJ SOLN
INTRAMUSCULAR | Status: AC
  Filled 2017-07-31: qty 1

## 2017-07-31 MED ORDER — LACTATED RINGERS IV SOLN
INTRAVENOUS | Status: DC
  Administered 2017-07-31: 07:00:00 via INTRAVENOUS

## 2017-07-31 MED ORDER — FENTANYL CITRATE (PF) 100 MCG/2ML IJ SOLN
INTRAMUSCULAR | Status: AC
  Filled 2017-07-31: qty 2

## 2017-07-31 MED ORDER — BUPIVACAINE HCL (PF) 0.25 % IJ SOLN
INTRAMUSCULAR | Status: DC | PRN
  Administered 2017-07-31: 3 mL

## 2017-07-31 MED ORDER — MIDAZOLAM HCL 2 MG/2ML IJ SOLN
1.0000 mg | INTRAMUSCULAR | Status: DC | PRN
  Administered 2017-07-31: 2 mg via INTRAVENOUS

## 2017-07-31 MED ORDER — SCOPOLAMINE 1 MG/3DAYS TD PT72
1.0000 | MEDICATED_PATCH | Freq: Once | TRANSDERMAL | Status: AC | PRN
  Administered 2017-07-31: 1 via TRANSDERMAL

## 2017-07-31 MED ORDER — LIDOCAINE HCL (CARDIAC) 20 MG/ML IV SOLN
INTRAVENOUS | Status: DC | PRN
  Administered 2017-07-31: 30 mg via INTRAVENOUS

## 2017-07-31 MED ORDER — FENTANYL CITRATE (PF) 100 MCG/2ML IJ SOLN: 25.0000 ug | INTRAMUSCULAR | Status: DC | PRN

## 2017-07-31 MED ORDER — CHLORHEXIDINE GLUCONATE 4 % EX LIQD: 60.0000 mL | Freq: Once | CUTANEOUS | Status: DC

## 2017-07-31 MED ORDER — FENTANYL CITRATE (PF) 100 MCG/2ML IJ SOLN
50.0000 ug | INTRAMUSCULAR | Status: DC | PRN
  Administered 2017-07-31: 100 ug via INTRAVENOUS

## 2017-07-31 MED ORDER — LIDOCAINE HCL (PF) 0.5 % IJ SOLN
INTRAMUSCULAR | Status: DC | PRN
  Administered 2017-07-31: 30 mL via INTRAVENOUS

## 2017-07-31 MED ORDER — SUCCINYLCHOLINE CHLORIDE 200 MG/10ML IV SOSY
PREFILLED_SYRINGE | INTRAVENOUS | Status: AC
  Filled 2017-07-31: qty 10

## 2017-07-31 MED ORDER — BUPIVACAINE HCL (PF) 0.25 % IJ SOLN
INTRAMUSCULAR | Status: AC
  Filled 2017-07-31: qty 30

## 2017-07-31 MED ORDER — PROPOFOL 500 MG/50ML IV EMUL
INTRAVENOUS | Status: AC
  Filled 2017-07-31: qty 50

## 2017-07-31 MED ORDER — OXYCODONE HCL 5 MG/5ML PO SOLN: 5.0000 mg | Freq: Once | ORAL | Status: DC | PRN

## 2017-07-31 MED ORDER — EPHEDRINE 5 MG/ML INJ
INTRAVENOUS | Status: AC
  Filled 2017-07-31: qty 10

## 2017-07-31 MED ORDER — SODIUM BICARBONATE 4 % IV SOLN
INTRAVENOUS | Status: AC
  Filled 2017-07-31: qty 5

## 2017-07-31 MED ORDER — CEFAZOLIN SODIUM-DEXTROSE 2-4 GM/100ML-% IV SOLN
2.0000 g | INTRAVENOUS | Status: AC
  Administered 2017-07-31: 2 g via INTRAVENOUS

## 2017-07-31 MED ORDER — PROPOFOL 500 MG/50ML IV EMUL
INTRAVENOUS | Status: DC | PRN
  Administered 2017-07-31: 50 ug/kg/min via INTRAVENOUS

## 2017-07-31 MED ORDER — LIDOCAINE 2% (20 MG/ML) 5 ML SYRINGE
INTRAMUSCULAR | Status: AC
  Filled 2017-07-31: qty 10

## 2017-07-31 MED ORDER — MIDAZOLAM HCL 2 MG/2ML IJ SOLN
INTRAMUSCULAR | Status: AC
  Filled 2017-07-31: qty 2

## 2017-07-31 MED ORDER — OXYCODONE HCL 5 MG PO TABS: 5.0000 mg | ORAL_TABLET | Freq: Once | ORAL | Status: DC | PRN

## 2017-07-31 MED ORDER — DEXAMETHASONE SODIUM PHOSPHATE 10 MG/ML IJ SOLN
INTRAMUSCULAR | Status: AC
  Filled 2017-07-31: qty 2

## 2017-07-31 MED ORDER — ONDANSETRON HCL 4 MG/2ML IJ SOLN
INTRAMUSCULAR | Status: AC
Start: 2017-07-31 — End: 2017-07-31
  Filled 2017-07-31: qty 8

## 2017-07-31 MED ORDER — BUPIVACAINE HCL (PF) 0.25 % IJ SOLN
INTRAMUSCULAR | Status: AC
  Filled 2017-07-31: qty 60

## 2017-07-31 MED ORDER — CEFAZOLIN SODIUM-DEXTROSE 2-4 GM/100ML-% IV SOLN
INTRAVENOUS | Status: AC
  Filled 2017-07-31: qty 100

## 2017-07-31 SURGICAL SUPPLY — 67 items
APL SKNCLS STERI-STRIP NONHPOA (GAUZE/BANDAGES/DRESSINGS)
BAG DECANTER FOR FLEXI CONT (MISCELLANEOUS) IMPLANT
BANDAGE ACE 3X5.8 VEL STRL LF (GAUZE/BANDAGES/DRESSINGS) ×2 IMPLANT
BANDAGE ACE 4X5 VEL STRL LF (GAUZE/BANDAGES/DRESSINGS) IMPLANT
BENZOIN TINCTURE PRP APPL 2/3 (GAUZE/BANDAGES/DRESSINGS) IMPLANT
BLADE SURG 15 STRL LF DISP TIS (BLADE) ×1 IMPLANT
BLADE SURG 15 STRL SS (BLADE) ×2
BNDG CMPR 9X4 STRL LF SNTH (GAUZE/BANDAGES/DRESSINGS) ×1
BNDG COHESIVE 1X5 TAN STRL LF (GAUZE/BANDAGES/DRESSINGS) ×1 IMPLANT
BNDG ESMARK 4X9 LF (GAUZE/BANDAGES/DRESSINGS) ×1 IMPLANT
BNDG GAUZE ELAST 4 BULKY (GAUZE/BANDAGES/DRESSINGS) IMPLANT
CORD BIPOLAR FORCEPS 12FT (ELECTRODE) ×2 IMPLANT
COVER BACK TABLE 60X90IN (DRAPES) ×2 IMPLANT
CUFF TOURNIQUET SINGLE 18IN (TOURNIQUET CUFF) IMPLANT
DECANTER SPIKE VIAL GLASS SM (MISCELLANEOUS) IMPLANT
DRAPE EXTREMITY T 121X128X90 (DRAPE) ×2 IMPLANT
DRAPE SURG 17X23 STRL (DRAPES) ×2 IMPLANT
DURAPREP 26ML APPLICATOR (WOUND CARE) ×2 IMPLANT
GAUZE PACKING IODOFORM 1/4X15 (GAUZE/BANDAGES/DRESSINGS) IMPLANT
GAUZE SPONGE 4X4 12PLY STRL (GAUZE/BANDAGES/DRESSINGS) ×2 IMPLANT
GAUZE XEROFORM 1X8 LF (GAUZE/BANDAGES/DRESSINGS) ×1 IMPLANT
GLOVE SURG SYN 8.0 (GLOVE) ×4 IMPLANT
GLOVE SURG SYN 8.0 PF PI (GLOVE) ×2 IMPLANT
GOWN STRL REIN XL XLG (GOWN DISPOSABLE) ×2 IMPLANT
GOWN STRL REUS W/ TWL LRG LVL3 (GOWN DISPOSABLE) ×1 IMPLANT
GOWN STRL REUS W/ TWL XL LVL3 (GOWN DISPOSABLE) ×1 IMPLANT
GOWN STRL REUS W/TWL LRG LVL3 (GOWN DISPOSABLE) ×2
GOWN STRL REUS W/TWL XL LVL3 (GOWN DISPOSABLE)
HANDPIECE INTERPULSE COAX TIP (DISPOSABLE)
IV NS IRRIG 3000ML ARTHROMATIC (IV SOLUTION) IMPLANT
LOOP VESSEL MAXI BLUE (MISCELLANEOUS) IMPLANT
NDL HYPO 25X1 1.5 SAFETY (NEEDLE) IMPLANT
NDL PRECISIONGLIDE 27X1.5 (NEEDLE) IMPLANT
NEEDLE HYPO 25X1 1.5 SAFETY (NEEDLE) ×2 IMPLANT
NEEDLE PRECISIONGLIDE 27X1.5 (NEEDLE) IMPLANT
NS IRRIG 1000ML POUR BTL (IV SOLUTION) ×1 IMPLANT
PACK BASIN DAY SURGERY FS (CUSTOM PROCEDURE TRAY) ×2 IMPLANT
PAD CAST 3X4 CTTN HI CHSV (CAST SUPPLIES) ×1 IMPLANT
PADDING CAST ABS 3INX4YD NS (CAST SUPPLIES)
PADDING CAST ABS 4INX4YD NS (CAST SUPPLIES)
PADDING CAST ABS COTTON 3X4 (CAST SUPPLIES) ×1 IMPLANT
PADDING CAST ABS COTTON 4X4 ST (CAST SUPPLIES) ×1 IMPLANT
PADDING CAST COTTON 3X4 STRL (CAST SUPPLIES)
SET HNDPC FAN SPRY TIP SCT (DISPOSABLE) IMPLANT
SHEET MEDIUM DRAPE 40X70 STRL (DRAPES) ×2 IMPLANT
SPLINT PLASTER CAST XFAST 3X15 (CAST SUPPLIES) IMPLANT
SPLINT PLASTER CAST XFAST 4X15 (CAST SUPPLIES) ×5 IMPLANT
SPLINT PLASTER XTRA FAST SET 4 (CAST SUPPLIES)
SPLINT PLASTER XTRA FASTSET 3X (CAST SUPPLIES)
STOCKINETTE 4X48 STRL (DRAPES) ×2 IMPLANT
STRIP CLOSURE SKIN 1/2X4 (GAUZE/BANDAGES/DRESSINGS) IMPLANT
SUT ETHILON 3 0 PS 1 (SUTURE) IMPLANT
SUT ETHILON 5 0 PS 2 18 (SUTURE) IMPLANT
SUT PROLENE 3 0 PS 2 (SUTURE) IMPLANT
SUT VIC AB 4-0 P-3 18XBRD (SUTURE) IMPLANT
SUT VIC AB 4-0 P3 18 (SUTURE)
SUT VICRYL RAPIDE 4-0 (SUTURE) IMPLANT
SUT VICRYL RAPIDE 4/0 PS 2 (SUTURE) IMPLANT
SWAB COLLECTION DEVICE MRSA (MISCELLANEOUS) IMPLANT
SWAB CULTURE ESWAB REG 1ML (MISCELLANEOUS) IMPLANT
SYR 10ML LL (SYRINGE) ×2 IMPLANT
SYR BULB 3OZ (MISCELLANEOUS) ×2 IMPLANT
SYR CONTROL 10ML LL (SYRINGE) IMPLANT
TOWEL OR 17X24 6PK STRL BLUE (TOWEL DISPOSABLE) ×2 IMPLANT
TUBE CONNECTING 20X1/4 (TUBING) IMPLANT
UNDERPAD 30X30 (UNDERPADS AND DIAPERS) ×2 IMPLANT
YANKAUER SUCT BULB TIP NO VENT (SUCTIONS) IMPLANT

## 2017-07-31 NOTE — Anesthesia Preprocedure Evaluation (Signed)
Anesthesia Evaluation  Patient identified by MRN, date of birth, ID band Patient awake    Reviewed: Allergy & Precautions, NPO status , Patient's Chart, lab work & pertinent test results  History of Anesthesia Complications Negative for: history of anesthetic complications  Airway Mallampati: II  TM Distance: >3 FB Neck ROM: Full    Dental  (+) Teeth Intact   Pulmonary neg pulmonary ROS,    breath sounds clear to auscultation       Cardiovascular hypertension, Pt. on medications + dysrhythmias  Rhythm:Regular     Neuro/Psych  Headaches, negative psych ROS   GI/Hepatic Neg liver ROS, GERD  Medicated and Controlled,  Endo/Other  negative endocrine ROS  Renal/GU negative Renal ROS     Musculoskeletal negative musculoskeletal ROS (+)   Abdominal   Peds  Hematology negative hematology ROS (+)   Anesthesia Other Findings   Reproductive/Obstetrics                             Anesthesia Physical Anesthesia Plan  ASA: II  Anesthesia Plan: MAC and Bier Block and Bier Block-LIDOCAINE ONLY   Post-op Pain Management:    Induction:   PONV Risk Score and Plan: 2 and Ondansetron  Airway Management Planned: Nasal Cannula  Additional Equipment: None  Intra-op Plan:   Post-operative Plan:   Informed Consent: I have reviewed the patients History and Physical, chart, labs and discussed the procedure including the risks, benefits and alternatives for the proposed anesthesia with the patient or authorized representative who has indicated his/her understanding and acceptance.   Dental advisory given  Plan Discussed with: CRNA and Surgeon  Anesthesia Plan Comments:         Anesthesia Quick Evaluation

## 2017-07-31 NOTE — Anesthesia Postprocedure Evaluation (Signed)
Anesthesia Post Note  Patient: Rose Delgado  Procedure(s) Performed: INCISION AND DRAINAGE RIGHT THUMB NAIL BED (Right Hand) NAILBED BIOPSY (Right Hand)     Patient location during evaluation: PACU Anesthesia Type: MAC and Bier Block Level of consciousness: awake and alert Pain management: pain level controlled Vital Signs Assessment: post-procedure vital signs reviewed and stable Respiratory status: spontaneous breathing, nonlabored ventilation, respiratory function stable and patient connected to nasal cannula oxygen Cardiovascular status: stable and blood pressure returned to baseline Postop Assessment: no apparent nausea or vomiting Anesthetic complications: no    Last Vitals:  Vitals:   07/31/17 0832 07/31/17 0845  BP:  127/75  Pulse: 84 80  Resp: 19 18  Temp:  36.4 C  SpO2: 100% 100%    Last Pain:  Vitals:   07/31/17 0845  TempSrc:   PainSc: 0-No pain                 Niaomi Cartaya

## 2017-07-31 NOTE — Op Note (Signed)
Please see dictated report [phone]

## 2017-07-31 NOTE — Transfer of Care (Signed)
Immediate Anesthesia Transfer of Care Note  Patient: Rose Delgado  Procedure(s) Performed: INCISION AND DRAINAGE RIGHT THUMB NAIL BED (Right Hand) NAILBED BIOPSY (Right Hand)  Patient Location: PACU  Anesthesia Type:Bier block  Level of Consciousness: awake, alert , oriented and patient cooperative  Airway & Oxygen Therapy: Patient Spontanous Breathing and Patient connected to face mask oxygen  Post-op Assessment: Report given to RN and Post -op Vital signs reviewed and stable  Post vital signs: Reviewed and stable  Last Vitals:  Vitals:   07/31/17 0636 07/31/17 0804  BP: (!) 156/74   Pulse: 84   Resp: 18   Temp: 36.7 C (P) 36.6 C  SpO2: 100% (P) 100%    Last Pain:  Vitals:   07/31/17 0636  TempSrc: Oral  PainSc: 0-No pain      Patients Stated Pain Goal: 2 (26/94/85 4627)  Complications: No apparent anesthesia complications

## 2017-07-31 NOTE — Discharge Instructions (Signed)

## 2017-07-31 NOTE — Anesthesia Procedure Notes (Signed)
Procedure Name: MAC Date/Time: 07/31/2017 7:52 AM Performed by: Signe Colt, CRNA Pre-anesthesia Checklist: Patient identified, Emergency Drugs available, Suction available, Patient being monitored and Timeout performed Patient Re-evaluated:Patient Re-evaluated prior to induction Oxygen Delivery Method: Simple face mask

## 2017-07-31 NOTE — Anesthesia Procedure Notes (Signed)
Anesthesia Regional Block: Bier block (IV Regional)   Pre-Anesthetic Checklist: ,, timeout performed, Correct Patient, Correct Site, Correct Laterality, Correct Procedure,, site marked, surgical consent,, at surgeon's request  Laterality: Right     Needles:  Injection technique: Single-shot  Needle Type: Other      Needle Gauge: 20     Additional Needles:   Procedures:,,,,, intact distal pulses, Esmarch exsanguination, single tourniquet utilized,  Narrative:   Performed by: Personally       

## 2017-07-31 NOTE — Op Note (Signed)
NAME:  Rose Delgado, Rose Delgado                   ACCOUNT NO.:  MEDICAL RECORD NO.:  [id number]  LOCATION:                                 FACILITY:  PHYSICIAN:  Sheral Apley. Meranda Dechaine, M.D.DATE OF BIRTH:  [birth year]  DATE OF PROCEDURE:  07/31/2017 DATE OF DISCHARGE:                              OPERATIVE REPORT   LOCATION:  [facility]  PREOPERATIVE DIAGNOSIS:  Chronic painful right thumb nail deformity.  POSTOPERATIVE DIAGNOSIS:  Chronic painful right thumb nail deformity.  PROCEDURE:  Incision and drainage of right thumb nail bed with nail bed biopsy.  SURGEON:  Sheral Apley. Burney Gauze, MD.  ASSISTANT:  None.  ANESTHESIA:  Regional, Bier block.  COMPLICATION:  None.  DRAINS:  None.  SPECIMEN:  One specimen sent.  DESCRIPTION OF PROCEDURE:  The patient was taken the operating suite and after induction of adequate IV sedation and a regional Bier block forearm anesthetic on the right, the right upper extremity was prepped and draped in the usual sterile fashion.  A Freer elevator was used to elevate the nail plate off the underlying nail bed.  It was carefully removed and placed in Betadine.  We biopsied the distal aspect of the nail bed along the radial side where there were some discoloration and deformity.  This was sent for pathologic confirmation.  We thoroughly irrigated it and then placed the nail plate back under the eponychial fold.  We then dressed with Xeroform, 4 x 4's, and a compression wrap. The patient tolerated this procedure well, went to the recovery room in stable fashion.     Sheral Apley Burney Gauze, M.D.     MAW/MEDQ  D:  07/31/2017  T:  07/31/2017  Job:  [id number]

## 2017-07-31 NOTE — H&P (Signed)
Rose Delgado is an 57 y.o. female.   Chief Complaint: right thumb nail platepain and deformity HPI: patient's a very pleasant 57 year old female with a chronic right thumb nailplate deformity with pain and discoloration.  Past Medical History:  Diagnosis Date  . Allergic rhinitis   . Fibroids   . Irregular heart beat   . Migraines   . Perimenopause 03/09/2014  . Reflux     Past Surgical History:  Procedure Laterality Date  . BREAST BIOPSY Left 2008   benign  . COLONOSCOPY  06/2011   Dr. Britta Mccreedy: per patient it was normal, follow up in 10 years.   . ESOPHAGOGASTRODUODENOSCOPY     Dr. Berneta Sages: late 1990s/early 2000  . ESOPHAGOGASTRODUODENOSCOPY N/A 05/19/2014   SLF:NO OBVIOUS SOURCE FOR DYSGEUSIA IDENTIFED/MILD Non-erosive gastritis  . INGUINAL HERNIA REPAIR    . KNEE SURGERY Right   . TONSILLECTOMY AND ADENOIDECTOMY      Family History  Problem Relation Age of Onset  . Other Mother        glaucoma; pacemaker  . Hypertension Mother   . Other Father        aneursym  . Cancer Sister        biliary, deceased age 91  . Hypertension Sister   . Cancer Brother        lung  . Hypertension Brother   . Sleep apnea Brother   . Hypertension Brother   . Hypertension Brother   . Hypertension Sister   . Hypertension Sister   . Hypertension Sister   . Colon cancer Neg Hx    Social History:  reports that  has never smoked. she has never used smokeless tobacco. She reports that she does not drink alcohol or use drugs.  Allergies:  Allergies  Allergen Reactions  . Shellfish Allergy Anaphylaxis  . Flagyl [Metronidazole] Nausea And Vomiting  . Lactose Intolerance (Gi) Nausea And Vomiting    And diarrhea  . Topamax [Topiramate] Other (See Comments)    "feeling of doom"  . Ciprofloxacin Hcl Nausea Only  . Diflucan [Fluconazole] Nausea And Vomiting    Medications Prior to Admission  Medication Sig Dispense Refill  . cetirizine (ZYRTEC) 10 MG tablet Take 10 mg by mouth  daily.    . cholecalciferol (VITAMIN D) 1000 units tablet Take 1,000 Units by mouth daily.    . cyclobenzaprine (FLEXERIL) 10 MG tablet Take 1 tablet by mouth every 8 (eight) hours as needed.  0  . diltiazem (CARDIZEM) 30 MG tablet Take 1 tablet (30 mg total) by mouth 3 (three) times daily. 270 tablet 3  . fluticasone (FLONASE) 50 MCG/ACT nasal spray Place 1 spray into both nostrils daily.     Marland Kitchen losartan (COZAAR) 25 MG tablet Take 1 tablet (25 mg total) by mouth daily. 90 tablet 3  . Magnesium 250 MG TABS Take 1 tablet by mouth daily.    . Multiple Vitamin (MULTIVITAMIN) tablet Take 1 tablet by mouth daily.    . ondansetron (ZOFRAN) 4 MG tablet Take 1 tablet (4 mg total) by mouth every 4 (four) hours as needed for nausea or vomiting. 30 tablet 2  . pantoprazole (PROTONIX) 20 MG tablet Take 20 mg by mouth daily.    . Polyethylene Glycol 3350 (MIRALAX PO) Take 17 g by mouth daily.    . rizatriptan (MAXALT-MLT) 10 MG disintegrating tablet Take 10 mg by mouth as needed for migraine. May repeat in 2 hours if needed    . traMADol (ULTRAM) 50 MG tablet  TAKE 1 TABLET BY MOUTH 1 2 TIMES A DAY  0    No results found for this or any previous visit (from the past 69 hour(s)). No results found.  Review of Systems  All other systems reviewed and are negative.   Blood pressure (!) 156/74, pulse 84, temperature 98.1 F (36.7 C), temperature source Oral, resp. rate 18, height 5\' 3"  (1.6 m), weight 68.5 kg (151 lb), last menstrual period 07/29/2017, SpO2 100 %. Physical Exam  Constitutional: She is oriented to person, place, and time. She appears well-developed and well-nourished.  HENT:  Head: Normocephalic and atraumatic.  Neck: Normal range of motion.  Cardiovascular: Normal rate.  Respiratory: Effort normal.  Musculoskeletal:       Right hand: She exhibits tenderness and deformity.  Right thumb nailplate deformity with pain and discoloration  Neurological: She is alert and oriented to person,  place, and time.  Skin: Skin is warm.  Psychiatric: She has a normal mood and affect. Her behavior is normal. Judgment and thought content normal.     Assessment/Plan 57 year old female with right thumb nail plate pain and deformity chronic in nature. Have discussed the role of incision and drainage and biopsy of underlying nailbed as an outpatient. Patient understands the risks and benefits and wishes to proceed.  Schuyler Amor, MD 07/31/2017, 7:13 AM

## 2017-08-01 ENCOUNTER — Encounter (HOSPITAL_BASED_OUTPATIENT_CLINIC_OR_DEPARTMENT_OTHER): Payer: Self-pay | Admitting: Orthopedic Surgery

## 2017-08-05 DIAGNOSIS — H01025 Squamous blepharitis left lower eyelid: Secondary | ICD-10-CM | POA: Diagnosis not present

## 2017-08-05 DIAGNOSIS — H01024 Squamous blepharitis left upper eyelid: Secondary | ICD-10-CM | POA: Diagnosis not present

## 2017-08-05 DIAGNOSIS — H01022 Squamous blepharitis right lower eyelid: Secondary | ICD-10-CM | POA: Diagnosis not present

## 2017-08-05 DIAGNOSIS — H40013 Open angle with borderline findings, low risk, bilateral: Secondary | ICD-10-CM | POA: Diagnosis not present

## 2017-08-05 DIAGNOSIS — H01021 Squamous blepharitis right upper eyelid: Secondary | ICD-10-CM | POA: Diagnosis not present

## 2017-08-05 DIAGNOSIS — M7989 Other specified soft tissue disorders: Secondary | ICD-10-CM | POA: Diagnosis not present

## 2017-08-05 DIAGNOSIS — H10413 Chronic giant papillary conjunctivitis, bilateral: Secondary | ICD-10-CM | POA: Diagnosis not present

## 2017-08-05 DIAGNOSIS — M79644 Pain in right finger(s): Secondary | ICD-10-CM | POA: Diagnosis not present

## 2017-08-06 MED FILL — LOSARTAN POTASSIUM 25 MG TA: 25 | 90 days supply | Qty: 90 | Fill #3

## 2017-08-19 DIAGNOSIS — H01025 Squamous blepharitis left lower eyelid: Secondary | ICD-10-CM | POA: Diagnosis not present

## 2017-08-19 DIAGNOSIS — H01024 Squamous blepharitis left upper eyelid: Secondary | ICD-10-CM | POA: Diagnosis not present

## 2017-08-19 DIAGNOSIS — H40013 Open angle with borderline findings, low risk, bilateral: Secondary | ICD-10-CM | POA: Diagnosis not present

## 2017-08-19 DIAGNOSIS — H01021 Squamous blepharitis right upper eyelid: Secondary | ICD-10-CM | POA: Diagnosis not present

## 2017-08-19 DIAGNOSIS — H10413 Chronic giant papillary conjunctivitis, bilateral: Secondary | ICD-10-CM | POA: Diagnosis not present

## 2017-08-19 DIAGNOSIS — H01022 Squamous blepharitis right lower eyelid: Secondary | ICD-10-CM | POA: Diagnosis not present

## 2017-08-26 ENCOUNTER — Other Ambulatory Visit: Payer: Self-pay | Admitting: Cardiovascular Disease

## 2017-08-26 MED FILL — POTASSIUM CL ER 20 MEQ TABL: 20 | 90 days supply | Qty: 90 | Fill #0

## 2017-08-26 MED FILL — dilTIAZem HCL 30 MG TABS: 30 | 90 days supply | Qty: 270 | Fill #3

## 2017-08-26 MED FILL — FLUTICASONE PROP 50 MCG SPR: 50 | 90 days supply | Qty: 48 | Fill #2

## 2017-09-16 DIAGNOSIS — Z23 Encounter for immunization: Secondary | ICD-10-CM | POA: Diagnosis not present

## 2017-09-30 MED FILL — PANTOPRAZOLE SOD DR 40 MG T: 40 | 90 days supply | Qty: 90 | Fill #0

## 2017-10-01 MED FILL — TERCONAZOLE 0.4% VAG CREAM: 0.4 | 7 days supply | Qty: 45 | Fill #1

## 2017-10-10 MED FILL — SM COMPLETE 50+ TABLET: 125 days supply | Qty: 125 | Fill #1

## 2017-10-14 ENCOUNTER — Telehealth: Payer: Self-pay | Admitting: Cardiovascular Disease

## 2017-10-14 DIAGNOSIS — Z79899 Other long term (current) drug therapy: Secondary | ICD-10-CM

## 2017-10-14 NOTE — Telephone Encounter (Signed)
Pt had an allergic reaction to her losartan (COZAAR) 25 MG tablet [[id number]] ENDED  States to call something in to [facility] if it's today

## 2017-10-14 NOTE — Telephone Encounter (Signed)
LM asking pt to call back and tell us what reaction she had to losartan, so I can message the MD

## 2017-10-14 NOTE — Telephone Encounter (Addendum)
Pt has been on losartan for a year and has had intermittent swelling of lips, tongue, tip of nose and has been using Zyrtec throughout this      Best number [phone] to reach pt

## 2017-10-14 NOTE — Telephone Encounter (Signed)
Switch to chlorthalidone 25 mg daily and prescribe KCl 10 meq daily. Check a BMET 5 days after starting. Add losartan to allergy list.

## 2017-10-15 MED ORDER — POTASSIUM CHLORIDE ER 10 MEQ PO TBCR: 10.0000 meq | EXTENDED_RELEASE_TABLET | Freq: Every day | ORAL | 3 refills | Status: DC

## 2017-10-15 MED ORDER — CHLORTHALIDONE 25 MG PO TABS: 25.0000 mg | ORAL_TABLET | Freq: Every day | ORAL | 3 refills | Status: DC

## 2017-10-15 NOTE — Telephone Encounter (Signed)
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

## 2017-10-21 ENCOUNTER — Telehealth: Payer: Self-pay | Admitting: Cardiovascular Disease

## 2017-10-21 NOTE — Telephone Encounter (Signed)
Let's have her wear a 3 week event monitor to document a potential recurrence.

## 2017-10-21 NOTE — Telephone Encounter (Signed)
Patient calling regarding elevated HR of 150 while at rest and patient is taking Diltiazem. Please return call. / tg

## 2017-10-21 NOTE — Telephone Encounter (Signed)
Have her follow up in the office after we get the results.

## 2017-10-21 NOTE — Telephone Encounter (Signed)
I will notify MD for instructions

## 2017-10-21 NOTE — Telephone Encounter (Signed)
Patient was not at work, did not check BP.States it occurred yesterday for a few minutes.She usually runs in the 80's, but the last few weeks her HR has run 98-114 per her fit bit

## 2017-10-21 NOTE — Telephone Encounter (Signed)
How long did episode last? Was it at work? Was it captured on a rhythm strip? What was BP at the time?

## 2017-10-21 NOTE — Telephone Encounter (Signed)
Pt will pick up monitor on 4/24 or 4/25

## 2017-10-24 ENCOUNTER — Encounter (INDEPENDENT_AMBULATORY_CARE_PROVIDER_SITE_OTHER): Payer: 59

## 2017-10-24 ENCOUNTER — Other Ambulatory Visit (HOSPITAL_COMMUNITY)
Admission: RE | Admit: 2017-10-24 | Discharge: 2017-10-24 | Disposition: A | Discharge status: Home / Self Care | Payer: 59 | Source: Ambulatory Visit | Attending: Cardiovascular Disease | Admitting: Cardiovascular Disease

## 2017-10-24 ENCOUNTER — Telehealth: Payer: Self-pay

## 2017-10-24 DIAGNOSIS — R000 Tachycardia, unspecified: Secondary | ICD-10-CM | POA: Diagnosis not present

## 2017-10-24 DIAGNOSIS — E876 Hypokalemia: Secondary | ICD-10-CM

## 2017-10-24 DIAGNOSIS — Z79899 Other long term (current) drug therapy: Secondary | ICD-10-CM | POA: Diagnosis not present

## 2017-10-24 LAB — BASIC METABOLIC PANEL
Anion gap: 11 (ref 5–15)
BUN: 11 mg/dL (ref 6–20)
CO2: 26 mmol/L (ref 22–32)
Calcium: 10 mg/dL (ref 8.9–10.3)
Chloride: 101 mmol/L (ref 101–111)
Creatinine, Ser: 0.78 mg/dL (ref 0.44–1.00)
GFR calc Af Amer: >60 mL/min (ref >60)
GFR calc non Af Amer: >60 mL/min (ref >60)
Glucose, Bld: 150 mg/dL — ABNORMAL HIGH (ref 65–99)
Potassium: 3.3 mmol/L — ABNORMAL LOW (ref 3.5–5.1)
Sodium: 138 mmol/L (ref 135–145)

## 2017-10-24 MED ORDER — POTASSIUM CHLORIDE CRYS ER 20 MEQ PO TBCR: 20.0000 meq | EXTENDED_RELEASE_TABLET | Freq: Every day | ORAL | 3 refills | Status: DC

## 2017-10-24 NOTE — Telephone Encounter (Signed)
LM on pt's personal vm to increase potassium to 20 meq daily, bmet  Lab work at [employer], escribed K+ to

## 2017-10-24 NOTE — Telephone Encounter (Signed)
-----   Message from Herminio Commons, MD sent at 10/24/2017  8:46 AM EDT ----- Is she taking 10 meq KCl as mentioned in my note on 4/15? If so, increase to 20 meq daily as K is mildly low. Repeat BMET in one week.

## 2017-10-25 ENCOUNTER — Other Ambulatory Visit: Payer: Self-pay

## 2017-10-25 DIAGNOSIS — R000 Tachycardia, unspecified: Secondary | ICD-10-CM

## 2017-11-01 ENCOUNTER — Other Ambulatory Visit (HOSPITAL_COMMUNITY)
Admission: RE | Admit: 2017-11-01 | Discharge: 2017-11-01 | Disposition: A | Discharge status: Home / Self Care | Payer: 59 | Source: Ambulatory Visit | Attending: Family Medicine | Admitting: Family Medicine

## 2017-11-01 ENCOUNTER — Telehealth: Payer: Self-pay

## 2017-11-01 ENCOUNTER — Other Ambulatory Visit (HOSPITAL_COMMUNITY)
Admission: RE | Admit: 2017-11-01 | Discharge: 2017-11-01 | Disposition: A | Discharge status: Home / Self Care | Payer: 59 | Source: Ambulatory Visit | Attending: Cardiovascular Disease | Admitting: Cardiovascular Disease

## 2017-11-01 DIAGNOSIS — E876 Hypokalemia: Secondary | ICD-10-CM

## 2017-11-01 DIAGNOSIS — E039 Hypothyroidism, unspecified: Secondary | ICD-10-CM | POA: Insufficient documentation

## 2017-11-01 LAB — CBC
HCT: 43.7 % (ref 36.0–46.0)
Hemoglobin: 14.4 g/dL (ref 12.0–15.0)
MCH: 31.1 pg (ref 26.0–34.0)
MCHC: 33 g/dL (ref 30.0–36.0)
MCV: 94.4 fL (ref 78.0–100.0)
Platelets: 262 10*3/uL (ref 150–400)
RBC: 4.63 MIL/uL (ref 3.87–5.11)
RDW: 12.5 % (ref 11.5–15.5)
WBC: 8.6 10*3/uL (ref 4.0–10.5)

## 2017-11-01 LAB — COMPREHENSIVE METABOLIC PANEL
ALT: 35 U/L (ref 14–54)
AST: 27 U/L (ref 15–41)
Albumin: 4.4 g/dL (ref 3.5–5.0)
Alkaline Phosphatase: 73 U/L (ref 38–126)
Anion gap: 12 (ref 5–15)
BUN: 9 mg/dL (ref 6–20)
CO2: 24 mmol/L (ref 22–32)
Calcium: 10.2 mg/dL (ref 8.9–10.3)
Chloride: 101 mmol/L (ref 101–111)
Creatinine, Ser: 0.78 mg/dL (ref 0.44–1.00)
GFR calc Af Amer: >60 mL/min (ref >60)
GFR calc non Af Amer: >60 mL/min (ref >60)
Glucose, Bld: 114 mg/dL — ABNORMAL HIGH (ref 65–99)
Potassium: 3.2 mmol/L — ABNORMAL LOW (ref 3.5–5.1)
Sodium: 137 mmol/L (ref 135–145)
Total Bilirubin: 0.7 mg/dL (ref 0.3–1.2)
Total Protein: 7.4 g/dL (ref 6.5–8.1)

## 2017-11-01 LAB — BASIC METABOLIC PANEL
Anion gap: 12 (ref 5–15)
BUN: 9 mg/dL (ref 6–20)
CO2: 24 mmol/L (ref 22–32)
Calcium: 10.2 mg/dL (ref 8.9–10.3)
Chloride: 101 mmol/L (ref 101–111)
Creatinine, Ser: 0.82 mg/dL (ref 0.44–1.00)
GFR calc Af Amer: >60 mL/min (ref >60)
GFR calc non Af Amer: >60 mL/min (ref >60)
Glucose, Bld: 123 mg/dL — ABNORMAL HIGH (ref 65–99)
Potassium: 3.4 mmol/L — ABNORMAL LOW (ref 3.5–5.1)
Sodium: 137 mmol/L (ref 135–145)

## 2017-11-01 LAB — LIPID PANEL
Cholesterol: 234 mg/dL — ABNORMAL HIGH (ref 0–200)
HDL: 40 mg/dL — ABNORMAL LOW (ref >40)
LDL Cholesterol: 146 mg/dL — ABNORMAL HIGH (ref 0–99)
Total CHOL/HDL Ratio: 5.9 RATIO
Triglycerides: 242 mg/dL — ABNORMAL HIGH (ref <150)
VLDL: 48 mg/dL — ABNORMAL HIGH (ref 0–40)

## 2017-11-01 LAB — TSH: TSH: 0.461 u[IU]/mL (ref 0.350–4.500)

## 2017-11-01 MED ORDER — POTASSIUM CHLORIDE CRYS ER 20 MEQ PO TBCR: 40.0000 meq | EXTENDED_RELEASE_TABLET | Freq: Every day | ORAL | 3 refills | Status: DC

## 2017-11-01 MED ORDER — CHLORTHALIDONE 25 MG PO TABS: 25.0000 mg | ORAL_TABLET | Freq: Every day | ORAL | 3 refills | Status: DC

## 2017-11-01 MED ORDER — POTASSIUM CHLORIDE CRYS ER 20 MEQ PO TBCR: 20.0000 meq | EXTENDED_RELEASE_TABLET | Freq: Every day | ORAL | 3 refills | Status: DC

## 2017-11-01 NOTE — Telephone Encounter (Signed)
Pt returned event monitor after wearing about 7 days.Electrodes( which are for sensitive skin) caused her severe itching.She will notify [employer] today

## 2017-11-01 NOTE — Telephone Encounter (Signed)
-----   Message from Herminio Commons, MD sent at 11/01/2017  9:11 AM EDT ----- K remains mildly low. Increase KCl to 40 meq daily and repeat BMET in one week.

## 2017-11-01 NOTE — Telephone Encounter (Signed)
Pt notified, exscribed new rx, she will pick up lab slip

## 2017-11-08 ENCOUNTER — Telehealth: Payer: Self-pay

## 2017-11-08 ENCOUNTER — Other Ambulatory Visit (HOSPITAL_COMMUNITY)
Admission: RE | Admit: 2017-11-08 | Discharge: 2017-11-08 | Disposition: A | Discharge status: Home / Self Care | Payer: 59 | Source: Ambulatory Visit | Attending: Cardiovascular Disease | Admitting: Cardiovascular Disease

## 2017-11-08 DIAGNOSIS — E876 Hypokalemia: Secondary | ICD-10-CM

## 2017-11-08 LAB — BASIC METABOLIC PANEL
Anion gap: 8 (ref 5–15)
BUN: 10 mg/dL (ref 6–20)
CO2: 30 mmol/L (ref 22–32)
Calcium: 10.1 mg/dL (ref 8.9–10.3)
Chloride: 99 mmol/L — ABNORMAL LOW (ref 101–111)
Creatinine, Ser: 0.75 mg/dL (ref 0.44–1.00)
GFR calc Af Amer: >60 mL/min (ref >60)
GFR calc non Af Amer: >60 mL/min (ref >60)
Glucose, Bld: 115 mg/dL — ABNORMAL HIGH (ref 65–99)
Potassium: 3.5 mmol/L (ref 3.5–5.1)
Sodium: 137 mmol/L (ref 135–145)

## 2017-11-08 MED ORDER — POTASSIUM CHLORIDE CRYS ER 20 MEQ PO TBCR: 40.0000 meq | EXTENDED_RELEASE_TABLET | Freq: Two times a day (BID) | ORAL | 3 refills | Status: DC

## 2017-11-08 MED ORDER — CHLORTHALIDONE 25 MG PO TABS: 25.0000 mg | ORAL_TABLET | Freq: Every day | ORAL | 3 refills | Status: DC

## 2017-11-08 MED FILL — CHLORTHALIDONE 25 MG TAB: 25 | 90 days supply | Qty: 90 | Fill #0

## 2017-11-08 MED FILL — POTASSIUM CL ER 20 MEQ TABL: 20 | 90 days supply | Qty: 360 | Fill #0

## 2017-11-08 NOTE — Telephone Encounter (Signed)
-----   Message from Herminio Commons, MD sent at 11/08/2017  9:00 AM EDT ----- K at low end of normal. Increase KCl to 40 meq bid and repeat BMET in one week.

## 2017-11-08 NOTE — Telephone Encounter (Signed)
Pt notified of lab results and increase in K+ dosing to 40 meq BID, will repeat BMET in 1 week, lab slip at front desk

## 2017-11-08 NOTE — Telephone Encounter (Signed)
[facility]

## 2017-11-12 MED FILL — CETIRIZINE HCL 10 MG TABS: 10 | 100 days supply | Qty: 100 | Fill #2

## 2017-11-13 ENCOUNTER — Telehealth: Payer: Self-pay | Admitting: Cardiovascular Disease

## 2017-11-13 NOTE — Telephone Encounter (Signed)
Pt stated she's having problems w/ her chlorthalidone (HYGROTON) 25 MG tablet [[id number]]  making her really thirsty.. Can be reached on ext [phone]

## 2017-11-13 NOTE — Telephone Encounter (Addendum)
C/O extreme thirst and as a result has been drinking a lot of water and voiding a lot. Patient thinks it is being caused by her chlorthalidone. No c/o dizziness, lightheadedness, chest pain or sob. Patient denies being diabetic and has not checked her glucose. Please advise.

## 2017-11-14 MED ORDER — HYDRALAZINE HCL 25 MG PO TABS: 25.0000 mg | ORAL_TABLET | Freq: Two times a day (BID) | ORAL | 0 refills | Status: DC

## 2017-11-14 MED FILL — hydrALAZINE HCL 25 MG TABS: 25 | 30 days supply | Qty: 60 | Fill #0

## 2017-11-14 NOTE — Telephone Encounter (Signed)
I spoke with pt, she will stop K+ and chlorthalidone, e-scribed hydralazine 25 mg BID

## 2017-11-14 NOTE — Telephone Encounter (Signed)
If stopping chlorthalidone, should stop KCl as well. Can try hydralazine 25 mg bid.

## 2017-11-15 ENCOUNTER — Encounter (HOSPITAL_COMMUNITY)
Admission: RE | Admit: 2017-11-15 | Discharge: 2017-11-15 | Disposition: A | Discharge status: Home / Self Care | Payer: 59 | Source: Ambulatory Visit | Attending: Cardiovascular Disease | Admitting: Cardiovascular Disease

## 2017-11-15 DIAGNOSIS — E876 Hypokalemia: Secondary | ICD-10-CM | POA: Insufficient documentation

## 2017-11-15 LAB — BASIC METABOLIC PANEL
Anion gap: 8 (ref 5–15)
BUN: 11 mg/dL (ref 6–20)
CO2: 26 mmol/L (ref 22–32)
Calcium: 9.8 mg/dL (ref 8.9–10.3)
Chloride: 101 mmol/L (ref 101–111)
Creatinine, Ser: 0.77 mg/dL (ref 0.44–1.00)
GFR calc Af Amer: >60 mL/min (ref >60)
GFR calc non Af Amer: >60 mL/min (ref >60)
Glucose, Bld: 114 mg/dL — ABNORMAL HIGH (ref 65–99)
Potassium: 3.6 mmol/L (ref 3.5–5.1)
Sodium: 135 mmol/L (ref 135–145)

## 2017-11-21 ENCOUNTER — Telehealth: Payer: Self-pay | Admitting: Cardiovascular Disease

## 2017-11-21 NOTE — Telephone Encounter (Signed)
Per note in chart, she is to stay on magnesium. Pt notified. Voiced understanding.

## 2017-11-21 NOTE — Telephone Encounter (Signed)
Patient wants to know if she is to still be taking magnessium. / tg

## 2017-11-26 ENCOUNTER — Other Ambulatory Visit: Payer: Self-pay | Admitting: Cardiovascular Disease

## 2017-11-26 MED FILL — dilTIAZem HCL 30 MG TABS: 30 | 90 days supply | Qty: 270 | Fill #0

## 2017-11-29 ENCOUNTER — Ambulatory Visit (HOSPITAL_COMMUNITY)
Admission: RE | Admit: 2017-11-29 | Discharge: 2017-11-29 | Disposition: A | Discharge status: Home / Self Care | Payer: 59 | Source: Ambulatory Visit | Attending: Family Medicine | Admitting: Family Medicine

## 2017-11-29 ENCOUNTER — Other Ambulatory Visit (HOSPITAL_COMMUNITY): Payer: Self-pay | Admitting: Family Medicine

## 2017-11-29 DIAGNOSIS — R071 Chest pain on breathing: Secondary | ICD-10-CM

## 2017-11-29 DIAGNOSIS — R079 Chest pain, unspecified: Secondary | ICD-10-CM | POA: Insufficient documentation

## 2017-11-29 DIAGNOSIS — R059 Cough, unspecified: Secondary | ICD-10-CM

## 2017-11-29 DIAGNOSIS — R05 Cough: Secondary | ICD-10-CM

## 2017-12-04 DIAGNOSIS — E559 Vitamin D deficiency, unspecified: Secondary | ICD-10-CM | POA: Diagnosis not present

## 2017-12-04 DIAGNOSIS — I10 Essential (primary) hypertension: Secondary | ICD-10-CM | POA: Diagnosis not present

## 2017-12-04 DIAGNOSIS — G43909 Migraine, unspecified, not intractable, without status migrainosus: Secondary | ICD-10-CM | POA: Diagnosis not present

## 2017-12-04 DIAGNOSIS — Z0001 Encounter for general adult medical examination with abnormal findings: Secondary | ICD-10-CM | POA: Diagnosis not present

## 2017-12-04 DIAGNOSIS — Z6825 Body mass index (BMI) 25.0-25.9, adult: Secondary | ICD-10-CM | POA: Diagnosis not present

## 2017-12-04 DIAGNOSIS — Z1212 Encounter for screening for malignant neoplasm of rectum: Secondary | ICD-10-CM | POA: Diagnosis not present

## 2017-12-04 DIAGNOSIS — E782 Mixed hyperlipidemia: Secondary | ICD-10-CM | POA: Diagnosis not present

## 2017-12-04 DIAGNOSIS — K219 Gastro-esophageal reflux disease without esophagitis: Secondary | ICD-10-CM | POA: Diagnosis not present

## 2017-12-09 ENCOUNTER — Other Ambulatory Visit: Payer: Self-pay | Admitting: Cardiovascular Disease

## 2017-12-10 MED FILL — hydrALAZINE HCL 25 MG TABS: 25 | 90 days supply | Qty: 180 | Fill #0

## 2017-12-30 MED FILL — PANTOPRAZOLE SOD DR 40 MG T: 40 | 90 days supply | Qty: 90 | Fill #1

## 2018-01-10 MED FILL — RIZATRIPTAN BENZOATE 10 MG: 10 | 30 days supply | Qty: 9 | Fill #0

## 2018-01-13 ENCOUNTER — Telehealth: Payer: Self-pay | Admitting: Cardiovascular Disease

## 2018-01-13 NOTE — Telephone Encounter (Signed)
Please call patient regarding chest pain and changing Cartia to long acting / tg

## 2018-01-13 NOTE — Telephone Encounter (Signed)
Returned pt call. She states that for the past month, she has had a sharp stabbing pain that comes and goes right around her left shoulder blade. She has not had any SOB, dizziness. She has had a back injury in that area before but is not sure it is related to that. She also wants to switch her diltiazem to the long acting so she will not have to continue taking it so many times daily. Please advise.

## 2018-01-14 MED ORDER — DILTIAZEM HCL ER COATED BEADS 120 MG PO CP24: 120.0000 mg | ORAL_CAPSULE | Freq: Every day | ORAL | 3 refills | Status: DC

## 2018-01-14 MED FILL — CARTIA XT 120 MG CAPSULE SA: 120 | 90 days supply | Qty: 90 | Fill #0

## 2018-01-14 NOTE — Telephone Encounter (Signed)
Seems atypical for coronary disease. Can switch diltiazem to long-acting 120 mg daily.

## 2018-01-14 NOTE — Telephone Encounter (Signed)
Called pt. Made her aware.

## 2018-02-11 ENCOUNTER — Ambulatory Visit: Payer: 59 | Admitting: Allergy & Immunology

## 2018-02-11 ENCOUNTER — Encounter: Payer: Self-pay | Admitting: Allergy & Immunology

## 2018-02-11 VITALS — BP 132/80 | HR 66 | Temp 97.9°F | Resp 18 | Ht 63.75 in | Wt 144.2 lb

## 2018-02-11 DIAGNOSIS — J310 Chronic rhinitis: Secondary | ICD-10-CM | POA: Diagnosis not present

## 2018-02-11 DIAGNOSIS — T782XXD Anaphylactic shock, unspecified, subsequent encounter: Secondary | ICD-10-CM

## 2018-02-11 MED ORDER — AZELASTINE HCL 0.1 % NA SOLN: 2.0000 | Freq: Two times a day (BID) | NASAL | 5 refills | Status: DC

## 2018-02-11 MED ORDER — EPINEPHRINE 0.3 MG/0.3ML IJ SOAJ: 0.3000 mg | Freq: Once | INTRAMUSCULAR | 2 refills | Status: AC

## 2018-02-11 NOTE — Patient Instructions (Addendum)
1. Chronic rhinitis - We will do testing at the next visit since you took cetirizine. - Stop cetirizine Thursday August 15th. - Continue with: Zyrtec (cetirizine) 10mg  tablet once daily and Flonase (fluticasone) two sprays per nostril daily - Start taking: Astelin (azelastine) 2 sprays per nostril 1-2 times daily as needed - You can use an extra dose of the antihistamine, if needed, for breakthrough symptoms.  - Consider nasal saline rinses 1-2 times daily to remove allergens from the nasal cavities as well as help with mucous clearance (this is especially helpful to do before the nasal sprays are given) - Consider allergy shots as a means of long-term control. - Allergy shots "re-train" and "reset" the immune system to ignore environmental allergens and decrease the resulting immune response to those allergens (sneezing, itchy watery eyes, runny nose, nasal congestion, etc).    - Allergy shots improve symptoms in 75-85% of patients.  - We can discuss more at the next appointment if the medications are not working for you.  2. Anaphylaxis to shellfish - We will test for shellfish allergy at your next appointment.  - In the meantime, we will send in a prescription for an EpiPen.  - Anaphylaxis Management Plan provided.   3. Return in about 6 days (around 02/17/2018).   Please inform us of any Emergency Department visits, hospitalizations, or changes in symptoms. Call us before going to the ED for breathing or allergy symptoms since we might be able to fit you in for a sick visit. Feel free to contact us anytime with any questions, problems, or concerns.  It was a pleasure to meet you today!  Websites that have reliable patient information: 1. American Academy of Asthma, Allergy, and Immunology: www.aaaai.org 2. Food Allergy Research and Education (FARE): foodallergy.org 3. Mothers of Asthmatics: http://www.asthmacommunitynetwork.org 4. American College of Allergy, Asthma, and Immunology:  MonthlyElectricBill.co.uk   Make sure you are registered to vote! If you have moved or changed any of your contact information, you will need to get this updated before voting!

## 2018-02-11 NOTE — Progress Notes (Signed)
NEW PATIENT  Date of Service/Encounter:  02/11/18  Referring provider: Caryl Bis, MD   Assessment:   Chronic rhinitis - could not do testing since she took cetirizine this morning  Anaphylaxis to shellfish  Plan/Recommendations:   1. Chronic rhinitis - We will do testing at the next visit since you took cetirizine. - Stop cetirizine Thursday August 15th. - Continue with: Zyrtec (cetirizine) 10mg  tablet once daily and Flonase (fluticasone) two sprays per nostril daily - Start taking: Astelin (azelastine) 2 sprays per nostril 1-2 times daily as needed - You can use an extra dose of the antihistamine, if needed, for breakthrough symptoms.  - Consider nasal saline rinses 1-2 times daily to remove allergens from the nasal cavities as well as help with mucous clearance (this is especially helpful to do before the nasal sprays are given) - Consider allergy shots as a means of long-term control. - Allergy shots "re-train" and "reset" the immune system to ignore environmental allergens and decrease the resulting immune response to those allergens (sneezing, itchy watery eyes, runny nose, nasal congestion, etc).    - Allergy shots improve symptoms in 75-85% of patients.  - We can discuss more at the next appointment if the medications are not working for you.  2. Anaphylaxis to shellfish - We will test for shellfish allergy at your next appointment.  - In the meantime, we will send in a prescription for an EpiPen.  - Anaphylaxis Management Plan provided.    3. Return in about 6 days (around 02/17/2018).    Subjective:   Rose Delgado is a 57 y.o. female presenting today for evaluation of  Chief Complaint  Patient presents with  . Allergic Reaction  . Nasal Congestion  . Cough    Rose Delgado has a history of the following: Patient Active Problem List   Diagnosis Date Noted  . Menorrhagia 05/28/2016  . Nausea with vomiting 07/14/2014  . Dysgeusia 03/11/2014  .  GERD (gastroesophageal reflux disease) 03/11/2014  . Hot flashes 03/09/2014  . Perimenopause 03/09/2014  . Fibroids 03/09/2014    History obtained from: chart review and patient.  Rose Delgado was referred by Caryl Bis, MD.     Rose Delgado is a 57 y.o. female presenting for an evaluation of allergic rhinitis. She did take a Zyrtec last night, unfortunately, and she tells [place] that this was not written on her paperwork from [place]. I did clarify that it did say that.    Allergic Rhinitis Symptom History: She has had allergies for years. She reports nasal congestion and itchy watery eyes. She has chronic nasal congestion. She is on Flonase as well as Claritin for her symptoms. She has been tested in the past but she does not remember what was positive. She denies sinus infections but she used to in the past.   Food Allergy Symptom History: She did break out in hives around shellfish when she was a teen. She thinks that this was shrimp and crab legs. She developed swelling of the mouth and the skin. She has never had an EpiPen.  She does have a history of metoprolol allergy. This was started for migraines, but she developed anaphylaxis to this. She is now on Maxalt as needed.   Otherwise, there is no history of other atopic diseases, including asthma, drug allergies, stinging insect allergies, or urticaria. There is no significant infectious history. Vaccinations are up to date.    Past Medical History: Patient Active Problem List  Diagnosis Date Noted  . Menorrhagia 05/28/2016  . Nausea with vomiting 07/14/2014  . Dysgeusia 03/11/2014  . GERD (gastroesophageal reflux disease) 03/11/2014  . Hot flashes 03/09/2014  . Perimenopause 03/09/2014  . Fibroids 03/09/2014    Medication List:  Allergies as of 02/11/2018      Reactions   Metoprolol Anaphylaxis   Shellfish Allergy Anaphylaxis   Flagyl [metronidazole] Nausea And Vomiting   Lactose Intolerance (gi) Nausea And Vomiting   And  diarrhea   Losartan Swelling   Topamax [topiramate] Other (See Comments)   "feeling of doom"   Ciprofloxacin Hcl Nausea Only   Diflucan [fluconazole] Nausea And Vomiting      Medication List        Accurate as of 02/11/18  3:54 PM. Always use your most recent med list.          azelastine 0.1 % nasal spray Commonly known as:  ASTELIN Place 2 sprays into both nostrils 2 (two) times daily. Use in each nostril as directed   BIOTIN 5000 5 MG Caps Generic drug:  Biotin Take 1 capsule by mouth daily.   cetirizine 10 MG tablet Commonly known as:  ZYRTEC Take 10 mg by mouth daily.   cholecalciferol 1000 units tablet Commonly known as:  VITAMIN D Take 1,000 Units by mouth daily.   cyclobenzaprine 10 MG tablet Commonly known as:  FLEXERIL Take 1 tablet by mouth every 8 (eight) hours as needed.   diltiazem 120 MG 24 hr capsule Commonly known as:  CARDIZEM CD Take 1 capsule (120 mg total) by mouth daily.   EPINEPHrine 0.3 mg/0.3 mL Soaj injection Commonly known as:  EPI-PEN Inject 0.3 mLs (0.3 mg total) into the muscle once for 1 dose.   fluticasone 50 MCG/ACT nasal spray Commonly known as:  FLONASE Place 1 spray into both nostrils daily.   hydrALAZINE 25 MG tablet Commonly known as:  APRESOLINE Take 1 tablet (25 mg total) by mouth 2 (two) times daily.   Magnesium 250 MG Tabs Take 1 tablet by mouth daily.   MIRALAX PO Take 17 g by mouth daily.   multivitamin tablet Take 1 tablet by mouth daily.   ondansetron 4 MG tablet Commonly known as:  ZOFRAN Take 1 tablet (4 mg total) by mouth every 4 (four) hours as needed for nausea or vomiting.   oxyCODONE-acetaminophen 10-325 MG tablet Commonly known as:  PERCOCET Take 1 tablet by mouth every 4 (four) hours as needed for pain.   pantoprazole 20 MG tablet Commonly known as:  PROTONIX Take 20 mg by mouth daily.   rizatriptan 10 MG disintegrating tablet Commonly known as:  MAXALT-MLT Take 10 mg by mouth as needed  for migraine. May repeat in 2 hours if needed   ROBAXIN 500 MG tablet Generic drug:  methocarbamol Take 500 mg by mouth 4 (four) times daily.   traMADol 50 MG tablet Commonly known as:  ULTRAM TAKE 1 TABLET BY MOUTH 1 2 TIMES A DAY   vitamin E 100 UNIT capsule Take 100 Units by mouth daily.       Birth History: non-contributory.    Developmental History: non-contributory.   Past Surgical History: Past Surgical History:  Procedure Laterality Date  . ADENOIDECTOMY    . BREAST BIOPSY Left 2008   benign  . COLONOSCOPY  06/2011   Dr. Britta Mccreedy: per patient it was normal, follow up in 10 years.   . ESOPHAGOGASTRODUODENOSCOPY     Dr. Berneta Sages: late 1990s/early 2000  . ESOPHAGOGASTRODUODENOSCOPY N/A  05/19/2014   SLF:NO OBVIOUS SOURCE FOR DYSGEUSIA IDENTIFED/MILD Non-erosive gastritis  . INCISION AND DRAINAGE Right 07/31/2017   Procedure: INCISION AND DRAINAGE RIGHT THUMB NAIL BED;  Surgeon: Charlotte Crumb, MD;  Location: [facility];  Service: Orthopedics;  Laterality: Right;  . INGUINAL HERNIA REPAIR    . KNEE SURGERY Right   . NAILBED REPAIR Right 07/31/2017   Procedure: NAILBED BIOPSY;  Surgeon: Charlotte Crumb, MD;  Location: [facility];  Service: Orthopedics;  Laterality: Right;  . TONSILLECTOMY    . TONSILLECTOMY AND ADENOIDECTOMY       Family History: Family History  Problem Relation Age of Onset  . Other Mother        glaucoma; pacemaker  . Hypertension Mother   . Other Father        aneursym  . Cancer Sister        biliary, deceased age 11  . Hypertension Sister   . Cancer Brother        lung  . Hypertension Brother   . Sleep apnea Brother   . Hypertension Brother   . Hypertension Brother   . Hypertension Sister   . Hypertension Sister   . Hypertension Sister   . Colon cancer Neg Hx      Social History: Rose Delgado lives at home in a house that has hardwood throughout the home. She has electric heating and central cooling.  There are some window units as well. There is a dog in the home. There are dust mite coverings on the bed, but not the pillows. There is no tobacco exposure in the home or the car. 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     Review of Systems: a 14-point review of systems is pertinent for what is mentioned in HPI.  Otherwise, all other systems were negative. Constitutional: negative other than that listed in the HPI Eyes: negative other than that listed in the HPI Ears, nose, mouth, throat, and face: negative other than that listed in the HPI Respiratory: negative other than that listed in the HPI Cardiovascular: negative other than that listed in the HPI Gastrointestinal: negative other than that listed in the HPI Genitourinary: negative other than that listed in the HPI Integument: negative other than that listed in the HPI Hematologic: negative other than that listed in the HPI Musculoskeletal: negative other than that listed in the HPI Neurological: negative other than that listed in the HPI Allergy/Immunologic: negative other than that listed in the HPI    Objective:   Blood pressure 132/80, pulse 66, temperature 97.9 F (36.6 C), temperature source Oral, resp. rate 18, height 5' 3.75" (1.619 m), weight 144 lb 3.2 oz (65.4 kg), last menstrual period 07/29/2017, SpO2 98 %. Body mass index is 24.95 kg/m.   Physical Exam:  General: Alert, interactive, in no acute distress. Eyes: No conjunctival injection bilaterally, no discharge on the right, no discharge on the left and no Horner-Trantas dots present. PERRL bilaterally. EOMI without pain. No photophobia.  Ears: Right TM pearly gray with normal light reflex, Left TM pearly gray with normal light reflex, Right TM intact without perforation and Left TM intact without perforation.  Nose/Throat: External nose within normal limits and septum midline.  Turbinates edematous and pale with clear discharge. Posterior oropharynx mildly erythematous without cobblestoning in the posterior oropharynx. Tonsils 2+ without exudates.  Tongue without thrush. Neck: Supple  without thyromegaly. Trachea midline. Adenopathy: no enlarged lymph nodes appreciated in the anterior cervical, occipital, axillary, epitrochlear, inguinal, or popliteal regions. Lungs: Clear to auscultation without wheezing, rhonchi or rales. No increased work of breathing. CV: Normal S1/S2. No murmurs. Capillary refill <2 seconds.  Abdomen: Nondistended, nontender. No guarding or rebound tenderness. Bowel sounds present in all fields and hypoactive  Skin: Warm and dry, without lesions or rashes. Extremities:  No clubbing, cyanosis or edema. Neuro:   Grossly intact. No focal deficits appreciated. Responsive to questions.  Diagnostic studies: deferred since she had antihistamines     Salvatore Marvel, MD [facility]

## 2018-02-12 ENCOUNTER — Telehealth: Payer: Self-pay

## 2018-02-12 ENCOUNTER — Other Ambulatory Visit: Payer: Self-pay | Admitting: *Deleted

## 2018-02-12 MED ORDER — EPINEPHRINE 0.3 MG/0.3ML IJ SOAJ: 0.3000 mg | Freq: Once | INTRAMUSCULAR | 2 refills | Status: AC

## 2018-02-12 NOTE — Telephone Encounter (Signed)
Patient was seen on yesterday with Dr Ernst Bowler. She was told if her epi pen was more than $5 to call back and get it sent some where else. Patient would like to see if it will be cheaper [facility] sent to [facility].

## 2018-02-12 NOTE — Telephone Encounter (Signed)
Epipen has been sent to the [facility] and will be processed and sent to [place]. I called the patient and informed her.

## 2018-02-17 ENCOUNTER — Ambulatory Visit (INDEPENDENT_AMBULATORY_CARE_PROVIDER_SITE_OTHER): Payer: 59 | Admitting: Allergy

## 2018-02-17 ENCOUNTER — Encounter: Payer: Self-pay | Admitting: Allergy

## 2018-02-17 ENCOUNTER — Other Ambulatory Visit: Payer: Self-pay | Admitting: *Deleted

## 2018-02-17 VITALS — BP 126/80 | HR 64 | Resp 18

## 2018-02-17 DIAGNOSIS — H10413 Chronic giant papillary conjunctivitis, bilateral: Secondary | ICD-10-CM | POA: Diagnosis not present

## 2018-02-17 DIAGNOSIS — H01021 Squamous blepharitis right upper eyelid: Secondary | ICD-10-CM | POA: Diagnosis not present

## 2018-02-17 DIAGNOSIS — H01022 Squamous blepharitis right lower eyelid: Secondary | ICD-10-CM | POA: Diagnosis not present

## 2018-02-17 DIAGNOSIS — H40013 Open angle with borderline findings, low risk, bilateral: Secondary | ICD-10-CM | POA: Diagnosis not present

## 2018-02-17 DIAGNOSIS — H01025 Squamous blepharitis left lower eyelid: Secondary | ICD-10-CM | POA: Diagnosis not present

## 2018-02-17 DIAGNOSIS — H01024 Squamous blepharitis left upper eyelid: Secondary | ICD-10-CM | POA: Diagnosis not present

## 2018-02-17 DIAGNOSIS — T782XXD Anaphylactic shock, unspecified, subsequent encounter: Secondary | ICD-10-CM | POA: Diagnosis not present

## 2018-02-17 DIAGNOSIS — J301 Allergic rhinitis due to pollen: Secondary | ICD-10-CM | POA: Diagnosis not present

## 2018-02-17 MED ORDER — EPINEPHRINE 0.3 MG/0.3ML IJ SOAJ: 0.3000 mg | Freq: Once | INTRAMUSCULAR | 2 refills | Status: AC

## 2018-02-17 MED FILL — EPINEPHRINE 0.3 MG AUTO-INJ: 0.3 | 30 days supply | Qty: 2 | Fill #0

## 2018-02-17 NOTE — Progress Notes (Signed)
Follow-up Note  RE: Rose Delgado MRN: [id number] DOB: 03/23/1961 Date of Office Visit: 02/17/2018   History of present illness: Rose Delgado is a 58 y.o. female presenting today for skin testing visit.  She was seen in the office on February 11, 2018 by Dr. Ernst Bowler for initial evaluation.  At that visit they were not able to do skin testing since she took her antihistamine the day of the visit.  She returns today off all antihistamines for the past week for testing today.    Medication List: Allergies as of 02/17/2018      Reactions   Metoprolol Anaphylaxis   Shellfish Allergy Anaphylaxis   Flagyl [metronidazole] Nausea And Vomiting   Lactose Intolerance (gi) Nausea And Vomiting   And diarrhea   Losartan Swelling   Topamax [topiramate] Other (See Comments)   "feeling of doom"   Ciprofloxacin Hcl Nausea Only   Diflucan [fluconazole] Nausea And Vomiting      Medication List        Accurate as of 02/17/18 12:57 PM. Always use your most recent med list.          azelastine 0.1 % nasal spray Commonly known as:  ASTELIN Place 2 sprays into both nostrils 2 (two) times daily. Use in each nostril as directed   BIOTIN 5000 5 MG Caps Generic drug:  Biotin Take 1 capsule by mouth daily.   cetirizine 10 MG tablet Commonly known as:  ZYRTEC Take 10 mg by mouth daily.   cholecalciferol 1000 units tablet Commonly known as:  VITAMIN D Take 1,000 Units by mouth daily.   cyclobenzaprine 10 MG tablet Commonly known as:  FLEXERIL Take 1 tablet by mouth every 8 (eight) hours as needed.   diltiazem 120 MG 24 hr capsule Commonly known as:  CARDIZEM CD Take 1 capsule (120 mg total) by mouth daily.   EPINEPHrine 0.3 mg/0.3 mL Soaj injection Commonly known as:  EPI-PEN Inject 0.3 mLs (0.3 mg total) into the muscle once for 1 dose.   fluticasone 50 MCG/ACT nasal spray Commonly known as:  FLONASE Place 1 spray into both nostrils daily.   hydrALAZINE 25 MG  tablet Commonly known as:  APRESOLINE Take 1 tablet (25 mg total) by mouth 2 (two) times daily.   Magnesium 250 MG Tabs Take 1 tablet by mouth daily.   MIRALAX PO Take 17 g by mouth daily.   multivitamin tablet Take 1 tablet by mouth daily.   ondansetron 4 MG tablet Commonly known as:  ZOFRAN Take 1 tablet (4 mg total) by mouth every 4 (four) hours as needed for nausea or vomiting.   oxyCODONE-acetaminophen 10-325 MG tablet Commonly known as:  PERCOCET Take 1 tablet by mouth every 4 (four) hours as needed for pain.   pantoprazole 20 MG tablet Commonly known as:  PROTONIX Take 20 mg by mouth daily.   rizatriptan 10 MG disintegrating tablet Commonly known as:  MAXALT-MLT Take 10 mg by mouth as needed for migraine. May repeat in 2 hours if needed   ROBAXIN 500 MG tablet Generic drug:  methocarbamol Take 500 mg by mouth 4 (four) times daily.   traMADol 50 MG tablet Commonly known as:  ULTRAM TAKE 1 TABLET BY MOUTH 1 2 TIMES A DAY   vitamin E 100 UNIT capsule Take 100 Units by mouth daily.       Known medication allergies: Allergies  Allergen Reactions  . Metoprolol Anaphylaxis  . Shellfish Allergy Anaphylaxis  . Flagyl [  Metronidazole] Nausea And Vomiting  . Lactose Intolerance (Gi) Nausea And Vomiting    And diarrhea  . Losartan Swelling  . Topamax [Topiramate] Other (See Comments)    "feeling of doom"  . Ciprofloxacin Hcl Nausea Only  . Diflucan [Fluconazole] Nausea And Vomiting     Physical examination (limited): Blood pressure 126/80, pulse 64, resp. rate 18, last menstrual period 07/29/2017, SpO2 98 %.  General: Alert, interactive, in no acute distress. Skin: Warm and dry, without lesions or rashes. Extremities:  No clubbing, cyanosis or edema.   Diagnositics/Labs:  Allergy testing: Environmental allergy skin prick testing is positive to short ragweed, pecan pollen, Sycamore, walnut pollen, Mucor plumbeus, Fusarium, pullulara, dust mites, cat,  cockroach.  Intradermal testing is negative. Skin prick testing to shellfish panel is positive to crab, lobster, oyster, scallops Allergy testing results were read and interpreted by provider, documented by clinical staff.   Assessment and plan:   1. Chronic rhinitis, allergic -Testing today was positive to weed and tree pollen as well as mold, dust mite, cat and cockroach.  Allergen avoidance measures discussed and provided -Zyrtec (cetirizine) 10mg  tablet once daily - Flonase (fluticasone) two sprays per nostril daily - Astelin (azelastine) 2 sprays per nostril 1-2 times daily as needed - You can use an extra dose of the antihistamine, if needed, for breakthrough symptoms.  - Consider nasal saline rinses 1-2 times daily to remove allergens from the nasal cavities as well as help with mucous clearance (this is especially helpful to do before the nasal sprays are given) - Consider allergy shots as a means of long-term control. - Allergy shots "re-train" and "reset" the immune system to ignore environmental allergens and decrease the resulting immune response to those allergens (sneezing, itchy watery eyes, runny nose, nasal congestion, etc).    - Allergy shots improve symptoms in 75-85% of patients.   2. Anaphylaxis to shellfish -Testing today is positive to shellfish.  She should avoid shellfish. -She was prescribed an EpiPen at her initial visit last week -Emergency action plan discussed and provided  3. Return in about 4-6 months  I appreciate the opportunity to take part in Rose Delgado's care. Please do not hesitate to contact me with questions.  Sincerely,   Prudy Feeler, MD Allergy/Immunology [facility]

## 2018-02-17 NOTE — Patient Instructions (Signed)
1. Chronic rhinitis, allergic -Testing today was positive to weed and tree pollen as well as mold, dust mite, cat and cockroach.  Allergen avoidance measures discussed and provided -Zyrtec (cetirizine) 10mg  tablet once daily - Flonase (fluticasone) two sprays per nostril daily - Astelin (azelastine) 2 sprays per nostril 1-2 times daily as needed - You can use an extra dose of the antihistamine, if needed, for breakthrough symptoms.  - Consider nasal saline rinses 1-2 times daily to remove allergens from the nasal cavities as well as help with mucous clearance (this is especially helpful to do before the nasal sprays are given) - Consider allergy shots as a means of long-term control. - Allergy shots "re-train" and "reset" the immune system to ignore environmental allergens and decrease the resulting immune response to those allergens (sneezing, itchy watery eyes, runny nose, nasal congestion, etc).    - Allergy shots improve symptoms in 75-85% of patients.   2. Anaphylaxis to shellfish -Testing today is positive to shellfish.  She should avoid shellfish. -She was prescribed an EpiPen at her initial visit last week -Emergency action plan discussed and provided  3. Return in about 4-6 months

## 2018-02-19 ENCOUNTER — Telehealth: Payer: Self-pay | Admitting: *Deleted

## 2018-02-19 NOTE — Telephone Encounter (Signed)
Patient called wondering why she was prescribed Astelin nasal spray when she takes Flonase. I reviewed the office note from 02/17/18 and informed her that Dr. Nelva Bush wants her to take Astelin nasal spray PRN along with her Flonase.

## 2018-02-27 MED FILL — hydrALAZINE HCL 25 MG TABS: 25 | 90 days supply | Qty: 180 | Fill #1

## 2018-02-27 MED FILL — CETIRIZINE HCL 10 MG TABS: 10 | 60 days supply | Qty: 60 | Fill #3

## 2018-02-27 MED FILL — RIZATRIPTAN BENZOATE 10 MG: 10 | 30 days supply | Qty: 9 | Fill #1

## 2018-02-27 MED FILL — SM COMPLETE 50+ TABLET: 125 days supply | Qty: 125 | Fill #2

## 2018-03-11 ENCOUNTER — Other Ambulatory Visit: Payer: Self-pay | Admitting: Allergy

## 2018-03-11 MED ORDER — AZELASTINE HCL 0.1 % NA SOLN: 2.0000 | Freq: Two times a day (BID) | NASAL | 1 refills | Status: DC

## 2018-03-11 MED FILL — AZELASTINE HCL 137 MCG/SPRA: 137 | 75 days supply | Qty: 90 | Fill #0

## 2018-03-11 MED FILL — FLUTICASONE PROP 50 MCG SPR: 50 | 90 days supply | Qty: 48 | Fill #0

## 2018-03-11 NOTE — Telephone Encounter (Signed)
90 day supply has been sent.

## 2018-03-11 NOTE — Telephone Encounter (Signed)
Patient was seen on 02-17-18. She said a prescription was called in for Astelin, for a 30 day supply. She is requesting a 90 day supply instead. [place].

## 2018-03-17 ENCOUNTER — Other Ambulatory Visit (HOSPITAL_COMMUNITY): Payer: Self-pay | Admitting: Family Medicine

## 2018-03-17 DIAGNOSIS — Z1231 Encounter for screening mammogram for malignant neoplasm of breast: Secondary | ICD-10-CM

## 2018-03-31 MED FILL — TERCONAZOLE 0.4% VAG CREAM: 0.4 | 7 days supply | Qty: 45 | Fill #0

## 2018-04-02 MED FILL — PANTOPRAZOLE SOD DR 40 MG T: 40 | 90 days supply | Qty: 90 | Fill #0

## 2018-04-02 MED FILL — CARTIA XT 120 MG CP24: 120 | 90 days supply | Qty: 90 | Fill #1

## 2018-04-21 MED FILL — CETIRIZINE HCL 10 MG TABS: 10 | 90 days supply | Qty: 90 | Fill #0

## 2018-04-25 ENCOUNTER — Ambulatory Visit (HOSPITAL_COMMUNITY): Payer: Self-pay

## 2018-04-28 ENCOUNTER — Ambulatory Visit (HOSPITAL_COMMUNITY): Payer: Self-pay

## 2018-05-08 ENCOUNTER — Encounter: Payer: Self-pay | Admitting: Physical Medicine & Rehabilitation

## 2018-05-08 ENCOUNTER — Encounter
Payer: PRIVATE HEALTH INSURANCE | Attending: Physical Medicine & Rehabilitation | Admitting: Physical Medicine & Rehabilitation

## 2018-05-08 VITALS — BP 142/65 | HR 87 | Ht 63.0 in | Wt 151.4 lb

## 2018-05-08 DIAGNOSIS — M79605 Pain in left leg: Secondary | ICD-10-CM | POA: Diagnosis present

## 2018-05-08 DIAGNOSIS — R202 Paresthesia of skin: Secondary | ICD-10-CM

## 2018-05-08 DIAGNOSIS — R200 Anesthesia of skin: Secondary | ICD-10-CM

## 2018-05-08 NOTE — Progress Notes (Signed)
Please see media tab for full results 

## 2018-05-15 ENCOUNTER — Ambulatory Visit (HOSPITAL_COMMUNITY)
Admission: RE | Admit: 2018-05-15 | Discharge: 2018-05-15 | Disposition: A | Discharge status: Home / Self Care | Payer: 59 | Source: Ambulatory Visit | Attending: Family Medicine | Admitting: Family Medicine

## 2018-05-15 DIAGNOSIS — Z1231 Encounter for screening mammogram for malignant neoplasm of breast: Secondary | ICD-10-CM | POA: Diagnosis not present

## 2018-05-30 MED FILL — hydrALAZINE HCL 25 MG TABS: 25 | 90 days supply | Qty: 180 | Fill #2

## 2018-06-04 ENCOUNTER — Other Ambulatory Visit: Payer: Self-pay | Admitting: Cardiovascular Disease

## 2018-06-04 ENCOUNTER — Other Ambulatory Visit (HOSPITAL_COMMUNITY)
Admission: RE | Admit: 2018-06-04 | Discharge: 2018-06-04 | Disposition: A | Discharge status: Home / Self Care | Payer: 59 | Source: Ambulatory Visit | Attending: Family Medicine | Admitting: Family Medicine

## 2018-06-04 DIAGNOSIS — E782 Mixed hyperlipidemia: Secondary | ICD-10-CM | POA: Diagnosis not present

## 2018-06-04 DIAGNOSIS — Z9189 Other specified personal risk factors, not elsewhere classified: Secondary | ICD-10-CM | POA: Insufficient documentation

## 2018-06-04 DIAGNOSIS — E559 Vitamin D deficiency, unspecified: Secondary | ICD-10-CM | POA: Insufficient documentation

## 2018-06-04 DIAGNOSIS — I10 Essential (primary) hypertension: Secondary | ICD-10-CM | POA: Diagnosis not present

## 2018-06-04 LAB — CBC
HCT: 42.2 % (ref 36.0–46.0)
Hemoglobin: 13.5 g/dL (ref 12.0–15.0)
MCH: 30.5 pg (ref 26.0–34.0)
MCHC: 32 g/dL (ref 30.0–36.0)
MCV: 95.3 fL (ref 80.0–100.0)
Platelets: 290 10*3/uL (ref 150–400)
RBC: 4.43 MIL/uL (ref 3.87–5.11)
RDW: 12.4 % (ref 11.5–15.5)
WBC: 6.6 10*3/uL (ref 4.0–10.5)
nRBC: 0 % (ref 0.0–0.2)

## 2018-06-04 LAB — COMPREHENSIVE METABOLIC PANEL
ALT: 49 U/L — ABNORMAL HIGH (ref 0–44)
AST: 31 U/L (ref 15–41)
Albumin: 4.5 g/dL (ref 3.5–5.0)
Alkaline Phosphatase: 89 U/L (ref 38–126)
Anion gap: 8 (ref 5–15)
BUN: 11 mg/dL (ref 6–20)
CO2: 26 mmol/L (ref 22–32)
Calcium: 9.9 mg/dL (ref 8.9–10.3)
Chloride: 105 mmol/L (ref 98–111)
Creatinine, Ser: 0.9 mg/dL (ref 0.44–1.00)
GFR calc Af Amer: >60 mL/min (ref >60)
GFR calc non Af Amer: >60 mL/min (ref >60)
Glucose, Bld: 119 mg/dL — ABNORMAL HIGH (ref 70–99)
Potassium: 4.1 mmol/L (ref 3.5–5.1)
Sodium: 139 mmol/L (ref 135–145)
Total Bilirubin: 0.6 mg/dL (ref 0.3–1.2)
Total Protein: 7.8 g/dL (ref 6.5–8.1)

## 2018-06-04 LAB — LIPID PANEL
Cholesterol: 228 mg/dL — ABNORMAL HIGH (ref 0–200)
HDL: 45 mg/dL (ref >40)
LDL Cholesterol: 153 mg/dL — ABNORMAL HIGH (ref 0–99)
Total CHOL/HDL Ratio: 5.1 RATIO
Triglycerides: 150 mg/dL — ABNORMAL HIGH (ref <150)
VLDL: 30 mg/dL (ref 0–40)

## 2018-06-04 LAB — TSH: TSH: 0.597 u[IU]/mL (ref 0.350–4.500)

## 2018-06-04 MED FILL — POLYETHYLENE GLYCOL 3350 PO: 30 days supply | Qty: 1054 | Fill #0

## 2018-06-04 MED FILL — VIT D2 1.25 MG (50,000 UNIT: 1.25 MG | 90 days supply | Qty: 26 | Fill #0

## 2018-06-04 MED FILL — SM CLEARLAX POWDER: 30 days supply | Qty: 1020 | Fill #0

## 2018-06-05 DIAGNOSIS — G43909 Migraine, unspecified, not intractable, without status migrainosus: Secondary | ICD-10-CM | POA: Diagnosis not present

## 2018-06-05 DIAGNOSIS — J301 Allergic rhinitis due to pollen: Secondary | ICD-10-CM | POA: Diagnosis not present

## 2018-06-05 DIAGNOSIS — E782 Mixed hyperlipidemia: Secondary | ICD-10-CM | POA: Diagnosis not present

## 2018-06-05 DIAGNOSIS — K219 Gastro-esophageal reflux disease without esophagitis: Secondary | ICD-10-CM | POA: Diagnosis not present

## 2018-06-05 DIAGNOSIS — E559 Vitamin D deficiency, unspecified: Secondary | ICD-10-CM | POA: Diagnosis not present

## 2018-06-05 DIAGNOSIS — I10 Essential (primary) hypertension: Secondary | ICD-10-CM | POA: Diagnosis not present

## 2018-06-30 MED FILL — CETIRIZINE HCL 10 MG TABS: 10 | 90 days supply | Qty: 90 | Fill #1

## 2018-06-30 MED FILL — CARTIA XT 120 MG CP24: 120 | 90 days supply | Qty: 90 | Fill #2

## 2018-06-30 MED FILL — SM COMPLETE 50+ TABLET: 125 days supply | Qty: 125 | Fill #0

## 2018-06-30 MED FILL — PANTOPRAZOLE SOD DR 40 MG T: 40 | 90 days supply | Qty: 90 | Fill #1

## 2018-07-29 ENCOUNTER — Telehealth: Payer: Self-pay | Admitting: Cardiovascular Disease

## 2018-07-29 DIAGNOSIS — I493 Ventricular premature depolarization: Secondary | ICD-10-CM

## 2018-07-29 NOTE — Telephone Encounter (Signed)
Pt came in stating she's having PVC's, they've been happening since Sunday. They are frequent but not sustained.  States she has recordings on her watch.

## 2018-07-29 NOTE — Telephone Encounter (Signed)
What have HR/BP been?

## 2018-07-29 NOTE — Telephone Encounter (Signed)
Will send to Dr. Lincoln Brigham to advise.

## 2018-07-30 MED ORDER — DILTIAZEM HCL ER COATED BEADS 180 MG PO CP24: 180.0000 mg | ORAL_CAPSULE | Freq: Every day | ORAL | 0 refills | Status: DC

## 2018-07-30 NOTE — Telephone Encounter (Signed)
Spoke with pt. Advised her of medication changes and to also have labs drawn. Pt voiced understanding.

## 2018-07-30 NOTE — Telephone Encounter (Signed)
Increase diltiazem to 180 mg daily

## 2018-07-30 NOTE — Telephone Encounter (Signed)
Pt did not have any specific BP's or HR  off top of her head. She came in office and checked it. bp 130/78 and hr was 80. Please advise.

## 2018-07-30 NOTE — Telephone Encounter (Signed)
Returned pt call. She stated that since Sunday she has been having more frequent PVC's. She stated around every 5th beat. She has them recorded on her watch. She stated that her heart HR and her BP have been fine.

## 2018-07-30 NOTE — Telephone Encounter (Signed)
Specifically what have HR/BP been? I'm wondering if the dose of diltiazem can be increased to perhaps 180 mg daily, but only if BP/HR tolerate and only if she is symptomatic.

## 2018-07-31 ENCOUNTER — Telehealth: Payer: Self-pay

## 2018-07-31 ENCOUNTER — Other Ambulatory Visit (HOSPITAL_COMMUNITY)
Admission: RE | Admit: 2018-07-31 | Discharge: 2018-07-31 | Disposition: A | Discharge status: Home / Self Care | Payer: 59 | Source: Ambulatory Visit | Attending: Cardiovascular Disease | Admitting: Cardiovascular Disease

## 2018-07-31 DIAGNOSIS — I493 Ventricular premature depolarization: Secondary | ICD-10-CM | POA: Insufficient documentation

## 2018-07-31 LAB — TSH: TSH: 0.702 u[IU]/mL (ref 0.350–4.500)

## 2018-07-31 LAB — POTASSIUM: Potassium: 3.9 mmol/L (ref 3.5–5.1)

## 2018-07-31 LAB — MAGNESIUM: Magnesium: 2.1 mg/dL (ref 1.7–2.4)

## 2018-07-31 NOTE — Telephone Encounter (Signed)
Pt notified. Voiced understanding. Copy to pcp.  

## 2018-07-31 NOTE — Telephone Encounter (Signed)
-----   Message from Herminio Commons, MD sent at 07/31/2018  9:33 AM EST ----- K and Mg normal.

## 2018-08-06 ENCOUNTER — Encounter: Payer: Self-pay | Admitting: Cardiovascular Disease

## 2018-08-06 ENCOUNTER — Ambulatory Visit: Payer: 59 | Admitting: Cardiovascular Disease

## 2018-08-06 VITALS — BP 131/78 | HR 68 | Ht 63.0 in | Wt 148.2 lb

## 2018-08-06 DIAGNOSIS — I493 Ventricular premature depolarization: Secondary | ICD-10-CM

## 2018-08-06 DIAGNOSIS — R002 Palpitations: Secondary | ICD-10-CM

## 2018-08-06 DIAGNOSIS — I10 Essential (primary) hypertension: Secondary | ICD-10-CM | POA: Diagnosis not present

## 2018-08-06 NOTE — Patient Instructions (Signed)

## 2018-08-06 NOTE — Progress Notes (Signed)
SUBJECTIVE: The patient presents for follow-up of palpitations.  I recently checked potassium and magnesium and both were normal.  She had called complaining of increased palpitation frequency.  She came to our office and had vital signs checked: Blood pressure 130/78, heart rate 80.   I increased diltiazem to 180 mg daily on 07/30/2018.  ECG performed in the office today which I ordered and personally interpreted demonstrates normal sinus rhythm with isolated PVC and nonspecific ST segment abnormalities.  She had been experiencing some shortness of breath when climbing her basement stairs after doing the laundry but this has gradually been improving with the higher dose of diltiazem.  She denies chest pain, leg swelling, orthopnea, and dizziness.  She monitors her heart rate with her watch and the app on her phone provides an ECG.  She would like to begin exercising and has not done so for the past month.  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     Review of Systems: As per "subjective", otherwise negative.  Allergies  Allergen Reactions  . Metoprolol Anaphylaxis  . Shellfish Allergy Anaphylaxis  . Flagyl [Metronidazole] Nausea And Vomiting  . Lactose Intolerance (Gi) Nausea And Vomiting    And diarrhea  . Losartan Swelling  . Topamax [Topiramate] Other (See Comments)    "feeling of doom"  . Ciprofloxacin Hcl Nausea Only  . Diflucan [Fluconazole] Nausea And Vomiting    Current Outpatient Medications  Medication Sig Dispense Refill  . azelastine (ASTELIN) 0.1 % nasal spray Place 2 sprays into both nostrils 2 (two) times daily. Use in each nostril as directed 90 mL 1  . Biotin (BIOTIN 5000) 5 MG CAPS Take 1 capsule by mouth daily.    . cetirizine (ZYRTEC) 10 MG tablet Take 10 mg by mouth daily.    . cholecalciferol (VITAMIN D) 1000 units tablet Take 1,000 Units by mouth daily.    . cyclobenzaprine (FLEXERIL) 10 MG tablet Take 1 tablet by mouth every 8 (eight)  hours as needed.  0  . diltiazem (CARDIZEM CD) 180 MG 24 hr capsule Take 1 capsule (180 mg total) by mouth daily. 30 capsule 0  . fluticasone (FLONASE) 50 MCG/ACT nasal spray Place 1 spray into both nostrils daily.     . hydrALAZINE (APRESOLINE) 25 MG tablet Take 1 tablet (25 mg total) by mouth 2 (two) times daily. 180 tablet 3  . Magnesium 250 MG TABS Take 1 tablet by mouth daily.    . methocarbamol (ROBAXIN) 500 MG tablet Take 500 mg by mouth 4 (four) times daily.    . Multiple Vitamins-Minerals (SM COMPLETE 50+) TABS Take 1 tablet by mouth daily.    . ondansetron (ZOFRAN) 4 MG tablet Take 1 tablet (4 mg total) by mouth every 4 (four) hours as needed for nausea or vomiting. 30 tablet 2  . pantoprazole (PROTONIX) 40 MG tablet Take 1 tablet by mouth daily.    . Polyethylene Glycol 3350 (MIRALAX PO) Take 17 g by mouth daily.    . rizatriptan (MAXALT-MLT) 10 MG disintegrating tablet Take 10 mg by mouth as needed for migraine. May repeat in 2 hours if needed    . Vitamin D, Ergocalciferol, (DRISDOL) 1.25 MG (50000 UT) CAPS capsule Take 1 capsule by mouth once a week.    . vitamin E 100 UNIT capsule Take 100 Units by mouth daily.     No current facility-administered medications for this visit.     Past Medical History:  Diagnosis  Date  . Allergic rhinitis   . Angio-edema   . Fibroids   . Irregular heart beat   . Migraines   . Perimenopause 03/09/2014  . Reflux   . Urticaria     Past Surgical History:  Procedure Laterality Date  . ADENOIDECTOMY    . BREAST BIOPSY Left 2008   benign  . COLONOSCOPY  06/2011   Dr. Britta Mccreedy: per patient it was normal, follow up in 10 years.   . ESOPHAGOGASTRODUODENOSCOPY     Dr. Berneta Sages: late 1990s/early 2000  . ESOPHAGOGASTRODUODENOSCOPY N/A 05/19/2014   SLF:NO OBVIOUS SOURCE FOR DYSGEUSIA IDENTIFED/MILD Non-erosive gastritis  . INCISION AND DRAINAGE Right 07/31/2017   Procedure: INCISION AND DRAINAGE RIGHT THUMB NAIL BED;  Surgeon: Charlotte Crumb,  MD;  Location: [facility];  Service: Orthopedics;  Laterality: Right;  . INGUINAL HERNIA REPAIR    . KNEE SURGERY Right   . NAILBED REPAIR Right 07/31/2017   Procedure: NAILBED BIOPSY;  Surgeon: Charlotte Crumb, MD;  Location: [facility];  Service: Orthopedics;  Laterality: Right;  . TONSILLECTOMY    . TONSILLECTOMY AND ADENOIDECTOMY      Social History   Socioeconomic History  . Marital status: Single    Spouse name: Not on file  . Number of children: Not on file  . Years of education: Not on file  . Highest education level: Not on file  Occupational History  . Occupation: Therapist, sports at [employer]: [employer]  . Financial resource strain: Not on file  . Food insecurity:    Worry: Not on file    Inability: Not on file  . Transportation needs:    Medical: Not on file    Non-medical: Not on file  Tobacco Use  . Smoking status: Never Smoker  . Smokeless tobacco: Never Used  Substance and Sexual Activity  . Alcohol use: No  . Drug use: No  . Sexual activity: Not Currently    Birth control/protection: None  Lifestyle  . Physical activity:    Days per week: Not on file    Minutes per session: Not on file  . Stress: Not on file  Relationships  . Social connections:    Talks on phone: Not on file    Gets together: Not on file    Attends religious service: Not on file    Active member of club or organization: Not on file    Attends meetings of clubs or organizations: Not on file    Relationship status: Not on file  . Intimate partner violence:    Fear of current or ex partner: Not on file    Emotionally abused: Not on file    Physically abused: Not on file    Forced sexual activity: Not on file  Other Topics Concern  . Not on file  Social History Narrative  . Not on file     Vitals:   08/06/18 0900  BP: 131/78  Pulse: 68  SpO2: 99%  Weight: 148 lb 3.2 oz (67.2 kg)  Height: 5\' 3"  (1.6 m)    Wt Readings from Last  3 Encounters:  08/06/18 148 lb 3.2 oz (67.2 kg)  05/08/18 151 lb 6.4 oz (68.7 kg)  02/11/18 144 lb 3.2 oz (65.4 kg)     PHYSICAL EXAM General: NAD HEENT: Normal. Neck: No JVD, no thyromegaly. Lungs: Clear to auscultation bilaterally with normal respiratory effort. CV: Regular rate and rhythm, normal S1/S2, no S3/S4, no  murmur. No pretibial or periankle edema.  No carotid bruit.   Abdomen: Soft, nontender, no distention.  Neurologic: Alert and oriented.  Psych: Normal affect. Skin: Normal. Musculoskeletal: No gross deformities.    ECG: Reviewed above under Subjective   Labs: Lab Results  Component Value Date/Time   K 3.9 07/31/2018 09:02 AM   BUN 11 06/04/2018 10:20 AM   CREATININE 0.90 06/04/2018 10:20 AM   CREATININE 0.92 01/01/2017 07:56 AM   ALT 49 (H) 06/04/2018 10:20 AM   TSH 0.702 07/31/2018 09:02 AM   HGB 13.5 06/04/2018 10:20 AM   HGB 11.2 05/28/2016 02:32 PM     Lipids: Lab Results  Component Value Date/Time   LDLCALC 153 (H) 06/04/2018 10:20 AM   CHOL 228 (H) 06/04/2018 10:20 AM   TRIG 150 (H) 06/04/2018 10:20 AM   HDL 45 06/04/2018 10:20 AM       ASSESSMENT AND PLAN: 1.  Palpitations/frequent PVCs: Diltiazem recently increased to 180 mg daily.  Symptomatically improved.  Potassium, TSH and magnesium are normal.  No changes to therapy.  She will continue to monitor symptoms and heart rate with watch and phone app.  2.  Hypertension: Blood pressure is normal.  Continue long-acting diltiazem and hydralazine 25 mg twice daily.    Disposition: Follow up 6 months   Kate Sable, M.D., F.A.C.C.

## 2018-08-13 ENCOUNTER — Ambulatory Visit: Payer: 59 | Admitting: Allergy & Immunology

## 2018-08-18 ENCOUNTER — Other Ambulatory Visit: Payer: Self-pay | Admitting: Cardiovascular Disease

## 2018-08-18 MED ORDER — HYDRALAZINE HCL 25 MG PO TABS: 25.0000 mg | ORAL_TABLET | Freq: Two times a day (BID) | ORAL | 4 refills | Status: DC

## 2018-08-18 MED ORDER — DILTIAZEM HCL ER COATED BEADS 180 MG PO CP24: 180.0000 mg | ORAL_CAPSULE | Freq: Every day | ORAL | 4 refills | Status: DC

## 2018-08-18 NOTE — Telephone Encounter (Signed)
diltiazem (CARDIZEM CD) 180 MG 24 hr capsule [[id number]]   hydrALAZINE (APRESOLINE) 25 MG tablet [[id number]] ENDED   Both need to be sent to [facility] out pt for 90 days

## 2018-08-18 NOTE — Telephone Encounter (Signed)
Refilled 90 days to [facility] pharmacy

## 2018-08-21 ENCOUNTER — Ambulatory Visit: Payer: 59 | Admitting: Allergy

## 2018-08-21 DIAGNOSIS — H5213 Myopia, bilateral: Secondary | ICD-10-CM | POA: Diagnosis not present

## 2018-08-21 DIAGNOSIS — H0102B Squamous blepharitis left eye, upper and lower eyelids: Secondary | ICD-10-CM | POA: Diagnosis not present

## 2018-08-21 DIAGNOSIS — H0102A Squamous blepharitis right eye, upper and lower eyelids: Secondary | ICD-10-CM | POA: Diagnosis not present

## 2018-08-21 DIAGNOSIS — H2513 Age-related nuclear cataract, bilateral: Secondary | ICD-10-CM | POA: Diagnosis not present

## 2018-08-21 DIAGNOSIS — H40023 Open angle with borderline findings, high risk, bilateral: Secondary | ICD-10-CM | POA: Diagnosis not present

## 2018-08-21 DIAGNOSIS — H524 Presbyopia: Secondary | ICD-10-CM | POA: Diagnosis not present

## 2018-08-22 MED FILL — hydrALAZINE HCL 25 MG TABS: 25 | 90 days supply | Qty: 180 | Fill #3

## 2018-08-25 MED FILL — CARTIA XT 180 MG CAPSULE SA: 180 | 90 days supply | Qty: 90 | Fill #0 | Status: TO

## 2018-08-27 ENCOUNTER — Ambulatory Visit: Payer: 59 | Admitting: Allergy & Immunology

## 2018-08-27 ENCOUNTER — Encounter: Payer: Self-pay | Admitting: Allergy & Immunology

## 2018-08-27 VITALS — BP 128/80 | HR 86 | Resp 14

## 2018-08-27 DIAGNOSIS — T7800XD Anaphylactic reaction due to unspecified food, subsequent encounter: Secondary | ICD-10-CM

## 2018-08-27 DIAGNOSIS — J3089 Other allergic rhinitis: Secondary | ICD-10-CM

## 2018-08-27 DIAGNOSIS — J302 Other seasonal allergic rhinitis: Secondary | ICD-10-CM | POA: Diagnosis not present

## 2018-08-27 DIAGNOSIS — T7800XA Anaphylactic reaction due to unspecified food, initial encounter: Secondary | ICD-10-CM | POA: Insufficient documentation

## 2018-08-27 MED ORDER — AZELASTINE HCL 0.1 % NA SOLN: 2.0000 | Freq: Two times a day (BID) | NASAL | 3 refills | Status: DC

## 2018-08-27 MED ORDER — FLUTICASONE PROPIONATE 50 MCG/ACT NA SUSP: 1.0000 | Freq: Every day | NASAL | 3 refills | Status: DC

## 2018-08-27 MED ORDER — CETIRIZINE HCL 10 MG PO TABS: 10.0000 mg | ORAL_TABLET | Freq: Every day | ORAL | 5 refills | Status: DC

## 2018-08-27 MED ORDER — MONTELUKAST SODIUM 10 MG PO TABS: 10.0000 mg | ORAL_TABLET | Freq: Every day | ORAL | 5 refills | Status: DC

## 2018-08-27 MED FILL — MONTELUKAST SOD 10 MG TAB: 10 | 30 days supply | Qty: 30 | Fill #0

## 2018-08-27 MED FILL — FLUTICASONE PROP 50 MCG SPR: 50 | 60 days supply | Qty: 16 | Fill #0

## 2018-08-27 MED FILL — AZELASTINE HCL 137 MCG SPRY: 0.1 | 75 days supply | Qty: 90 | Fill #0

## 2018-08-27 NOTE — Progress Notes (Signed)
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 Rose Delgado presents for follow-up visit.  We did maximize her nasal sprays at the last visit and added on cetirizine.  Despite this, she continues to have problems.  She is only using her nose sprays once a day, so we can increase that to twice daily to see if this provides any improvement.  We are also going to add on Singulair to see if this might help provide some improvement to her allergy symptoms.  We did discuss allergy shots as a means of long-term control.  She has actually undergone 5 years of allergy shots when she lived in [place], [place].  While this did provide some relief, her symptoms clearly continue to be poorly controlled.  She is currently open to restarting allergy shots, but would like to get the Singulair some time to work first.  Plan/Recommendations:   1. Chronic rhinitis (trees, weeds, molds, dust mites, cat, cockroach) - It seems that we are only partially controlling your symptoms. - Continue with: Zyrtec (cetirizine) 10mg  tablet once daily, Flonase (fluticasone) two sprays per nostril up to twice daily and Astelin (azelastine) 2 sprays per nostril up to twice daily as needed - Add on: Singulair (montelukast) 10mg  daily. - You can use an extra dose of the antihistamine, if needed, for breakthrough symptoms (we will send in the script for the cetirizine twice daily).  - Consider nasal saline rinses 1-2 times daily to remove allergens from the nasal cavities as well as help with mucous clearance (this is especially helpful to do before the nasal sprays are given) - Consider allergy shots as a means of long-term control.  2. 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   - Call me and let me know how it goes.   3. Return in about 6 months (around 02/25/2019).  Subjective:   Rose Delgado is a 58 y.o. female presenting today for follow up of  Chief Complaint  Patient presents with  . Allergic Rhinitis     Rose Delgado has a history of the following: Patient Active Problem List   Diagnosis Date Noted  . Seasonal and perennial allergic rhinitis 08/27/2018  . 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  . Menorrhagia 05/28/2016  . Nausea with vomiting 07/14/2014  . Dysgeusia 03/11/2014  . GERD (gastroesophageal reflux disease) 03/11/2014  . Hot flashes 03/09/2014  . Perimenopause 03/09/2014  . Fibroids 03/09/2014    History obtained from: chart review and patient.  Rose Delgado is a 58 y.o. female presenting for a follow up visit.  She was last seen in August 2019 for skin testing visit with Dr. Nelva Bush.  At that time, she was positive to ragweed, pecan pollen, sycamore, walnut pollen, Mucor, Fusarium, pullulara, dust mite, cat, and cockroach.  Intradermal testing was negative.  She had skin testing that was also positive to shellfish mix, crab, lobster, oyster.  At that time, she was continued on Zyrtec Flonase, and Astelin.   Allergic Rhinitis Symptom History: She reports that she continues to have problems with sneezing. She also has some postnasal drip. She takes her medications in the morning. She also takes her cetirizine in the morning. She has not needed antibiotics since the last  time.  She does not feel that her symptoms are in as good control as they could be.  While she is interested in allergy shots, she is unsure of how much they will cost.  She is interested in getting the CPT codes today.  Food Allergy Symptom History: She continues to avoid all shellfish and mollusks.  She is interested in introducing shrimp since testing was negative.  She is wondering how I feel about this, but she tells me that Dr. Nelva Bush recommended  avoiding all shellfish.  She does have an EpiPen.  Otherwise, there have been no changes to her past medical history, surgical history, family history, or social history.    Review of Systems  Constitutional: Negative.  Negative for fever, malaise/fatigue and weight loss.  HENT: Positive for congestion and sore throat. Negative for ear discharge and ear pain.        Positive for postnasal drip.  Positive for throat clearing.  Eyes: Negative for pain, discharge and redness.  Respiratory: Negative for cough, sputum production, shortness of breath and wheezing.   Cardiovascular: Negative.  Negative for chest pain and palpitations.  Gastrointestinal: Negative for abdominal pain and heartburn.  Skin: Negative.  Negative for itching and rash.  Neurological: Negative for dizziness and headaches.  Endo/Heme/Allergies: Negative for environmental allergies. Does not bruise/bleed easily.       Objective:   Blood pressure 128/80, pulse 86, resp. rate 14, last menstrual period 07/29/2017, SpO2 97 %. There is no height or weight on file to calculate BMI.   Physical Exam:  Physical Exam  Constitutional: She appears well-developed.  HENT:  Head: Normocephalic and atraumatic.  Right Ear: Tympanic membrane, external ear and ear canal normal.  Left Ear: Tympanic membrane and ear canal normal.  Nose: No mucosal edema, rhinorrhea, nasal deformity or septal deviation. No epistaxis. Right sinus exhibits no maxillary sinus tenderness and no frontal sinus tenderness. Left sinus exhibits no maxillary sinus tenderness and no frontal sinus tenderness.  Mouth/Throat: Uvula is midline and oropharynx is clear and moist. Mucous membranes are not pale and not dry. No oropharyngeal exudate, posterior oropharyngeal edema or posterior oropharyngeal erythema.  Severe cobblestoning in the posterior oropharynx.  Eyes: Pupils are equal, round, and reactive to light. Conjunctivae and EOM are normal. Right eye exhibits  no chemosis and no discharge. Left eye exhibits no chemosis and no discharge. Right conjunctiva is not injected. Left conjunctiva is not injected.  Cardiovascular: Normal rate, regular rhythm and normal heart sounds.  Respiratory: Effort normal and breath sounds normal. No accessory muscle usage. No tachypnea. No respiratory distress. She has no wheezes. She has no rhonchi. She has no rales. She exhibits no tenderness.  Lymphadenopathy:    She has no cervical adenopathy.  Neurological: She is alert.  Skin: No abrasion, no petechiae and no rash noted. Rash is not papular, not vesicular and not urticarial. No erythema. No pallor.  Psychiatric: She has a normal mood and affect.     Diagnostic studies: none    Salvatore Marvel, MD  [facility]

## 2018-08-27 NOTE — Patient Instructions (Addendum)
1. Chronic rhinitis (trees, weeds, molds, dust mites, cat, cockroach) - It seems that we are only partially controlling your symptoms. - Continue with: Zyrtec (cetirizine) 10mg  tablet once daily, Flonase (fluticasone) two sprays per nostril up to twice daily and Astelin (azelastine) 2 sprays per nostril up to twice daily as needed - Add on: Singulair (montelukast) 10mg  daily. - You can use an extra dose of the antihistamine, if needed, for breakthrough symptoms (we will send in the script for the cetirizine twice daily).  - Consider nasal saline rinses 1-2 times daily to remove allergens from the nasal cavities as well as help with mucous clearance (this is especially helpful to do before the nasal sprays are given) - Consider allergy shots as a means of long-term control.  2. 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  - Call me and let me know how it goes.   3. Return in about 6 months (around 02/25/2019).   Please inform us of any Emergency Department visits, hospitalizations, or changes in symptoms. Call us before going to the ED for breathing or allergy symptoms since we might be able to fit you in for a sick visit. Feel free to contact us anytime with any questions, problems, or concerns.  It was a pleasure to see you again today!  Websites that have reliable patient information: 1. American Academy of Asthma, Allergy, and Immunology: www.aaaai.org 2. Food Allergy Research and Education (FARE): foodallergy.org 3. Mothers of Asthmatics: http://www.asthmacommunitynetwork.org 4. American College of Allergy, Asthma, and Immunology: MonthlyElectricBill.co.uk   Make sure you are registered to vote! If you have moved or changed any of your contact information, you will need to get this updated before voting!

## 2018-08-29 MED FILL — VIT D2 1.25 MG (50,000 UNIT: 1.25 MG | 90 days supply | Qty: 26 | Fill #1

## 2018-09-01 ENCOUNTER — Telehealth: Payer: Self-pay

## 2018-09-01 MED ORDER — FLUTICASONE PROPIONATE 50 MCG/ACT NA SUSP: 2.0000 | Freq: Two times a day (BID) | NASAL | 3 refills | Status: DC

## 2018-09-01 NOTE — Telephone Encounter (Signed)
New prescription sent in to replace previous fluticasone prescription due to wrong instructions on it. Patient informed of the change.

## 2018-09-01 NOTE — Telephone Encounter (Signed)
Patient called stating her Flonase was sent in incorrectly. She states Dr Ernst Bowler increased it to 2x a day but it was sent in for 1x a day. Patient states the pharmacy called her and told her it could be altered some kind of way.  Please Advise

## 2018-09-05 ENCOUNTER — Telehealth: Payer: Self-pay | Admitting: Allergy & Immunology

## 2018-09-05 MED ORDER — MONTELUKAST SODIUM 10 MG PO TABS: 10.0000 mg | ORAL_TABLET | Freq: Every day | ORAL | 1 refills | Status: DC

## 2018-09-05 MED ORDER — CETIRIZINE HCL 10 MG PO TABS: 10.0000 mg | ORAL_TABLET | Freq: Two times a day (BID) | ORAL | 1 refills | Status: DC

## 2018-09-05 MED ORDER — FLUTICASONE PROPIONATE 50 MCG/ACT NA SUSP: 2.0000 | Freq: Two times a day (BID) | NASAL | 1 refills | Status: DC

## 2018-09-05 NOTE — Telephone Encounter (Signed)
All medications were sent in to pharmacy the day of her visit. Patient is requesting a 90 day supply. Rx with 90 day supply have been sent in and patient is made aware.

## 2018-09-05 NOTE — Telephone Encounter (Signed)
Patient was seen 08-27-2018, in [place]. She was told prescriptions for Zyrtec, Flonase, and Astelin would be sent in to [facility]. He was going to increase dosage on them. She said she keeps calling the pharmacy, and they have not been sent in.

## 2018-09-22 MED FILL — FLUTICASONE PROP 50 MCG SPR: 50 | 44 days supply | Qty: 48 | Fill #0

## 2018-09-22 MED FILL — MONTELUKAST SOD 10 MG TAB: 10 | 90 days supply | Qty: 90 | Fill #0

## 2018-09-23 MED FILL — PANTOPRAZOLE SOD DR 40 MG T: 40 | 90 days supply | Qty: 90 | Fill #0

## 2018-10-01 MED FILL — CETIRIZINE HCL 10 MG TABS: 10 | 90 days supply | Qty: 180 | Fill #0

## 2018-10-27 MED FILL — SM CLEARLAX POWDER: 17 | 30 days supply | Qty: 1020 | Fill #0

## 2018-10-30 DIAGNOSIS — Z6824 Body mass index (BMI) 24.0-24.9, adult: Secondary | ICD-10-CM | POA: Diagnosis not present

## 2018-10-30 DIAGNOSIS — R05 Cough: Secondary | ICD-10-CM | POA: Diagnosis not present

## 2018-10-30 MED FILL — FLUTICASONE PROP 50 MCG SPR: 50 | 30 days supply | Qty: 16 | Fill #0

## 2018-11-11 MED FILL — DILTIAZEM 24HR ER 180 MG CA: 180 | 30 days supply | Qty: 30 | Fill #0

## 2018-11-11 MED FILL — hydrALAZINE HCL 25 MG TABS: 25 | 90 days supply | Qty: 180 | Fill #0

## 2018-11-21 ENCOUNTER — Telehealth: Payer: Self-pay | Admitting: Cardiovascular Disease

## 2018-11-21 NOTE — Telephone Encounter (Signed)
Patient c/o palpitations the last 2 days.  Stated she does take her Cardizem CD 180mg  daily.  Does take at the same time every day.  No c/o chest pain, dizziness.  Does not really feel SOB, but does feel like breath feels different during these episodes.  States she may be a little more stressed lately.  States that she still has some old Diltiazem 30mg  from before and questions if she can take this as needed.

## 2018-11-21 NOTE — Telephone Encounter (Signed)
Patient called stating that she has been experiencing frequent PVC's since yesterday afternoon.  More this morning

## 2018-11-21 NOTE — Telephone Encounter (Signed)
Patient notified and verbalized understanding. 

## 2018-11-21 NOTE — Telephone Encounter (Signed)
Yes, can take her diltiazem 30mg  up to 3 times in a day as needed. Try this over the holiday weekend and update [place] on Tuesday. We may continue dilt as needed or consider increasing her long acting dose pending how she does   Zandra Abts MD

## 2018-11-28 ENCOUNTER — Other Ambulatory Visit: Payer: Self-pay | Admitting: Cardiovascular Disease

## 2018-11-28 MED ORDER — DILTIAZEM HCL ER COATED BEADS 180 MG PO CP24: 180.0000 mg | ORAL_CAPSULE | Freq: Every day | ORAL | 4 refills | Status: DC

## 2018-11-28 NOTE — Telephone Encounter (Signed)
Done

## 2018-11-28 NOTE — Telephone Encounter (Signed)
Patient called about Diltiazem medication . States that she took medication and it seems to be working. Wanted to check with Dr. Bronson Ing about calling in refill.  She is requesting 90 day supply to [facility]

## 2018-12-04 ENCOUNTER — Other Ambulatory Visit: Payer: Self-pay | Admitting: Adult Health

## 2018-12-04 ENCOUNTER — Encounter: Payer: Self-pay | Admitting: Cardiovascular Disease

## 2018-12-04 DIAGNOSIS — G43909 Migraine, unspecified, not intractable, without status migrainosus: Secondary | ICD-10-CM | POA: Diagnosis not present

## 2018-12-04 DIAGNOSIS — Z6824 Body mass index (BMI) 24.0-24.9, adult: Secondary | ICD-10-CM | POA: Diagnosis not present

## 2018-12-04 DIAGNOSIS — I10 Essential (primary) hypertension: Secondary | ICD-10-CM | POA: Diagnosis not present

## 2018-12-04 DIAGNOSIS — J301 Allergic rhinitis due to pollen: Secondary | ICD-10-CM | POA: Diagnosis not present

## 2018-12-04 DIAGNOSIS — Z23 Encounter for immunization: Secondary | ICD-10-CM | POA: Diagnosis not present

## 2018-12-04 DIAGNOSIS — Z1212 Encounter for screening for malignant neoplasm of rectum: Secondary | ICD-10-CM | POA: Diagnosis not present

## 2018-12-04 DIAGNOSIS — K219 Gastro-esophageal reflux disease without esophagitis: Secondary | ICD-10-CM | POA: Diagnosis not present

## 2018-12-04 DIAGNOSIS — Z1389 Encounter for screening for other disorder: Secondary | ICD-10-CM | POA: Diagnosis not present

## 2018-12-04 DIAGNOSIS — E782 Mixed hyperlipidemia: Secondary | ICD-10-CM | POA: Diagnosis not present

## 2018-12-04 DIAGNOSIS — E559 Vitamin D deficiency, unspecified: Secondary | ICD-10-CM | POA: Diagnosis not present

## 2018-12-04 DIAGNOSIS — Z9189 Other specified personal risk factors, not elsewhere classified: Secondary | ICD-10-CM | POA: Diagnosis not present

## 2018-12-04 DIAGNOSIS — Z0001 Encounter for general adult medical examination with abnormal findings: Secondary | ICD-10-CM | POA: Diagnosis not present

## 2018-12-04 MED FILL — FLUTICASONE PROP 50 MCG SPR: 50 | 90 days supply | Qty: 48 | Fill #0

## 2018-12-04 MED FILL — VIT D2 1.25 MG (50,000 UNIT: 1.25 MG | 84 days supply | Qty: 24 | Fill #0

## 2018-12-05 MED FILL — CARTIA XT 180 MG CAPSULE SA: 180 | 90 days supply | Qty: 90 | Fill #1

## 2018-12-05 NOTE — Telephone Encounter (Signed)
Please give pt a call --had some problems w/ her medications, would like to discuss them w/ the nurse. Can be reached @ [phone]

## 2018-12-05 NOTE — Telephone Encounter (Signed)
She is going to call the [facility] as she got a different manufacturer

## 2018-12-17 MED FILL — MONTELUKAST SOD 10 MG TAB: 10 | 90 days supply | Qty: 90 | Fill #1

## 2018-12-25 ENCOUNTER — Telehealth: Payer: Self-pay | Admitting: Cardiovascular Disease

## 2018-12-25 NOTE — Telephone Encounter (Signed)
Pt states wearing a face mask causes her to be SOB, retain Co2 and have chest pain.She has not checked SaO2 and it is "doing something to her heart"

## 2018-12-25 NOTE — Telephone Encounter (Signed)
Patient would like to speak with nurse regarding symptoms. / tg

## 2018-12-26 NOTE — Telephone Encounter (Signed)
Patient informed of Dr.Koneswaran's message

## 2018-12-26 NOTE — Telephone Encounter (Signed)
Would monitor vitals. Would be unlikely to cause a cardiac issue. Can always get an ECG in ED while working.

## 2018-12-27 DIAGNOSIS — Z7689 Persons encountering health services in other specified circumstances: Secondary | ICD-10-CM | POA: Diagnosis not present

## 2019-01-05 MED FILL — PANTOPRAZOLE SOD DR 40 MG T: 40 | 90 days supply | Qty: 90 | Fill #1

## 2019-01-06 MED FILL — CETIRIZINE HCL 10 MG TABS: 10 | 90 days supply | Qty: 180 | Fill #1

## 2019-01-07 ENCOUNTER — Other Ambulatory Visit (HOSPITAL_COMMUNITY)
Admission: RE | Admit: 2019-01-07 | Discharge: 2019-01-07 | Disposition: A | Discharge status: Home / Self Care | Payer: 59 | Source: Ambulatory Visit | Attending: Adult Health | Admitting: Adult Health

## 2019-01-07 ENCOUNTER — Encounter: Payer: Self-pay | Admitting: Adult Health

## 2019-01-07 ENCOUNTER — Other Ambulatory Visit: Payer: Self-pay

## 2019-01-07 ENCOUNTER — Ambulatory Visit (INDEPENDENT_AMBULATORY_CARE_PROVIDER_SITE_OTHER): Payer: 59 | Admitting: Adult Health

## 2019-01-07 VITALS — BP 150/77 | HR 72 | Ht 63.0 in | Wt 146.0 lb

## 2019-01-07 DIAGNOSIS — Z1212 Encounter for screening for malignant neoplasm of rectum: Secondary | ICD-10-CM

## 2019-01-07 DIAGNOSIS — N950 Postmenopausal bleeding: Secondary | ICD-10-CM

## 2019-01-07 DIAGNOSIS — Z1211 Encounter for screening for malignant neoplasm of colon: Secondary | ICD-10-CM

## 2019-01-07 DIAGNOSIS — Z01419 Encounter for gynecological examination (general) (routine) without abnormal findings: Secondary | ICD-10-CM | POA: Insufficient documentation

## 2019-01-07 LAB — HEMOCCULT GUIAC POC 1CARD (OFFICE): Fecal Occult Blood, POC: NEGATIVE

## 2019-01-07 NOTE — Addendum Note (Signed)
Addended by: Octaviano Glow on: 01/07/2019 03:47 PM   Modules accepted: Orders

## 2019-01-07 NOTE — Progress Notes (Signed)
Patient ID: Rose Delgado, female   DOB: [birth year], 58 y.o.   MRN: [id number] History of Present Illness: Rose Delgado is a 58 year old black female, single, PM, in for a well woman gyn exam and pap. She has had 2 episodes of vaginal bleeding like a period, in last year or so. 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 And she says she tested + for antibodies.  PCP is Dr Rose Delgado.   Current Medications, Allergies, Past Medical History, Past Surgical History, Family History and Social History were reviewed in [employer] record.     Review of Systems: Patient denies any headaches, hearing loss, fatigue, blurred vision, shortness of breath, chest pain, abdominal pain, problems with bowel movements, urination, or intercourse. No joint pain or mood swings. +vaginal bleeding x 2    Physical Exam:BP (!) 150/77 (BP Location: Right Arm, Patient Position: Sitting, Cuff Size: Normal)   Pulse 72   Ht 5\' 3"  (1.6 m)   Wt 146 lb (66.2 kg)   LMP 07/29/2017   BMI 25.86 kg/m  General:  Well developed, well nourished, no acute distress Skin:  Warm and dry Neck:  Midline trachea, normal thyroid, good ROM, no lymphadenopathy Lungs; Clear to auscultation bilaterally Breast:  No dominant palpable mass, retraction, or nipple discharge Cardiovascular: Regular rate and rhythm Abdomen:  Soft, non tender, no hepatosplenomegaly Pelvic:  External genitalia is normal in appearance, no lesions.  The vagina is normal in appearance. Urethra has no lesions or masses. The cervix is smooth.  Uterus is felt to be normal size, shape, and contour.  No adnexal masses or tenderness noted.Bladder is non tender, no masses felt. Rectal: Good sphincter tone, no polyps, or hemorrhoids felt.  Hemoccult negative. Extremities/musculoskeletal:  No swelling or varicosities noted, no clubbing or cyanosis Psych:  No mood changes, alert and cooperative,seems happy PHQ 2 score  0 Fall risk is low Exam was performed, with pt's permission without chaperone.  Will get [place] to assess uterus and will talk when results back.   Impression: 1. Encounter for gynecological examination with Papanicolaou smear of cervix   2. Screening for colorectal cancer   3. PMB (postmenopausal bleeding)       Plan: Pelvic US 7/14 at 12:30 pm at [place] yearly Colonoscopy in 2 years Labs with PCP Physical in 1 year Pap in 3 if normal

## 2019-01-08 ENCOUNTER — Telehealth: Payer: Self-pay | Admitting: Adult Health

## 2019-01-08 NOTE — Telephone Encounter (Signed)
Pt canceled [facility] appt fot 7/14 and made [place] here 7/30 at 11 am

## 2019-01-08 NOTE — Telephone Encounter (Signed)
Patient called stating that she would like to cancel the appointment for her ultrasound at [facility] and would like to wait the 3 weeks for her to be able to be seen in the office. Please contact pt

## 2019-01-09 LAB — CYTOLOGY - PAP
Diagnosis: NEGATIVE
HPV: NOT DETECTED

## 2019-01-13 ENCOUNTER — Ambulatory Visit (HOSPITAL_COMMUNITY): Payer: 59

## 2019-01-17 MED FILL — SM CLEARLAX POWDER: 17 | 30 days supply | Qty: 1020 | Fill #1

## 2019-01-30 ENCOUNTER — Ambulatory Visit (INDEPENDENT_AMBULATORY_CARE_PROVIDER_SITE_OTHER): Payer: 59

## 2019-01-30 ENCOUNTER — Telehealth: Payer: Self-pay | Admitting: Adult Health

## 2019-01-30 ENCOUNTER — Other Ambulatory Visit: Payer: Self-pay

## 2019-01-30 DIAGNOSIS — N950 Postmenopausal bleeding: Secondary | ICD-10-CM

## 2019-01-30 NOTE — Telephone Encounter (Signed)
Pt aware that she has multiple fibroids, ovaries normal and EEC 4,7 mm, she is aware that MD has not read [place], she does not want biopsy she wants uterus removed, will call and make appt with MD

## 2019-01-30 NOTE — Progress Notes (Signed)
PELVIC US TA/TV: heterogenous uterus with mult fibroids,(#1) fundal subserosal fibroid 3.1 x 2.9 x 2.1 cm,(#2) fundal right subserosal fibroid 3.1x 2.6 x 2 cm,(#3) posterior submucosal 1.1 x .9 x 1.1 cm,EEC 4.7 mm,normal ovaries bilat,ovaries appear mobile,no free fluid,no pain during ultrasound

## 2019-02-02 ENCOUNTER — Encounter: Payer: Self-pay | Admitting: Allergy & Immunology

## 2019-02-11 ENCOUNTER — Telehealth: Payer: Self-pay | Admitting: Cardiovascular Disease

## 2019-02-11 NOTE — Telephone Encounter (Signed)
Virtual Visit Pre-Appointment Phone Call  "(Name), I am calling you today to discuss your upcoming appointment. We are currently trying to limit exposure to the virus that causes COVID-19 by seeing patients at home rather than in the office."  1. "What is the BEST phone number to call the day of the visit?" - include this in appointment notes  2. Do you have or have access to (through a family member/friend) a smartphone with video capability that we can use for your visit?" a. If yes - list this number in appt notes as cell (if different from BEST phone #) and list the appointment type as a VIDEO visit in appointment notes b. If no - list the appointment type as a PHONE visit in appointment notes  3. Confirm consent - "In the setting of the current Covid19 crisis, you are scheduled for a (phone or video) visit with your provider on (date) at (time).  Just as we do with many in-office visits, in order for you to participate in this visit, we must obtain consent.  If you'd like, I can send this to your mychart (if signed up) or email for you to review.  Otherwise, I can obtain your verbal consent now.  All virtual visits are billed to your insurance company just like a normal visit would be.  By agreeing to a virtual visit, we'd like you to understand that the technology does not allow for your provider to perform an examination, and thus may limit your provider's ability to fully assess your condition. If your provider identifies any concerns that need to be evaluated in person, we will make arrangements to do so.  Finally, though the technology is pretty good, we cannot assure that it will always work on either your or our end, and in the setting of a video visit, we may have to convert it to a phone-only visit.  In either situation, we cannot ensure that we have a secure connection.  Are you willing to proceed?" STAFF: Did the patient verbally acknowledge consent to telehealth visit? Document  YES/NO here: yes  4. Advise patient to be prepared - "Two hours prior to your appointment, go ahead and check your blood pressure, pulse, oxygen saturation, and your weight (if you have the equipment to check those) and write them all down. When your visit starts, your provider will ask you for this information. If you have an Apple Watch or Kardia device, please plan to have heart rate information ready on the day of your appointment. Please have a pen and paper handy nearby the day of the visit as well."  5. Give patient instructions for MyChart download to smartphone OR Doximity/Doxy.me as below if video visit (depending on what platform provider is using)  6. Inform patient they will receive a phone call 15 minutes prior to their appointment time (may be from unknown caller ID) so they should be prepared to answer    TELEPHONE CALL NOTE  Ginamarie Viona Gilmore Mcbain has been deemed a candidate for a follow-up tele-health visit to limit community exposure during the Covid-19 pandemic. I spoke with the patient via phone to ensure availability of phone/video source, confirm preferred email & phone number, and discuss instructions and expectations.  I reminded Konstantina KENIAH KLEMMER to be prepared with any vital sign and/or heart rhythm information that could potentially be obtained via home monitoring, at the time of her visit. I reminded Tanija JONETTE WASSEL to expect a phone call prior to  her visit.  Weston Anna 02/11/2019 11:39 AM   INSTRUCTIONS FOR DOWNLOADING THE MYCHART APP TO SMARTPHONE  - The patient must first make sure to have activated MyChart and know their login information - If Apple, go to [employer] and type in MyChart in the search bar and download the app. If Android, ask patient to go to [employer] and type in [place] in the search bar and download the app. The app is free but as with any other app downloads, their phone may require them to verify saved payment information or  Apple/Android password.  - The patient will need to then log into the app with their MyChart username and password, and select [facility] as their healthcare provider to link the account. When it is time for your visit, go to the MyChart app, find appointments, and click Begin Video Visit. Be sure to Select Allow for your device to access the Microphone and Camera for your visit. You will then be connected, and your provider will be with you shortly.  **If they have any issues connecting, or need assistance please contact MyChart service desk [phone])**  **If using a computer, in order to ensure the best quality for their visit they will need to use either of the following Internet Browsers: [employer], or Google Chrome**  IF USING DOXIMITY or DOXY.ME - The patient will receive a link just prior to their visit by text.     FULL LENGTH CONSENT FOR TELE-HEALTH VISIT   I hereby voluntarily request, consent and authorize [facility] and its employed or contracted physicians, physician assistants, nurse practitioners or other licensed health care professionals (the Practitioner), to provide me with telemedicine health care services (the Services") as deemed necessary by the treating Practitioner. I acknowledge and consent to receive the Services by the Practitioner via telemedicine. I understand that the telemedicine visit will involve communicating with the Practitioner through live audiovisual communication technology and the disclosure of certain medical information by electronic transmission. I acknowledge that I have been given the opportunity to request an in-person assessment or other available alternative prior to the telemedicine visit and am voluntarily participating in the telemedicine visit.  I understand that I have the right to withhold or withdraw my consent to the use of telemedicine in the course of my care at any time, without affecting my right to future care  or treatment, and that the Practitioner or I may terminate the telemedicine visit at any time. I understand that I have the right to inspect all information obtained and/or recorded in the course of the telemedicine visit and may receive copies of available information for a reasonable fee.  I understand that some of the potential risks of receiving the Services via telemedicine include:   Delay or interruption in medical evaluation due to technological equipment failure or disruption;  Information transmitted may not be sufficient (e.g. poor resolution of images) to allow for appropriate medical decision making by the Practitioner; and/or   In rare instances, security protocols could fail, causing a breach of personal health information.  Furthermore, I acknowledge that it is my responsibility to provide information about my medical history, conditions and care that is complete and accurate to the best of my ability. I acknowledge that Practitioner's advice, recommendations, and/or decision may be based on factors not within their control, such as incomplete or inaccurate data provided by me or distortions of diagnostic images or specimens that may result from electronic transmissions. I  understand that the practice of medicine is not an exact science and that Practitioner makes no warranties or guarantees regarding treatment outcomes. I acknowledge that I will receive a copy of this consent concurrently upon execution via email to the email address I last provided but may also request a printed copy by calling the office of [facility].    I understand that my insurance will be billed for this visit.   I have read or had this consent read to me.  I understand the contents of this consent, which adequately explains the benefits and risks of the Services being provided via telemedicine.   I have been provided ample opportunity to ask questions regarding this consent and the Services and have had  my questions answered to my satisfaction.  I give my informed consent for the services to be provided through the use of telemedicine in my medical care  By participating in this telemedicine visit I agree to the above.

## 2019-02-12 ENCOUNTER — Telehealth: Payer: Self-pay | Admitting: *Deleted

## 2019-02-12 NOTE — Progress Notes (Signed)
We received notification from Rose Delgado that she is interested in pursuing allergen immunotherapy. Prescriptions written and routed to the Immunotherapy Team.     Salvatore Marvel, MD  [facility] [place] [facility]

## 2019-02-12 NOTE — Telephone Encounter (Signed)
Pt has an appt made to start allergy injections. Please make prescriptions, preferably asap per Marcie Bal.

## 2019-02-12 NOTE — Addendum Note (Signed)
Addended by: Valentina Shaggy on: 02/12/2019 04:43 PM   Modules accepted: Orders

## 2019-02-13 NOTE — Telephone Encounter (Signed)
Done.  Rose Marvel, MD [facility] [place] [facility]

## 2019-02-13 NOTE — Progress Notes (Addendum)
VIALS EXP 02-16-2020 labels needed

## 2019-02-16 DIAGNOSIS — J3089 Other allergic rhinitis: Secondary | ICD-10-CM | POA: Diagnosis not present

## 2019-02-17 DIAGNOSIS — J301 Allergic rhinitis due to pollen: Secondary | ICD-10-CM | POA: Diagnosis not present

## 2019-02-19 ENCOUNTER — Encounter: Payer: Self-pay | Admitting: Cardiovascular Disease

## 2019-02-19 ENCOUNTER — Other Ambulatory Visit: Payer: Self-pay | Admitting: Cardiovascular Disease

## 2019-02-19 ENCOUNTER — Telehealth (INDEPENDENT_AMBULATORY_CARE_PROVIDER_SITE_OTHER): Payer: 59 | Admitting: Cardiovascular Disease

## 2019-02-19 VITALS — BP 127/74 | HR 68 | Temp 98.3°F | Ht 63.0 in | Wt 145.0 lb

## 2019-02-19 DIAGNOSIS — I10 Essential (primary) hypertension: Secondary | ICD-10-CM

## 2019-02-19 DIAGNOSIS — I493 Ventricular premature depolarization: Secondary | ICD-10-CM

## 2019-02-19 DIAGNOSIS — R002 Palpitations: Secondary | ICD-10-CM | POA: Diagnosis not present

## 2019-02-19 MED FILL — CARTIA XT 180 MG CAPSULE SA: 180 | 90 days supply | Qty: 90 | Fill #2

## 2019-02-19 MED FILL — hydrALAZINE HCL 25 MG TABS: 25 | 90 days supply | Qty: 180 | Fill #1

## 2019-02-19 NOTE — Patient Instructions (Signed)
Your physician wants you to follow-up in: 1 YEAR WITH DR KONESWARAN You will receive a reminder letter in the mail two months in advance. If you don't receive a letter, please call our office to schedule the follow-up appointment.  Your physician recommends that you continue on your current medications as directed. Please refer to the Current Medication list given to you today.  Thank you for choosing [facility]!!    

## 2019-02-19 NOTE — Progress Notes (Signed)
Virtual Visit via Telephone Note   This visit type was conducted due to national recommendations for restrictions regarding the COVID-19 Pandemic (e.g. social distancing) in an effort to limit this patient's exposure and mitigate transmission in our community.  Due to her co-morbid illnesses, this patient is at least at moderate risk for complications without adequate follow up.  This format is felt to be most appropriate for this patient at this time.  The patient did not have access to video technology/had technical difficulties with video requiring transitioning to audio format only (telephone).  All issues noted in this document were discussed and addressed.  No physical exam could be performed with this format.  Please refer to the patient's chart for her  consent to telehealth for [facility].   Date:  02/19/2019   ID:  Rose Delgado, DOB [birth year], MRN [id number]  Patient Location: Home Provider Location: Office  PCP:  Caryl Bis, MD  Cardiologist:  Kate Sable, MD  Electrophysiologist:  None   Evaluation Performed:  Follow-Up Visit  Chief Complaint:  Palpitations  History of Present Illness:    Rose Delgado is a 58 y.o. female with a history of palpitations.  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  She got [place] in February and was sick for 22 days.  She has some residual issues with taste and smell.  When she switched to generic diltiazem, she felt more palpitations. She is doing much better now. She denies chest pain.  The patient does not have symptoms concerning for COVID-19 infection (fever, chills, cough, or new shortness of breath).    Past Medical History:  Diagnosis Date  . Allergic rhinitis   . Angio-edema   . Fibroids   . Irregular heart beat   . Migraines   . Perimenopause 03/09/2014  . Reflux   . Urticaria    Past Surgical History:  Procedure Laterality Date  . ADENOIDECTOMY    . BREAST BIOPSY Left 2008   benign  . COLONOSCOPY  06/2011   Dr. Britta Mccreedy: per patient it was normal, follow up in 10 years.   . ESOPHAGOGASTRODUODENOSCOPY     Dr. Berneta Sages: late 1990s/early 2000  . ESOPHAGOGASTRODUODENOSCOPY N/A 05/19/2014   SLF:NO OBVIOUS SOURCE FOR DYSGEUSIA IDENTIFED/MILD Non-erosive gastritis  . INCISION AND DRAINAGE Right 07/31/2017   Procedure: INCISION AND DRAINAGE RIGHT THUMB NAIL BED;  Surgeon: Charlotte Crumb, MD;  Location: [place];  Service: Orthopedics;  Laterality: Right;  . INGUINAL HERNIA REPAIR    . KNEE SURGERY Right   . NAILBED REPAIR Right 07/31/2017   Procedure: NAILBED BIOPSY;  Surgeon: Charlotte Crumb, MD;  Location: [place];  Service: Orthopedics;  Laterality: Right;  . TONSILLECTOMY    . TONSILLECTOMY AND ADENOIDECTOMY       Current Meds  Medication Sig  . cetirizine (ZYRTEC) 10 MG tablet Take 1 tablet (10 mg total) by mouth 2 (two) times daily.  . cholecalciferol (VITAMIN D) 1000 units tablet Take 1,000 Units by mouth daily.  . cyclobenzaprine (FLEXERIL) 10 MG tablet Take 1 tablet by mouth every 8 (eight) hours as needed.  . diltiazem (CARDIZEM CD) 180 MG 24 hr capsule Take 1 capsule (180 mg total) by mouth daily.  . fluticasone (FLONASE) 50 MCG/ACT nasal spray Place 2 sprays into both nostrils 2 (two) times daily.  . hydrALAZINE (APRESOLINE) 25 MG tablet Take 1 tablet (25 mg total) by mouth 2 (two) times daily.  Rose Delgado Kitchen  Magnesium 250 MG TABS Take 1 tablet by mouth daily.  . methocarbamol (ROBAXIN) 500 MG tablet Take 500 mg by mouth every 8 (eight) hours as needed.   . montelukast (SINGULAIR) 10 MG tablet Take 1 tablet (10 mg total) by mouth at bedtime.  . Multiple Vitamins-Minerals (SM COMPLETE 50+) TABS Take 1 tablet by mouth daily.  . Omega-3 Fatty Acids (FISH OIL) 1000 MG CAPS Take by mouth.  . ondansetron (ZOFRAN) 4 MG tablet Take 1 tablet (4 mg total) by mouth every 4 (four) hours as needed for nausea or vomiting.  . pantoprazole  (PROTONIX) 40 MG tablet Take 1 tablet by mouth daily.  . Polyethylene Glycol 3350 (MIRALAX PO) Take 17 g by mouth daily.  . rizatriptan (MAXALT-MLT) 10 MG disintegrating tablet Take 10 mg by mouth as needed for migraine. May repeat in 2 hours if needed  . Vitamin D, Ergocalciferol, (DRISDOL) 1.25 MG (50000 UT) CAPS capsule Take 1 capsule by mouth once a week.  . vitamin E 100 UNIT capsule Take 100 Units by mouth daily.     Allergies:   Metoprolol, Shellfish allergy, Flagyl [metronidazole], Lactose intolerance (gi), Losartan, Topamax [topiramate], Ciprofloxacin hcl, and Diflucan [fluconazole]   Social History   Tobacco Use  . Smoking status: Never Smoker  . Smokeless tobacco: Never Used  Substance Use Topics  . Alcohol use: No  . Drug use: No     Family Hx: The patient's family history includes Cancer in her brother and sister; Hypertension in her brother, brother, brother, mother, sister, sister, sister, and sister; Other in her father and mother; Sleep apnea in her brother. There is no history of Colon cancer, Allergic rhinitis, or Asthma.  ROS:   Please see the history of present illness.     All other systems reviewed and are negative.   Prior CV studies:   The following studies were reviewed today:  NA  Labs/Other Tests and Data Reviewed:    EKG:  No ECG reviewed.  Recent Labs: 06/04/2018: ALT 49; BUN 11; Creatinine, Ser 0.90; Hemoglobin 13.5; Platelets 290; Sodium 139 07/31/2018: Magnesium 2.1; Potassium 3.9; TSH 0.702   Recent Lipid Panel Lab Results  Component Value Date/Time   CHOL 228 (H) 06/04/2018 10:20 AM   TRIG 150 (H) 06/04/2018 10:20 AM   HDL 45 06/04/2018 10:20 AM   CHOLHDL 5.1 06/04/2018 10:20 AM   LDLCALC 153 (H) 06/04/2018 10:20 AM    Wt Readings from Last 3 Encounters:  02/19/19 145 lb (65.8 kg)  01/07/19 146 lb (66.2 kg)  08/06/18 148 lb 3.2 oz (67.2 kg)     Objective:    Vital Signs:  BP 127/74   Pulse 68   Temp 98.3 F (36.8 C)   Ht  5\' 3"  (1.6 m)   Wt 145 lb (65.8 kg)   LMP 07/29/2017   BMI 25.69 kg/m    VITAL SIGNS:  reviewed  ASSESSMENT & PLAN:    1.  Palpitations/frequent PVCs: Continue long-acting diltiazem 180 mg daily and short acting diltiazem 30 mg as needed.  Symptomatically stable.  2.  Hypertension: Blood pressure is normal.  Continue long-acting diltiazem and hydralazine 25 mg twice daily.    COVID-19 Education: The signs and symptoms of COVID-19 were discussed with the patient and how to seek care for testing (follow up with PCP or arrange E-visit).  The importance of social distancing was discussed today.  Time:   Today, I have spent 5 minutes with the patient with telehealth technology discussing  the above problems.     Medication Adjustments/Labs and Tests Ordered: Current medicines are reviewed at length with the patient today.  Concerns regarding medicines are outlined above.   Tests Ordered: No orders of the defined types were placed in this encounter.   Medication Changes: No orders of the defined types were placed in this encounter.   Follow Up:  Virtual Visit or In Person in 1 year(s)  Signed, Kate Sable, MD  02/19/2019 8:21 AM    [place]

## 2019-02-25 ENCOUNTER — Other Ambulatory Visit: Payer: Self-pay

## 2019-02-25 ENCOUNTER — Ambulatory Visit (INDEPENDENT_AMBULATORY_CARE_PROVIDER_SITE_OTHER): Payer: 59 | Admitting: *Deleted

## 2019-02-25 DIAGNOSIS — J309 Allergic rhinitis, unspecified: Secondary | ICD-10-CM | POA: Diagnosis not present

## 2019-02-27 MED ORDER — EPINEPHRINE 0.3 MG/0.3ML IJ SOAJ: 0.3000 mg | Freq: Once | INTRAMUSCULAR | 1 refills | Status: AC

## 2019-02-27 MED FILL — EPINEPHRINE 0.3 MG AUTO-INJ: 0.3 | 30 days supply | Qty: 2 | Fill #0

## 2019-02-27 NOTE — Addendum Note (Signed)
Addended by: Hartley Barefoot on: 02/27/2019 04:44 PM   Modules accepted: Orders

## 2019-02-27 NOTE — Progress Notes (Signed)
Immunotherapy   Patient Details  Name: Rose Delgado MRN: [id number] Date of Birth: 06-01-1961  02/25/2019  Patient started allergy injections, Blue Vials, 1:100,000 with Expiration Date 02/16/2020 and received 0.05 ml of each vial. Following schedule: B  Frequency: Once a week. Epi-Pen: Epi-Pen RX sent to [facility]. Consent signed and patient instructions given. Patient waited 30 minutes in office after injections and no local or systemic reaction noted.  Maree Erie 02/27/2019, 4:18 PM

## 2019-03-06 ENCOUNTER — Ambulatory Visit (INDEPENDENT_AMBULATORY_CARE_PROVIDER_SITE_OTHER): Payer: 59

## 2019-03-06 DIAGNOSIS — J309 Allergic rhinitis, unspecified: Secondary | ICD-10-CM

## 2019-03-10 ENCOUNTER — Telehealth: Payer: Self-pay | Admitting: *Deleted

## 2019-03-10 NOTE — Telephone Encounter (Signed)
Patient faxed over signed paperwork to pick up vials and receive allergy injections at another office for convenience. Called and spoke with the patient and she stated that she will try to come by the [place] office tomorrow to pick up vials since the patient is working. Patient has been placed on the [place] schedule for tomorrow.

## 2019-03-11 ENCOUNTER — Ambulatory Visit (INDEPENDENT_AMBULATORY_CARE_PROVIDER_SITE_OTHER): Payer: 59

## 2019-03-11 ENCOUNTER — Encounter: Payer: Self-pay | Admitting: *Deleted

## 2019-03-11 ENCOUNTER — Other Ambulatory Visit: Payer: Self-pay

## 2019-03-11 DIAGNOSIS — J309 Allergic rhinitis, unspecified: Secondary | ICD-10-CM | POA: Diagnosis not present

## 2019-03-11 MED ORDER — "TUBERCULIN-ALLERGY SYRINGES 27G X 1/2"" 1 ML MISC": 5 refills | Status: DC

## 2019-03-11 MED ORDER — "NEEDLE (DISP) 27G X 1"" MISC": 0.5000 mL | 12 refills | Status: DC

## 2019-03-11 MED ORDER — "BD ECLIPSE NEEDLE 27G X 1/2"" MISC": 12 refills | Status: DC

## 2019-03-11 MED FILL — BD TB SYRINGE 27GX1/2: 27G X 1/2"" | 50 days supply | Qty: 100 | Fill #0

## 2019-03-11 NOTE — Telephone Encounter (Signed)
Patient picked up vials in the [place] office.

## 2019-03-11 NOTE — Progress Notes (Signed)
Immunotherapy   Patient Details  Name: Rose Delgado MRN: [id number] Date of Birth: 26-Dec-1960  03/11/2019  Rose Delgado here to pick up  MOLDS-CR-DM & RW-T-C Following schedule: B  Frequency:1-2 times per week Epi-Pen:Epi-Pen Available   Consent signed, patient instructions given, injection log given.  Patient was unable to wait the 30 minutes post injection. 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 She verbalized understanding of injection protocols.    [name] 03/11/2019, 8:43 AM

## 2019-03-11 NOTE — Addendum Note (Signed)
Addended by: Lucrezia Starch I on: 03/11/2019 09:48 AM   Modules accepted: Orders

## 2019-03-11 NOTE — Addendum Note (Signed)
Addended by: Lucrezia Starch I on: 03/11/2019 11:01 AM   Modules accepted: Orders

## 2019-03-19 ENCOUNTER — Encounter: Payer: Self-pay | Admitting: *Deleted

## 2019-03-19 ENCOUNTER — Encounter: Payer: Self-pay | Admitting: Cardiovascular Disease

## 2019-03-19 ENCOUNTER — Other Ambulatory Visit: Payer: Self-pay

## 2019-03-19 ENCOUNTER — Telehealth (INDEPENDENT_AMBULATORY_CARE_PROVIDER_SITE_OTHER): Payer: 59 | Admitting: Cardiovascular Disease

## 2019-03-19 VITALS — BP 137/80 | HR 82 | Resp 16 | Ht 63.0 in | Wt 147.6 lb

## 2019-03-19 DIAGNOSIS — I10 Essential (primary) hypertension: Secondary | ICD-10-CM

## 2019-03-19 DIAGNOSIS — R002 Palpitations: Secondary | ICD-10-CM

## 2019-03-19 DIAGNOSIS — I493 Ventricular premature depolarization: Secondary | ICD-10-CM

## 2019-03-19 MED ORDER — DILTIAZEM HCL ER COATED BEADS 240 MG PO CP24: 240.0000 mg | ORAL_CAPSULE | Freq: Every day | ORAL | 2 refills | Status: DC

## 2019-03-19 MED FILL — CARTIA XT 240 MG CAPSULE SA: 240 | 90 days supply | Qty: 90 | Fill #0

## 2019-03-19 NOTE — Progress Notes (Signed)
Virtual Visit via Telephone Note   This visit type was conducted due to national recommendations for restrictions regarding the COVID-19 Pandemic (e.g. social distancing) in an effort to limit this patient's exposure and mitigate transmission in our community.  Due to her co-morbid illnesses, this patient is at least at moderate risk for complications without adequate follow up.  This format is felt to be most appropriate for this patient at this time.  The patient did not have access to video technology/had technical difficulties with video requiring transitioning to audio format only (telephone).  All issues noted in this document were discussed and addressed.  No physical exam could be performed with this format.  Please refer to the patient's chart for her  consent to telehealth for [facility].   Date:  03/19/2019   ID:  Rose Delgado, DOB 1961-04-23, MRN [id number]  Patient Location: ED (working at [address] [facility] [employer] ED) Provider Location: Office  PCP:  Caryl Bis, MD  Cardiologist:  Kate Sable, MD  Electrophysiologist:  None   Evaluation Performed:  Follow-Up Visit  Chief Complaint:  Palpitations  History of Present Illness:    Rose Delgado is a 58 y.o. female with a history of palpitations.  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  She got [place] in February and was sick for 22 days.  She's been having more palpitations at night and taking diltiazem for the last couple of weeks.  When she has them she feels fatigued.  She denies chest pain and leg edema.  She has also been having more mid thoracic back pain lately.  She has chronic left foot numbness since 2017. She has left foot cramps as well.  She is more stressed lately at work.  She lives at home by herself with her dog.  The patient does not have symptoms concerning for COVID-19 infection (fever, chills, cough).    Past Medical History:  Diagnosis Date  . Allergic rhinitis   .  Angio-edema   . Fibroids   . Irregular heart beat   . Migraines   . Perimenopause 03/09/2014  . Reflux   . Urticaria    Past Surgical History:  Procedure Laterality Date  . ADENOIDECTOMY    . BREAST BIOPSY Left 2008   benign  . COLONOSCOPY  06/2011   Dr. Britta Mccreedy: per patient it was normal, follow up in 10 years.   . ESOPHAGOGASTRODUODENOSCOPY     Dr. Berneta Sages: late 1990s/early 2000  . ESOPHAGOGASTRODUODENOSCOPY N/A 05/19/2014   SLF:NO OBVIOUS SOURCE FOR DYSGEUSIA IDENTIFED/MILD Non-erosive gastritis  . INCISION AND DRAINAGE Right 07/31/2017   Procedure: INCISION AND DRAINAGE RIGHT THUMB NAIL BED;  Surgeon: Charlotte Crumb, MD;  Location: [place];  Service: Orthopedics;  Laterality: Right;  . INGUINAL HERNIA REPAIR    . KNEE SURGERY Right   . NAILBED REPAIR Right 07/31/2017   Procedure: NAILBED BIOPSY;  Surgeon: Charlotte Crumb, MD;  Location: [place];  Service: Orthopedics;  Laterality: Right;  . TONSILLECTOMY    . TONSILLECTOMY AND ADENOIDECTOMY       Current Meds  Medication Sig  . Biotin (BIOTIN 5000) 5 MG CAPS Take 1 capsule by mouth daily.  . cetirizine (ZYRTEC) 10 MG tablet Take 1 tablet (10 mg total) by mouth 2 (two) times daily.  . cholecalciferol (VITAMIN D) 1000 units tablet Take 1,000 Units by mouth daily.  . cyclobenzaprine (FLEXERIL) 10 MG tablet Take 1 tablet by mouth every 8 (  eight) hours as needed.  . diltiazem (CARDIZEM CD) 180 MG 24 hr capsule Take 1 capsule (180 mg total) by mouth daily.  Marland Kitchen diltiazem (CARDIZEM) 30 MG tablet Take 30 mg by mouth daily as needed.  . fluticasone (FLONASE) 50 MCG/ACT nasal spray Place 2 sprays into both nostrils 2 (two) times daily.  . hydrALAZINE (APRESOLINE) 25 MG tablet Take 1 tablet (25 mg total) by mouth 2 (two) times daily.  . Magnesium 250 MG TABS Take 1 tablet by mouth daily.  . methocarbamol (ROBAXIN) 500 MG tablet Take 500 mg by mouth every 8 (eight) hours as needed.   . montelukast  (SINGULAIR) 10 MG tablet Take 1 tablet (10 mg total) by mouth at bedtime.  . Multiple Vitamins-Minerals (SM COMPLETE 50+) TABS Take 1 tablet by mouth daily.  Marland Kitchen NEEDLE, DISP, 27 G (BD ECLIPSE NEEDLE) 27G X 1/2" MISC Use with allergy injection up to 2 times weekly as directed.  . Needle, Disp, 27G X 1" MISC Inject 0.5 mLs into the skin 2 (two) times a week. As directed  . Omega-3 Fatty Acids (FISH OIL) 1000 MG CAPS Take by mouth.  . ondansetron (ZOFRAN) 4 MG tablet Take 1 tablet (4 mg total) by mouth every 4 (four) hours as needed for nausea or vomiting.  . pantoprazole (PROTONIX) 40 MG tablet Take 1 tablet by mouth 2 (two) times daily.   . Polyethylene Glycol 3350 (MIRALAX PO) Take 17 g by mouth daily.  . rizatriptan (MAXALT-MLT) 10 MG disintegrating tablet Take 10 mg by mouth as needed for migraine. May repeat in 2 hours if needed  . Tuberculin-Allergy Syringes 27G X 1/2" 1 ML MISC Use as directed for allergy injections.  . Vitamin D, Ergocalciferol, (DRISDOL) 1.25 MG (50000 UT) CAPS capsule Take 1 capsule by mouth once a week.  . vitamin E 100 UNIT capsule Take 100 Units by mouth daily.     Allergies:   Metoprolol, Shellfish allergy, Flagyl [metronidazole], Lactose intolerance (gi), Losartan, Topamax [topiramate], Ciprofloxacin hcl, and Diflucan [fluconazole]   Social History   Tobacco Use  . Smoking status: Never Smoker  . Smokeless tobacco: Never Used  Substance Use Topics  . Alcohol use: No  . Drug use: No     Family Hx: The patient's family history includes Cancer in her brother and sister; Hypertension in her brother, brother, brother, mother, sister, sister, sister, and sister; Other in her father and mother; Sleep apnea in her brother. There is no history of Colon cancer, Allergic rhinitis, or Asthma.  ROS:   Please see the history of present illness.     All other systems reviewed and are negative.   Prior CV studies:   The following studies were reviewed today:  NA   Labs/Other Tests and Data Reviewed:    EKG:  No ECG reviewed.  Recent Labs: 06/04/2018: ALT 49; BUN 11; Creatinine, Ser 0.90; Hemoglobin 13.5; Platelets 290; Sodium 139 07/31/2018: Magnesium 2.1; Potassium 3.9; TSH 0.702   Recent Lipid Panel Lab Results  Component Value Date/Time   CHOL 228 (H) 06/04/2018 10:20 AM   TRIG 150 (H) 06/04/2018 10:20 AM   HDL 45 06/04/2018 10:20 AM   CHOLHDL 5.1 06/04/2018 10:20 AM   LDLCALC 153 (H) 06/04/2018 10:20 AM    Wt Readings from Last 3 Encounters:  03/19/19 147 lb 9.6 oz (67 kg)  02/19/19 145 lb (65.8 kg)  01/07/19 146 lb (66.2 kg)     Objective:    Vital Signs:  BP 137/80  Pulse 82   Resp 16   Ht 5\' 3"  (1.6 m)   Wt 147 lb 9.6 oz (67 kg)   LMP 07/29/2017   SpO2 96%   BMI 26.15 kg/m    VITAL SIGNS:  reviewed  ASSESSMENT & PLAN:    1. Palpitations/frequent PVCs: She has been having more palpitations recently and attributes this to work-related stress.  She has had to take 30 mg of diltiazem every evening for the past 2 weeks which helps her sleep.   I will increase long-acting diltiazem to 240 mg daily.  She will continue to take short acting diltiazem 30 mg as needed.    2. Hypertension: Blood pressure is normal. I will monitor given medication adjustments noted above.   COVID-19 Education: The signs and symptoms of COVID-19 were discussed with the patient and how to seek care for testing (follow up with PCP or arrange E-visit).  The importance of social distancing was discussed today.  Time:   Today, I have spent 10 minutes with the patient with telehealth technology discussing the above problems.     Medication Adjustments/Labs and Tests Ordered: Current medicines are reviewed at length with the patient today.  Concerns regarding medicines are outlined above.   Tests Ordered: No orders of the defined types were placed in this encounter.   Medication Changes: No orders of the defined types were placed in this  encounter.   Follow Up:  Virtual Visit tomorrow as per patient request  Signed, Kate Sable, MD  03/19/2019 3:27 PM    [place]

## 2019-03-19 NOTE — Addendum Note (Signed)
Addended by: Merlene Laughter on: 03/19/2019 04:00 PM   Modules accepted: Orders

## 2019-03-19 NOTE — Patient Instructions (Addendum)
Medication Instructions:   Your physician has recommended you make the following change in your medication:   Increase diltiazem to 240 mg by mouth daily  Continue all other medications the same  Labwork:  NONE  Testing/Procedures:  NONE  Follow-Up:  Your physician recommends that you schedule a follow-up appointment in: 03/20/2019 @3 :40 pm for a phone visit with Dr. Bronson Ing.  Any Other Special Instructions Will Be Listed Below (If Applicable).  If you need a refill on your cardiac medications before your next appointment, please call your pharmacy.

## 2019-03-20 ENCOUNTER — Encounter: Payer: Self-pay | Admitting: Cardiovascular Disease

## 2019-03-20 ENCOUNTER — Telehealth: Payer: 59 | Admitting: Cardiovascular Disease

## 2019-03-20 ENCOUNTER — Other Ambulatory Visit: Payer: Self-pay

## 2019-03-20 ENCOUNTER — Telehealth (INDEPENDENT_AMBULATORY_CARE_PROVIDER_SITE_OTHER): Payer: 59 | Admitting: Cardiovascular Disease

## 2019-03-20 VITALS — BP 130/81 | HR 73 | Ht 63.0 in | Wt 146.0 lb

## 2019-03-20 DIAGNOSIS — I10 Essential (primary) hypertension: Secondary | ICD-10-CM

## 2019-03-20 DIAGNOSIS — R002 Palpitations: Secondary | ICD-10-CM

## 2019-03-20 DIAGNOSIS — I493 Ventricular premature depolarization: Secondary | ICD-10-CM

## 2019-03-20 NOTE — Patient Instructions (Addendum)
Medication Instructions: Your physician recommends that you continue on your current medications as directed. Please refer to the Current Medication list given to you today.   Labwork: None today  Procedures/Testing: None  Follow-Up: 3 months Virtual visit ,telephone, with Dr.Koneswaran  Any Additional Special Instructions Will Be Listed Below (If Applicable).     If you need a refill on your cardiac medications before your next appointment, please call your pharmacy.     Thank you for choosing [facility] !

## 2019-03-20 NOTE — Progress Notes (Signed)
Virtual Visit via Telephone Note   This visit type was conducted due to national recommendations for restrictions regarding the COVID-19 Pandemic (e.g. social distancing) in an effort to limit this patient's exposure and mitigate transmission in our community.  Due to her co-morbid illnesses, this patient is at least at moderate risk for complications without adequate follow up.  This format is felt to be most appropriate for this patient at this time.  The patient did not have access to video technology/had technical difficulties with video requiring transitioning to audio format only (telephone).  All issues noted in this document were discussed and addressed.  No physical exam could be performed with this format.  Please refer to the patient's chart for her  consent to telehealth for [employer].   Date:  03/20/2019   ID:  Rose Delgado, DOB [birth year], MRN [id number]  Patient Location: Home Provider Location: Office  PCP:  Rose Bis, MD  Cardiologist:  Kate Sable, MD  Electrophysiologist:  None   Evaluation Performed:  Follow-Up Visit  Chief Complaint:  Palpitations  History of Present Illness:    Rose Delgado is a 58 y.o. female with palpitations.  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  She told me she has been having more palpitations recently and attributed it to work-related stress.  I increased long-acting diltiazem to 240 mg daily yesterday.  She asked for another appointment today to discuss her symptoms further as she is off from work.  She told me about a lot of home stressors regarding her siblings. She has no close friends to confide in. Her sister Rose Delgado who is almost 69 lives across the street and is her closest friend.  I reviewed lipids from 12/04/18, LDL 141.  She took an extra dose of diltiazem (30 mg) last night.  Soc Hx: 2 other sisters live in [place], [place] and [place]. She has other sisters and brothers too. 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 She lives at home by herself with her dog. Divorced since 45. No children.   Past Medical History:  Diagnosis Date  . Allergic rhinitis   . Angio-edema   . Fibroids   . Irregular heart beat   . Migraines   . Perimenopause 03/09/2014  . Reflux   . Urticaria    Past Surgical History:  Procedure Laterality Date  . ADENOIDECTOMY    . BREAST BIOPSY Left 2008   benign  . COLONOSCOPY  06/2011   Dr. Britta Mccreedy: per patient it was normal, follow up in 10 years.   . ESOPHAGOGASTRODUODENOSCOPY     Dr. Berneta Sages: late 1990s/early 2000  . ESOPHAGOGASTRODUODENOSCOPY N/A 05/19/2014   SLF:NO OBVIOUS SOURCE FOR DYSGEUSIA IDENTIFED/MILD Non-erosive gastritis  . INCISION AND DRAINAGE Right 07/31/2017   Procedure: INCISION AND DRAINAGE RIGHT THUMB NAIL BED;  Surgeon: Charlotte Crumb, MD;  Location: [place];  Service: Orthopedics;  Laterality: Right;  . INGUINAL HERNIA REPAIR    . KNEE SURGERY Right   . NAILBED REPAIR Right 07/31/2017   Procedure: NAILBED BIOPSY;  Surgeon: Charlotte Crumb, MD;  Location: [place];  Service: Orthopedics;  Laterality: Right;  . TONSILLECTOMY    . TONSILLECTOMY AND ADENOIDECTOMY       Current Meds  Medication Sig  . azelastine (ASTELIN) 0.1 % nasal spray Place 2 sprays into both nostrils 2 (two) times daily  for 30 days. Use in each nostril as directed  . Biotin (BIOTIN 5000) 5 MG CAPS Take 1 capsule by mouth daily.  . cetirizine (ZYRTEC) 10 MG tablet Take 1 tablet (10 mg total) by mouth 2 (two) times daily.  . cholecalciferol (VITAMIN D) 1000 units tablet Take 1,000 Units by mouth daily.  . cyclobenzaprine (FLEXERIL) 10 MG tablet Take 1 tablet by mouth every 8 (eight) hours as needed.  . diltiazem (CARDIZEM CD) 240 MG 24 hr capsule Take 1 capsule (240 mg total) by mouth daily.  Marland Kitchen diltiazem (CARDIZEM) 30 MG tablet Take 30 mg by mouth daily as  needed.  . fluticasone (FLONASE) 50 MCG/ACT nasal spray Place 2 sprays into both nostrils 2 (two) times daily.  . hydrALAZINE (APRESOLINE) 25 MG tablet Take 1 tablet (25 mg total) by mouth 2 (two) times daily.  . Magnesium 250 MG TABS Take 1 tablet by mouth daily.  . methocarbamol (ROBAXIN) 500 MG tablet Take 500 mg by mouth every 8 (eight) hours as needed.   . montelukast (SINGULAIR) 10 MG tablet Take 1 tablet (10 mg total) by mouth at bedtime.  . Multiple Vitamins-Minerals (SM COMPLETE 50+) TABS Take 1 tablet by mouth daily.  Marland Kitchen NEEDLE, DISP, 27 G (BD ECLIPSE NEEDLE) 27G X 1/2" MISC Use with allergy injection up to 2 times weekly as directed.  . Needle, Disp, 27G X 1" MISC Inject 0.5 mLs into the skin 2 (two) times a week. As directed  . Omega-3 Fatty Acids (FISH OIL) 1000 MG CAPS Take by mouth.  . ondansetron (ZOFRAN) 4 MG tablet Take 1 tablet (4 mg total) by mouth every 4 (four) hours as needed for nausea or vomiting.  . pantoprazole (PROTONIX) 40 MG tablet Take 1 tablet by mouth 2 (two) times daily.   . Polyethylene Glycol 3350 (MIRALAX PO) Take 17 g by mouth daily.  . rizatriptan (MAXALT-MLT) 10 MG disintegrating tablet Take 10 mg by mouth as needed for migraine. May repeat in 2 hours if needed  . Tuberculin-Allergy Syringes 27G X 1/2" 1 ML MISC Use as directed for allergy injections.  . Vitamin D, Ergocalciferol, (DRISDOL) 1.25 MG (50000 UT) CAPS capsule Take 1 capsule by mouth once a week.  . vitamin E 100 UNIT capsule Take 100 Units by mouth daily.     Allergies:   Metoprolol, Shellfish allergy, Flagyl [metronidazole], Lactose intolerance (gi), Losartan, Topamax [topiramate], Ciprofloxacin hcl, and Diflucan [fluconazole]   Social History   Tobacco Use  . Smoking status: Never Smoker  . Smokeless tobacco: Never Used  Substance Use Topics  . Alcohol use: No  . Drug use: No     Family Hx: The patient's family history includes Cancer in her brother and sister; Hypertension in her  brother, brother, brother, mother, sister, sister, sister, and sister; Other in her father and mother; Sleep apnea in her brother. There is no history of Colon cancer, Allergic rhinitis, or Asthma.  ROS:   Please see the history of present illness.     All other systems reviewed and are negative.   Prior CV studies:   The following studies were reviewed today:  NA  Labs/Other Tests and Data Reviewed:    EKG:  No ECG reviewed.  Recent Labs: 06/04/2018: ALT 49; BUN 11; Creatinine, Ser 0.90; Hemoglobin 13.5; Platelets 290; Sodium 139 07/31/2018: Magnesium 2.1; Potassium 3.9; TSH 0.702   Recent Lipid Panel Lab Results  Component Value Date/Time   CHOL 228 (H) 06/04/2018 10:20 AM   TRIG  150 (H) 06/04/2018 10:20 AM   HDL 45 06/04/2018 10:20 AM   CHOLHDL 5.1 06/04/2018 10:20 AM   LDLCALC 153 (H) 06/04/2018 10:20 AM    Wt Readings from Last 3 Encounters:  03/20/19 146 lb (66.2 kg)  03/19/19 147 lb 9.6 oz (67 kg)  02/19/19 145 lb (65.8 kg)     Objective:    Vital Signs:  BP 130/81   Pulse 73   Ht 5\' 3"  (1.6 m)   Wt 146 lb (66.2 kg)   LMP 07/29/2017   BMI 25.86 kg/m    VITAL SIGNS:  reviewed  ASSESSMENT & PLAN:    1. Palpitations/frequent PVCs: She has been having more palpitations recently and attributes this to work-related stress.  She has had to take 30 mg of diltiazem every evening for the past 2 weeks which helps her sleep.  I increased long-acting diltiazem to 240 mg daily on 03/19/19.  She will continue to take short acting diltiazem 30 mg as needed.   2. Hypertension: Blood pressure is normal. I will monitor given medication adjustments noted above.     COVID-19 Education: The signs and symptoms of COVID-19 were discussed with the patient and how to seek care for testing (follow up with PCP or arrange E-visit).  The importance of social distancing was discussed today.  Time:   Today, I have spent 10 minutes with the patient with telehealth technology  discussing the above problems.     Medication Adjustments/Labs and Tests Ordered: Current medicines are reviewed at length with the patient today.  Concerns regarding medicines are outlined above.   Tests Ordered: No orders of the defined types were placed in this encounter.   Medication Changes: No orders of the defined types were placed in this encounter.   Follow Up:  In Person in 3 month(s)  Signed, Kate Sable, MD  03/20/2019 1:49 PM    [facility]

## 2019-03-22 MED FILL — VIT D2 1.25 MG (50,000 UNIT: 1.25 MG | 84 days supply | Qty: 24 | Fill #1

## 2019-03-23 MED FILL — SM COMPLETE 50+ TABLET: 125 days supply | Qty: 125 | Fill #0

## 2019-03-23 MED FILL — MONTELUKAST SOD 10 MG TAB: 10 | 30 days supply | Qty: 30 | Fill #0

## 2019-03-23 MED FILL — CETIRIZINE HCL 10 MG TABS: 10 | 90 days supply | Qty: 90 | Fill #0

## 2019-04-01 MED FILL — PANTOPRAZOLE SOD DR 40 MG T: 40 | 90 days supply | Qty: 90 | Fill #0

## 2019-04-06 ENCOUNTER — Other Ambulatory Visit (HOSPITAL_COMMUNITY): Payer: Self-pay | Admitting: Family Medicine

## 2019-04-06 DIAGNOSIS — Z1231 Encounter for screening mammogram for malignant neoplasm of breast: Secondary | ICD-10-CM

## 2019-04-09 MED FILL — RIZATRIPTAN BENZOATE 10 MG: 10 | 30 days supply | Qty: 9 | Fill #0

## 2019-04-10 ENCOUNTER — Ambulatory Visit (INDEPENDENT_AMBULATORY_CARE_PROVIDER_SITE_OTHER): Payer: 59

## 2019-04-10 DIAGNOSIS — J309 Allergic rhinitis, unspecified: Secondary | ICD-10-CM

## 2019-04-10 NOTE — Progress Notes (Signed)
Immunotherapy   Patient Details  Name: Rose Delgado MRN: [id number] Date of Birth: 06-24-1961  04/10/2019  [name]  Gold/Yellow Vial Pick up Following schedule: B  Frequency: Weekly  Epi-Pen: Yes  Consent signed and patient instructions given.   [name] 04/10/2019, 3:49 PM

## 2019-04-13 MED FILL — SM CLEARLAX POWDER: 17 | 30 days supply | Qty: 1020 | Fill #2

## 2019-05-06 NOTE — Telephone Encounter (Signed)
Error

## 2019-05-18 ENCOUNTER — Other Ambulatory Visit: Payer: Self-pay

## 2019-05-18 ENCOUNTER — Ambulatory Visit (HOSPITAL_COMMUNITY)
Admission: RE | Admit: 2019-05-18 | Discharge: 2019-05-18 | Disposition: A | Discharge status: Home / Self Care | Payer: 59 | Source: Ambulatory Visit | Attending: Family Medicine | Admitting: Family Medicine

## 2019-05-18 DIAGNOSIS — Z1231 Encounter for screening mammogram for malignant neoplasm of breast: Secondary | ICD-10-CM | POA: Insufficient documentation

## 2019-05-20 ENCOUNTER — Ambulatory Visit (INDEPENDENT_AMBULATORY_CARE_PROVIDER_SITE_OTHER): Payer: 59

## 2019-05-20 ENCOUNTER — Other Ambulatory Visit (HOSPITAL_COMMUNITY)
Admission: RE | Admit: 2019-05-20 | Discharge: 2019-05-20 | Disposition: A | Discharge status: Home / Self Care | Payer: 59 | Source: Ambulatory Visit | Attending: Family Medicine | Admitting: Family Medicine

## 2019-05-20 DIAGNOSIS — I10 Essential (primary) hypertension: Secondary | ICD-10-CM | POA: Diagnosis not present

## 2019-05-20 DIAGNOSIS — E785 Hyperlipidemia, unspecified: Secondary | ICD-10-CM | POA: Diagnosis not present

## 2019-05-20 DIAGNOSIS — I351 Nonrheumatic aortic (valve) insufficiency: Secondary | ICD-10-CM | POA: Insufficient documentation

## 2019-05-20 DIAGNOSIS — J309 Allergic rhinitis, unspecified: Secondary | ICD-10-CM

## 2019-05-20 DIAGNOSIS — G43909 Migraine, unspecified, not intractable, without status migrainosus: Secondary | ICD-10-CM | POA: Diagnosis not present

## 2019-05-20 DIAGNOSIS — K219 Gastro-esophageal reflux disease without esophagitis: Secondary | ICD-10-CM | POA: Insufficient documentation

## 2019-05-20 DIAGNOSIS — Z0184 Encounter for antibody response examination: Secondary | ICD-10-CM | POA: Insufficient documentation

## 2019-05-20 LAB — CBC WITH DIFFERENTIAL/PLATELET
Abs Immature Granulocytes: 0.03 10*3/uL (ref 0.00–0.07)
Basophils Absolute: 0 10*3/uL (ref 0.0–0.1)
Basophils Relative: 0 %
Eosinophils Absolute: 0.1 10*3/uL (ref 0.0–0.5)
Eosinophils Relative: 1 %
HCT: 43.8 % (ref 36.0–46.0)
Hemoglobin: 14 g/dL (ref 12.0–15.0)
Immature Granulocytes: 0 %
Lymphocytes Relative: 31 %
Lymphs Abs: 2.4 10*3/uL (ref 0.7–4.0)
MCH: 30.6 pg (ref 26.0–34.0)
MCHC: 32 g/dL (ref 30.0–36.0)
MCV: 95.8 fL (ref 80.0–100.0)
Monocytes Absolute: 0.6 10*3/uL (ref 0.1–1.0)
Monocytes Relative: 8 %
Neutro Abs: 4.5 10*3/uL (ref 1.7–7.7)
Neutrophils Relative %: 60 %
Platelets: 258 10*3/uL (ref 150–400)
RBC: 4.57 MIL/uL (ref 3.87–5.11)
RDW: 12.3 % (ref 11.5–15.5)
WBC: 7.7 10*3/uL (ref 4.0–10.5)
nRBC: 0 % (ref 0.0–0.2)

## 2019-05-20 LAB — LIPID PANEL
Cholesterol: 271 mg/dL — ABNORMAL HIGH (ref 0–200)
HDL: 53 mg/dL (ref >40)
LDL Cholesterol: 181 mg/dL — ABNORMAL HIGH (ref 0–99)
Total CHOL/HDL Ratio: 5.1 RATIO
Triglycerides: 185 mg/dL — ABNORMAL HIGH (ref <150)
VLDL: 37 mg/dL (ref 0–40)

## 2019-05-20 LAB — COMPREHENSIVE METABOLIC PANEL
ALT: 53 U/L — ABNORMAL HIGH (ref 0–44)
AST: 34 U/L (ref 15–41)
Albumin: 4.7 g/dL (ref 3.5–5.0)
Alkaline Phosphatase: 114 U/L (ref 38–126)
Anion gap: 10 (ref 5–15)
BUN: 12 mg/dL (ref 6–20)
CO2: 26 mmol/L (ref 22–32)
Calcium: 10.4 mg/dL — ABNORMAL HIGH (ref 8.9–10.3)
Chloride: 103 mmol/L (ref 98–111)
Creatinine, Ser: 0.81 mg/dL (ref 0.44–1.00)
GFR calc Af Amer: >60 mL/min (ref >60)
GFR calc non Af Amer: >60 mL/min (ref >60)
Glucose, Bld: 105 mg/dL — ABNORMAL HIGH (ref 70–99)
Potassium: 3.9 mmol/L (ref 3.5–5.1)
Sodium: 139 mmol/L (ref 135–145)
Total Bilirubin: 0.5 mg/dL (ref 0.3–1.2)
Total Protein: 8.4 g/dL — ABNORMAL HIGH (ref 6.5–8.1)

## 2019-05-20 LAB — SAR COV2 SEROLOGY (COVID19)AB(IGG),IA: SARS-CoV-2 Ab, IgG: REACTIVE — AB

## 2019-05-20 LAB — TSH: TSH: 0.555 u[IU]/mL (ref 0.350–4.500)

## 2019-05-20 NOTE — Progress Notes (Signed)
Immunotherapy   Patient Details  Name: Rose Delgado MRN: [id number] Date of Birth: [birth year]  05/20/2019  Prien Patient received both injections waited for 30 minutes no reactions. Picked up Green vials to take with her Following schedule: B Frequency:Weekly Epi-Pen:Yes  Consent signed and patient instructions given.   [name] 05/20/2019, 11:45 AM

## 2019-05-21 LAB — HIV ANTIBODY (ROUTINE TESTING W REFLEX): HIV Screen 4th Generation wRfx: NONREACTIVE — AB

## 2019-05-21 LAB — HEPATITIS C ANTIBODY: HCV Ab: <0.1 s/co ratio — AB (ref 0.0–0.9)

## 2019-06-02 DIAGNOSIS — E782 Mixed hyperlipidemia: Secondary | ICD-10-CM | POA: Diagnosis not present

## 2019-06-02 DIAGNOSIS — J301 Allergic rhinitis due to pollen: Secondary | ICD-10-CM | POA: Diagnosis not present

## 2019-06-02 DIAGNOSIS — I10 Essential (primary) hypertension: Secondary | ICD-10-CM | POA: Diagnosis not present

## 2019-06-02 DIAGNOSIS — E559 Vitamin D deficiency, unspecified: Secondary | ICD-10-CM | POA: Diagnosis not present

## 2019-06-02 DIAGNOSIS — K219 Gastro-esophageal reflux disease without esophagitis: Secondary | ICD-10-CM | POA: Diagnosis not present

## 2019-06-02 DIAGNOSIS — G43909 Migraine, unspecified, not intractable, without status migrainosus: Secondary | ICD-10-CM | POA: Diagnosis not present

## 2019-06-02 DIAGNOSIS — Z6825 Body mass index (BMI) 25.0-25.9, adult: Secondary | ICD-10-CM | POA: Diagnosis not present

## 2019-06-06 MED FILL — CARTIA XT 240 MG CAPSULE SA: 240 | 90 days supply | Qty: 90 | Fill #1

## 2019-06-17 ENCOUNTER — Encounter: Payer: Self-pay | Admitting: Cardiovascular Disease

## 2019-06-17 ENCOUNTER — Telehealth (INDEPENDENT_AMBULATORY_CARE_PROVIDER_SITE_OTHER): Payer: 59 | Admitting: Cardiovascular Disease

## 2019-06-17 VITALS — BP 136/70 | HR 78 | Ht 63.0 in | Wt 150.0 lb

## 2019-06-17 DIAGNOSIS — Z719 Counseling, unspecified: Secondary | ICD-10-CM

## 2019-06-17 DIAGNOSIS — I493 Ventricular premature depolarization: Secondary | ICD-10-CM

## 2019-06-17 DIAGNOSIS — I10 Essential (primary) hypertension: Secondary | ICD-10-CM

## 2019-06-17 DIAGNOSIS — R002 Palpitations: Secondary | ICD-10-CM

## 2019-06-17 DIAGNOSIS — E782 Mixed hyperlipidemia: Secondary | ICD-10-CM

## 2019-06-17 DIAGNOSIS — E785 Hyperlipidemia, unspecified: Secondary | ICD-10-CM

## 2019-06-17 NOTE — Patient Instructions (Signed)

## 2019-06-17 NOTE — Progress Notes (Signed)
Virtual Visit via Telephone Note   This visit type was conducted due to national recommendations for restrictions regarding the COVID-19 Pandemic (e.g. social distancing) in an effort to limit this patient's exposure and mitigate transmission in our community.  Due to her co-morbid illnesses, this patient is at least at moderate risk for complications without adequate follow up.  This format is felt to be most appropriate for this patient at this time.  The patient did not have access to video technology/had technical difficulties with video requiring transitioning to audio format only (telephone).  All issues noted in this document were discussed and addressed.  No physical exam could be performed with this format.  Please refer to the patient's chart for her  consent to telehealth for [facility].   Date:  06/17/2019   ID:  Rose Delgado, DOB [birth year], MRN [id number]  Patient Location: Home Provider Location: Home  PCP:  Caryl Bis, MD  Cardiologist:  Kate Sable, MD  Electrophysiologist:  None   Evaluation Performed:  Follow-Up Visit  Chief Complaint:  Palpitations  History of Present Illness:    Rose Delgado is a 58 y.o. female with palpitations.  Since increasing the dose of long-acting diltiazem to 240 mg daily, she has done much better.  She has not had to take an extra dose of 30 mg diltiazem.  She has been finishing her basement and has been active around the house.  However, she has not done any intentional exercise since January.  Her cholesterol is elevated which is reviewed in detail below.  We discussed lifestyle modification.  Soc Hx: She has a sister in her 19's who lives across the street and is her closest friend. Two other sisters live in [place], [place] and [place]. She has other sisters and brothers too. 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 She lives at home by herself with her dog.  Divorced since 18. No children.  Past Medical History:  Diagnosis Date  . Allergic rhinitis   . Angio-edema   . Fibroids   . Irregular heart beat   . Migraines   . Perimenopause 03/09/2014  . Reflux   . Urticaria    Past Surgical History:  Procedure Laterality Date  . ADENOIDECTOMY    . BREAST BIOPSY Left 2008   benign  . COLONOSCOPY  06/2011   Dr. Britta Mccreedy: per patient it was normal, follow up in 10 years.   . ESOPHAGOGASTRODUODENOSCOPY     Dr. Berneta Sages: late 1990s/early 2000  . ESOPHAGOGASTRODUODENOSCOPY N/A 05/19/2014   SLF:NO OBVIOUS SOURCE FOR DYSGEUSIA IDENTIFED/MILD Non-erosive gastritis  . INCISION AND DRAINAGE Right 07/31/2017   Procedure: INCISION AND DRAINAGE RIGHT THUMB NAIL BED;  Surgeon: Charlotte Crumb, MD;  Location: [place];  Service: Orthopedics;  Laterality: Right;  . INGUINAL HERNIA REPAIR    . KNEE SURGERY Right   . NAILBED REPAIR Right 07/31/2017   Procedure: NAILBED BIOPSY;  Surgeon: Charlotte Crumb, MD;  Location: [place];  Service: Orthopedics;  Laterality: Right;  . TONSILLECTOMY    . TONSILLECTOMY AND ADENOIDECTOMY       Current Meds  Medication Sig  . Biotin (BIOTIN 5000) 5 MG CAPS Take 1 capsule by mouth daily.  . cetirizine (ZYRTEC) 10 MG tablet Take 1 tablet (10 mg total) by mouth 2 (two) times daily.  . cholecalciferol (VITAMIN D) 1000 units tablet Take 1,000 Units by  mouth daily.  . cyclobenzaprine (FLEXERIL) 10 MG tablet Take 1 tablet by mouth every 8 (eight) hours as needed.  . diltiazem (CARDIZEM CD) 240 MG 24 hr capsule Take 1 capsule (240 mg total) by mouth daily.  Marland Kitchen diltiazem (CARDIZEM) 30 MG tablet Take 30 mg by mouth daily as needed.  . fluticasone (FLONASE) 50 MCG/ACT nasal spray Place 2 sprays into both nostrils 2 (two) times daily.  . Magnesium 250 MG TABS Take 1 tablet by mouth daily.  . methocarbamol (ROBAXIN) 500 MG tablet Take 500 mg by mouth every 8 (eight) hours as needed.   .  montelukast (SINGULAIR) 10 MG tablet Take 1 tablet (10 mg total) by mouth at bedtime.  . Multiple Vitamins-Minerals (SM COMPLETE 50+) TABS Take 1 tablet by mouth daily.  Marland Kitchen NEEDLE, DISP, 27 G (BD ECLIPSE NEEDLE) 27G X 1/2" MISC Use with allergy injection up to 2 times weekly as directed.  . Needle, Disp, 27G X 1" MISC Inject 0.5 mLs into the skin 2 (two) times a week. As directed  . ondansetron (ZOFRAN) 4 MG tablet Take 1 tablet (4 mg total) by mouth every 4 (four) hours as needed for nausea or vomiting.  . pantoprazole (PROTONIX) 40 MG tablet Take 1 tablet by mouth 2 (two) times daily.   . Polyethylene Glycol 3350 (MIRALAX PO) Take 17 g by mouth daily.  . rizatriptan (MAXALT-MLT) 10 MG disintegrating tablet Take 10 mg by mouth as needed for migraine. May repeat in 2 hours if needed  . Tuberculin-Allergy Syringes 27G X 1/2" 1 ML MISC Use as directed for allergy injections.  . Vitamin D, Ergocalciferol, (DRISDOL) 1.25 MG (50000 UT) CAPS capsule Take 1 capsule by mouth once a week.  . vitamin E 100 UNIT capsule Take 100 Units by mouth daily.     Allergies:   Metoprolol, Shellfish allergy, Flagyl [metronidazole], Lactose intolerance (gi), Losartan, Topamax [topiramate], Ciprofloxacin hcl, and Diflucan [fluconazole]   Social History   Tobacco Use  . Smoking status: Never Smoker  . Smokeless tobacco: Never Used  Substance Use Topics  . Alcohol use: No  . Drug use: No     Family Hx: The patient's family history includes Cancer in her brother and sister; Hypertension in her brother, brother, brother, mother, sister, sister, sister, and sister; Other in her father and mother; Sleep apnea in her brother. There is no history of Colon cancer, Allergic rhinitis, or Asthma.  ROS:   Please see the history of present illness.     All other systems reviewed and are negative.   Prior CV studies:   The following studies were reviewed today:  NA  Labs/Other Tests and Data Reviewed:    EKG:  No  ECG reviewed.  Recent Labs: 07/31/2018: Magnesium 2.1 05/20/2019: ALT 53; BUN 12; Creatinine, Ser 0.81; Hemoglobin 14.0; Platelets 258; Potassium 3.9; Sodium 139; TSH 0.555   Recent Lipid Panel Lab Results  Component Value Date/Time   CHOL 271 (H) 05/20/2019 10:22 AM   TRIG 185 (H) 05/20/2019 10:22 AM   HDL 53 05/20/2019 10:22 AM   CHOLHDL 5.1 05/20/2019 10:22 AM   LDLCALC 181 (H) 05/20/2019 10:22 AM    Wt Readings from Last 3 Encounters:  06/17/19 150 lb (68 kg)  03/20/19 146 lb (66.2 kg)  03/19/19 147 lb 9.6 oz (67 kg)     Objective:    Vital Signs:  BP 136/70   Pulse 78   Ht 5\' 3"  (1.6 m)   Wt 150 lb (68  kg)   LMP 07/29/2017   BMI 26.57 kg/m    VITAL SIGNS:  reviewed  ASSESSMENT & PLAN:    1. Palpitations/frequent PVCs:Symptomatically improved. Continuelong-acting diltiazem 240mg  daily (increased dosage on 03/19/19).She will continue to takeshort acting diltiazem 30 mg as needed.   2. Hypertension: Blood pressure is normal. No changes.  3. Hyperlipidemia: Lipids reviewed above. We discussed lifestyle modification. She hasn't exercised since January.    COVID-19 Education: The signs and symptoms of COVID-19 were discussed with the patient and how to seek care for testing (follow up with PCP or arrange E-visit).  The importance of social distancing was discussed today.  Time:   Today, I have spent 10 minutes with the patient with telehealth technology discussing the above problems.     Medication Adjustments/Labs and Tests Ordered: Current medicines are reviewed at length with the patient today.  Concerns regarding medicines are outlined above.   Tests Ordered: No orders of the defined types were placed in this encounter.   Medication Changes: No orders of the defined types were placed in this encounter.   Follow Up:  Virtual Visit  in 6 month(s)  Signed, Kate Sable, MD  06/17/2019 8:19 AM    [place]

## 2019-06-19 MED FILL — hydrALAZINE HCL 25 MG TABS: 25 | 90 days supply | Qty: 180 | Fill #2

## 2019-06-19 MED FILL — AZELASTINE HCL 137 MCG SPRY: 0.1 | 75 days supply | Qty: 90 | Fill #0

## 2019-06-19 MED FILL — FLUTICASONE PROP 50 MCG SPR: 50 | 90 days supply | Qty: 48 | Fill #1

## 2019-06-19 MED FILL — MONTELUKAST SOD 10 MG TAB: 10 | 60 days supply | Qty: 60 | Fill #1

## 2019-06-19 MED FILL — RIZATRIPTAN BENZOATE 10 MG: 10 | 30 days supply | Qty: 9 | Fill #1

## 2019-06-20 MED FILL — SM CLEARLAX POWDER: 17 | 30 days supply | Qty: 1054 | Fill #0

## 2019-07-01 ENCOUNTER — Ambulatory Visit (INDEPENDENT_AMBULATORY_CARE_PROVIDER_SITE_OTHER): Payer: 59

## 2019-07-01 DIAGNOSIS — J309 Allergic rhinitis, unspecified: Secondary | ICD-10-CM | POA: Diagnosis not present

## 2019-07-04 MED FILL — PANTOPRAZOLE SOD DR 40 MG T: 40 | 90 days supply | Qty: 90 | Fill #1

## 2019-07-08 ENCOUNTER — Telehealth: Payer: Self-pay

## 2019-07-08 NOTE — Telephone Encounter (Signed)
Patient called informing that her last shot that she was given at work (she works in the [employer]) had a raised bump and redness with a lot of itching. I informed her to back herself down to the last dose she was able to tolerate without any reactions. This is her first red vial.

## 2019-07-16 MED FILL — VIT D2 1.25 MG (50,000 UNIT: 1.25 MG | 84 days supply | Qty: 24 | Fill #2

## 2019-08-06 ENCOUNTER — Telehealth: Payer: Self-pay

## 2019-08-06 NOTE — Telephone Encounter (Signed)
Patient called requesting to have a new set of red vials made so that she can continue her allergy injections. Patient picks up her vials in our [place] office and receives her injections at [facility]. Patient informed that she will need to have her injection records faxed to our office for review since she is completing her first set of red vials. Patient verbalized understanding and said that she will fax them today.

## 2019-08-06 NOTE — Telephone Encounter (Signed)
Patient's injection records were received from [facility] ED and reviewed by myself and [place]. Attempted to call patient to discuss findings. No answer. Left message to call our office.

## 2019-08-26 DIAGNOSIS — R509 Fever, unspecified: Secondary | ICD-10-CM | POA: Diagnosis not present

## 2019-08-26 DIAGNOSIS — R1110 Vomiting, unspecified: Secondary | ICD-10-CM | POA: Diagnosis not present

## 2019-08-26 DIAGNOSIS — R05 Cough: Secondary | ICD-10-CM | POA: Diagnosis not present

## 2019-08-27 ENCOUNTER — Other Ambulatory Visit: Payer: Self-pay | Admitting: Allergy & Immunology

## 2019-08-27 ENCOUNTER — Other Ambulatory Visit (HOSPITAL_COMMUNITY): Payer: Self-pay | Admitting: Family Medicine

## 2019-08-27 MED FILL — CETIRIZINE HCL 10 MG TABS: 10 | 90 days supply | Qty: 90 | Fill #0

## 2019-08-27 MED FILL — MONTELUKAST SOD 10 MG TAB: 10 | 30 days supply | Qty: 30 | Fill #0

## 2019-08-27 MED FILL — DILTIAZEM HCL ER COATED BEA: 240 | 90 days supply | Qty: 90 | Fill #2

## 2019-08-28 MED FILL — SM COMPLETE 50+ TABLET: 125 days supply | Qty: 125 | Fill #0

## 2019-09-01 DIAGNOSIS — H5213 Myopia, bilateral: Secondary | ICD-10-CM | POA: Diagnosis not present

## 2019-09-01 DIAGNOSIS — H40023 Open angle with borderline findings, high risk, bilateral: Secondary | ICD-10-CM | POA: Diagnosis not present

## 2019-09-01 DIAGNOSIS — H1045 Other chronic allergic conjunctivitis: Secondary | ICD-10-CM | POA: Diagnosis not present

## 2019-09-01 DIAGNOSIS — H2513 Age-related nuclear cataract, bilateral: Secondary | ICD-10-CM | POA: Diagnosis not present

## 2019-09-01 DIAGNOSIS — H0102B Squamous blepharitis left eye, upper and lower eyelids: Secondary | ICD-10-CM | POA: Diagnosis not present

## 2019-09-01 DIAGNOSIS — H35372 Puckering of macula, left eye: Secondary | ICD-10-CM | POA: Diagnosis not present

## 2019-09-01 DIAGNOSIS — H0102A Squamous blepharitis right eye, upper and lower eyelids: Secondary | ICD-10-CM | POA: Diagnosis not present

## 2019-09-15 ENCOUNTER — Telehealth: Payer: Self-pay

## 2019-09-15 MED ORDER — DILTIAZEM HCL ER COATED BEADS 240 MG PO CP24: 240.0000 mg | ORAL_CAPSULE | Freq: Every day | ORAL | 3 refills | Status: DC

## 2019-09-15 NOTE — Telephone Encounter (Signed)
escribed to [facility] out pt pharmacy

## 2019-09-15 NOTE — Telephone Encounter (Signed)
Rec'd call from Pt requesting a return call from a Nurse. No additional information given by Pt.  Please call work # [phone]  Thanks renee

## 2019-09-15 NOTE — Telephone Encounter (Signed)
Patient states if she is given generic Diltiazem she had break through palpitations.Requests change from Cardizem CD to Cardia XT

## 2019-09-15 NOTE — Telephone Encounter (Signed)
Can switch to an equivalent dose.

## 2019-09-15 NOTE — Telephone Encounter (Signed)
Returned call, told she was occupied in a patient room and will call back-cc

## 2019-09-28 ENCOUNTER — Other Ambulatory Visit: Payer: Self-pay | Admitting: Cardiovascular Disease

## 2019-09-28 ENCOUNTER — Other Ambulatory Visit: Payer: Self-pay | Admitting: Allergy & Immunology

## 2019-09-28 MED FILL — hydrALAZINE HCL 25 MG TABS: 25 | 90 days supply | Qty: 180 | Fill #0

## 2019-09-30 MED FILL — PANTOPRAZOLE SOD DR 40 MG T: 40 | 90 days supply | Qty: 90 | Fill #0

## 2019-10-01 ENCOUNTER — Ambulatory Visit: Payer: 59 | Admitting: Family Medicine

## 2019-10-01 ENCOUNTER — Encounter: Payer: Self-pay | Admitting: Family Medicine

## 2019-10-01 ENCOUNTER — Other Ambulatory Visit: Payer: Self-pay

## 2019-10-01 ENCOUNTER — Other Ambulatory Visit: Payer: Self-pay | Admitting: Family Medicine

## 2019-10-01 ENCOUNTER — Ambulatory Visit (INDEPENDENT_AMBULATORY_CARE_PROVIDER_SITE_OTHER): Payer: 59 | Admitting: Family Medicine

## 2019-10-01 DIAGNOSIS — J302 Other seasonal allergic rhinitis: Secondary | ICD-10-CM

## 2019-10-01 DIAGNOSIS — J3089 Other allergic rhinitis: Secondary | ICD-10-CM

## 2019-10-01 DIAGNOSIS — T7800XD Anaphylactic reaction due to unspecified food, subsequent encounter: Secondary | ICD-10-CM

## 2019-10-01 MED ORDER — EPINEPHRINE 0.3 MG/0.3ML IJ SOAJ: INTRAMUSCULAR | 2 refills | Status: DC

## 2019-10-01 MED FILL — EPINEPHRINE 0.3 MG AUTO-INJ: 0.3 | 30 days supply | Qty: 2 | Fill #0

## 2019-10-01 NOTE — Patient Instructions (Addendum)
Allergic rhinitis Continue cetirizine 10 mg once a day as needed for a runny nose Continue Flonase 1 spray in each nostril twice a day as needed for a stuffy nose Continue azelastine 2 sprays in each nostril twice a day as needed for a runny nose Consider saline nasal rinses as needed for nasal symptoms. Use this before any medicated nasal sprays for best result Allergen immunotherapy options include: 1. decreasing the dose of the vial containing mold, cockroach, and dust mite and continuing with the ragweed, tree, and cat vial at the regular dose. 2. Continue with the vial with ragweed, tree pollen, and cat and stop the vial with mold, cockroach, and dust mite. 3. Remove some of the allergens from the vial with mold, cockroach, and dust mite and continue with the ragweed, tree, and cat vial. 4. Stop injections. Just give a call and let us know which way you want to procede. Please call with any questions.   Food allergy Continue to avoid shellfish with the exception of shrimp. In case of an allergic reaction, take Benadryl 50 mg every 4 hours, and if life-threatening symptoms occur, inject with EpiPen 0.3 mg.  Call the clinic if this treatment plan is not working well for you  Follow up in 6 months or sooner if needed.

## 2019-10-01 NOTE — Progress Notes (Addendum)
RE: Rose Delgado MRN: [id number] DOB: June 08, 1961 Date of Telemedicine Visit: 10/01/2019  Referring provider: Caryl Bis, MD Primary care provider: Caryl Bis, MD  Chief Complaint: Allergic Rhinitis  (doing well with montelukast and ITX in [facility]), Medication Refill (90 days refills.. problems with ITX local reactions of itching, redness and swelling at inj site.  Mold is worse.), and Medication Problem (discuss re-making ITX vials.  Did take COVID Vaccine 08/25/2019.  Reaction with Covid symptoms.  Patient did have COVID 08/2018.)   Telemedicine Follow Up Visit via Telephone: I connected with Rose Delgado for a follow up on 10/01/19 by telephone and verified that I am speaking with the correct person using two identifiers.   I discussed the limitations, risks, security and privacy concerns of performing an evaluation and management service by telephone and the availability of in person appointments. I also discussed with the patient that there may be a patient responsible charge related to this service. The patient expressed understanding and agreed to proceed.  Patient is at home  Provider is at the office.  Visit start time: 9:37 Visit end time: 10:25 Insurance consent/check in by: [place] consent and medical assistant/nurse: Oralia Rud  History of Present Illness: She is a 59 y.o. female, who is being followed for allergic rhinitis currently on immunotherapy and allergy to shellfish. Her previous allergy office visit was on 08/27/2018 with Dr. Ernst Bowler. At today's visit, she reports allergic rhinitis has been moderately well controlled with occasional sneezing and some postnasal drainage for which she continues Flonase and azelastine in addition to Zyrtec 10 mg once a day, and montelukast 10 mg once a day.  She continues allergen immunotherapy with large local reactions occurring with a vial containing mold, cockroach, and dust mite.  She reports her last  injection was on Monday, March 30.  She reports some redness and itching on on the arm with the ragweed pollen, tree pollen, and cat.  She reports a large local swelling with heat and redness about the size of 4 cm occurring on the arm where she received the mold, cockroach, and dust mite injection.  She does report her allergen immunotherapy has reduced her allergic rhinitis significantly.  She continues to avoid shellfish with the exception of shrimp which she eats frequently with no adverse reaction.  She has continuous access to an autoinjector epinephrine set.  We will refill this for her today.  Her current medications are listed in the chart.  Assessment and Plan: Rose Delgado is a 59 y.o. female with: Patient Instructions  Allergic rhinitis Continue cetirizine 10 mg once a day as needed for a runny nose Continue Flonase 1 spray in each nostril twice a day as needed for a stuffy nose Continue azelastine 2 sprays in each nostril twice a day as needed for a runny nose Consider saline nasal rinses as needed for nasal symptoms. Use this before any medicated nasal sprays for best result Allergen immunotherapy options include: 1. decreasing the dose of the vial containing mold, cockroach, and dust mite and continuing with the ragweed, tree, and cat vial at the regular dose. 2. Continue with the vial with ragweed, tree pollen, and cat and stop the vial with mold, cockroach, and dust mite. 3. Remove some of the allergens from the vial with mold, cockroach, and dust mite and continue with the ragweed, tree, and cat vial. 4. Stop injections. Just give a call and let us know which way you want to procede. Please  call with any questions.   Food allergy Continue to avoid shellfish with the exception of shrimp. In case of an allergic reaction, take Benadryl 50 mg every 4 hours, and if life-threatening symptoms occur, inject with EpiPen 0.3 mg.  Call the clinic if this treatment plan is not working well for you   Follow up in 6 months or sooner if needed.     Return in about 6 months (around 04/01/2020), or if symptoms worsen or fail to improve.  Meds ordered this encounter  Medications  . EPINEPHrine 0.3 mg/0.3 mL IJ SOAJ injection    Sig: Use as directed for severe allergic reaction.    Dispense:  1 each    Refill:  2    Dispense one 2 pack    Medication List:  Current Outpatient Medications  Medication Sig Dispense Refill  . azelastine (ASTELIN) 0.1 % nasal spray Place 2 sprays into both nostrils 2 (two) times daily for 30 days. Use in each nostril as directed 90 mL 3  . Biotin (BIOTIN 5000) 5 MG CAPS Take 1 capsule by mouth daily.    . cetirizine (ZYRTEC) 10 MG tablet Take 1 tablet (10 mg total) by mouth 2 (two) times daily. 180 tablet 1  . cyclobenzaprine (FLEXERIL) 10 MG tablet Take 1 tablet by mouth every 8 (eight) hours as needed.  0  . diltiazem (CARTIA XT) 240 MG 24 hr capsule Take 1 capsule (240 mg total) by mouth daily. 90 capsule 3  . fluticasone (FLONASE) 50 MCG/ACT nasal spray Place 2 sprays into both nostrils 2 (two) times daily. 48 g 1  . hydrALAZINE (APRESOLINE) 25 MG tablet TAKE 1 TABLET BY MOUTH 2 TIMES DAILY. 180 tablet 3  . Magnesium 250 MG TABS Take 1 tablet by mouth daily.    . methocarbamol (ROBAXIN) 500 MG tablet Take 500 mg by mouth every 8 (eight) hours as needed.     . montelukast (SINGULAIR) 10 MG tablet TAKE 1 TABLET BY MOUTH AT BEDTIME. 30 tablet 0  . Multiple Vitamins-Minerals (SM COMPLETE 50+) TABS Take 1 tablet by mouth daily.    Marland Kitchen NEEDLE, DISP, 27 G (BD ECLIPSE NEEDLE) 27G X 1/2" MISC Use with allergy injection up to 2 times weekly as directed. 50 each 12  . Needle, Disp, 27G X 1" MISC Inject 0.5 mLs into the skin 2 (two) times a week. As directed 50 each 12  . ondansetron (ZOFRAN) 4 MG tablet Take 1 tablet (4 mg total) by mouth every 4 (four) hours as needed for nausea or vomiting. 30 tablet 2  . pantoprazole (PROTONIX) 40 MG tablet Take 1 tablet by mouth 2  (two) times daily.     . Polyethylene Glycol 3350 (MIRALAX PO) Take 17 g by mouth daily.    . rizatriptan (MAXALT-MLT) 10 MG disintegrating tablet Take 10 mg by mouth as needed for migraine. May repeat in 2 hours if needed    . Tuberculin-Allergy Syringes 27G X 1/2" 1 ML MISC Use as directed for allergy injections. 100 each 5  . Vitamin D, Ergocalciferol, (DRISDOL) 1.25 MG (50000 UT) CAPS capsule Take 1 capsule by mouth once a week.    . vitamin E 100 UNIT capsule Take 100 Units by mouth daily.    Marland Kitchen EPINEPHrine 0.3 mg/0.3 mL IJ SOAJ injection Use as directed for severe allergic reaction. 1 each 2   No current facility-administered medications for this visit.   Allergies: Allergies  Allergen Reactions  . Metoprolol Anaphylaxis  . Shellfish  Allergy Anaphylaxis  . Flagyl [Metronidazole] Nausea And Vomiting  . Lactose Intolerance (Gi) Nausea And Vomiting    And diarrhea  . Losartan Swelling  . Topamax [Topiramate] Other (See Comments)    "feeling of doom"  . Ciprofloxacin Hcl Nausea Only  . Diflucan [Fluconazole] Nausea And Vomiting   I reviewed her past medical history, social history, family history, and environmental history and no significant changes have been reported from previous visit on 08/27/2018.  Objective: Physical Exam Not obtained as encounter was done via telephone.   Previous notes and tests were reviewed.  I discussed the assessment and treatment plan with the patient. The patient was provided an opportunity to ask questions and all were answered. The patient agreed with the plan and demonstrated an understanding of the instructions.   The patient was advised to call back or seek an in-person evaluation if the symptoms worsen or if the condition fails to improve as anticipated.  I provided 58 minutes of non-face-to-face time during this encounter.  It was my pleasure to participate in Cassady Gholson's care today. Please feel free to contact me with any questions or  concerns.   Sincerely,  Gareth Morgan, FNP  ________________________________________________  I have provided oversight concerning Rose Delgado's evaluation and treatment of this patient's health issues addressed during today's encounter.  I agree with the assessment and therapeutic plan as outlined in the note.   Signed,   R Edgar Frisk, MD

## 2019-10-05 ENCOUNTER — Ambulatory Visit: Payer: 59 | Admitting: Family Medicine

## 2019-10-07 ENCOUNTER — Encounter: Payer: Self-pay | Admitting: Family Medicine

## 2019-10-07 ENCOUNTER — Other Ambulatory Visit: Payer: Self-pay | Admitting: Family Medicine

## 2019-10-07 MED ORDER — MONTELUKAST SODIUM 10 MG PO TABS: 10.0000 mg | ORAL_TABLET | Freq: Every day | ORAL | 1 refills | Status: DC

## 2019-10-07 MED FILL — MONTELUKAST SOD 10 MG TAB: 10 | 90 days supply | Qty: 90 | Fill #0

## 2019-10-09 NOTE — Telephone Encounter (Signed)
Anne please advise:  Hello Rose Delgado,   I was wondering if I could take certirizine twice day since I'll be off injections for so long and since I will be on lower dose. My sneezing has increased since being off injections. Or whatever you think will be best.    Thanks    Rose Delgado

## 2019-10-12 ENCOUNTER — Other Ambulatory Visit (HOSPITAL_COMMUNITY): Payer: Self-pay | Admitting: Family Medicine

## 2019-10-12 DIAGNOSIS — G43909 Migraine, unspecified, not intractable, without status migrainosus: Secondary | ICD-10-CM | POA: Diagnosis not present

## 2019-10-12 DIAGNOSIS — I10 Essential (primary) hypertension: Secondary | ICD-10-CM | POA: Diagnosis not present

## 2019-10-12 DIAGNOSIS — E559 Vitamin D deficiency, unspecified: Secondary | ICD-10-CM | POA: Diagnosis not present

## 2019-10-12 DIAGNOSIS — J301 Allergic rhinitis due to pollen: Secondary | ICD-10-CM | POA: Diagnosis not present

## 2019-10-12 DIAGNOSIS — E782 Mixed hyperlipidemia: Secondary | ICD-10-CM | POA: Diagnosis not present

## 2019-10-12 DIAGNOSIS — K219 Gastro-esophageal reflux disease without esophagitis: Secondary | ICD-10-CM | POA: Diagnosis not present

## 2019-10-12 MED FILL — CETIRIZINE HCL 10 MG TABS: 10 | 100 days supply | Qty: 200 | Fill #0

## 2019-10-12 MED FILL — FAMOTIDINE 20 MG TABLET: 20 | 90 days supply | Qty: 180 | Fill #0

## 2019-10-23 DIAGNOSIS — Z23 Encounter for immunization: Secondary | ICD-10-CM | POA: Diagnosis not present

## 2019-11-04 MED FILL — SM CLEARLAX POWDER: 17 | 29 days supply | Qty: 1020 | Fill #1

## 2019-11-17 NOTE — Progress Notes (Signed)
Vials exp 11-16-20

## 2019-11-18 DIAGNOSIS — J3089 Other allergic rhinitis: Secondary | ICD-10-CM | POA: Diagnosis not present

## 2019-11-25 DIAGNOSIS — M9903 Segmental and somatic dysfunction of lumbar region: Secondary | ICD-10-CM | POA: Diagnosis not present

## 2019-11-25 DIAGNOSIS — M545 Low back pain: Secondary | ICD-10-CM | POA: Diagnosis not present

## 2019-11-25 DIAGNOSIS — M9902 Segmental and somatic dysfunction of thoracic region: Secondary | ICD-10-CM | POA: Diagnosis not present

## 2019-11-25 DIAGNOSIS — M9906 Segmental and somatic dysfunction of lower extremity: Secondary | ICD-10-CM | POA: Diagnosis not present

## 2019-11-25 DIAGNOSIS — M9901 Segmental and somatic dysfunction of cervical region: Secondary | ICD-10-CM | POA: Diagnosis not present

## 2019-11-29 IMAGING — CT CT MAXILLOFACIAL W/O CM
3 series · 13 of 47 positions shown, 15 images · non-contrast
Comparison: None.

CLINICAL DATA: 56 y/o  F; chronic sinusitis and deviated septum.

EXAM:
CT MAXILLOFACIAL WITHOUT CONTRAST
TECHNIQUE: Multidetector CT images of the paranasal sinuses were obtained using
the standard protocol without intravenous contrast.

[Series 2: standard · axial · 0.37mm/px · z∈[-520,-406]mm · 7 of 140 slices shown, 9 images]
[im 15/140  brain]
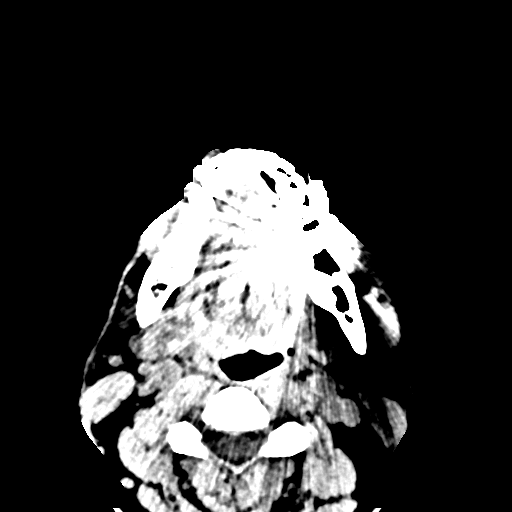
[im 15/140  bone]
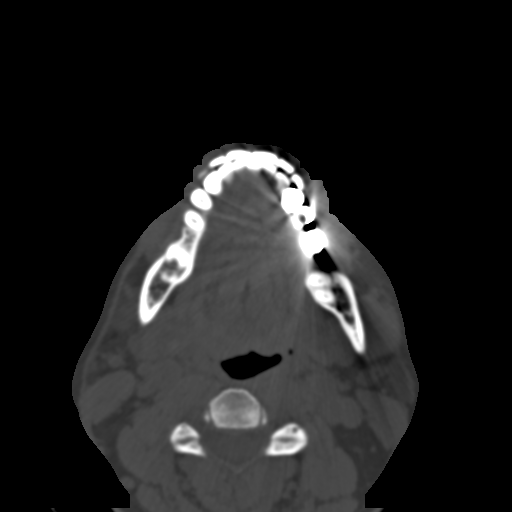
[im 34/140  bone]
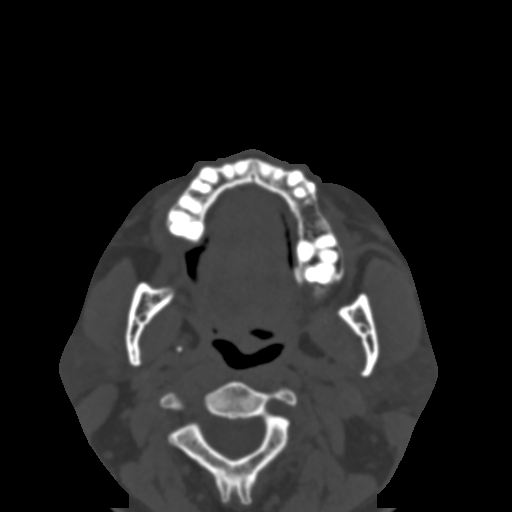
[im 53/140  bone]
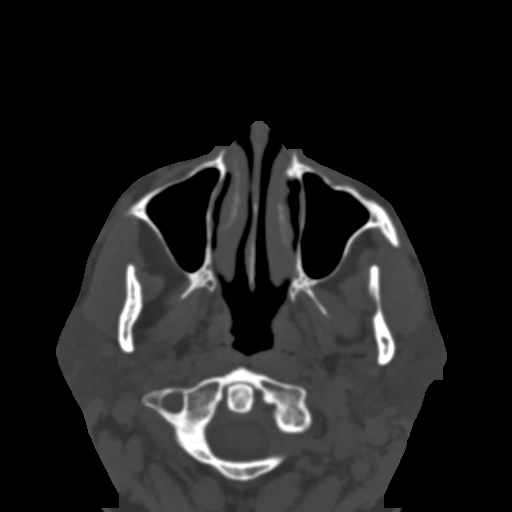
[im 72/140  bone]
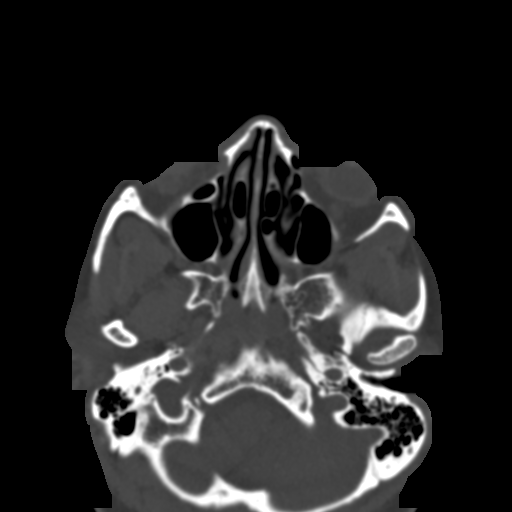
[im 92/140  brain]
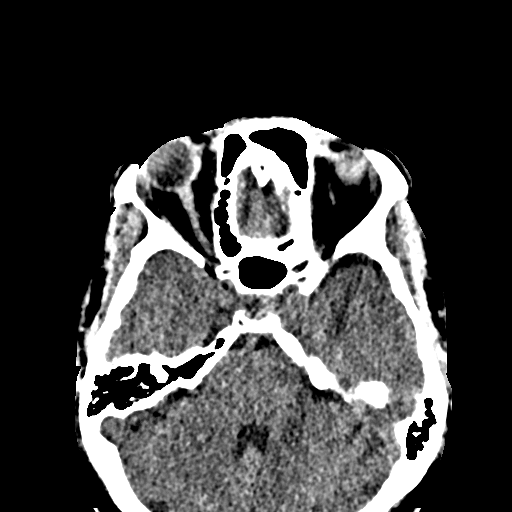
[im 92/140  bone]
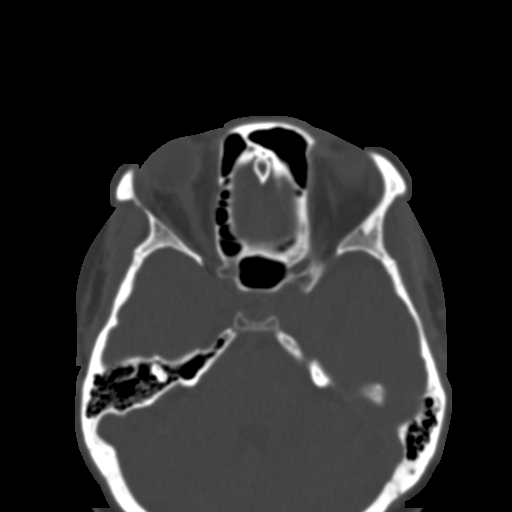
[im 111/140  bone]
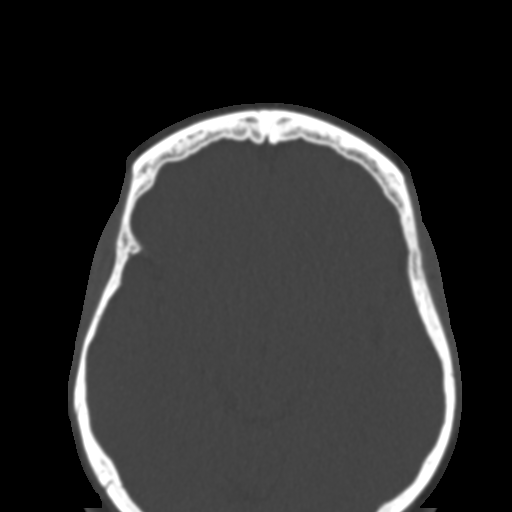
[im 130/140  bone]
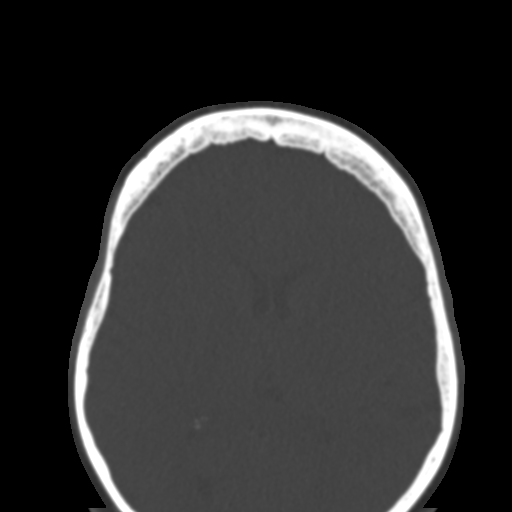

[Series 4: coronal · coronal · 0.27mm/px · 3 of 71 slices shown]
[im 24/71  bone]
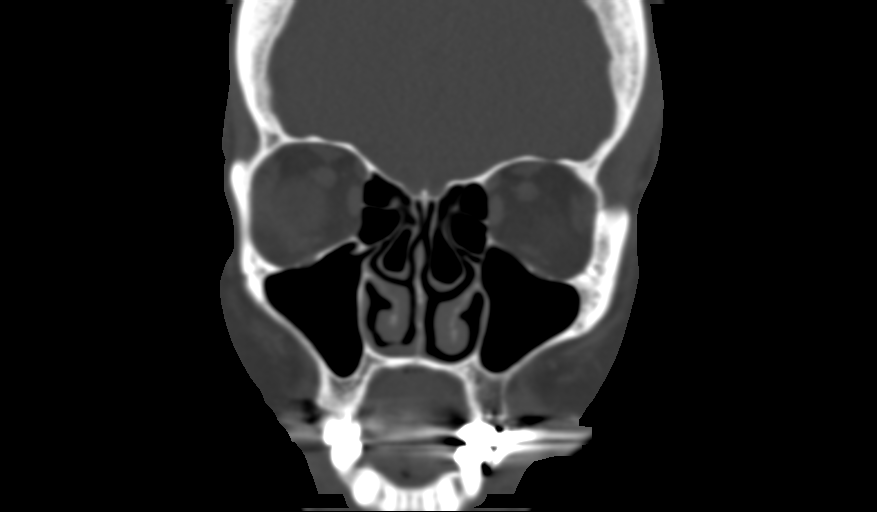
[im 32/71  bone]
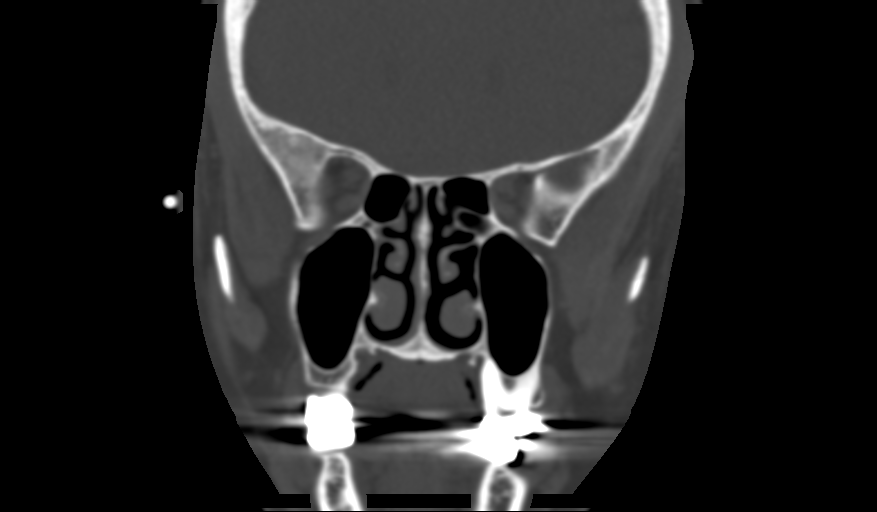
[im 39/71  bone]
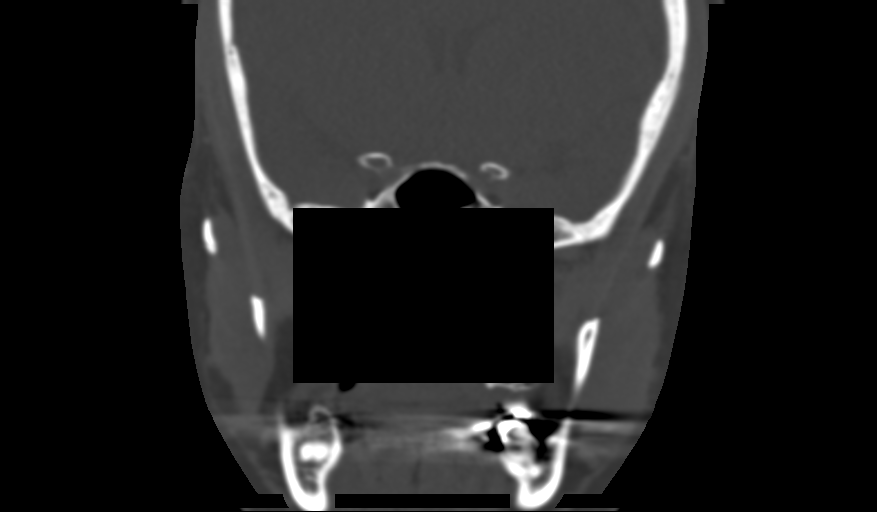

[Series 5: sagittal · sagittal · 0.27mm/px · 3 of 92 slices shown]
[im 31/92  bone]
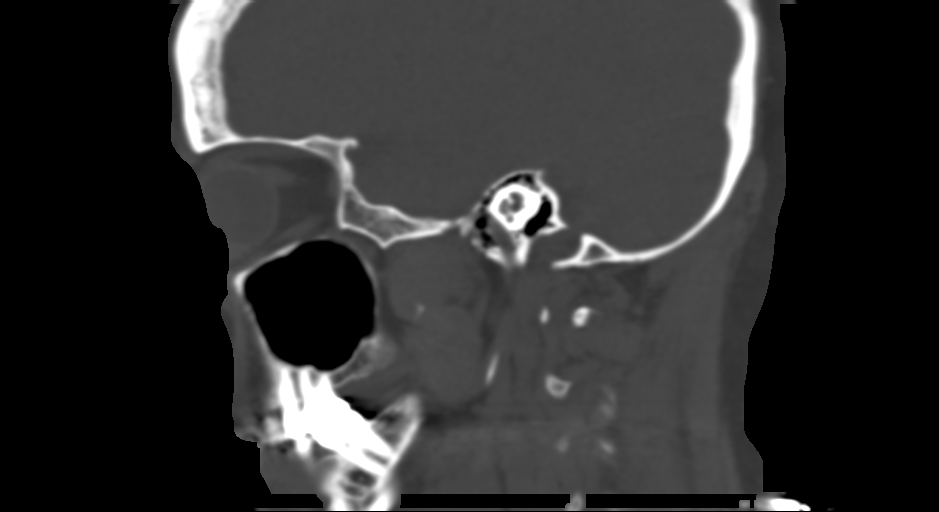
[im 46/92  bone]
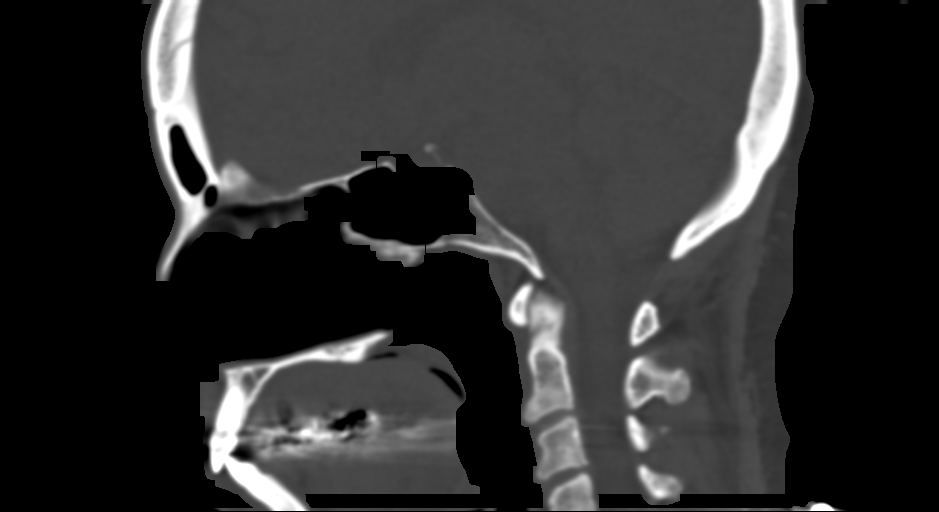
[im 61/92  bone]
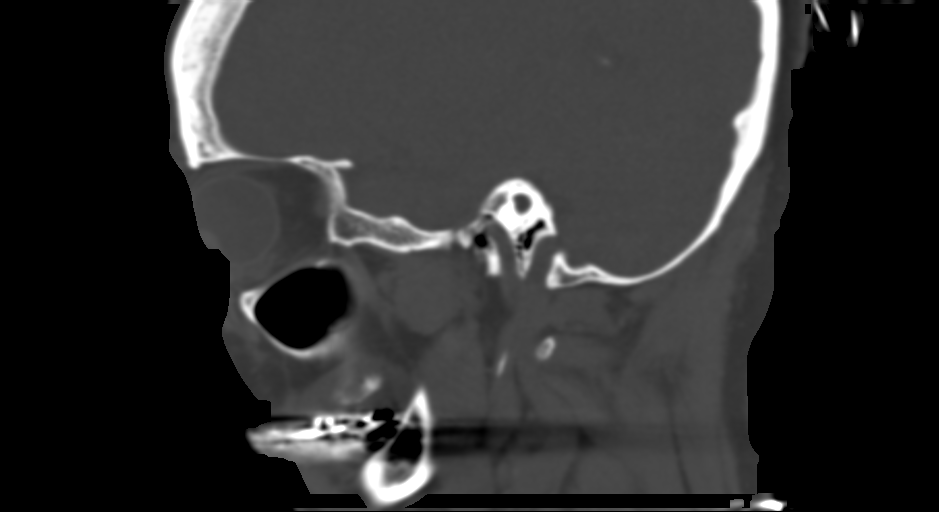

[13 of 47 positions shown; findings below may reference images not displayed]

FINDINGS: Paranasal sinuses:

Frontal: Normally aerated. Patent frontal sinus drainage pathways.

Ethmoid: Normally aerated.

Maxillary: Normally aerated.

Sphenoid: Tiny right sphenoid sinus mucous retention cyst. Otherwise
normally aerated.. Patent sphenoethmoidal recesses.

Right ostiomeatal unit: Patent.

Left ostiomeatal unit: Patent.

Nasal passages: Patent. Intact nasal septum. Minimal rightward nasal
septal deviation. Bilateral concha bullosa.

Anatomy: No pneumatization superior to anterior ethmoid notches.
Right fovea ethmoidalis is 3 mm higher than left, symmetric
olfactory grooves,, Keros II (4-7mm). Sellar sphenoid pneumatization
pattern.

Other: Ossification of right stylohyoid ligaments.
IMPRESSION: Normally aerated paranasal sinuses. Patent sinus drainage pathways.
Minimal rightward nasal septal deviation.

Incidental ossification of right stylohyoid ligament can be seen
with Yusniel syndrome in the appropriate clinical setting.

By: Germania Batt M.D.

## 2019-12-01 MED FILL — DILTIAZEM 24HR ER 240 MG CA: 240 | 90 days supply | Qty: 90 | Fill #0

## 2019-12-04 ENCOUNTER — Other Ambulatory Visit: Payer: Self-pay

## 2019-12-04 ENCOUNTER — Ambulatory Visit (INDEPENDENT_AMBULATORY_CARE_PROVIDER_SITE_OTHER): Payer: 59

## 2019-12-04 DIAGNOSIS — J309 Allergic rhinitis, unspecified: Secondary | ICD-10-CM | POA: Diagnosis not present

## 2019-12-04 NOTE — Progress Notes (Signed)
Immunotherapy   Patient Details  Name: Rose Delgado MRN: [id number] Date of Birth: [birth year]  12/04/2019  Lainey Teresita Madura here to get first injection and pick up new red vials. Patient received 0.05 of both her red vials. One with MOLDS-CR-DM and the other with RW-T-C Following schedule: A Frequency:1-2 times per week Epi-Pen:Epi-Pen Available   Consent signed, patient instructions given, injection log given.  Patient waited 30 minutes with no problem.   Herbie Drape 12/04/2019, 9:54 AM

## 2019-12-22 ENCOUNTER — Telehealth: Payer: 59 | Admitting: Cardiovascular Disease

## 2019-12-28 ENCOUNTER — Telehealth: Payer: Self-pay

## 2019-12-28 NOTE — Telephone Encounter (Signed)
Patient called and stated the injection she received today in her right arm (mold) at .20 raised to about 3cm, red, and painful. She states it always is very painful to point she wanted to stop getting the injection but she wanted to check with you to see if there's something else she can do to prevent the pain, redness, and swelling. She is taking her antihistamine before she gets her injections. She receives her injections at work.   Please advise.

## 2019-12-28 NOTE — Telephone Encounter (Signed)
Some suggestions include taking an antihistamine several hours before the injection and one later on in the evening of shot day (if it does not make her too sleepy) tylenol for pain and ice on the injection site. She could come into the clinic if they are less painful when she gets them here.

## 2019-12-29 NOTE — Telephone Encounter (Signed)
Thank you :)

## 2019-12-29 NOTE — Telephone Encounter (Signed)
Explained to patient about taking antihistamine before and after along with tylenol for pain and icing the injection site, she stated she has been doing all of that already. I offered her to come in the office to have her injections administered but she stated "If it hurts there it's gonna hurt here in the office". Patient scheduled an appointment to talk to someone because she is really thinking about stopping the injections because they hurt so bad.

## 2019-12-30 ENCOUNTER — Other Ambulatory Visit: Payer: Self-pay

## 2019-12-30 ENCOUNTER — Ambulatory Visit: Payer: 59 | Admitting: Allergy & Immunology

## 2019-12-30 ENCOUNTER — Encounter: Payer: Self-pay | Admitting: Allergy & Immunology

## 2019-12-30 VITALS — BP 140/80 | HR 73 | Resp 16

## 2019-12-30 DIAGNOSIS — J3089 Other allergic rhinitis: Secondary | ICD-10-CM

## 2019-12-30 DIAGNOSIS — J302 Other seasonal allergic rhinitis: Secondary | ICD-10-CM

## 2019-12-30 MED ORDER — EPINEPHRINE 0.3 MG/0.3ML IJ SOAJ: INTRAMUSCULAR | 2 refills | Status: DC

## 2019-12-30 NOTE — Progress Notes (Signed)
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 Allergic rhinitis - Continue cetirizine 10 mg once daily. - Continue Flonase 1 spray in each nostril twice daily as needed.  - Continue azelastine 2 sprays in each nostril twice daily as needed.  - Freeze the mold vial at 0.05 mL (I am going to talk to [facility] who mixes and bills for the shot vials to see when we can re-mix them next without the molds this time). - Continue with the advance of the other vial since you are tolerating it well.   2. Food allergy - Continue to avoid shellfish with the exception of shrimp.  - In case of an allergic reaction, take Benadryl 50 mg every 4 hours, and if life-threatening symptoms occur, inject with EpiPen 0.3 mg.  3. Return in about 6 months (around 06/30/2020). This can be an in-person, a virtual Webex or a telephone follow up visit.   Subjective:   Rose Delgado is a 59 y.o. female presenting today for follow up of  Chief Complaint  Patient presents with  . Allergic Rhinitis     Rose Delgado has a history of the following: Patient Active Problem List   Diagnosis Date Noted  . PMB (postmenopausal bleeding) 01/07/2019  . Encounter for gynecological examination with Papanicolaou smear of cervix 01/07/2019  . Screening for colorectal cancer 01/07/2019  . Seasonal and perennial allergic rhinitis 08/27/2018  . 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  . Pain in finger of right hand 06/27/2017  . Menorrhagia 05/28/2016  . Left hand pain 02/16/2016  . 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 Nausea with vomiting 07/14/2014  . Dysgeusia 03/11/2014  . GERD (gastroesophageal reflux disease) 03/11/2014  . Hot flashes  03/09/2014  . Perimenopause 03/09/2014  . Fibroids 03/09/2014    History obtained from: chart review and patient.  Rose Delgado is a 59 y.o. female presenting for a follow up visit. She was last seen in April 2021 by Webb Silversmith our NP. At that time, she was endorsing problems with her immunotherapy.  Specifically, she was having problems with the mold containing vial.  She receives her injections at work.  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  Even at this 0.05 mL, she will have large local reactions lasting 2 to 3 days.  She has never had a systemic reaction involving widespread hives or throat swelling.  She does premedicate with antihistamines as recommended.  At the last visit, the nurse practitioner gave several options including decreasing the allergen within the vial, removing some of the allergen from the vial, or stopping injections completely.  Since the last visit, she has not done well. She continues to have problems with the mold injections. She is getting them once weekly. This aggravates her for three days. She is at 0.2 mL. She was even getting large local reactions with the 0.05 mL dose of that vial. She has cockroach and dust mite with a couple of molds.  She has never had an anaphylactic reaction. The other one with the ragweed, trees and cat is fine.  She has reached 0.5 milliliters of that vial.  Otherwise, there have been no changes to her past medical history, surgical history, family history, or social history.  Review of Systems  Constitutional: Negative.  Negative for chills, fever, malaise/fatigue and weight loss.  HENT: Negative.  Negative for congestion, ear discharge and ear pain.   Eyes: Negative for pain, discharge and redness.  Respiratory: Negative for cough, sputum production, shortness of breath and wheezing.   Cardiovascular: Negative.  Negative for chest pain and palpitations.  Gastrointestinal: Negative for abdominal pain, constipation, diarrhea,  heartburn, nausea and vomiting.  Skin: Negative.  Negative for itching and rash.  Neurological: Negative for dizziness and headaches.  Endo/Heme/Allergies: Negative for environmental allergies. Does not bruise/bleed easily.       Objective:   Blood pressure 140/80, pulse 73, resp. rate 16, last menstrual period 07/29/2017, SpO2 98 %. There is no height or weight on file to calculate BMI.   Physical Exam:  Physical Exam Constitutional:      Appearance: She is well-developed.  HENT:     Head: Normocephalic and atraumatic.     Right Ear: Tympanic membrane, ear canal and external ear normal.     Left Ear: Tympanic membrane, ear canal and external ear normal.     Nose: No nasal deformity, septal deviation, mucosal edema or rhinorrhea.     Right Turbinates: Enlarged, swollen and pale.     Left Turbinates: Enlarged, swollen and pale.     Right Sinus: No maxillary sinus tenderness or frontal sinus tenderness.     Left Sinus: No maxillary sinus tenderness or frontal sinus tenderness.     Mouth/Throat:     Mouth: Mucous membranes are not pale and not dry.     Pharynx: Uvula midline.     Comments: Cobblestoning in the posterior oropharynx.  Eyes:     General:        Right eye: No discharge.        Left eye: No discharge.     Conjunctiva/sclera: Conjunctivae normal.     Right eye: Right conjunctiva is not injected. No chemosis.    Left eye: Left conjunctiva is not injected. No chemosis.    Pupils: Pupils are equal, round, and reactive to light.  Cardiovascular:     Rate and Rhythm: Normal rate and regular rhythm.     Heart sounds: Normal heart sounds.  Pulmonary:     Effort: Pulmonary effort is normal. No tachypnea, accessory muscle usage or respiratory distress.     Breath sounds: Normal breath sounds. No wheezing, rhonchi or rales.     Comments: Moving air well in all lung fields.  Chest:     Chest wall: No tenderness.  Lymphadenopathy:     Cervical: No cervical adenopathy.   Skin:    Coloration: Skin is not pale.     Findings: No abrasion, erythema, petechiae or rash. Rash is not papular, urticarial or vesicular.     Comments: No eczematous or urticarial lesions noted.   Neurological:     Mental Status: She is alert.      Diagnostic studies: none   Salvatore Marvel, MD  [facility]

## 2019-12-30 NOTE — Addendum Note (Signed)
Addended by: Herbie Drape on: 12/30/2019 06:31 PM   Modules accepted: Orders

## 2019-12-30 NOTE — Patient Instructions (Addendum)
1. Allergic rhinitis - Continue cetirizine 10 mg once daily. - Continue Flonase 1 spray in each nostril twice daily as needed.  - Continue azelastine 2 sprays in each nostril twice daily as needed.  - Freeze the mold vial at 0.05 mL (I am going to talk to [place] who mixes and bills for the shot vials to see when we can re-mix them next without the molds this time). - Continue with the advance of the other vial since you are tolerating it well.   2. Food allergy - Continue to avoid shellfish with the exception of shrimp.  - In case of an allergic reaction, take Benadryl 50 mg every 4 hours, and if life-threatening symptoms occur, inject with EpiPen 0.3 mg.  3. Return in about 6 months (around 06/30/2020). This can be an in-person, a virtual Webex or a telephone follow up visit.   Please inform us of any Emergency Department visits, hospitalizations, or changes in symptoms. Call us before going to the ED for breathing or allergy symptoms since we might be able to fit you in for a sick visit. Feel free to contact us anytime with any questions, problems, or concerns.  It was a pleasure to see you again today!  Websites that have reliable patient information: 1. American Academy of Asthma, Allergy, and Immunology: www.aaaai.org 2. Food Allergy Research and Education (FARE): foodallergy.org 3. Mothers of Asthmatics: http://www.asthmacommunitynetwork.org 4. American College of Allergy, Asthma, and Immunology: www.acaai.org   COVID-19 Vaccine Information can be found at: ShippingScam.co.uk For questions related to vaccine distribution or appointments, please email vaccine@[email or web address] .com or call [phone].     "Like" [place] on Facebook and Instagram for our latest updates!        Make sure you are registered to vote! If you have moved or changed any of your contact information, you will need to get this updated before voting!  In  some cases, you MAY be able to register to vote online: [email or web address]

## 2020-01-01 MED FILL — MONTELUKAST SOD 10 MG TAB: 10 | 90 days supply | Qty: 90 | Fill #1

## 2020-01-01 MED FILL — PANTOPRAZOLE SOD DR 40 MG T: 40 | 90 days supply | Qty: 90 | Fill #1

## 2020-01-14 ENCOUNTER — Telehealth: Payer: 59 | Admitting: Cardiovascular Disease

## 2020-01-15 ENCOUNTER — Ambulatory Visit: Payer: Self-pay

## 2020-01-28 DIAGNOSIS — H40023 Open angle with borderline findings, high risk, bilateral: Secondary | ICD-10-CM | POA: Diagnosis not present

## 2020-02-04 ENCOUNTER — Other Ambulatory Visit (HOSPITAL_COMMUNITY)
Admission: RE | Admit: 2020-02-04 | Discharge: 2020-02-04 | Disposition: A | Discharge status: Home / Self Care | Payer: 59 | Source: Ambulatory Visit | Attending: Family Medicine | Admitting: Family Medicine

## 2020-02-04 DIAGNOSIS — Z9001 Acquired absence of eye: Secondary | ICD-10-CM | POA: Insufficient documentation

## 2020-02-04 DIAGNOSIS — E782 Mixed hyperlipidemia: Secondary | ICD-10-CM | POA: Insufficient documentation

## 2020-02-04 LAB — COMPREHENSIVE METABOLIC PANEL
ALT: 52 U/L — ABNORMAL HIGH (ref 0–44)
AST: 37 U/L (ref 15–41)
Albumin: 4.7 g/dL (ref 3.5–5.0)
Alkaline Phosphatase: 127 U/L — ABNORMAL HIGH (ref 38–126)
Anion gap: 8 (ref 5–15)
BUN: 11 mg/dL (ref 6–20)
CO2: 25 mmol/L (ref 22–32)
Calcium: 9.8 mg/dL (ref 8.9–10.3)
Chloride: 103 mmol/L (ref 98–111)
Creatinine, Ser: 0.85 mg/dL (ref 0.44–1.00)
GFR calc Af Amer: >60 mL/min (ref >60)
GFR calc non Af Amer: >60 mL/min (ref >60)
Glucose, Bld: 95 mg/dL (ref 70–99)
Potassium: 3.7 mmol/L (ref 3.5–5.1)
Sodium: 136 mmol/L (ref 135–145)
Total Bilirubin: 0.2 mg/dL — ABNORMAL LOW (ref 0.3–1.2)
Total Protein: 8 g/dL (ref 6.5–8.1)

## 2020-02-04 LAB — LIPID PANEL
Cholesterol: 239 mg/dL — ABNORMAL HIGH (ref 0–200)
HDL: 44 mg/dL (ref >40)
LDL Cholesterol: 121 mg/dL — ABNORMAL HIGH (ref 0–99)
Total CHOL/HDL Ratio: 5.4 RATIO
Triglycerides: 372 mg/dL — ABNORMAL HIGH (ref <150)
VLDL: 74 mg/dL — ABNORMAL HIGH (ref 0–40)

## 2020-02-04 LAB — VITAMIN D 25 HYDROXY (VIT D DEFICIENCY, FRACTURES): Vit D, 25-Hydroxy: 82.17 ng/mL (ref 30–100)

## 2020-02-05 DIAGNOSIS — Z6824 Body mass index (BMI) 24.0-24.9, adult: Secondary | ICD-10-CM | POA: Diagnosis not present

## 2020-02-05 DIAGNOSIS — M791 Myalgia, unspecified site: Secondary | ICD-10-CM | POA: Diagnosis not present

## 2020-02-08 DIAGNOSIS — R05 Cough: Secondary | ICD-10-CM | POA: Diagnosis not present

## 2020-02-08 MED FILL — SM COMPLETE 50+ TABLET: 125 days supply | Qty: 125 | Fill #1

## 2020-02-09 NOTE — Progress Notes (Signed)
Virtual Visit via Telephone Note   This visit type was conducted due to national recommendations for restrictions regarding the COVID-19 Pandemic (e.g. social distancing) in an effort to limit this patient's exposure and mitigate transmission in our community.  Due to her co-morbid illnesses, this patient is at least at moderate risk for complications without adequate follow up.  This format is felt to be most appropriate for this patient at this time.  The patient did not have access to video technology/had technical difficulties with video requiring transitioning to audio format only (telephone).  All issues noted in this document were discussed and addressed.  No physical exam could be performed with this format.  Please refer to the patient's chart for her  consent to telehealth for [facility].   Date:  02/10/2020   ID:  Rose Delgado, DOB 06/30/1961, MRN [id number] The patient was identified using 2 identifiers.  Patient Location: Other:  Work Provider Location: Office/Clinic  PCP:  Caryl Bis, MD  Cardiologist:  Carlyle Dolly, MD  Electrophysiologist:  None   Evaluation Performed:  Follow-Up Visit  Chief Complaint:  Recent Sinus Congestion  History of Present Illness:    Rose Delgado is a 59 y.o. female with past medical history of palpitations, HTN and allergies who presents to the office today for an 49-monthfollow-up telehealth visit.  She most recently had a phone visit with Dr. KBronson Ingin 06/2019 and reported occasional palpitations at that time as she had been busy and doing housework and refinishing her basement.  She was continued on Cardizem CD 240 mg daily along with as needed short-acting Cardizem 30 mg to take as needed for breakthrough episodes.  In talking with the patient today, she reports having a productive cough with yellow phlegm and sinus congestion over the past week. She was tested for COVID-19 and negative. Was evaluated by her PCP and  started on Doxycycline. She has noticed improvement in her symptoms since.  She denies any recent palpitations. Reports having prior symptoms when on generic Cardizem CD in the past but has tolerated Cartia XT without side effects. She has not had to utilize short-acting Cardizem recently. Denies any recent chest pain or dyspnea on exertion. No recent orthopnea, PND or lower extremity edema. She was recently experiencing cramping along her lower extremities and took K-Dur for several days with improvement in symptoms. Nonfasting labs were obtained by her PCP and showed K+ was stable at 3.7 but her ALT was elevated at 52 and Alk phos was elevated to 127. FLP showed total cholesterol 239, triglycerides 372, HDL 44 and LDL 121. She is concerned about her elevated LFT's as her sister previously had biliary cancer and her brother had prostate cancer that metastasized to his biliary tree.   Past Medical History:  Diagnosis Date  . Allergic rhinitis   . Angio-edema   . Fibroids   . Irregular heart beat   . Migraines   . Perimenopause 03/09/2014  . Reflux   . Urticaria    Past Surgical History:  Procedure Laterality Date  . ADENOIDECTOMY    . BREAST BIOPSY Left 2008   benign  . COLONOSCOPY  06/2011   Dr. BBritta Mccreedy per patient it was normal, follow up in 10 years.   . ESOPHAGOGASTRODUODENOSCOPY     Dr. SBerneta Sages late 1990s/early 2000  . ESOPHAGOGASTRODUODENOSCOPY N/A 05/19/2014   SLF:NO OBVIOUS SOURCE FOR DYSGEUSIA IDENTIFED/MILD Non-erosive gastritis  . INCISION AND DRAINAGE Right 07/31/2017   Procedure: INCISION AND  DRAINAGE RIGHT THUMB NAIL BED;  Surgeon: Charlotte Crumb, MD;  Location: [place];  Service: Orthopedics;  Laterality: Right;  . INGUINAL HERNIA REPAIR    . KNEE SURGERY Right   . NAILBED REPAIR Right 07/31/2017   Procedure: NAILBED BIOPSY;  Surgeon: Charlotte Crumb, MD;  Location: [place];  Service: Orthopedics;  Laterality: Right;  .  TONSILLECTOMY    . TONSILLECTOMY AND ADENOIDECTOMY       Current Meds  Medication Sig  . azelastine (ASTELIN) 0.1 % nasal spray Place 2 sprays into both nostrils 2 (two) times daily for 30 days. Use in each nostril as directed  . Biotin (BIOTIN 5000) 5 MG CAPS Take 1 capsule by mouth daily.  . cetirizine (ZYRTEC) 10 MG tablet Take 1 tablet (10 mg total) by mouth 2 (two) times daily.  . cyclobenzaprine (FLEXERIL) 10 MG tablet Take 1 tablet by mouth every 8 (eight) hours as needed.  . diltiazem (CARTIA XT) 240 MG 24 hr capsule Take 1 capsule (240 mg total) by mouth daily.  Marland Kitchen EPINEPHrine 0.3 mg/0.3 mL IJ SOAJ injection Use as directed for severe allergic reaction.  . famotidine (PEPCID) 20 MG tablet Take 20 mg by mouth 2 (two) times daily.  . fluticasone (FLONASE) 50 MCG/ACT nasal spray Place 2 sprays into both nostrils 2 (two) times daily.  . hydrALAZINE (APRESOLINE) 25 MG tablet TAKE 1 TABLET BY MOUTH 2 TIMES DAILY.  . Magnesium 250 MG TABS Take 1 tablet by mouth daily.  . methocarbamol (ROBAXIN) 500 MG tablet Take 500 mg by mouth every 8 (eight) hours as needed.   . montelukast (SINGULAIR) 10 MG tablet Take 1 tablet (10 mg total) by mouth at bedtime.  . Multiple Vitamins-Minerals (SM COMPLETE 50+) TABS Take 1 tablet by mouth daily.  Marland Kitchen NEEDLE, DISP, 27 G (BD ECLIPSE NEEDLE) 27G X 1/2" MISC Use with allergy injection up to 2 times weekly as directed.  . Needle, Disp, 27G X 1" MISC Inject 0.5 mLs into the skin 2 (two) times a week. As directed  . ondansetron (ZOFRAN) 4 MG tablet Take 1 tablet (4 mg total) by mouth every 4 (four) hours as needed for nausea or vomiting.  . pantoprazole (PROTONIX) 40 MG tablet Take 1 tablet by mouth 2 (two) times daily.   . Polyethylene Glycol 3350 (MIRALAX PO) Take 17 g by mouth daily.  . rizatriptan (MAXALT-MLT) 10 MG disintegrating tablet Take 10 mg by mouth as needed for migraine. May repeat in 2 hours if needed  . SM CLEARLAX 17 GM/SCOOP powder Take 17 g by  mouth 2 (two) times daily.  . Tuberculin-Allergy Syringes 27G X 1/2" 1 ML MISC Use as directed for allergy injections.  . Vitamin D, Ergocalciferol, (DRISDOL) 1.25 MG (50000 UT) CAPS capsule Take 1 capsule by mouth once a week.  . vitamin E 100 UNIT capsule Take 100 Units by mouth daily.  . [DISCONTINUED] diltiazem (CARTIA XT) 240 MG 24 hr capsule Take 1 capsule (240 mg total) by mouth daily.     Allergies:   Metoprolol, Shellfish allergy, Flagyl [metronidazole], Lactose intolerance (gi), Losartan, Topamax [topiramate], Ciprofloxacin hcl, and Diflucan [fluconazole]   Social History   Tobacco Use  . Smoking status: Never Smoker  . Smokeless tobacco: Never Used  Vaping Use  . Vaping Use: Never used  Substance Use Topics  . Alcohol use: No  . Drug use: No     Family Hx: The patient's family history includes Cancer in her brother  and sister; Hypertension in her brother, brother, brother, mother, sister, sister, sister, and sister; Other in her father and mother; Sleep apnea in her brother. There is no history of Colon cancer, Allergic rhinitis, or Asthma.  ROS:   Please see the history of present illness.     All other systems reviewed and are negative.   Prior CV studies:   The following studies were reviewed today:  Echocardiogram: 08/2016 Study Conclusions   - Left ventricle: The cavity size was normal. Wall thickness was  normal. Systolic function was vigorous. The estimated ejection  fraction was in the range of 65% to 70%. Wall motion was normal;  there were no regional wall motion abnormalities. Left  ventricular diastolic function parameters were normal.  Event Monitor: 09/2017  Sinus rhythm was seen. No arrhythmias.   Labs/Other Tests and Data Reviewed:    EKG:  No ECG reviewed.  Recent Labs: 05/20/2019: Hemoglobin 14.0; Platelets 258; TSH 0.555 02/04/2020: ALT 52; BUN 11; Creatinine, Ser 0.85; Potassium 3.7; Sodium 136   Recent Lipid Panel Lab  Results  Component Value Date/Time   CHOL 239 (H) 02/04/2020 03:18 PM   TRIG 372 (H) 02/04/2020 03:18 PM   HDL 44 02/04/2020 03:18 PM   CHOLHDL 5.4 02/04/2020 03:18 PM   LDLCALC 121 (H) 02/04/2020 03:18 PM    Wt Readings from Last 3 Encounters:  02/10/20 142 lb (64.4 kg)  06/17/19 150 lb (68 kg)  03/20/19 146 lb (66.2 kg)     Objective:    Vital Signs:  BP 123/76   Pulse 76   Ht _0  (1.6 m)   Wt 142 lb (64.4 kg)   LMP 07/29/2017   BMI 25.15 kg/m    General: Pleasant female sounding in NAD Psych: Normal affect. Neuro: Alert and oriented X 3.  Lungs:  Resp regular and unlabored while talking on the phone.   ASSESSMENT & PLAN:    1. Palpitations - She has a history of palpitations which are presumably PAC's or PVC's as prior monitoring showed no significant arrhythmias. She reports her symptoms have overall been well controlled since her last visit and being restarted on Cartia XT. Continue Cartia XT 238m daily.  - She does have a history of hypokalemia and will continue on K-dur 20 mEq daily. K+ was 3.7 on 02/04/2020. Will recheck on follow-up labs in 1 month.   2. HTN - BP was well controlled at 123/76 on most recent check. She remains on Cartia XT 240 mg daily and Hydralazine 25 mg twice daily. She reports only taking this once daily at this time and BP has been well controlled at home. I did encourage her to check her readings in the evening hours to make sure she is not experiencing rebound hypertension as this is typically a BID or TID medication due to the short half-life.   3. Elevated LFT's - Recent CMET showed her ALT was elevated at 52 and Alk phos was elevated to 127. She does not consume alcohol. Denies any abdominal symptoms. She does report a family history of biliary cancer. Will plan to obtain a repeat CMET in 1 month. If still elevated, consider Abdominal UKorea   4. HLD - Followed by PCP. Recent FLP showed total cholesterol 239, glycerides 372, HDL 44 and LDL  121.  She was not fasting at that time which likely contributed to her elevated triglyceride reading. Not currently on statin therapy. Focus on dietary changes for now and if readings remain elevated by  follow-up labs, would need to consider statin therapy as her 10-year risk of CVD is 7.7%.    COVID-19 Education: The signs and symptoms of COVID-19 were discussed with the patient and how to seek care for testing (follow up with PCP or arrange E-visit).  The importance of social distancing was discussed today.  Time:   Today, I have spent 14 minutes with the patient with telehealth technology discussing the above problems.     Medication Adjustments/Labs and Tests Ordered: Current medicines are reviewed at length with the patient today.  Concerns regarding medicines are outlined above.   Tests Ordered: Orders Placed This Encounter  Procedures  . Comprehensive Metabolic Panel (CMET)    Medication Changes: Meds ordered this encounter  Medications  . diltiazem (CARTIA XT) 240 MG 24 hr capsule    Sig: Take 1 capsule (240 mg total) by mouth daily.    Dispense:  90 capsule    Refill:  3    09/15/19 cannot take generic diltiazem,causes breakthrough tachycardia    Order Specific Question:   Supervising Provider    Answer:   Dorothy Spark [[id number]]  . potassium chloride 20 MEQ TBCR    Sig: Take 20 mEq by mouth daily.    Dispense:  90 tablet    Refill:  3    Order Specific Question:   Supervising Provider    Answer:   Dorothy Spark [[id number]]    Follow Up:  In Person in 1 year(s)  Signed, Erma Heritage, PA-C  02/10/2020 4:50 PM    [place]

## 2020-02-10 ENCOUNTER — Encounter: Payer: Self-pay | Admitting: Student

## 2020-02-10 ENCOUNTER — Other Ambulatory Visit: Payer: Self-pay

## 2020-02-10 ENCOUNTER — Telehealth (INDEPENDENT_AMBULATORY_CARE_PROVIDER_SITE_OTHER): Payer: 59 | Admitting: Student

## 2020-02-10 VITALS — BP 123/76 | HR 76 | Ht 63.0 in | Wt 142.0 lb

## 2020-02-10 DIAGNOSIS — I10 Essential (primary) hypertension: Secondary | ICD-10-CM

## 2020-02-10 DIAGNOSIS — R002 Palpitations: Secondary | ICD-10-CM | POA: Diagnosis not present

## 2020-02-10 DIAGNOSIS — R7989 Other specified abnormal findings of blood chemistry: Secondary | ICD-10-CM | POA: Diagnosis not present

## 2020-02-10 DIAGNOSIS — E782 Mixed hyperlipidemia: Secondary | ICD-10-CM | POA: Diagnosis not present

## 2020-02-10 MED ORDER — DILTIAZEM HCL ER COATED BEADS 240 MG PO CP24: 240.0000 mg | ORAL_CAPSULE | Freq: Every day | ORAL | 3 refills | Status: DC

## 2020-02-10 MED ORDER — POTASSIUM CHLORIDE ER 20 MEQ PO TBCR: 20.0000 meq | EXTENDED_RELEASE_TABLET | Freq: Every day | ORAL | 3 refills | Status: DC

## 2020-02-10 MED FILL — POTASSIUM CHLORIDE CRYS ER: 20 | 90 days supply | Qty: 90 | Fill #0

## 2020-02-10 NOTE — Patient Instructions (Signed)
Medication Instructions:  Your physician has recommended you make the following change in your medication:  Start Potassium 20 mEq Daily   *If you need a refill on your cardiac medications before your next appointment, please call your pharmacy*   Lab Work: Your physician recommends that you return for lab work in: 1 Month ( 03-14-20)  If you have labs (blood work) drawn today and your tests are completely normal, you will receive your results only by: Marland Kitchen MyChart Message (if you have MyChart) OR . A paper copy in the mail If you have any lab test that is abnormal or we need to change your treatment, we will call you to review the results.   Testing/Procedures: NONE     Follow-Up: At [facility], you and your health needs are our priority.  As part of our continuing mission to provide you with exceptional heart care, we have created designated Provider Care Teams.  These Care Teams include your primary Cardiologist (physician) and Advanced Practice Providers (APPs -  Physician Assistants and Nurse Practitioners) who all work together to provide you with the care you need, when you need it.  We recommend signing up for the patient portal called "MyChart".  Sign up information is provided on this After Visit Summary.  MyChart is used to connect with patients for Virtual Visits (Telemedicine).  Patients are able to view lab/test results, encounter notes, upcoming appointments, etc.  Non-urgent messages can be sent to your provider as well.   To learn more about what you can do with MyChart, go to [email or web address].    Your next appointment:   1 year(s)  The format for your next appointment:   In Person  Provider:   Carlyle Dolly, MD or Bernerd Pho, PA-C   Other Instructions Thank you for choosing [facility]!

## 2020-02-25 DIAGNOSIS — H40021 Open angle with borderline findings, high risk, right eye: Secondary | ICD-10-CM | POA: Diagnosis not present

## 2020-02-29 ENCOUNTER — Encounter: Payer: Self-pay | Admitting: Family Medicine

## 2020-02-29 MED FILL — DILTIAZEM 24HR ER 240 MG CA: 240 | 90 days supply | Qty: 90 | Fill #1

## 2020-03-08 NOTE — Telephone Encounter (Signed)
This patient is interested in stopping allergen immunotherapy at this time due to pruritis despite aggressive medical therapy. We will send the discontinuation of injections form. Thank you

## 2020-03-10 DIAGNOSIS — H40022 Open angle with borderline findings, high risk, left eye: Secondary | ICD-10-CM | POA: Diagnosis not present

## 2020-03-16 ENCOUNTER — Other Ambulatory Visit (HOSPITAL_COMMUNITY)
Admission: RE | Admit: 2020-03-16 | Discharge: 2020-03-16 | Disposition: A | Discharge status: Home / Self Care | Payer: 59 | Source: Ambulatory Visit | Attending: Student | Admitting: Student

## 2020-03-16 ENCOUNTER — Other Ambulatory Visit: Payer: Self-pay

## 2020-03-16 DIAGNOSIS — R7989 Other specified abnormal findings of blood chemistry: Secondary | ICD-10-CM | POA: Insufficient documentation

## 2020-03-16 LAB — COMPREHENSIVE METABOLIC PANEL
ALT: 56 U/L — ABNORMAL HIGH (ref 0–44)
AST: 32 U/L (ref 15–41)
Albumin: 4.4 g/dL (ref 3.5–5.0)
Alkaline Phosphatase: 105 U/L (ref 38–126)
Anion gap: 9 (ref 5–15)
BUN: 15 mg/dL (ref 6–20)
CO2: 25 mmol/L (ref 22–32)
Calcium: 10.2 mg/dL (ref 8.9–10.3)
Chloride: 105 mmol/L (ref 98–111)
Creatinine, Ser: 0.77 mg/dL (ref 0.44–1.00)
GFR calc Af Amer: >60 mL/min (ref >60)
GFR calc non Af Amer: >60 mL/min (ref >60)
Glucose, Bld: 112 mg/dL — ABNORMAL HIGH (ref 70–99)
Potassium: 3.9 mmol/L (ref 3.5–5.1)
Sodium: 139 mmol/L (ref 135–145)
Total Bilirubin: 0.7 mg/dL (ref 0.3–1.2)
Total Protein: 7.8 g/dL (ref 6.5–8.1)

## 2020-03-24 DIAGNOSIS — H2000 Unspecified acute and subacute iridocyclitis: Secondary | ICD-10-CM | POA: Diagnosis not present

## 2020-04-08 ENCOUNTER — Other Ambulatory Visit: Payer: Self-pay | Admitting: Family Medicine

## 2020-04-08 MED FILL — PANTOPRAZOLE SOD DR 40 MG T: 40 | 90 days supply | Qty: 90 | Fill #0

## 2020-04-08 MED FILL — VIT D2 1.25 MG (50,000 UNIT: 1.25 MG | 84 days supply | Qty: 24 | Fill #0

## 2020-04-08 MED FILL — MONTELUKAST SOD 10 MG TAB: 10 | 90 days supply | Qty: 90 | Fill #0

## 2020-04-21 DIAGNOSIS — Z9889 Other specified postprocedural states: Secondary | ICD-10-CM | POA: Diagnosis not present

## 2020-04-21 DIAGNOSIS — H40023 Open angle with borderline findings, high risk, bilateral: Secondary | ICD-10-CM | POA: Diagnosis not present

## 2020-04-21 DIAGNOSIS — H20021 Recurrent acute iridocyclitis, right eye: Secondary | ICD-10-CM | POA: Diagnosis not present

## 2020-05-16 MED FILL — HYDRALAZINE HCL 25 MG TABS: 25 | 90 days supply | Qty: 180 | Fill #1

## 2020-05-16 MED FILL — CETIRIZINE HCL 10 MG TABS: 10 | 90 days supply | Qty: 90 | Fill #1

## 2020-05-19 DIAGNOSIS — H40023 Open angle with borderline findings, high risk, bilateral: Secondary | ICD-10-CM | POA: Diagnosis not present

## 2020-05-19 DIAGNOSIS — H2000 Unspecified acute and subacute iridocyclitis: Secondary | ICD-10-CM | POA: Diagnosis not present

## 2020-05-19 DIAGNOSIS — Z9889 Other specified postprocedural states: Secondary | ICD-10-CM | POA: Diagnosis not present

## 2020-05-30 MED FILL — DILTIAZEM 24HR ER 240 MG CA: 240 | 90 days supply | Qty: 90 | Fill #0

## 2020-05-31 DIAGNOSIS — Z7689 Persons encountering health services in other specified circumstances: Secondary | ICD-10-CM | POA: Diagnosis not present

## 2020-05-31 DIAGNOSIS — M779 Enthesopathy, unspecified: Secondary | ICD-10-CM | POA: Diagnosis not present

## 2020-05-31 DIAGNOSIS — Z6825 Body mass index (BMI) 25.0-25.9, adult: Secondary | ICD-10-CM | POA: Diagnosis not present

## 2020-06-01 ENCOUNTER — Ambulatory Visit (INDEPENDENT_AMBULATORY_CARE_PROVIDER_SITE_OTHER): Payer: 59 | Admitting: Orthopaedic Surgery

## 2020-06-01 ENCOUNTER — Other Ambulatory Visit: Payer: Self-pay

## 2020-06-01 ENCOUNTER — Encounter: Payer: Self-pay | Admitting: Orthopaedic Surgery

## 2020-06-01 VITALS — Ht 63.0 in | Wt 147.0 lb

## 2020-06-01 DIAGNOSIS — M7711 Lateral epicondylitis, right elbow: Secondary | ICD-10-CM | POA: Diagnosis not present

## 2020-06-01 DIAGNOSIS — M7712 Lateral epicondylitis, left elbow: Secondary | ICD-10-CM | POA: Diagnosis not present

## 2020-06-01 NOTE — Addendum Note (Signed)
Addended by: Lendon Collar on: 06/01/2020 02:30 PM   Modules accepted: Orders

## 2020-06-01 NOTE — Progress Notes (Signed)
Office Visit Note   Patient: Rose Delgado           Date of Birth: [birth year]           MRN: [id number] Visit Date: 06/01/2020              Requested by: Rose Bis, MD [place],  [place] [id number] PCP: Rose Bis, MD   Assessment & Plan: Visit Diagnoses:  1. Bilateral tennis elbow     Plan: Rose Delgado has evidence of bilateral tennis elbow with tenderness directly over the lateral epicondyles.  She is having more pain with grip and extension and flexion.  Primary care physician started a course of prednisone yesterday.  We will try a course of physical therapy and apply a left tennis elbow splint as it is the more symptomatic side.  Return in 2 weeks for reevaluation Follow-Up Instructions: Return in about 2 weeks (around 06/15/2020).   Orders:  Orders Placed This Encounter  Procedures  . Ambulatory referral to Physical Therapy   No orders of the defined types were placed in this encounter.     Procedures: No procedures performed   Clinical Data: No additional findings.   Subjective: Chief Complaint  Patient presents with  . Left Elbow - Pain  . Right Elbow - Pain  Patient presents today for bilateral elbow pain. She said that they have hurt for years. No known injury. Her left elbow is worse than the right. The pain is worse medially and laterally and radiates up and down her arms. She said that it feels better if she applies pressure below her elbow. The pain is worse with any use. She is currently on oral prednisone and states it has helped some. She is right hand dominant. 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 She does not take anything for pain.  Her family physician started a course of prednisone yesterday 40 mg a day for 5 days.  Feeling better today and not as symptomatic no related numbness or tingling.  Pain seems to be localized along the lateral aspect of both elbows.  More trouble on the left than the  right  HPI  Review of Systems   Objective: Vital Signs: Ht 5\' 3"  (1.6 m)   Wt 147 lb (66.7 kg)   LMP 07/29/2017   BMI 26.04 kg/m   Physical Exam Constitutional:      Appearance: She is well-developed.  Eyes:     Pupils: Pupils are equal, round, and reactive to light.  Pulmonary:     Effort: Pulmonary effort is normal.  Skin:    General: Skin is warm and dry.  Neurological:     Mental Status: She is alert and oriented to person, place, and time.  Psychiatric:        Behavior: Behavior normal.     Ortho Exam awake alert and oriented x3.  Comfortable sitting.  Has localized tenderness over the lateral epicondyles of both elbows.  Full range of motion in pronation supination flexion extension bilaterally.  No wrist pain.  Neurologically intact.  Has pain with grip and extension over the lateral epicondyles both elbows and not in flexion.  Specialty Comments:  No specialty comments available.  Imaging: No results found.   PMFS History: Patient Active Problem List   Diagnosis Date Noted  . Bilateral tennis elbow 06/01/2020  . PMB (postmenopausal bleeding) 01/07/2019  . Encounter for gynecological examination  with Papanicolaou smear of cervix 01/07/2019  . Screening for colorectal cancer 01/07/2019  . Seasonal and perennial allergic rhinitis 08/27/2018  . 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  . Pain in finger of right hand 06/27/2017  . Menorrhagia 05/28/2016  . Left hand pain 02/16/2016  . 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 Nausea with vomiting 07/14/2014  . Dysgeusia 03/11/2014  . GERD (gastroesophageal reflux disease) 03/11/2014  . Hot flashes 03/09/2014  . Perimenopause 03/09/2014  . Fibroids 03/09/2014   Past Medical History:  Diagnosis Date  . Allergic rhinitis   . Angio-edema   . Fibroids   . Irregular heart beat   . Migraines   . Perimenopause 03/09/2014  . Reflux   . Urticaria     Family History    Problem Relation Age of Onset  . Other Mother        glaucoma; pacemaker  . Hypertension Mother   . Other Father        aneursym  . Cancer Sister        biliary, deceased age 18  . Hypertension Sister   . Cancer Brother        lung  . Hypertension Brother   . Sleep apnea Brother   . Hypertension Brother   . Hypertension Brother   . Hypertension Sister   . Hypertension Sister   . Hypertension Sister   . Colon cancer Neg Hx   . Allergic rhinitis Neg Hx   . Asthma Neg Hx     Past Surgical History:  Procedure Laterality Date  . ADENOIDECTOMY    . BREAST BIOPSY Left 2008   benign  . COLONOSCOPY  06/2011   Dr. Britta Mccreedy: per patient it was normal, follow up in 10 years.   . ESOPHAGOGASTRODUODENOSCOPY     Dr. Berneta Sages: late 1990s/early 2000  . ESOPHAGOGASTRODUODENOSCOPY N/A 05/19/2014   SLF:NO OBVIOUS SOURCE FOR DYSGEUSIA IDENTIFED/MILD Non-erosive gastritis  . INCISION AND DRAINAGE Right 07/31/2017   Procedure: INCISION AND DRAINAGE RIGHT THUMB NAIL BED;  Surgeon: Charlotte Crumb, MD;  Location: [place];  Service: Orthopedics;  Laterality: Right;  . INGUINAL HERNIA REPAIR    . KNEE SURGERY Right   . NAILBED REPAIR Right 07/31/2017   Procedure: NAILBED BIOPSY;  Surgeon: Charlotte Crumb, MD;  Location: [place];  Service: Orthopedics;  Laterality: Right;  . TONSILLECTOMY    . TONSILLECTOMY AND ADENOIDECTOMY     Social History   Occupational History  . 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 Smoking status: Never Smoker  . Smokeless tobacco: Never Used  Vaping Use  . Vaping Use: Never used  Substance and Sexual Activity  . Alcohol use: No  . Drug use: No  . Sexual activity: Yes    Birth control/protection: None, Post-menopausal

## 2020-06-02 ENCOUNTER — Other Ambulatory Visit: Payer: Self-pay

## 2020-06-02 ENCOUNTER — Telehealth: Payer: Self-pay | Admitting: Orthopaedic Surgery

## 2020-06-02 DIAGNOSIS — E559 Vitamin D deficiency, unspecified: Secondary | ICD-10-CM | POA: Diagnosis not present

## 2020-06-02 DIAGNOSIS — J301 Allergic rhinitis due to pollen: Secondary | ICD-10-CM | POA: Diagnosis not present

## 2020-06-02 DIAGNOSIS — G43909 Migraine, unspecified, not intractable, without status migrainosus: Secondary | ICD-10-CM | POA: Diagnosis not present

## 2020-06-02 DIAGNOSIS — M7711 Lateral epicondylitis, right elbow: Secondary | ICD-10-CM

## 2020-06-02 DIAGNOSIS — I10 Essential (primary) hypertension: Secondary | ICD-10-CM | POA: Diagnosis not present

## 2020-06-02 DIAGNOSIS — Z6825 Body mass index (BMI) 25.0-25.9, adult: Secondary | ICD-10-CM | POA: Diagnosis not present

## 2020-06-02 DIAGNOSIS — E782 Mixed hyperlipidemia: Secondary | ICD-10-CM | POA: Diagnosis not present

## 2020-06-02 NOTE — Telephone Encounter (Signed)
Crystal with [facility] called asking that we change the pts referral from PT to OT since the pt is doing therapy on her elbow, crystal states the therapist can only do it as OT.  [place] CB# [phone]

## 2020-06-02 NOTE — Telephone Encounter (Signed)
New order has been placed. 

## 2020-06-08 ENCOUNTER — Ambulatory Visit (HOSPITAL_COMMUNITY): Payer: 59

## 2020-06-15 ENCOUNTER — Ambulatory Visit: Payer: 59 | Admitting: Orthopaedic Surgery

## 2020-06-15 ENCOUNTER — Ambulatory Visit (INDEPENDENT_AMBULATORY_CARE_PROVIDER_SITE_OTHER): Payer: 59 | Admitting: Orthopaedic Surgery

## 2020-06-15 ENCOUNTER — Encounter: Payer: Self-pay | Admitting: Orthopaedic Surgery

## 2020-06-15 ENCOUNTER — Other Ambulatory Visit: Payer: Self-pay

## 2020-06-15 VITALS — Ht 63.0 in | Wt 147.0 lb

## 2020-06-15 DIAGNOSIS — M7712 Lateral epicondylitis, left elbow: Secondary | ICD-10-CM | POA: Diagnosis not present

## 2020-06-15 DIAGNOSIS — M7711 Lateral epicondylitis, right elbow: Secondary | ICD-10-CM | POA: Diagnosis not present

## 2020-06-15 MED ORDER — LIDOCAINE HCL 1 % IJ SOLN
1.0000 mL | INTRAMUSCULAR | Status: AC | PRN
  Administered 2020-06-15: 1 mL

## 2020-06-15 NOTE — Progress Notes (Signed)
Office Visit Note   Patient: Rose Delgado           Date of Birth: 59-May-1962           MRN: [id number] Visit Date: 06/15/2020              Requested by: Caryl Bis, MD [place],  [place] [address] PCP: Caryl Bis, MD   Assessment & Plan: Visit Diagnoses:  1. Bilateral tennis elbow     Plan: [name] has finished her course of prednisone and relates that her left tennis elbow is little bit better.  She is wearing her tennis elbow splint.  Still having some compromise of activities particularly with grip and extension.  We have discussed treatment options and she like to have a cortisone injection.  This was performed without difficulty.  She would prefer not to go to physical therapy Follow-Up Instructions: Return if symptoms worsen or fail to improve.   Orders:  Orders Placed This Encounter  Procedures  . Hand/UE Inj   No orders of the defined types were placed in this encounter.     Procedures: Hand/UE Inj for lateral epicondylitis on 06/15/2020 9:54 AM Details: 27 G needle, lateral approach Medications: 1 mL lidocaine 1 %  1 mL betamethasone injected with Xylocaine      Clinical Data: No additional findings.   Subjective: Chief Complaint  Patient presents with  . Right Elbow - Follow-up  . Left Elbow - Follow-up  Patient presents today for a two week follow up on her elbows. She was diagnosed with tennis elbow bilaterally. She said that since her last visit she has finished her prednisone. She is wearing her elbow strap on the left side. She was suppose to start physical therapy this week, but had to cancel due to an unexpected death of a friend. She said that she has noticed much improvement since her last visit. She is not taking anything for pain.   HPI  Review of Systems   Objective: Vital Signs: Ht 5\' 3"  (1.6 m)   Wt 147 lb (66.7 kg)   LMP 07/29/2017   BMI 26.04 kg/m   Physical Exam Constitutional:      Appearance: She is  well-developed and well-nourished.  HENT:     Mouth/Throat:     Mouth: Oropharynx is clear and moist.  Eyes:     Extraocular Movements: EOM normal.     Pupils: Pupils are equal, round, and reactive to light.  Pulmonary:     Effort: Pulmonary effort is normal.  Skin:    General: Skin is warm and dry.  Neurological:     Mental Status: She is alert and oriented to person, place, and time.  Psychiatric:        Mood and Affect: Mood and affect normal.        Behavior: Behavior normal.     Ortho Exam awake alert and oriented x3.  Comfortable sitting.  Still has some tenderness over the lateral epicondyles left elbow with pain in extension.  Skin intact.  No erythema or ecchymosis.  Neurologically intact  Specialty Comments:  No specialty comments available.  Imaging: No results found.   PMFS History: Patient Active Problem List   Diagnosis Date Noted  . Bilateral tennis elbow 06/01/2020  . PMB (postmenopausal bleeding) 01/07/2019  . Encounter for gynecological examination with Papanicolaou smear of cervix 01/07/2019  . Screening for colorectal cancer 01/07/2019  . Seasonal and perennial allergic rhinitis  08/27/2018  . 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  . Pain in finger of right hand 06/27/2017  . Menorrhagia 05/28/2016  . Left hand pain 02/16/2016  . 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 Nausea with vomiting 07/14/2014  . Dysgeusia 03/11/2014  . GERD (gastroesophageal reflux disease) 03/11/2014  . Hot flashes 03/09/2014  . Perimenopause 03/09/2014  . Fibroids 03/09/2014   Past Medical History:  Diagnosis Date  . Allergic rhinitis   . Angio-edema   . Fibroids   . Irregular heart beat   . Migraines   . Perimenopause 03/09/2014  . Reflux   . Urticaria     Family History  Problem Relation Age of Onset  . Other Mother        glaucoma; pacemaker  . Hypertension Mother   . Other Father        aneursym  . Cancer  Sister        biliary, deceased age 28  . Hypertension Sister   . Cancer Brother        lung  . Hypertension Brother   . Sleep apnea Brother   . Hypertension Brother   . Hypertension Brother   . Hypertension Sister   . Hypertension Sister   . Hypertension Sister   . Colon cancer Neg Hx   . Allergic rhinitis Neg Hx   . Asthma Neg Hx     Past Surgical History:  Procedure Laterality Date  . ADENOIDECTOMY    . BREAST BIOPSY Left 2008   benign  . COLONOSCOPY  06/2011   Dr. Britta Mccreedy: per patient it was normal, follow up in 10 years.   . ESOPHAGOGASTRODUODENOSCOPY     Dr. Berneta Sages: late 1990s/early 2000  . ESOPHAGOGASTRODUODENOSCOPY N/A 05/19/2014   SLF:NO OBVIOUS SOURCE FOR DYSGEUSIA IDENTIFED/MILD Non-erosive gastritis  . INCISION AND DRAINAGE Right 07/31/2017   Procedure: INCISION AND DRAINAGE RIGHT THUMB NAIL BED;  Surgeon: Charlotte Crumb, MD;  Location: [facility];  Service: Orthopedics;  Laterality: Right;  . INGUINAL HERNIA REPAIR    . KNEE SURGERY Right   . NAILBED REPAIR Right 07/31/2017   Procedure: NAILBED BIOPSY;  Surgeon: Charlotte Crumb, MD;  Location: [facility];  Service: Orthopedics;  Laterality: Right;  . TONSILLECTOMY    . TONSILLECTOMY AND ADENOIDECTOMY     Social History   Occupational History  . Occupation: Therapist, sports at [employer]  Tobacco Use  . Smoking status: Never Smoker  . Smokeless tobacco: Never Used  Vaping Use  . Vaping Use: Never used  Substance and Sexual Activity  . Alcohol use: No  . Drug use: No  . Sexual activity: Yes    Birth control/protection: None, Post-menopausal

## 2020-06-17 ENCOUNTER — Other Ambulatory Visit (HOSPITAL_COMMUNITY): Payer: Self-pay | Admitting: Family Medicine

## 2020-06-17 ENCOUNTER — Other Ambulatory Visit: Payer: Self-pay

## 2020-06-17 ENCOUNTER — Ambulatory Visit (HOSPITAL_COMMUNITY)
Admission: RE | Admit: 2020-06-17 | Discharge: 2020-06-17 | Disposition: A | Discharge status: Home / Self Care | Payer: 59 | Source: Ambulatory Visit | Attending: Family Medicine | Admitting: Family Medicine

## 2020-06-17 DIAGNOSIS — Z1231 Encounter for screening mammogram for malignant neoplasm of breast: Secondary | ICD-10-CM

## 2020-07-03 MED FILL — FAMOTIDINE 20 MG TABLET: 20 | 90 days supply | Qty: 180 | Fill #1

## 2020-07-04 MED FILL — SM COMPLETE 50+ TABLET: 125 days supply | Qty: 125 | Fill #2

## 2020-07-08 ENCOUNTER — Other Ambulatory Visit (HOSPITAL_COMMUNITY): Payer: Self-pay | Admitting: Ophthalmology

## 2020-07-08 MED FILL — LATANOPROST 0.005% EYE DRP: 0.005 | 25 days supply | Qty: 3 | Fill #0

## 2020-07-11 ENCOUNTER — Telehealth: Payer: Self-pay | Admitting: Family Medicine

## 2020-07-11 MED FILL — MONTELUKAST SOD 10 MG TAB: 10 | 90 days supply | Qty: 90 | Fill #1

## 2020-07-11 MED FILL — PANTOPRAZOLE SOD DR 40 MG T: 40 | 90 days supply | Qty: 90 | Fill #1

## 2020-07-11 NOTE — Telephone Encounter (Signed)
Agree with plan ° °J Zameer Borman MD °

## 2020-07-11 NOTE — Telephone Encounter (Signed)
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 had COVID and since then has SOB .  Appointment given for 07/14/2020 with Katina Dung NP

## 2020-07-11 NOTE — Telephone Encounter (Signed)
Advised if symptoms get wore between now and Thursday to get ED evaluation. Verbalized understanding of plan.

## 2020-07-13 NOTE — Progress Notes (Unsigned)
Cardiology Office Note  Date: 07/14/2020   ID: SHARIFA BUCHOLZ, DOB March 24, 1961, MRN [id number]  PCP:  Caryl Bis, MD  Cardiologist:  Carlyle Dolly, MD Electrophysiologist:  None   Chief Complaint: Palpitations/ PVCs shortness of breath  History of Present Illness: Rose Delgado is a 60 y.o. female with a history of palpitations /PVC's, SOB, back pain, s/p covid.   Last saw Bernerd Pho, [place] via telemedicine 02/10/2020. She was continuing Cardizem CD 240 mg daily and as needed Cardizem 30 mg for breakthrough palpitations. She had not used prn cardizem. Her LFT's were elevated and she was concerned due to sister with hx of biliary CA. CMET was ordered and if LFT's remained elevated would order a Abdominal US. BP was well controlled on Cartia XT 240 mg and Hydralazine 25 mg po bid. She was only taking Hydralazine once daily. She was encouraged to check BP in the evenings to make sure BP was not elevating.   She is here today with complaints of increasing palpitations. Also complaining of shortness of breath and some back pain which apparently started around 2 months ago. No aggravating or alleviating factors. She takes Flexeril and Robaxin with some relief. States she has been experiencing more palpitations recently. States her shortness of breath has been chronic since her bout with COVID in 2020. She denies any CVA or TIA-like symptoms, orthostatic symptoms, PND, orthopnea. Denies any bleeding. Denies any claudication-like symptoms, DVT or PE-like symptoms, lower extremity edema.  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  She states some of the back pain may be related to stress but she is not sure.   Past Medical History:  Diagnosis Date  . Allergic rhinitis   . Angio-edema   . Fibroids   . Irregular heart beat   . Migraines   . Perimenopause 03/09/2014  . Reflux   . Urticaria     Past Surgical History:  Procedure Laterality Date  . ADENOIDECTOMY    .  BREAST BIOPSY Left 2008   benign  . COLONOSCOPY  06/2011   Dr. Britta Mccreedy: per patient it was normal, follow up in 10 years.   . ESOPHAGOGASTRODUODENOSCOPY     Dr. Berneta Sages: late 1990s/early 2000  . ESOPHAGOGASTRODUODENOSCOPY N/A 05/19/2014   SLF:NO OBVIOUS SOURCE FOR DYSGEUSIA IDENTIFED/MILD Non-erosive gastritis  . INCISION AND DRAINAGE Right 07/31/2017   Procedure: INCISION AND DRAINAGE RIGHT THUMB NAIL BED;  Surgeon: Charlotte Crumb, MD;  Location: [facility];  Service: Orthopedics;  Laterality: Right;  . INGUINAL HERNIA REPAIR    . KNEE SURGERY Right   . NAILBED REPAIR Right 07/31/2017   Procedure: NAILBED BIOPSY;  Surgeon: Charlotte Crumb, MD;  Location: [facility];  Service: Orthopedics;  Laterality: Right;  . TONSILLECTOMY    . TONSILLECTOMY AND ADENOIDECTOMY      Current Outpatient Medications  Medication Sig Dispense Refill  . Biotin 5 MG CAPS Take 1 capsule by mouth daily.    . cetirizine (ZYRTEC) 10 MG tablet Take 1 tablet (10 mg total) by mouth 2 (two) times daily. 180 tablet 1  . cyclobenzaprine (FLEXERIL) 10 MG tablet Take 1 tablet by mouth every 8 (eight) hours as needed.  0  . diltiazem (CARTIA XT) 240 MG 24 hr capsule Take 1 capsule (240 mg total) by mouth daily. 90 capsule 3  . EPINEPHrine 0.3 mg/0.3 mL IJ SOAJ injection Use as directed for severe allergic reaction. 1 each 2  . famotidine (  PEPCID) 20 MG tablet Take 20 mg by mouth 2 (two) times daily.    . fluticasone (FLONASE) 50 MCG/ACT nasal spray Place 2 sprays into both nostrils 2 (two) times daily. 48 g 1  . hydrALAZINE (APRESOLINE) 25 MG tablet TAKE 1 TABLET BY MOUTH 2 TIMES DAILY. 180 tablet 3  . Magnesium 250 MG TABS Take 1 tablet by mouth daily.    . methocarbamol (ROBAXIN) 500 MG tablet Take 500 mg by mouth every 8 (eight) hours as needed.     . montelukast (SINGULAIR) 10 MG tablet TAKE 1 TABLET BY MOUTH AT BEDTIME 90 tablet 1  . Multiple Vitamins-Minerals (SM COMPLETE 50+) TABS  Take 1 tablet by mouth daily.    Marland Kitchen NEEDLE, DISP, 27 G (BD ECLIPSE NEEDLE) 27G X 1/2" MISC Use with allergy injection up to 2 times weekly as directed. 50 each 12  . Needle, Disp, 27G X 1" MISC Inject 0.5 mLs into the skin 2 (two) times a week. As directed 50 each 12  . ondansetron (ZOFRAN) 4 MG tablet Take 1 tablet (4 mg total) by mouth every 4 (four) hours as needed for nausea or vomiting. 30 tablet 2  . pantoprazole (PROTONIX) 40 MG tablet Take 1 tablet by mouth 2 (two) times daily.     . Polyethylene Glycol 3350 (MIRALAX PO) Take 17 g by mouth daily.    . potassium chloride 20 MEQ TBCR Take 20 mEq by mouth daily. 90 tablet 3  . rizatriptan (MAXALT-MLT) 10 MG disintegrating tablet Take 10 mg by mouth as needed for migraine. May repeat in 2 hours if needed    . SM CLEARLAX 17 GM/SCOOP powder Take 17 g by mouth 2 (two) times daily.    . Tuberculin-Allergy Syringes 27G X 1/2" 1 ML MISC Use as directed for allergy injections. 100 each 5  . Vitamin D, Ergocalciferol, (DRISDOL) 1.25 MG (50000 UT) CAPS capsule Take 1 capsule by mouth once a week.    . vitamin E 100 UNIT capsule Take 100 Units by mouth daily.     No current facility-administered medications for this visit.   Allergies:  Metoprolol, Shellfish allergy, Flagyl [metronidazole], Lactose intolerance (gi), Losartan, Topamax [topiramate], Ciprofloxacin hcl, Diflucan [fluconazole], and Latex   Social History: The patient  reports that she has never smoked. She has never used smokeless tobacco. She reports that she does not drink alcohol and does not use drugs.   Family History: The patient's family history includes Cancer in her brother and sister; Hypertension in her brother, brother, brother, mother, sister, sister, sister, and sister; Other in her father and mother; Sleep apnea in her brother.   ROS:  Please see the history of present illness. Otherwise, complete review of systems is positive for none.  All other systems are reviewed and  negative.   Physical Exam: VS:  BP 138/84   Pulse 63   Ht 5\' 3"  (1.6 m)   Wt 149 lb 9.6 oz (67.9 kg)   LMP 07/29/2017   SpO2 99%   BMI 26.50 kg/m , BMI Body mass index is 26.5 kg/m.  Wt Readings from Last 3 Encounters:  07/14/20 149 lb 9.6 oz (67.9 kg)  06/15/20 147 lb (66.7 kg)  06/01/20 147 lb (66.7 kg)    General: Patient appears comfortable at rest. Neck: Supple, no elevated JVP or carotid bruits, no thyromegaly. Lungs: Clear to auscultation, nonlabored breathing at rest. Cardiac: Regular rate and rhythm, no S3 or significant systolic murmur, no pericardial rub. Extremities: No  pitting edema, distal pulses 2+. Skin: Warm and dry. Musculoskeletal: No kyphosis. Neuropsychiatric: Alert and oriented x3, affect grossly appropriate.  ECG:  An ECG dated 07/14/2020 was personally reviewed today and demonstrated:  Normal sinus rhythm rate of 63, minimal voltage criteria for LVH nonspecific ST and T wave abnormality.  Recent Labwork: 03/16/2020: ALT 56; AST 32; BUN 15; Creatinine, Ser 0.77; Potassium 3.9; Sodium 139     Component Value Date/Time   CHOL 239 (H) 02/04/2020 1518   TRIG 372 (H) 02/04/2020 1518   HDL 44 02/04/2020 1518   CHOLHDL 5.4 02/04/2020 1518   VLDL 74 (H) 02/04/2020 1518   LDLCALC 121 (H) 02/04/2020 1518    Other Studies Reviewed Today:  Cardiac monitor 10/25/2017 Summary: The patient's monitoring period was 10/24/2017 - 11/13/2017. Baseline sample showed Sinus Rhythm with a heart rate of 91.1 bpm. There were 0 critical, 0 serious, and 2 stable events that occurred. The report analysis of the critical, serious, stable and manually triggered events are listed below. Automatically Detected Events: Manually Detected Events: 2 Stable: Sinus Rhythm    Echocardiogram 09/12/2016  Study Conclusions  - Left ventricle: The cavity size was normal. Wall thickness was  normal. Systolic function was vigorous. The estimated ejection  fraction was in the range of  65% to 70%. Wall motion was normal;  there were no regional wall motion abnormalities. Left  ventricular diastolic function parameters were normal.    Assessment and Plan:  1. Shortness of breath   2. Palpitations   3. Back pain, unspecified back location, unspecified back pain laterality, unspecified chronicity   4. Essential hypertension    1. Shortness of breath Describes having shortness of breath since she had COVID pneumonia back in 2020. States she becomes winded just going up stairs and more than usual activities. Please get an echocardiogram to evaluate LV function, diastolic function and valvular function.  2. Palpitations Complaining of increasing palpitations in spite of taking cardiology 240 mg p.o. daily as well as as needed Cardizem 30 mg p.o. twice daily. Increased dose of diltiazem to 300 mg daily.  3. Back pain, unspecified back location, unspecified back pain laterality, unspecified chronicity Describes some back pain which radiates from back on the left side up to the front sometimes. States it is not associated with activity. States that occurs at any time no aggravating or alleviating factors. She takes Flexeril and Robaxin for relief. She is concerned it may be stress related. States it seems to hurt more when she knows she has to go into work. 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  4. Essential hypertension Blood pressure today 138/84. Continue hydralazine 25 mg p.o. twice daily. May need to adjust in the future if blood pressure remains elevated.  Medication Adjustments/Labs and Tests Ordered: Current medicines are reviewed at length with the patient today.  Concerns regarding medicines are outlined above.   Disposition: Follow-up with [name] or APP 4 to 6 weeks.  Signed, Levell July, NP 07/14/2020 9:51 AM    [facility] [place], [place],  [address] Phone: [phone];  Fax: [phone]

## 2020-07-14 ENCOUNTER — Encounter: Payer: Self-pay | Admitting: Family Medicine

## 2020-07-14 ENCOUNTER — Ambulatory Visit (INDEPENDENT_AMBULATORY_CARE_PROVIDER_SITE_OTHER): Payer: 59 | Admitting: Family Medicine

## 2020-07-14 ENCOUNTER — Other Ambulatory Visit: Payer: Self-pay | Admitting: Family Medicine

## 2020-07-14 VITALS — BP 138/84 | HR 63 | Ht 63.0 in | Wt 149.6 lb

## 2020-07-14 DIAGNOSIS — R002 Palpitations: Secondary | ICD-10-CM | POA: Diagnosis not present

## 2020-07-14 DIAGNOSIS — I10 Essential (primary) hypertension: Secondary | ICD-10-CM

## 2020-07-14 DIAGNOSIS — R0602 Shortness of breath: Secondary | ICD-10-CM | POA: Diagnosis not present

## 2020-07-14 DIAGNOSIS — M549 Dorsalgia, unspecified: Secondary | ICD-10-CM | POA: Diagnosis not present

## 2020-07-14 MED ORDER — CARDIZEM 30 MG PO TABS: 30.0000 mg | ORAL_TABLET | Freq: Two times a day (BID) | ORAL | 6 refills | Status: DC | PRN

## 2020-07-14 MED ORDER — DILTIAZEM HCL ER COATED BEADS 300 MG PO CP24: 300.0000 mg | ORAL_CAPSULE | Freq: Every day | ORAL | 6 refills | Status: DC

## 2020-07-14 MED FILL — DILTIAZEM 24HR ER 300 MG CA: 300 | 30 days supply | Qty: 30 | Fill #0

## 2020-07-14 MED FILL — dilTIAZem HCL 30 MG TABS: 30 | 15 days supply | Qty: 30 | Fill #0

## 2020-07-14 NOTE — Patient Instructions (Signed)
Medication Instructions:   Increase Cardizem to 300mg  daily.   Continue the short acting Cardizem 30mg  - twice a day as needed for breakthrough palpitations.   Continue all other medications.    Labwork: none  Testing/Procedures:  Your physician has requested that you have an echocardiogram. Echocardiography is a painless test that uses sound waves to create images of your heart. It provides your doctor with information about the size and shape of your heart and how well your heart's chambers and valves are working. This procedure takes approximately one hour. There are no restrictions for this procedure.  Office will contact with results via phone or letter.    Follow-Up: 4-6 weeks   Any Other Special Instructions Will Be Listed Below (If Applicable).  If you need a refill on your cardiac medications before your next appointment, please call your pharmacy.

## 2020-08-01 ENCOUNTER — Other Ambulatory Visit (HOSPITAL_COMMUNITY): Payer: Self-pay | Admitting: Family Medicine

## 2020-08-01 MED FILL — POLYETHYLENE GLYCOL 3350 PO: 17 | 28 days supply | Qty: 952 | Fill #0

## 2020-08-03 ENCOUNTER — Ambulatory Visit (INDEPENDENT_AMBULATORY_CARE_PROVIDER_SITE_OTHER): Payer: 59

## 2020-08-03 DIAGNOSIS — R0602 Shortness of breath: Secondary | ICD-10-CM

## 2020-08-03 LAB — ECHOCARDIOGRAM COMPLETE
AR max vel: 2.13 cm2
AV Area VTI: 2.3 cm2
AV Area mean vel: 2.14 cm2
AV Mean grad: 5.3 mmHg
AV Peak grad: 9.8 mmHg
Ao pk vel: 1.57 m/s
Area-P 1/2: 3.08 cm2
Calc EF: 73.5 %
MV M vel: 3.73 m/s
MV Peak grad: 55.8 mmHg
S' Lateral: 1.91 cm
Single Plane A2C EF: 73.8 %
Single Plane A4C EF: 74.3 %

## 2020-08-05 ENCOUNTER — Telehealth: Payer: Self-pay | Admitting: *Deleted

## 2020-08-05 NOTE — Telephone Encounter (Signed)
-----   Message from Merlene Laughter, RN sent at 08/03/2020  3:14 PM EST -----  ----- Message ----- From: Verta Ellen., NP Sent: 08/03/2020   1:44 PM EST To: Merlene Laughter, RN  Please call the patient and let her know that her echocardiogram showed she has good pumping function of her heart.  No leaky valves.  Everything looks good.

## 2020-08-05 NOTE — Telephone Encounter (Signed)
[name], LPN  0/08/2120 4:82 PM EST Back to Top     Notified, copy to pcp.

## 2020-08-08 NOTE — Progress Notes (Signed)
Cardiology Office Note  Date: 08/11/2020   ID: Shakedra, Igou 1960-10-19, MRN [id number]  PCP:  Caryl Bis, MD  Cardiologist:  Carlyle Dolly, MD Electrophysiologist:  None   Chief Complaint: Palpitations/ PVCs shortness of breath  History of Present Illness: Arnola YILIN WALLO is a 60 y.o. female with a history of palpitations /PVC's, SOB, back pain, s/p covid.   Last saw Bernerd Pho, [place] via telemedicine 02/10/2020. She was continuing Cardizem CD 240 mg daily and as needed Cardizem 30 mg for breakthrough palpitations. She had not used prn cardizem. Her LFT's were elevated and she was concerned due to sister with hx of biliary CA. CMET was ordered and if LFT's remained elevated would order a Abdominal US. BP was well controlled on Cartia XT 240 mg and Hydralazine 25 mg po bid. She was only taking Hydralazine once daily. She was encouraged to check BP in the evenings to make sure BP was not elevating.   She is here for follow-up today.  She denies any recent issues with palpitations.  Denies any shortness of breath/DOE.  No anginal symptoms, orthostatic symptoms, CVA or TIA-like symptoms, PND, orthopnea, bleeding, claudication, DVT or PE-like symptoms, or lower extremity edema.  She states she needs a refill on her diltiazem.  Blood pressures well controlled at 128/80.  Heart rate of 59.  States she believes some of her symptoms at last visit were related to her stress.  She is a Engineer, production.   Past Medical History:  Diagnosis Date   Allergic rhinitis    Angio-edema    Fibroids    Irregular heart beat    Migraines    Perimenopause 03/09/2014   Reflux    Urticaria     Past Surgical History:  Procedure Laterality Date   ADENOIDECTOMY     BREAST BIOPSY Left 2008   benign   COLONOSCOPY  06/2011   Dr. Britta Mccreedy: per patient it was normal, follow up in 10 years.    ESOPHAGOGASTRODUODENOSCOPY     Dr. Berneta Sages: late 1990s/early 2000    ESOPHAGOGASTRODUODENOSCOPY N/A 05/19/2014   SLF:NO OBVIOUS SOURCE FOR DYSGEUSIA IDENTIFED/MILD Non-erosive gastritis   INCISION AND DRAINAGE Right 07/31/2017   Procedure: INCISION AND DRAINAGE RIGHT THUMB NAIL BED;  Surgeon: Charlotte Crumb, MD;  Location: [facility];  Service: Orthopedics;  Laterality: Right;   INGUINAL HERNIA REPAIR     KNEE SURGERY Right    NAILBED REPAIR Right 07/31/2017   Procedure: NAILBED BIOPSY;  Surgeon: Charlotte Crumb, MD;  Location: [facility];  Service: Orthopedics;  Laterality: Right;   TONSILLECTOMY     TONSILLECTOMY AND ADENOIDECTOMY      Current Outpatient Medications  Medication Sig Dispense Refill   Biotin 5 MG CAPS Take 1 capsule by mouth daily.     CARDIZEM 30 MG tablet Take 1 tablet (30 mg total) by mouth 2 (two) times daily as needed (breakthrough palpitations). 30 tablet 6   cetirizine (ZYRTEC) 10 MG tablet Take 1 tablet (10 mg total) by mouth 2 (two) times daily. 180 tablet 1   cyclobenzaprine (FLEXERIL) 10 MG tablet Take 1 tablet by mouth every 8 (eight) hours as needed.  0   diltiazem (CARTIA XT) 300 MG 24 hr capsule Take 1 capsule (300 mg total) by mouth daily. 30 capsule 6   EPINEPHrine 0.3 mg/0.3 mL IJ SOAJ injection Use as directed for severe allergic reaction. 1 each 2   famotidine (PEPCID) 20 MG tablet Take  20 mg by mouth 2 (two) times daily.     fluticasone (FLONASE) 50 MCG/ACT nasal spray Place 2 sprays into both nostrils 2 (two) times daily. 48 g 1   hydrALAZINE (APRESOLINE) 25 MG tablet TAKE 1 TABLET BY MOUTH 2 TIMES DAILY. 180 tablet 3   Magnesium 250 MG TABS Take 1 tablet by mouth daily.     methocarbamol (ROBAXIN) 500 MG tablet Take 500 mg by mouth every 8 (eight) hours as needed.      montelukast (SINGULAIR) 10 MG tablet TAKE 1 TABLET BY MOUTH AT BEDTIME 90 tablet 1   Multiple Vitamins-Minerals (SM COMPLETE 50+) TABS Take 1 tablet by mouth daily.     NEEDLE, DISP, 27 G (BD ECLIPSE  NEEDLE) 27G X 1/2" MISC Use with allergy injection up to 2 times weekly as directed. 50 each 12   Needle, Disp, 27G X 1" MISC Inject 0.5 mLs into the skin 2 (two) times a week. As directed 50 each 12   ondansetron (ZOFRAN) 4 MG tablet Take 1 tablet (4 mg total) by mouth every 4 (four) hours as needed for nausea or vomiting. 30 tablet 2   pantoprazole (PROTONIX) 40 MG tablet Take 1 tablet by mouth 2 (two) times daily.      Polyethylene Glycol 3350 (MIRALAX PO) Take 17 g by mouth daily.     potassium chloride 20 MEQ TBCR Take 20 mEq by mouth daily. 90 tablet 3   Pyridoxine HCl (VITAMIN B6 PO) Take 1 tablet by mouth daily.     rizatriptan (MAXALT-MLT) 10 MG disintegrating tablet Take 10 mg by mouth as needed for migraine. May repeat in 2 hours if needed     SM CLEARLAX 17 GM/SCOOP powder Take 17 g by mouth 2 (two) times daily.     Tuberculin-Allergy Syringes 27G X 1/2" 1 ML MISC Use as directed for allergy injections. 100 each 5   Vitamin D, Ergocalciferol, (DRISDOL) 1.25 MG (50000 UT) CAPS capsule Take 1 capsule by mouth once a week.     vitamin E 100 UNIT capsule Take 100 Units by mouth daily.     No current facility-administered medications for this visit.   Allergies:  Metoprolol, Shellfish allergy, Flagyl [metronidazole], Lactose intolerance (gi), Losartan, Topamax [topiramate], Ciprofloxacin hcl, Diflucan [fluconazole], and Latex   Social History: The patient  reports that she has never smoked. She has never used smokeless tobacco. She reports that she does not drink alcohol and does not use drugs.   Family History: The patient's family history includes Cancer in her brother and sister; Hypertension in her brother, brother, brother, mother, sister, sister, sister, and sister; Other in her father and mother; Sleep apnea in her brother.   ROS:  Please see the history of present illness. Otherwise, complete review of systems is positive for none.  All other systems are reviewed and  negative.   Physical Exam: VS:  BP 128/80    Pulse (!) 59    Ht 5\' 3"  (1.6 m)    Wt 146 lb 3.2 oz (66.3 kg)    LMP 07/29/2017    SpO2 97%    BMI 25.90 kg/m , BMI Body mass index is 25.9 kg/m.  Wt Readings from Last 3 Encounters:  08/11/20 146 lb 3.2 oz (66.3 kg)  07/14/20 149 lb 9.6 oz (67.9 kg)  06/15/20 147 lb (66.7 kg)    General: Patient appears comfortable at rest. Neck: Supple, no elevated JVP or carotid bruits, no thyromegaly. Lungs: Clear to auscultation, nonlabored  breathing at rest. Cardiac: Regular rate and rhythm, no S3 or significant systolic murmur, no pericardial rub. Extremities: No pitting edema, distal pulses 2+. Skin: Warm and dry. Musculoskeletal: No kyphosis. Neuropsychiatric: Alert and oriented x3, affect grossly appropriate.  ECG:  An ECG dated 07/14/2020 was personally reviewed today and demonstrated:  Normal sinus rhythm rate of 63, minimal voltage criteria for LVH nonspecific ST and T wave abnormality.  Recent Labwork: 03/16/2020: ALT 56; AST 32; BUN 15; Creatinine, Ser 0.77; Potassium 3.9; Sodium 139     Component Value Date/Time   CHOL 239 (H) 02/04/2020 1518   TRIG 372 (H) 02/04/2020 1518   HDL 44 02/04/2020 1518   CHOLHDL 5.4 02/04/2020 1518   VLDL 74 (H) 02/04/2020 1518   LDLCALC 121 (H) 02/04/2020 1518    Other Studies Reviewed Today:  Echocardiogram 08/03/2020 1. Left ventricular ejection fraction, by estimation, is >75%. The left ventricle has normal function. The left ventricle has no regional wall motion abnormalities. Left ventricular diastolic parameters are indeterminate. The average left ventricular global longitudinal strain is -19.0 %. The global longitudinal strain is normal. 2. Right ventricular systolic function is normal. The right ventricular size is normal. 3. The mitral valve is normal in structure. No evidence of mitral valve regurgitation. No evidence of mitral stenosis. 4. The aortic valve has an indeterminant number of  cusps. Aortic valve regurgitation is not visualized. No aortic stenosis is present. 5. The inferior vena cava is normal in size with greater than 50% respiratory variability, suggesting right atrial pressure of 3 mmHg. Comparison(s): Echocardiogram done 09/12/16 showed an EF of 65-70%.   Cardiac monitor 10/25/2017 Summary: The patient's monitoring period was 10/24/2017 - 11/13/2017. Baseline sample showed Sinus Rhythm with a heart rate of 91.1 bpm. There were 0 critical, 0 serious, and 2 stable events that occurred. The report analysis of the critical, serious, stable and manually triggered events are listed below. Automatically Detected Events: Manually Detected Events: 2 Stable: Sinus Rhythm    Echocardiogram 09/12/2016 Study Conclusions  - Left ventricle: The cavity size was normal. Wall thickness was  normal. Systolic function was vigorous. The estimated ejection  fraction was in the range of 65% to 70%. Wall motion was normal;  there were no regional wall motion abnormalities. Left  ventricular diastolic function parameters were normal.    Assessment and Plan:   1. Shortness of breath Describes having shortness of breath since she had COVID pneumonia back in 2020. States she becomes winded just going up stairs and more than usual activities. Recent echocardiogram showed vigorous EF greater than 75%.  No WMA's, no evidence of diastolic dysfunction, no valvular abnormalities.   2. Palpitations Currently no complaints of palpitations.  Continue diltiazem 300 mg daily and Cardizem 30 mg as needed for palpitations.   3. Back pain, unspecified back location, unspecified back pain laterality, unspecified chronicity She denies any issues with back pain today.  She was taking Flexeril and Robaxin for relief.  She was concerned it may be stress related.  Stated it seemed to hurt more when she knew she had to go back to work. 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  4. Essential hypertension Blood pressure  well controlled today at 128/80.  Continue hydralazine 25 mg p.o. twice daily.  Medication Adjustments/Labs and Tests Ordered: Current medicines are reviewed at length with the patient today.  Concerns regarding medicines are outlined above.   Disposition: Follow-up with [name]  or APP 6 months   signed, Levell July, NP 08/11/2020 8:56 AM    [facility] [place], [place], [place] [address] Phone: [phone]; Fax: [phone]

## 2020-08-11 ENCOUNTER — Ambulatory Visit: Payer: 59 | Admitting: Family Medicine

## 2020-08-11 ENCOUNTER — Other Ambulatory Visit: Payer: Self-pay | Admitting: Family Medicine

## 2020-08-11 ENCOUNTER — Encounter: Payer: Self-pay | Admitting: Family Medicine

## 2020-08-11 VITALS — BP 128/80 | HR 59 | Ht 63.0 in | Wt 146.2 lb

## 2020-08-11 DIAGNOSIS — M549 Dorsalgia, unspecified: Secondary | ICD-10-CM

## 2020-08-11 DIAGNOSIS — I10 Essential (primary) hypertension: Secondary | ICD-10-CM | POA: Diagnosis not present

## 2020-08-11 DIAGNOSIS — R0602 Shortness of breath: Secondary | ICD-10-CM

## 2020-08-11 DIAGNOSIS — R002 Palpitations: Secondary | ICD-10-CM

## 2020-08-11 MED ORDER — DILTIAZEM HCL ER COATED BEADS 300 MG PO CP24: 300.0000 mg | ORAL_CAPSULE | Freq: Every day | ORAL | 3 refills | Status: DC

## 2020-08-11 MED FILL — DILTIAZEM 24HR ER 300 MG CA: 300 | 90 days supply | Qty: 90 | Fill #0

## 2020-08-11 MED FILL — POLYETHYLENE GLYCOL 3350 PO: 17 | 28 days supply | Qty: 952 | Fill #0

## 2020-08-11 MED FILL — LATANOPROST 0.005% EYE DRP: 0.005 | 25 days supply | Qty: 3 | Fill #1

## 2020-08-11 MED FILL — HYDRALAZINE HCL 25 MG TABS: 25 | 90 days supply | Qty: 180 | Fill #2

## 2020-08-11 NOTE — Patient Instructions (Signed)
Medication Instructions:  Continue all current medications.   Labwork: none  Testing/Procedures: none  Follow-Up: 6 months   Any Other Special Instructions Will Be Listed Below (If Applicable).   If you need a refill on your cardiac medications before your next appointment, please call your pharmacy.  

## 2020-08-22 DIAGNOSIS — M25562 Pain in left knee: Secondary | ICD-10-CM | POA: Diagnosis not present

## 2020-08-22 DIAGNOSIS — Z6825 Body mass index (BMI) 25.0-25.9, adult: Secondary | ICD-10-CM | POA: Diagnosis not present

## 2020-08-29 ENCOUNTER — Other Ambulatory Visit (HOSPITAL_COMMUNITY): Payer: Self-pay | Admitting: Family Medicine

## 2020-08-29 MED FILL — CETIRIZINE HCL 10 MG TABS: 10 | 90 days supply | Qty: 90 | Fill #0

## 2020-09-06 ENCOUNTER — Other Ambulatory Visit (HOSPITAL_COMMUNITY): Payer: Self-pay | Admitting: Allergy & Immunology

## 2020-09-06 ENCOUNTER — Other Ambulatory Visit: Payer: Self-pay | Admitting: Allergy & Immunology

## 2020-09-06 MED FILL — FLUTICASONE PROP 50 MCG SPR: 50 | 90 days supply | Qty: 48 | Fill #0

## 2020-09-06 MED FILL — EPINEPHRINE 0.3 MG AUTO-INJ: 0.3 | 30 days supply | Qty: 2 | Fill #1

## 2020-09-06 MED FILL — AZELASTINE HCL 137 MCG/SPRA: 137 | 30 days supply | Qty: 30 | Fill #0

## 2020-09-19 DIAGNOSIS — Z9889 Other specified postprocedural states: Secondary | ICD-10-CM | POA: Diagnosis not present

## 2020-09-19 DIAGNOSIS — H2000 Unspecified acute and subacute iridocyclitis: Secondary | ICD-10-CM | POA: Diagnosis not present

## 2020-09-19 DIAGNOSIS — H40023 Open angle with borderline findings, high risk, bilateral: Secondary | ICD-10-CM | POA: Diagnosis not present

## 2020-09-23 ENCOUNTER — Other Ambulatory Visit (HOSPITAL_BASED_OUTPATIENT_CLINIC_OR_DEPARTMENT_OTHER): Payer: Self-pay

## 2020-10-06 ENCOUNTER — Other Ambulatory Visit (HOSPITAL_COMMUNITY): Payer: Self-pay

## 2020-10-12 ENCOUNTER — Other Ambulatory Visit: Payer: Self-pay | Admitting: Family Medicine

## 2020-10-15 ENCOUNTER — Other Ambulatory Visit (HOSPITAL_COMMUNITY): Payer: Self-pay

## 2020-10-21 ENCOUNTER — Other Ambulatory Visit (HOSPITAL_COMMUNITY): Payer: Self-pay

## 2020-10-22 ENCOUNTER — Other Ambulatory Visit (HOSPITAL_COMMUNITY): Payer: Self-pay

## 2020-10-22 MED ORDER — PANTOPRAZOLE SODIUM 40 MG PO TBEC
DELAYED_RELEASE_TABLET | ORAL | 1 refills | Status: DC
  Filled 2020-10-22: qty 90, 90d supply, fill #0
  Filled 2021-01-05: qty 90, 90d supply, fill #1

## 2020-10-24 ENCOUNTER — Other Ambulatory Visit (HOSPITAL_COMMUNITY): Payer: Self-pay

## 2020-10-25 ENCOUNTER — Other Ambulatory Visit (HOSPITAL_COMMUNITY): Payer: Self-pay

## 2020-11-03 DIAGNOSIS — H2513 Age-related nuclear cataract, bilateral: Secondary | ICD-10-CM | POA: Diagnosis not present

## 2020-11-03 DIAGNOSIS — Z9889 Other specified postprocedural states: Secondary | ICD-10-CM | POA: Diagnosis not present

## 2020-11-03 DIAGNOSIS — H40023 Open angle with borderline findings, high risk, bilateral: Secondary | ICD-10-CM | POA: Diagnosis not present

## 2020-11-03 DIAGNOSIS — H2000 Unspecified acute and subacute iridocyclitis: Secondary | ICD-10-CM | POA: Diagnosis not present

## 2020-11-06 MED FILL — Diltiazem HCl Coated Beads Cap ER 24HR 300 MG: ORAL | 90 days supply | Qty: 90 | Fill #0 | Status: AC

## 2020-11-07 ENCOUNTER — Other Ambulatory Visit (HOSPITAL_COMMUNITY): Payer: Self-pay

## 2020-11-08 ENCOUNTER — Other Ambulatory Visit (HOSPITAL_COMMUNITY): Payer: Self-pay

## 2020-11-09 ENCOUNTER — Encounter: Payer: Self-pay | Admitting: Orthopaedic Surgery

## 2020-11-09 ENCOUNTER — Ambulatory Visit (INDEPENDENT_AMBULATORY_CARE_PROVIDER_SITE_OTHER): Payer: 59 | Admitting: Orthopaedic Surgery

## 2020-11-09 ENCOUNTER — Other Ambulatory Visit: Payer: Self-pay

## 2020-11-09 VITALS — Ht 63.0 in | Wt 146.0 lb

## 2020-11-09 DIAGNOSIS — M7712 Lateral epicondylitis, left elbow: Secondary | ICD-10-CM | POA: Diagnosis not present

## 2020-11-09 DIAGNOSIS — M7711 Lateral epicondylitis, right elbow: Secondary | ICD-10-CM

## 2020-11-09 MED ORDER — LIDOCAINE HCL 1 % IJ SOLN
0.5000 mL | INTRAMUSCULAR | Status: AC | PRN
  Administered 2020-11-09: .5 mL

## 2020-11-09 NOTE — Progress Notes (Signed)
Office Visit Note   Patient: Rose Delgado           Date of Birth: April 11, 1961           MRN: [id number] Visit Date: 11/09/2020              Requested by: Caryl Bis, MD [place],  [place] [address] PCP: Caryl Bis, MD   Assessment & Plan: Visit Diagnoses:  1. Bilateral tennis elbow     Plan: Recurrent symptoms of left tennis elbow.  Last seen in December with cortisone injection and application of tennis elbow splint.  Rose Delgado would like to have another injection.  This was performed without difficulty.  Have discussed exercises and wearing the splint particularly at work where she has to do some lifting and pulling.  Also have discussed future treatments including MRI and surgery if she continues to have recurrent discomfort  Follow-Up Instructions: Return if symptoms worsen or fail to improve.   Orders:  Orders Placed This Encounter  Procedures  . Hand/UE Inj   No orders of the defined types were placed in this encounter.     Procedures: Hand/UE Inj for lateral epicondylitis on 11/09/2020 1:30 PM Details: 27 G needle, lateral approach Medications: 0.5 mL lidocaine 1 %  4 mg betamethasone injected with Xylocaine into the lateral epicondyles left elbow      Clinical Data: No additional findings.   Subjective: Chief Complaint  Patient presents with  . Left Elbow - Pain  Patient presents today for follow up on her recurrent left tennis elbow. She received a cortisone injection in December of 2021 and it was great. She states that her elbow started to hurt again two weeks ago. She would like to get another injection today.   HPI  Review of Systems   Objective: Vital Signs: Ht 5\' 3"  (1.6 m)   Wt 146 lb (66.2 kg)   LMP 07/29/2017   BMI 25.86 kg/m   Physical Exam Constitutional:      Appearance: She is well-developed.  Eyes:     Pupils: Pupils are equal, round, and reactive to light.  Pulmonary:     Effort: Pulmonary effort is  normal.  Skin:    General: Skin is warm and dry.  Neurological:     Mental Status: She is alert and oriented to person, place, and time.  Psychiatric:        Behavior: Behavior normal.     Ortho Exam awake alert and oriented x3.  Comfortable sitting.  Left elbow with full range of motion but pain with grip and extension over the lateral epicondyle.  There is discomfort over the lateral epicondyles.  No crepitation.  Skin intact.  Neurologically intact no pain over the medial epicondyles or the ulnar nerve. Specialty Comments:  No specialty comments available.  Imaging: No results found.   PMFS History: Patient Active Problem List   Diagnosis Date Noted  . Bilateral tennis elbow 06/01/2020  . PMB (postmenopausal bleeding) 01/07/2019  . Encounter for gynecological examination with Papanicolaou smear of cervix 01/07/2019  . Screening for colorectal cancer 01/07/2019  . Seasonal and perennial allergic rhinitis 08/27/2018  . 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  . Pain in finger of right hand 06/27/2017  . Menorrhagia 05/28/2016  . Left hand pain 02/16/2016  . 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 Nausea with vomiting 07/14/2014  . Dysgeusia 03/11/2014  . GERD (gastroesophageal reflux disease)  03/11/2014  . Hot flashes 03/09/2014  . Perimenopause 03/09/2014  . Fibroids 03/09/2014   Past Medical History:  Diagnosis Date  . Allergic rhinitis   . Angio-edema   . Fibroids   . Irregular heart beat   . Migraines   . Perimenopause 03/09/2014  . Reflux   . Urticaria     Family History  Problem Relation Age of Onset  . Other Mother        glaucoma; pacemaker  . Hypertension Mother   . Other Father        aneursym  . Cancer Sister        biliary, deceased age 89  . Hypertension Sister   . Cancer Brother        lung  . Hypertension Brother   . Sleep apnea Brother   . Hypertension Brother   . Hypertension Brother   .  Hypertension Sister   . Hypertension Sister   . Hypertension Sister   . Colon cancer Neg Hx   . Allergic rhinitis Neg Hx   . Asthma Neg Hx     Past Surgical History:  Procedure Laterality Date  . ADENOIDECTOMY    . BREAST BIOPSY Left 2008   benign  . COLONOSCOPY  06/2011   Dr. Britta Mccreedy: per patient it was normal, follow up in 10 years.   . ESOPHAGOGASTRODUODENOSCOPY     Dr. Berneta Sages: late 1990s/early 2000  . ESOPHAGOGASTRODUODENOSCOPY N/A 05/19/2014   SLF:NO OBVIOUS SOURCE FOR DYSGEUSIA IDENTIFED/MILD Non-erosive gastritis  . INCISION AND DRAINAGE Right 07/31/2017   Procedure: INCISION AND DRAINAGE RIGHT THUMB NAIL BED;  Surgeon: Charlotte Crumb, MD;  Location: [facility];  Service: Orthopedics;  Laterality: Right;  . INGUINAL HERNIA REPAIR    . KNEE SURGERY Right   . NAILBED REPAIR Right 07/31/2017   Procedure: NAILBED BIOPSY;  Surgeon: Charlotte Crumb, MD;  Location: [facility];  Service: Orthopedics;  Laterality: Right;  . TONSILLECTOMY    . TONSILLECTOMY AND ADENOIDECTOMY     Social History   Occupational History  . Occupation: Therapist, sports at [employer]: [place]  Tobacco Use  . Smoking status: Never Smoker  . Smokeless tobacco: Never Used  Vaping Use  . Vaping Use: Never used  Substance and Sexual Activity  . Alcohol use: No  . Drug use: No  . Sexual activity: Yes    Birth control/protection: None, Post-menopausal

## 2020-11-11 ENCOUNTER — Other Ambulatory Visit: Payer: Self-pay

## 2020-11-11 ENCOUNTER — Ambulatory Visit (INDEPENDENT_AMBULATORY_CARE_PROVIDER_SITE_OTHER): Payer: 59 | Admitting: Allergy & Immunology

## 2020-11-11 ENCOUNTER — Other Ambulatory Visit (HOSPITAL_COMMUNITY): Payer: Self-pay

## 2020-11-11 ENCOUNTER — Encounter: Payer: Self-pay | Admitting: Allergy & Immunology

## 2020-11-11 VITALS — BP 138/82 | HR 76 | Temp 97.4°F | Resp 18 | Ht 63.0 in | Wt 150.8 lb

## 2020-11-11 DIAGNOSIS — J3089 Other allergic rhinitis: Secondary | ICD-10-CM | POA: Diagnosis not present

## 2020-11-11 DIAGNOSIS — J302 Other seasonal allergic rhinitis: Secondary | ICD-10-CM | POA: Diagnosis not present

## 2020-11-11 DIAGNOSIS — T7800XD Anaphylactic reaction due to unspecified food, subsequent encounter: Secondary | ICD-10-CM

## 2020-11-11 MED ORDER — AZELASTINE HCL 137 MCG/SPRAY NA SOLN
1.0000 | Freq: Every day | NASAL | 3 refills | Status: DC
  Filled 2020-11-11: qty 90, 90d supply, fill #0

## 2020-11-11 MED ORDER — FAMOTIDINE 20 MG PO TABS
20.0000 mg | ORAL_TABLET | Freq: Two times a day (BID) | ORAL | 3 refills | Status: DC
  Filled 2020-11-11: qty 90, 45d supply, fill #0
  Filled 2021-04-10: qty 90, 45d supply, fill #1
  Filled 2021-07-04: qty 90, 45d supply, fill #2
  Filled 2021-10-14: qty 90, 45d supply, fill #3

## 2020-11-11 MED ORDER — CETIRIZINE HCL 10 MG PO TABS
ORAL_TABLET | Freq: Every day | ORAL | 3 refills | Status: DC
  Filled 2020-11-11: qty 90, fill #0
  Filled 2020-11-23: qty 90, 90d supply, fill #0
  Filled 2021-02-05: qty 90, 90d supply, fill #1
  Filled 2021-05-01: qty 90, 90d supply, fill #2
  Filled 2021-09-02: qty 90, 90d supply, fill #3

## 2020-11-11 MED ORDER — EPINEPHRINE 0.3 MG/0.3ML IJ SOAJ
INTRAMUSCULAR | 2 refills | Status: DC
  Filled 2020-11-11: qty 2, 30d supply, fill #0

## 2020-11-11 MED ORDER — FLUTICASONE PROPIONATE 50 MCG/ACT NA SUSP
NASAL | 3 refills | Status: DC
  Filled 2020-11-11: qty 48, 90d supply, fill #0

## 2020-11-11 MED ORDER — MONTELUKAST SODIUM 10 MG PO TABS
ORAL_TABLET | Freq: Every day | ORAL | 3 refills | Status: DC
  Filled 2020-11-11: qty 90, 90d supply, fill #0
  Filled 2021-02-19: qty 90, 90d supply, fill #1
  Filled 2021-06-27: qty 90, 90d supply, fill #2

## 2020-11-11 NOTE — Patient Instructions (Addendum)
1. Allergic rhinitis - Continue cetirizine 10 mg once daily. - Continue fluticasone 1 spray in each nostril twice daily as needed.  - Continue azelastine 2 sprays in each nostril twice daily as needed.  - We will send in Singulair in case you need to restart it.   2. Food allergy - Continue to avoid shellfish with the exception of shrimp.  - EpiPen refilled today.   3. Return in about 1 year (around 11/11/2021).    Please inform us of any Emergency Department visits, hospitalizations, or changes in symptoms. Call us before going to the ED for breathing or allergy symptoms since we might be able to fit you in for a sick visit. Feel free to contact us anytime with any questions, problems, or concerns.  It was a pleasure to see you again today!  Websites that have reliable patient information: 1. American Academy of Asthma, Allergy, and Immunology: www.aaaai.org 2. Food Allergy Research and Education (FARE): foodallergy.org 3. Mothers of Asthmatics: http://www.asthmacommunitynetwork.org 4. American College of Allergy, Asthma, and Immunology: www.acaai.org   COVID-19 Vaccine Information can be found at: ShippingScam.co.uk For questions related to vaccine distribution or appointments, please email vaccine@[email or web address] [email or web address] or call 9207213727.   We realize that you might be concerned about having an allergic reaction to the COVID19 vaccines. To help with that concern, WE ARE OFFERING THE COVID19 VACCINES IN OUR OFFICE! Ask the front desk for dates!     "Like" [place] on Facebook and Instagram for our latest updates!      A healthy democracy works best when [employer] participate! Make sure you are registered to vote! If you have moved or changed any of your contact information, you will need to get this updated before voting!  In some cases, you MAY be able to register to vote online:  [email or web address]

## 2020-11-11 NOTE — Progress Notes (Signed)
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 Allergic rhinitis - Continue cetirizine 10 mg once daily. - Continue fluticasone 1 spray in each nostril twice daily as needed.  - Continue azelastine 2 sprays in each nostril twice daily as needed.  - We will send in Singulair in case you need to restart it.   2. Food allergy - Continue to avoid shellfish with the exception of shrimp.  - EpiPen refilled today.   3. Return in about 1 year (around 11/11/2021).    Subjective:   Rose Delgado is a 60 y.o. female presenting today for follow up of  Chief Complaint  Patient presents with  . Seasonal and perennial allergic rhinitis    Rose Delgado has a history of the following: Patient Active Problem List   Diagnosis Date Noted  . Bilateral tennis elbow 06/01/2020  . PMB (postmenopausal bleeding) 01/07/2019  . Encounter for gynecological examination with Papanicolaou smear of cervix 01/07/2019  . Screening for colorectal cancer 01/07/2019  . Seasonal and perennial allergic rhinitis 08/27/2018  . 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  . Pain in finger of right hand 06/27/2017  . Menorrhagia 05/28/2016  . Left hand pain 02/16/2016  . 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 Nausea with vomiting 07/14/2014  . Dysgeusia 03/11/2014  . GERD (gastroesophageal reflux disease) 03/11/2014  . Hot flashes 03/09/2014  . Perimenopause 03/09/2014  . Fibroids 03/09/2014    History obtained from: chart review and patient.  Rose Delgado is a 60 y.o. female presenting for a follow up visit.  She was last seen in June 2021.  At that time, we continued her on Zyrtec, Flonase, and  Astelin.  We recommended freezing her mold vial 0.05 mL.  For her food allergies, she continue to avoid shellfish.  We did make sure her EpiPen was up-to-date.   Since last visit, she has done fine.  Unfortunately, it looks like she has stopped her allergy shots. She was having too many large local reactions.   Allergic Rhinitis Symptom History: She is doing well despite not being on her shots. She is off of the Singulair ans has been fine without it. She is using both of the nose sprays daily throughout the year. She has been off of the Singulair but she has been fine with this. She has not had antibiotics. She is using the cetirizine and Pepcid every day. She was placed on the Pepcid because of a post-vaccine reaction.   Food Allergy Symptom History: She continues to avoid all shellfish, except for shrimp. She tolerates shrimp without a problem. She does need a new EpiPen. She is going to the beach and wants to make sure that she has an up to date EpiPen in case there is a cross contamination issue.   She has continued with work with [employer]. 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   Otherwise, there have been no changes to her past medical history, surgical history, family history, or social history.    Review of Systems  Constitutional: Negative.  Negative for chills, fever, malaise/fatigue and weight loss.  HENT: Positive for congestion. Negative for ear discharge, ear  pain and sinus pain.   Eyes: Negative for pain, discharge and redness.  Respiratory: Negative for cough, sputum production, shortness of breath and wheezing.   Cardiovascular: Negative.  Negative for chest pain and palpitations.  Gastrointestinal: Negative for abdominal pain, constipation, diarrhea, heartburn, nausea and vomiting.  Skin: Negative.  Negative for itching and rash.  Neurological: Negative for dizziness and headaches.  Endo/Heme/Allergies: Positive for  environmental allergies. Does not bruise/bleed easily.       Positive for food allergies.       Objective:   Blood pressure 138/82, pulse 76, temperature (!) 97.4 F (36.3 C), temperature source Temporal, resp. rate 18, height 5\' 3"  (1.6 m), weight 150 lb 12.8 oz (68.4 kg), last menstrual period 07/29/2017, SpO2 99 %. Body mass index is 26.71 kg/m.   Physical Exam:  Physical Exam Constitutional:      Appearance: She is well-developed.  HENT:     Head: Normocephalic and atraumatic.     Right Ear: Tympanic membrane, ear canal and external ear normal.     Left Ear: Tympanic membrane, ear canal and external ear normal.     Nose: No nasal deformity, septal deviation, mucosal edema or rhinorrhea.     Right Turbinates: Enlarged and swollen.     Left Turbinates: Enlarged and swollen.     Right Sinus: No maxillary sinus tenderness or frontal sinus tenderness.     Left Sinus: No maxillary sinus tenderness or frontal sinus tenderness.     Mouth/Throat:     Mouth: Mucous membranes are not pale and not dry.     Pharynx: Uvula midline.  Eyes:     General:        Right eye: No discharge.        Left eye: No discharge.     Conjunctiva/sclera: Conjunctivae normal.     Right eye: Right conjunctiva is not injected. No chemosis.    Left eye: Left conjunctiva is not injected. No chemosis.    Pupils: Pupils are equal, round, and reactive to light.  Cardiovascular:     Rate and Rhythm: Normal rate and regular rhythm.     Heart sounds: Normal heart sounds.  Pulmonary:     Effort: Pulmonary effort is normal. No tachypnea, accessory muscle usage or respiratory distress.     Breath sounds: Normal breath sounds. No wheezing, rhonchi or rales.     Comments: Moving air well in all lung fields. No increased work of breathing.  Chest:     Chest wall: No tenderness.  Lymphadenopathy:     Cervical: No cervical adenopathy.  Skin:    Coloration: Skin is not pale.     Findings: No abrasion, erythema,  petechiae or rash. Rash is not papular, urticarial or vesicular.  Neurological:     Mental Status: She is alert.  Psychiatric:        Behavior: Behavior is cooperative.      Diagnostic studies: none     Salvatore Marvel, MD  [facility]

## 2020-11-19 ENCOUNTER — Other Ambulatory Visit (HOSPITAL_COMMUNITY): Payer: Self-pay

## 2020-11-21 ENCOUNTER — Other Ambulatory Visit (HOSPITAL_COMMUNITY): Payer: Self-pay

## 2020-11-24 ENCOUNTER — Other Ambulatory Visit (HOSPITAL_COMMUNITY): Payer: Self-pay

## 2020-11-24 ENCOUNTER — Other Ambulatory Visit (HOSPITAL_COMMUNITY)
Admission: RE | Admit: 2020-11-24 | Discharge: 2020-11-24 | Disposition: A | Discharge status: Home / Self Care | Payer: 59 | Source: Other Acute Inpatient Hospital | Attending: Family Medicine | Admitting: Family Medicine

## 2020-11-24 DIAGNOSIS — Z0000 Encounter for general adult medical examination without abnormal findings: Secondary | ICD-10-CM | POA: Diagnosis not present

## 2020-11-25 LAB — TSH: TSH: 0.889 u[IU]/mL (ref 0.350–4.500)

## 2020-11-25 LAB — COMPREHENSIVE METABOLIC PANEL
ALT: 35 U/L (ref 0–44)
AST: 24 U/L (ref 15–41)
Albumin: 4.3 g/dL (ref 3.5–5.0)
Alkaline Phosphatase: 106 U/L (ref 38–126)
Anion gap: 7 (ref 5–15)
BUN: 13 mg/dL (ref 6–20)
CO2: 22 mmol/L (ref 22–32)
Calcium: 10 mg/dL (ref 8.9–10.3)
Chloride: 108 mmol/L (ref 98–111)
Creatinine, Ser: 0.84 mg/dL (ref 0.44–1.00)
GFR, Estimated: >60 mL/min (ref >60)
Glucose, Bld: 109 mg/dL — ABNORMAL HIGH (ref 70–99)
Potassium: 4.1 mmol/L (ref 3.5–5.1)
Sodium: 137 mmol/L (ref 135–145)
Total Bilirubin: 0.5 mg/dL (ref 0.3–1.2)
Total Protein: 7.4 g/dL (ref 6.5–8.1)

## 2020-11-25 LAB — LIPID PANEL
Cholesterol: 235 mg/dL — ABNORMAL HIGH (ref 0–200)
HDL: 50 mg/dL (ref >40)
LDL Cholesterol: 146 mg/dL — ABNORMAL HIGH (ref 0–99)
Total CHOL/HDL Ratio: 4.7 RATIO
Triglycerides: 196 mg/dL — ABNORMAL HIGH (ref <150)
VLDL: 39 mg/dL (ref 0–40)

## 2020-11-25 LAB — CBC
HCT: 43.2 % (ref 36.0–46.0)
Hemoglobin: 14.2 g/dL (ref 12.0–15.0)
MCH: 31.6 pg (ref 26.0–34.0)
MCHC: 32.9 g/dL (ref 30.0–36.0)
MCV: 96 fL (ref 80.0–100.0)
Platelets: 257 10*3/uL (ref 150–400)
RBC: 4.5 MIL/uL (ref 3.87–5.11)
RDW: 12.5 % (ref 11.5–15.5)
WBC: 7.6 10*3/uL (ref 4.0–10.5)
nRBC: 0 % (ref 0.0–0.2)

## 2020-11-25 LAB — TROPONIN I (HIGH SENSITIVITY): Troponin I (High Sensitivity): 2 ng/L (ref <18)

## 2020-11-29 DIAGNOSIS — I10 Essential (primary) hypertension: Secondary | ICD-10-CM | POA: Diagnosis not present

## 2020-11-29 DIAGNOSIS — E559 Vitamin D deficiency, unspecified: Secondary | ICD-10-CM | POA: Diagnosis not present

## 2020-11-29 DIAGNOSIS — G43909 Migraine, unspecified, not intractable, without status migrainosus: Secondary | ICD-10-CM | POA: Diagnosis not present

## 2020-11-29 DIAGNOSIS — E7849 Other hyperlipidemia: Secondary | ICD-10-CM | POA: Diagnosis not present

## 2020-11-29 DIAGNOSIS — Z6825 Body mass index (BMI) 25.0-25.9, adult: Secondary | ICD-10-CM | POA: Diagnosis not present

## 2020-11-29 DIAGNOSIS — J301 Allergic rhinitis due to pollen: Secondary | ICD-10-CM | POA: Diagnosis not present

## 2020-12-05 ENCOUNTER — Other Ambulatory Visit: Payer: Self-pay

## 2020-12-05 ENCOUNTER — Encounter: Payer: Self-pay | Admitting: Obstetrics & Gynecology

## 2020-12-05 ENCOUNTER — Ambulatory Visit (INDEPENDENT_AMBULATORY_CARE_PROVIDER_SITE_OTHER): Payer: 59 | Admitting: Obstetrics & Gynecology

## 2020-12-05 VITALS — BP 117/67 | HR 65 | Ht 63.0 in | Wt 151.8 lb

## 2020-12-05 DIAGNOSIS — Z1231 Encounter for screening mammogram for malignant neoplasm of breast: Secondary | ICD-10-CM | POA: Diagnosis not present

## 2020-12-05 DIAGNOSIS — Z01419 Encounter for gynecological examination (general) (routine) without abnormal findings: Secondary | ICD-10-CM | POA: Diagnosis not present

## 2020-12-05 NOTE — Progress Notes (Signed)
WELL-WOMAN EXAMINATION Patient name: Rose Delgado MRN [id number]  Date of birth: 1961-05-22 Chief Complaint:   Gynecologic Exam  History of Present Illness:   Rose Delgado is a 60 y.o. postmenopausal female being seen today for a routine well-woman exam.  Today she notes  -Pelvic pain: About 3 weeks ago she noted some sharp pain on her left side- felt like a contractions.  Tried some OTC with relief of her symptoms.  Denies pelvic pain currently.  Denies vaginal bleeding, discharge, itching or irritation.  No other acute complaints.   Works in [employer]- they have been very busy.  Taking a vacation next week with a girlfriend to [place].  Last pap 12/2018.  Last mammogram: 06/2020. Last colonoscopy: 06/2011  Depression screen [facility] 2/9 12/05/2020 01/07/2019 04/19/2016 10/20/2015 09/17/2014  Decreased Interest 0 0 0 0 0  Down, Depressed, Hopeless 0 0 0 0 0  PHQ - 2 Score 0 0 0 0 0  Altered sleeping 0 - - - -  Tired, decreased energy 0 - - - -  Change in appetite 0 - - - -  Feeling bad or failure about yourself  0 - - - -  Trouble concentrating 0 - - - -  Moving slowly or fidgety/restless 0 - - - -  Suicidal thoughts 0 - - - -  PHQ-9 Score 0 - - - -      Review of Systems:   Pertinent items are noted in HPI Denies any headaches, blurred vision, fatigue, shortness of breath, chest pain, abdominal pain, bowel movements, urination, or intercourse unless otherwise stated above.  Pertinent History Reviewed:  Reviewed past medical,surgical, social and family history.  Reviewed problem list, medications and allergies. Physical Assessment:   Vitals:   12/05/20 1119  BP: 117/67  Pulse: 65  Weight: 151 lb 12.8 oz (68.9 kg)  Height: 5\' 3"  (1.6 m)  Body mass index is 26.89 kg/m.        Physical Examination:   General appearance - well appearing, and in no distress  Mental status - alert, oriented to person, place, and time  Psych:  She has a normal mood and affect  Skin -  warm and dry, normal color, no suspicious lesions noted  Chest - effort normal, all lung fields clear to auscultation bilaterally  Heart - normal rate and regular rhythm  Neck:  midline trachea, no thyromegaly or nodules  Breasts - breasts appear normal, no suspicious masses, no skin or nipple changes or  axillary nodes  Abdomen - soft, nontender, nondistended, no masses or organomegaly  Pelvic - VULVA: normal appearing vulva with no masses, tenderness or lesions  VAGINA: normal appearing vagina with normal color and discharge, no lesions  CERVIX: normal appearing cervix without discharge or lesions, no CMT  UTERUS: uterus is felt to be normal size, shape, consistency and nontender   ADNEXA: No adnexal masses or tenderness noted.  Extremities:  No swelling or varicosities noted  Chaperone: Journalist, newspaper & Plan:  1) Well-Woman Exam Pap up to date Mammogram ordered Colonoscopy up to date  2) Pelvic pain -no abnormalities noted on exam -should pain recur or become more frequent- let us know will plan to schedule [place] if needed  Orders Placed This Encounter  Procedures  . MM 3D SCREEN BREAST BILATERAL    Meds: No orders of the defined types were placed in this encounter.   Follow-up: No follow-ups on file.   Anderson Malta  Nelda Marseille, DO Attending [place], [facility] for [employer], [place]

## 2020-12-28 ENCOUNTER — Telehealth: Payer: Self-pay | Admitting: Adult Health

## 2020-12-28 MED ORDER — NITROFURANTOIN MONOHYD MACRO 100 MG PO CAPS: 100.0000 mg | ORAL_CAPSULE | Freq: Two times a day (BID) | ORAL | 0 refills | Status: DC

## 2020-12-28 NOTE — Telephone Encounter (Signed)
Will rx macrobid

## 2020-12-28 NOTE — Telephone Encounter (Signed)
Called patient. She reports that she has cloudy urine with foul odor, also notes frequency. This has been going on for 2 days. She has had this before and it is similar. 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 Wanted to see if Anderson Malta would send in abx since Dr. Nelda Marseille isn't here. Advised I would send a message to [place] and we would let her know.

## 2020-12-28 NOTE — Telephone Encounter (Signed)
Patient called sating that she was told by Dr. Nelda Marseille that id she needed something for her UT just call and she would prescribe something. I informed Pt that Dr. Nelda Marseille is not in the office and she would like to know if Anderson Malta could call her in something to the [facility]. Please contact pt

## 2020-12-28 NOTE — Addendum Note (Signed)
Addended by: Derrek Monaco A on: 12/28/2020 05:09 PM   Modules accepted: Orders

## 2021-01-05 ENCOUNTER — Other Ambulatory Visit: Payer: Self-pay

## 2021-01-06 ENCOUNTER — Other Ambulatory Visit: Payer: Self-pay

## 2021-01-06 ENCOUNTER — Other Ambulatory Visit (HOSPITAL_COMMUNITY): Payer: Self-pay

## 2021-01-09 ENCOUNTER — Other Ambulatory Visit: Payer: Self-pay | Admitting: Family Medicine

## 2021-01-09 ENCOUNTER — Other Ambulatory Visit (HOSPITAL_COMMUNITY): Payer: Self-pay

## 2021-01-09 MED ORDER — HYDRALAZINE HCL 25 MG PO TABS
ORAL_TABLET | ORAL | 1 refills | Status: DC
  Filled 2021-01-09: qty 90, 90d supply, fill #0

## 2021-01-10 ENCOUNTER — Other Ambulatory Visit (HOSPITAL_COMMUNITY): Payer: Self-pay

## 2021-01-11 DIAGNOSIS — Z6825 Body mass index (BMI) 25.0-25.9, adult: Secondary | ICD-10-CM | POA: Diagnosis not present

## 2021-01-11 DIAGNOSIS — R300 Dysuria: Secondary | ICD-10-CM | POA: Diagnosis not present

## 2021-01-17 ENCOUNTER — Other Ambulatory Visit: Payer: 59

## 2021-01-17 DIAGNOSIS — Z113 Encounter for screening for infections with a predominantly sexual mode of transmission: Secondary | ICD-10-CM | POA: Diagnosis not present

## 2021-01-17 DIAGNOSIS — Z6824 Body mass index (BMI) 24.0-24.9, adult: Secondary | ICD-10-CM | POA: Diagnosis not present

## 2021-01-17 DIAGNOSIS — R300 Dysuria: Secondary | ICD-10-CM | POA: Diagnosis not present

## 2021-02-05 MED FILL — Diltiazem HCl Coated Beads Cap ER 24HR 300 MG: ORAL | 90 days supply | Qty: 90 | Fill #1 | Status: AC

## 2021-02-06 ENCOUNTER — Other Ambulatory Visit (HOSPITAL_COMMUNITY): Payer: Self-pay

## 2021-02-07 ENCOUNTER — Other Ambulatory Visit (HOSPITAL_COMMUNITY): Payer: Self-pay

## 2021-02-08 ENCOUNTER — Other Ambulatory Visit: Payer: Self-pay

## 2021-02-08 ENCOUNTER — Encounter: Payer: Self-pay | Admitting: Orthopaedic Surgery

## 2021-02-08 ENCOUNTER — Ambulatory Visit (INDEPENDENT_AMBULATORY_CARE_PROVIDER_SITE_OTHER): Payer: 59 | Admitting: Orthopaedic Surgery

## 2021-02-08 VITALS — Ht 63.0 in | Wt 151.0 lb

## 2021-02-08 DIAGNOSIS — M7712 Lateral epicondylitis, left elbow: Secondary | ICD-10-CM | POA: Diagnosis not present

## 2021-02-08 DIAGNOSIS — M25522 Pain in left elbow: Secondary | ICD-10-CM | POA: Diagnosis not present

## 2021-02-08 DIAGNOSIS — M7711 Lateral epicondylitis, right elbow: Secondary | ICD-10-CM | POA: Diagnosis not present

## 2021-02-08 NOTE — Progress Notes (Signed)
Office Visit Note   Patient: Rose Delgado           Date of Birth: [birth year]           MRN: [id number] Visit Date: 02/08/2021              Requested by: Caryl Bis, MD [address],  [place] [id number] PCP: Caryl Bis, MD   Assessment & Plan: Visit Diagnoses:  1. Left elbow pain   2. Bilateral tennis elbow     Plan: Recurrent symptoms of left tennis elbow with direct tenderness over the lateral epicondyle.  The pain is increased with grip on extension versus flexion.  Skin intact.  Neurologically intact.  Full range of motion of elbow.  Prior x-rays were negative.  I believe that her problem is related to the tennis elbow.  She has had good temporary relief with cortisone injections.  She has been using a tennis elbow band is still having compromise of her activities.  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  We will order an MRI scan and anticipate need for tennis elbow surgery.  Discussed this with her in detail  Follow-Up Instructions: Return We will schedule MRI scan left elbow.   Orders:  Orders Placed This Encounter  Procedures   MR Elbow Left w/o contrast   No orders of the defined types were placed in this encounter.     Procedures: No procedures performed   Clinical Data: No additional findings.   Subjective: Chief Complaint  Patient presents with   Left Elbow - Pain  Patient presents today for left elbow pain. She said that the pain radiates up to her shoulder. She has tried over the counter medicine but it does not help.  Has had prior lateral epicondyle injections with good temporary relief.  Last injection about 3 months ago  HPI  Review of Systems   Objective: Vital Signs: Ht '5\' 3"'$  (1.6 m)   Wt 151 lb (68.5 kg)   LMP 07/29/2017   BMI 26.75 kg/m   Physical Exam Constitutional:      Appearance: She is well-developed.  Eyes:     Pupils: Pupils are equal, round, and reactive to light.  Pulmonary:     Effort: Pulmonary  effort is normal.  Skin:    General: Skin is warm and dry.  Neurological:     Mental Status: She is alert and oriented to person, place, and time.  Psychiatric:        Behavior: Behavior normal.    Ortho Exam left elbow with full range of motion.  Local tenderness directly over the lateral epicondyle without skin changes.  Pain with grip mostly in extension.  No crepitation.  No ecchymosis or erythema.  Neurologically intact  Specialty Comments:  No specialty comments available.  Imaging: No results found.   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 02/16/2016   Nausea with vomiting 07/14/2014   Dysgeusia 03/11/2014   GERD (gastroesophageal reflux disease)  03/11/2014   Hot flashes 03/09/2014   Perimenopause 03/09/2014   Fibroids 03/09/2014   Past Medical History:  Diagnosis Date   Allergic rhinitis    Angio-edema    Fibroids    Irregular heart beat    Migraines    Perimenopause 03/09/2014   Reflux    Urticaria     Family History  Problem Relation Age of Onset   Other Mother        glaucoma; pacemaker   Hypertension Mother    Other Father        aneursym   Cancer Sister        biliary, deceased age 33   Hypertension Sister    Cancer Brother        lung   Hypertension Brother    Sleep apnea Brother    Hypertension Brother    Hypertension Brother    Hypertension Sister    Hypertension Sister    Hypertension Sister    Colon cancer Neg Hx    Allergic rhinitis Neg Hx     Asthma Neg Hx     Past Surgical History:  Procedure Laterality Date   ADENOIDECTOMY     BREAST BIOPSY Left 2008   benign   COLONOSCOPY  06/2011   Dr. Britta Mccreedy: per patient it was normal, follow up in 10 years.    ESOPHAGOGASTRODUODENOSCOPY     Dr. Berneta Sages: late 1990s/early 2000   ESOPHAGOGASTRODUODENOSCOPY N/A 05/19/2014   SLF:NO OBVIOUS SOURCE FOR DYSGEUSIA IDENTIFED/MILD Non-erosive gastritis   INCISION AND DRAINAGE Right 07/31/2017   Procedure: INCISION AND DRAINAGE RIGHT THUMB NAIL BED;  Surgeon: [place] Crumb, MD;  Location: [facility];  Service: Orthopedics;  Laterality: Right;   INGUINAL HERNIA REPAIR     KNEE SURGERY Right    NAILBED REPAIR Right 07/31/2017   Procedure: NAILBED BIOPSY;  Surgeon: [place] Crumb, MD;  Location: [facility];  Service: Orthopedics;  Laterality: Right;   TONSILLECTOMY     TONSILLECTOMY AND ADENOIDECTOMY     Social History   Occupational History   Occupation: Therapist, sports at [employer]: [employer]  Tobacco Use   Smoking status: Never   Smokeless tobacco: Never  Vaping Use   Vaping Use: Never used  Substance and Sexual Activity   Alcohol use: No   Drug use: No   Sexual activity: Yes    Birth control/protection: None, Post-menopausal

## 2021-02-13 ENCOUNTER — Other Ambulatory Visit (HOSPITAL_COMMUNITY): Payer: Self-pay

## 2021-02-13 ENCOUNTER — Ambulatory Visit (INDEPENDENT_AMBULATORY_CARE_PROVIDER_SITE_OTHER): Payer: 59 | Admitting: Cardiology

## 2021-02-13 VITALS — BP 124/68 | HR 68 | Ht 63.0 in | Wt 145.0 lb

## 2021-02-13 DIAGNOSIS — R002 Palpitations: Secondary | ICD-10-CM | POA: Diagnosis not present

## 2021-02-13 DIAGNOSIS — I10 Essential (primary) hypertension: Secondary | ICD-10-CM

## 2021-02-13 MED ORDER — HYDRALAZINE HCL 25 MG PO TABS
ORAL_TABLET | Freq: Two times a day (BID) | ORAL | 3 refills | Status: DC
  Filled 2021-02-13 – 2021-02-20 (×2): qty 180, 90d supply, fill #0
  Filled 2021-03-08: qty 60, 30d supply, fill #0

## 2021-02-13 NOTE — Progress Notes (Signed)
Clinical Summary Rose Delgado is a 60 y.o.female former patient of Dr Claudie Leach, this is our first visit together. Seen for the following medical problems.  Palpitations -prior monitoring showed no significant arrhythmias  - no recent symptoms. Has not not needed prn diltiazem      2.HTN - compliant with meds - compliant with meds   3.SOB -last visit reported ongoing SOB since she had covid in 2020 - 08/2020 echo LVEF >75%, no WMAs, indet diastolic, normal RV - breathing has improved. Still some taste issues after COVID   SH: she works in [employer] Past Medical History:  Diagnosis Date   Allergic rhinitis    Angio-edema    Fibroids    Irregular heart beat    Migraines    Perimenopause 03/09/2014   Reflux    Urticaria      Allergies  Allergen Reactions   Metoprolol Anaphylaxis   Shellfish Allergy Anaphylaxis   Flagyl [Metronidazole] Nausea And Vomiting   Lactose Intolerance (Gi) Nausea And Vomiting    And diarrhea   Losartan Swelling   Topamax [Topiramate] Other (See Comments)    "feeling of doom"   Ciprofloxacin Hcl Nausea Only   Diflucan [Fluconazole] Nausea And Vomiting   Latex Rash     Current Outpatient Medications  Medication Sig Dispense Refill   Azelastine HCl 137 MCG/SPRAY SOLN Place 1 spray into the nose daily. 90 mL 3   Biotin 5 MG CAPS Take 1 capsule by mouth daily.     cetirizine (ZYRTEC) 10 MG tablet TAKE 1 TABLET BY MOUTH ONCE A DAY 90 tablet 3   cyclobenzaprine (FLEXERIL) 10 MG tablet Take 1 tablet by mouth every 8 (eight) hours as needed.  0   diltiazem (CARDIZEM CD) 300 MG 24 hr capsule TAKE 1 CAPSULE BY MOUTH ONCE A DAY 90 capsule 3   diltiazem (CARDIZEM) 30 MG tablet TAKE 1 TABLET BY MOUTH TWICE A DAY AS NEEDED FOR BREAKTHROUGH PALPITATIONS. 30 tablet 6   EPINEPHrine 0.3 mg/0.3 mL IJ SOAJ injection Use as directed for severe allergic reaction. 2 each 2   famotidine (PEPCID) 20 MG tablet Take 1 tablet (20 mg total) by mouth 2  (two) times daily. 90 tablet 3   fluticasone (FLONASE) 50 MCG/ACT nasal spray USE 2 SPRAY INTO EACH NOSTRIL EVERY DAY 48 g 3   hydrALAZINE (APRESOLINE) 25 MG tablet TAKE 1 TABLET BY MOUTH 2 TIMES DAILY. 180 tablet 3   hydrALAZINE (APRESOLINE) 25 MG tablet Take 1 tablet by mouth every day 90 tablet 1   latanoprost (XALATAN) 0.005 % ophthalmic solution INSTILL 1 DROP INTO BOTH EYES AT BEDTIME 2.5 mL 3   Magnesium 250 MG TABS Take 1 tablet by mouth daily.     methocarbamol (ROBAXIN) 500 MG tablet Take 500 mg by mouth every 8 (eight) hours as needed.      montelukast (SINGULAIR) 10 MG tablet TAKE 1 TABLET BY MOUTH AT BEDTIME 90 tablet 3   Multiple Vitamins-Minerals (SM COMPLETE 50+) TABS Take 1 tablet by mouth daily.     nitrofurantoin, macrocrystal-monohydrate, (MACROBID) 100 MG capsule Take 1 capsule (100 mg total) by mouth 2 (two) times daily. 14 capsule 0   ondansetron (ZOFRAN) 4 MG tablet Take 1 tablet (4 mg total) by mouth every 4 (four) hours as needed for nausea or vomiting. 30 tablet 2   pantoprazole (PROTONIX) 40 MG tablet TAKE 1 TABLET BY MOUTH DAILY FOR ACID REFLUX 90 tablet 1   polyethylene glycol powder (  GLYCOLAX/MIRALAX) 17 GM/SCOOP powder DISSOLVE 17 GRAMS (1 CAPFUL) IN LIQUID AND DRINK BY MOUTH TWICE DAILY FOR CONSTIPATION 952 g 6   potassium chloride 20 MEQ TBCR Take 20 mEq by mouth daily. 90 tablet 3   Pyridoxine HCl (VITAMIN B6 PO) Take 1 tablet by mouth daily.     rizatriptan (MAXALT-MLT) 10 MG disintegrating tablet Take 10 mg by mouth as needed for migraine. May repeat in 2 hours if needed     vitamin E 100 UNIT capsule Take 100 Units by mouth daily.     No current facility-administered medications for this visit.     Past Surgical History:  Procedure Laterality Date   ADENOIDECTOMY     BREAST BIOPSY Left 2008   benign   COLONOSCOPY  06/2011   Dr. Britta Mccreedy: per patient it was normal, follow up in 10 years.    ESOPHAGOGASTRODUODENOSCOPY     Dr. Berneta Sages: late 1990s/early  2000   ESOPHAGOGASTRODUODENOSCOPY N/A 05/19/2014   SLF:NO OBVIOUS SOURCE FOR DYSGEUSIA IDENTIFED/MILD Non-erosive gastritis   INCISION AND DRAINAGE Right 07/31/2017   Procedure: INCISION AND DRAINAGE RIGHT THUMB NAIL BED;  Surgeon: [place] Crumb, MD;  Location: [facility];  Service: Orthopedics;  Laterality: Right;   INGUINAL HERNIA REPAIR     KNEE SURGERY Right    NAILBED REPAIR Right 07/31/2017   Procedure: NAILBED BIOPSY;  Surgeon: [place] Crumb, MD;  Location: [facility];  Service: Orthopedics;  Laterality: Right;   TONSILLECTOMY     TONSILLECTOMY AND ADENOIDECTOMY       Allergies  Allergen Reactions   Metoprolol Anaphylaxis   Shellfish Allergy Anaphylaxis   Flagyl [Metronidazole] Nausea And Vomiting   Lactose Intolerance (Gi) Nausea And Vomiting    And diarrhea   Losartan Swelling   Topamax [Topiramate] Other (See Comments)    "feeling of doom"   Ciprofloxacin Hcl Nausea Only   Diflucan [Fluconazole] Nausea And Vomiting   Latex Rash      Family History  Problem Relation Age of Onset   Other Mother        glaucoma; pacemaker   Hypertension Mother    Other Father        aneursym   Cancer Sister        biliary, deceased age 42   Hypertension Sister    Cancer Brother        lung   Hypertension Brother    Sleep apnea Brother    Hypertension Brother    Hypertension Brother    Hypertension Sister    Hypertension Sister    Hypertension Sister    Colon cancer Neg Hx    Allergic rhinitis Neg Hx    Asthma Neg Hx      Social History Ms. Munzer reports that she has never smoked. She has never used smokeless tobacco. Ms. Acker reports no history of alcohol use.   Review of Systems CONSTITUTIONAL: No weight loss, fever, chills, weakness or fatigue.  HEENT: Eyes: No visual loss, blurred vision, double vision or yellow sclerae.No hearing loss, sneezing, congestion, runny nose or sore throat.  SKIN: No rash or itching.   CARDIOVASCULAR: per hpi RESPIRATORY: No shortness of breath, cough or sputum.  GASTROINTESTINAL: No anorexia, nausea, vomiting or diarrhea. No abdominal pain or blood.  GENITOURINARY: No burning on urination, no polyuria NEUROLOGICAL: No headache, dizziness, syncope, paralysis, ataxia, numbness or tingling in the extremities. No change in bowel or bladder control.  MUSCULOSKELETAL: No muscle, back pain, joint pain or stiffness.  LYMPHATICS: No enlarged nodes. No history of splenectomy.  PSYCHIATRIC: No history of depression or anxiety.  ENDOCRINOLOGIC: No reports of sweating, cold or heat intolerance. No polyuria or polydipsia.  Marland Kitchen   Physical Examination Today's Vitals   02/13/21 0821  BP: 124/68  Pulse: 68  SpO2: 98%  Weight: 145 lb (65.8 kg)  Height: '5\' 3"'$  (1.6 m)   Body mass index is 25.69 kg/m.  Gen: resting comfortably, no acute distress HEENT: no scleral icterus, pupils equal round and reactive, no palptable cervical adenopathy,  CV: RRR, no m/r/g, no jvd Resp: Clear to auscultation bilaterally GI: abdomen is soft, non-tender, non-distended, normal bowel sounds, no hepatosplenomegaly MSK: extremities are warm, no edema.  Skin: warm, no rash Neuro:  no focal deficits Psych: appropriate affect     Assessment and Plan  Palpitations -symptoms controlled, continue diltiazem  2. HTN -a t goal, continue current meds      Arnoldo Lenis, M.D.

## 2021-02-13 NOTE — Patient Instructions (Signed)

## 2021-02-14 ENCOUNTER — Ambulatory Visit (HOSPITAL_COMMUNITY)
Admission: RE | Admit: 2021-02-14 | Discharge: 2021-02-14 | Disposition: A | Discharge status: Home / Self Care | Payer: 59 | Source: Ambulatory Visit | Attending: Orthopaedic Surgery | Admitting: Orthopaedic Surgery

## 2021-02-14 ENCOUNTER — Other Ambulatory Visit: Payer: Self-pay

## 2021-02-14 DIAGNOSIS — M25522 Pain in left elbow: Secondary | ICD-10-CM | POA: Insufficient documentation

## 2021-02-16 ENCOUNTER — Ambulatory Visit (HOSPITAL_COMMUNITY): Payer: 59

## 2021-02-20 ENCOUNTER — Other Ambulatory Visit (HOSPITAL_COMMUNITY): Payer: Self-pay

## 2021-02-20 MED ORDER — ERGOCALCIFEROL 1.25 MG (50000 UT) PO CAPS
ORAL_CAPSULE | ORAL | 4 refills | Status: DC
  Filled 2021-02-20: qty 24, 84d supply, fill #0

## 2021-02-27 ENCOUNTER — Other Ambulatory Visit (HOSPITAL_COMMUNITY): Payer: Self-pay

## 2021-02-27 DIAGNOSIS — Z6824 Body mass index (BMI) 24.0-24.9, adult: Secondary | ICD-10-CM | POA: Diagnosis not present

## 2021-02-27 DIAGNOSIS — M7710 Lateral epicondylitis, unspecified elbow: Secondary | ICD-10-CM | POA: Diagnosis not present

## 2021-02-27 MED ORDER — HYDRALAZINE HCL 25 MG PO TABS
ORAL_TABLET | ORAL | 1 refills | Status: DC
  Filled 2021-02-27: qty 90, 90d supply, fill #0

## 2021-03-08 ENCOUNTER — Other Ambulatory Visit (HOSPITAL_COMMUNITY): Payer: Self-pay

## 2021-03-15 ENCOUNTER — Other Ambulatory Visit: Payer: Self-pay

## 2021-03-15 ENCOUNTER — Ambulatory Visit (INDEPENDENT_AMBULATORY_CARE_PROVIDER_SITE_OTHER): Payer: 59 | Admitting: Orthopaedic Surgery

## 2021-03-15 ENCOUNTER — Encounter: Payer: Self-pay | Admitting: Orthopaedic Surgery

## 2021-03-15 VITALS — Ht 63.0 in | Wt 145.0 lb

## 2021-03-15 DIAGNOSIS — M7711 Lateral epicondylitis, right elbow: Secondary | ICD-10-CM | POA: Diagnosis not present

## 2021-03-15 DIAGNOSIS — M7712 Lateral epicondylitis, left elbow: Secondary | ICD-10-CM | POA: Diagnosis not present

## 2021-03-15 NOTE — Progress Notes (Signed)
Office Visit Note   Patient: Rose Delgado           Date of Birth: 05-May-1961           MRN: [id number] Visit Date: 03/15/2021              Requested by: Caryl Bis, MD [place],  [place] [id number] PCP: Caryl Bis, MD   Assessment & Plan: Visit Diagnoses:  1. Bilateral tennis elbow     Plan: MRI scan of left elbow demonstrates mild tendinosis of the common extensor tendons at the level of the lateral epicondyles.  This confirms her diagnosis of tennis elbow.  This marked has been caring for her older sister with metastatic cancer and has been absent from the emergency room.  accordingly, she is doing left lifting and pulling with less elbow discomfort.  Long discussion regarding the findings.  She will try Voltaren gel, wear the tennis elbow splint use ice or heat.  She is already had 2 cortisone injections so we will hold on further cortisone.  Be happy to see her back anytime in the future we discussed ongoing potential treatments even including tennis elbow release  Follow-Up Instructions: Return if symptoms worsen or fail to improve.   Orders:  No orders of the defined types were placed in this encounter.  No orders of the defined types were placed in this encounter.     Procedures: No procedures performed   Clinical Data: No additional findings.   Subjective: Chief Complaint  Patient presents with   Left Elbow - Follow-up    MRI review  Patient presents today for follow up on her left elbow. She had an MRI and is here for those results.  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  No numbness or tingling.  Pain is still localized along the lateral left elbow  HPI  Review of Systems   Objective: Vital Signs: Ht '5\' 3"'$  (1.6 m)   Wt 145 lb (65.8 kg)   LMP 07/29/2017   BMI 25.69 kg/m   Physical Exam Constitutional:      Appearance: She is well-developed.  Eyes:     Pupils: Pupils are equal, round, and  reactive to light.  Pulmonary:     Effort: Pulmonary effort is normal.  Skin:    General: Skin is warm and dry.  Neurological:     Mental Status: She is alert and oriented to person, place, and time.  Psychiatric:        Behavior: Behavior normal.    Ortho Exam left elbow with very minimal tenderness over the lateral epicondyle.  No skin change.  There is no ecchymosis or erythema.  No crepitation.  No instability.  Full range of motion.  Neurologically intact  Specialty Comments:  No specialty comments available.  Imaging: No results found.   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 02/16/2016   Nausea with vomiting 07/14/2014   Dysgeusia 03/11/2014  GERD (gastroesophageal reflux disease) 03/11/2014   Hot flashes 03/09/2014   Perimenopause 03/09/2014   Fibroids 03/09/2014   Past Medical History:  Diagnosis Date   Allergic rhinitis    Angio-edema    Fibroids    Irregular heart beat    Migraines    Perimenopause 03/09/2014   Reflux    Urticaria     Family History  Problem Relation Age of Onset   Other Mother        glaucoma; pacemaker   Hypertension Mother    Other Father        aneursym   Cancer Sister        biliary, deceased age 49   Hypertension Sister    Cancer Brother        lung   Hypertension Brother    Sleep apnea Brother    Hypertension Brother    Hypertension Brother    Hypertension Sister    Hypertension Sister    Hypertension Sister    Colon cancer  Neg Hx    Allergic rhinitis Neg Hx    Asthma Neg Hx     Past Surgical History:  Procedure Laterality Date   ADENOIDECTOMY     BREAST BIOPSY Left 2008   benign   COLONOSCOPY  06/2011   Dr. Britta Mccreedy: per patient it was normal, follow up in 10 years.    ESOPHAGOGASTRODUODENOSCOPY     Dr. Berneta Sages: late 1990s/early 2000   ESOPHAGOGASTRODUODENOSCOPY N/A 05/19/2014   SLF:NO OBVIOUS SOURCE FOR DYSGEUSIA IDENTIFED/MILD Non-erosive gastritis   INCISION AND DRAINAGE Right 07/31/2017   Procedure: INCISION AND DRAINAGE RIGHT THUMB NAIL BED;  Surgeon: [place] Crumb, MD;  Location: [facility];  Service: Orthopedics;  Laterality: Right;   INGUINAL HERNIA REPAIR     KNEE SURGERY Right    NAILBED REPAIR Right 07/31/2017   Procedure: NAILBED BIOPSY;  Surgeon: [place] Crumb, MD;  Location: [facility];  Service: Orthopedics;  Laterality: Right;   TONSILLECTOMY     TONSILLECTOMY AND ADENOIDECTOMY     Social History   Occupational History   Occupation: Therapist, sports at [employer]: [place]  Tobacco Use   Smoking status: Never   Smokeless tobacco: Never  Vaping Use   Vaping Use: Never used  Substance and Sexual Activity   Alcohol use: No   Drug use: No   Sexual activity: Yes    Birth control/protection: None, Post-menopausal

## 2021-03-17 ENCOUNTER — Telehealth: Payer: Self-pay | Admitting: Cardiology

## 2021-03-17 ENCOUNTER — Other Ambulatory Visit (HOSPITAL_COMMUNITY): Payer: Self-pay

## 2021-03-17 MED ORDER — HYDRALAZINE HCL 25 MG PO TABS
ORAL_TABLET | Freq: Two times a day (BID) | ORAL | 3 refills | Status: DC
  Filled 2021-03-17: qty 180, fill #0
  Filled 2021-03-27: qty 180, 90d supply, fill #0
  Filled 2021-07-04: qty 180, 90d supply, fill #1

## 2021-03-17 NOTE — Telephone Encounter (Signed)
New Patient   Needs new prescription sent to [facility] out patient [facility] - Dr [name] send the wrong prescription over and it cancelled out Dr Harl Bowie prescription - now her medication is all messed up   *STAT* If patient is at the pharmacy, call can be transferred to refill team.   1. Which medications need to be refilled? (please list name of each medication and dose if known)  hydrALAZINE (APRESOLINE) 25 MG tablet TAKE 1 TABLET BY MOUTH 2 TIMES DAILY.    2. Which pharmacy/location (including street and city if local pharmacy) is medication to be sent to? [facility]  3. Do they need a 30 day or 90 day supply? [place]

## 2021-03-17 NOTE — Telephone Encounter (Signed)
Per cardiology notes, pt is to take hydralazine 25 mg BID, sent to [facility]

## 2021-03-27 ENCOUNTER — Other Ambulatory Visit (HOSPITAL_COMMUNITY): Payer: Self-pay

## 2021-03-28 ENCOUNTER — Other Ambulatory Visit (HOSPITAL_COMMUNITY): Payer: Self-pay

## 2021-03-28 ENCOUNTER — Encounter (HOSPITAL_COMMUNITY): Payer: Self-pay | Admitting: *Deleted

## 2021-03-28 ENCOUNTER — Emergency Department (HOSPITAL_COMMUNITY)
Admission: EM | Admit: 2021-03-28 | Discharge: 2021-03-28 | Disposition: A | Discharge status: Home / Self Care | Payer: 59 | Attending: Emergency Medicine | Admitting: Emergency Medicine

## 2021-03-28 ENCOUNTER — Other Ambulatory Visit: Payer: Self-pay

## 2021-03-28 ENCOUNTER — Emergency Department (HOSPITAL_COMMUNITY): Payer: 59

## 2021-03-28 DIAGNOSIS — Z91040 Latex allergy status: Secondary | ICD-10-CM | POA: Insufficient documentation

## 2021-03-28 DIAGNOSIS — W1840XA Slipping, tripping and stumbling without falling, unspecified, initial encounter: Secondary | ICD-10-CM | POA: Diagnosis not present

## 2021-03-28 DIAGNOSIS — M25462 Effusion, left knee: Secondary | ICD-10-CM | POA: Diagnosis not present

## 2021-03-28 DIAGNOSIS — S83005A Unspecified dislocation of left patella, initial encounter: Secondary | ICD-10-CM | POA: Diagnosis not present

## 2021-03-28 DIAGNOSIS — S8992XA Unspecified injury of left lower leg, initial encounter: Secondary | ICD-10-CM | POA: Diagnosis not present

## 2021-03-28 DIAGNOSIS — S83095A Other dislocation of left patella, initial encounter: Secondary | ICD-10-CM | POA: Diagnosis not present

## 2021-03-28 MED ORDER — CELECOXIB 200 MG PO CAPS: 200.0000 mg | ORAL_CAPSULE | Freq: Two times a day (BID) | ORAL | 0 refills | Status: DC

## 2021-03-28 NOTE — Discharge Instructions (Addendum)
Contact a health care provider if: The pain in your knee gets worse and is not relieved by medicine. Your knee catches or locks. Get help right away if: Your patella slips out of its normal position again. The swelling in your knee gets worse.

## 2021-03-28 NOTE — ED Notes (Signed)
Knee immobilizer applied

## 2021-03-28 NOTE — ED Provider Notes (Signed)
[facility] EMERGENCY DEPARTMENT Provider Note   CSN: [id number] Arrival date & time: 03/28/21  1705     History Chief Complaint  Patient presents with   Knee Pain    Rose Delgado is a 60 y.o. female who presents emergency department with chief complaint of left knee pain.  Patient states that she slipped yesterday and her left patella dislocated laterally.  She was able to pop it back in herself.  Since that time she has had very severe knee pain which is worse with any movement of the knee.  She has been able to ambulate with a cane and bought an over-the-counter knee sleeve.  She has been taking Tylenol with only minimal relief of her pain.  She has had a history of the same but has been many years.   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 Britta Mccreedy: per patient it was normal, follow up in 10 years.    ESOPHAGOGASTRODUODENOSCOPY     Dr. Berneta Sages: late 1990s/early 2000    ESOPHAGOGASTRODUODENOSCOPY N/A 05/19/2014   SLF:NO OBVIOUS SOURCE FOR DYSGEUSIA IDENTIFED/MILD Non-erosive gastritis   INCISION AND DRAINAGE Right 07/31/2017   Procedure: INCISION AND DRAINAGE RIGHT THUMB NAIL BED;  Surgeon: Charlotte Crumb, MD;  Location: [place];  Service: Orthopedics;  Laterality: Right;   INGUINAL HERNIA REPAIR     KNEE SURGERY Right    NAILBED REPAIR Right 07/31/2017   Procedure: NAILBED BIOPSY;  Surgeon: Charlotte Crumb, MD;  Location: [place];  Service: Orthopedics;  Laterality: Right;   TONSILLECTOMY     TONSILLECTOMY AND ADENOIDECTOMY       OB History     Gravida  0   Para      Term      Preterm      AB      Living         SAB      IAB      Ectopic      Multiple      Live Births              Family History  Problem Relation Age of Onset   Other Mother        glaucoma; pacemaker   Hypertension Mother    Other Father        aneursym   Cancer Sister  biliary, deceased age 20   Hypertension Sister    Cancer Brother        lung   Hypertension Brother    Sleep apnea Brother    Hypertension Brother    Hypertension Brother    Hypertension Sister    Hypertension Sister    Hypertension Sister    Colon cancer Neg Hx    Allergic rhinitis Neg Hx    Asthma Neg Hx     Social History   Tobacco Use   Smoking status: Never   Smokeless tobacco: Never  Vaping Use   Vaping Use: Never used  Substance Use Topics   Alcohol use: No   Drug use: No    Home Medications Prior to Admission medications   Medication Sig Start Date End Date Taking? Authorizing Provider  Azelastine HCl 137 MCG/SPRAY SOLN Place 1 spray into the nose daily. 11/11/20 11/11/21  Valentina Shaggy, MD  Biotin 5 MG CAPS Take 1 capsule by mouth daily.    [provider]  cetirizine (ZYRTEC) 10 MG tablet TAKE 1 TABLET BY MOUTH ONCE A DAY 11/11/20 11/11/21  Valentina Shaggy, MD  cyclobenzaprine (FLEXERIL)  10 MG tablet Take 1 tablet by mouth every 8 (eight) hours as needed. 12/25/16   [provider]  diltiazem (CARDIZEM CD) 300 MG 24 hr capsule TAKE 1 CAPSULE BY MOUTH ONCE A DAY 08/11/20 08/11/21  Verta Ellen., NP  diltiazem (CARDIZEM) 30 MG tablet TAKE 1 TABLET BY MOUTH TWICE A DAY AS NEEDED FOR BREAKTHROUGH PALPITATIONS. 07/14/20 07/14/21  Verta Ellen., NP  EPINEPHrine 0.3 mg/0.3 mL IJ SOAJ injection Use as directed for severe allergic reaction. 11/11/20   Valentina Shaggy, MD  ergocalciferol (VITAMIN D2) 1.25 MG (50000 UT) capsule Take 2 capsules by mouth once a week 04/08/20     famotidine (PEPCID) 20 MG tablet Take 1 tablet (20 mg total) by mouth 2 (two) times daily. 11/11/20   Valentina Shaggy, MD  fluticasone [facility]) 50 MCG/ACT nasal spray USE 2 SPRAY INTO EACH NOSTRIL EVERY DAY 11/11/20 11/11/21  Valentina Shaggy, MD  hydrALAZINE (APRESOLINE) 25 MG tablet TAKE 1 TABLET BY MOUTH 2 TIMES DAILY. 03/17/21   Arnoldo Lenis, MD  latanoprost (XALATAN) 0.005 % ophthalmic solution INSTILL 1 DROP INTO BOTH EYES AT BEDTIME 07/08/20 07/08/21  Groat, Darlina Guys, MD  Magnesium 250 MG TABS Take 1 tablet by mouth daily.    [provider]  methocarbamol (ROBAXIN) 500 MG tablet Take 500 mg by mouth every 8 (eight) hours as needed.     [provider]  montelukast (SINGULAIR) 10 MG tablet TAKE 1 TABLET BY MOUTH AT BEDTIME 11/11/20 11/11/21  Valentina Shaggy, MD  Multiple Vitamins-Minerals (SM COMPLETE 50+) TABS Take 1 tablet by mouth daily. 06/30/18   [provider]  nitrofurantoin, macrocrystal-monohydrate, (MACROBID) 100 MG capsule Take 1 capsule (100 mg total) by mouth 2 (two) times daily. 12/28/20   Estill Dooms, NP  ondansetron (ZOFRAN) 4 MG tablet Take 1 tablet (4 mg total) by mouth every 4 (four) hours as needed for nausea or vomiting. 07/14/14   Fields, Marga Melnick, MD  pantoprazole (PROTONIX) 40 MG tablet TAKE 1 TABLET BY MOUTH DAILY FOR  ACID REFLUX 10/22/20     polyethylene glycol powder (GLYCOLAX/MIRALAX) 17 GM/SCOOP powder DISSOLVE 17 GRAMS (1 CAPFUL) IN LIQUID AND DRINK BY MOUTH TWICE DAILY FOR CONSTIPATION 08/01/20 08/01/21  Caryl Bis, MD  potassium chloride 20 MEQ TBCR Take  20 mEq by mouth daily. 02/10/20   Strader, Fransisco Hertz, PA-C  Pyridoxine HCl (VITAMIN B6 PO) Take 1 tablet by mouth daily.    [provider]  rizatriptan (MAXALT-MLT) 10 MG disintegrating tablet Take 10 mg by mouth as needed for migraine. May repeat in 2 hours if needed    [provider]  vitamin E 100 UNIT capsule Take 100 Units by mouth daily.    [provider]    Allergies    Metoprolol, Shellfish allergy, Flagyl [metronidazole], Lactose intolerance (gi), Losartan, Topamax [topiramate], Ciprofloxacin hcl, Diflucan [fluconazole], and Latex  Review of Systems   Review of Systems  Musculoskeletal:  Positive for gait problem. Negative for joint swelling.  Skin:  Negative for rash and wound.   Physical Exam Updated Vital Signs BP (!) 157/73 (BP Location: Right Arm)   Pulse 81   Temp 98.6 F (37 C) (Oral)   LMP 07/29/2017   SpO2 98%   Physical Exam Vitals and nursing note reviewed.  Constitutional:      General: She is not in acute distress.    Appearance: She is well-developed. She is not diaphoretic.  HENT:     Head: Normocephalic and atraumatic.     Right Ear: External ear normal.     Left Ear: External ear normal.     Nose: Nose normal.     Mouth/Throat:     Mouth: Mucous membranes are moist.  Eyes:     General: No scleral icterus.    Conjunctiva/sclera: Conjunctivae normal.  Cardiovascular:     Rate and Rhythm: Normal rate and regular rhythm.     Heart sounds: Normal heart sounds. No murmur heard.   No friction rub. No gallop.  Pulmonary:     Effort: Pulmonary effort is normal. No respiratory distress.     Breath sounds: Normal breath sounds.  Abdominal:     General: Bowel sounds are normal.  There is no distension.     Palpations: Abdomen is soft. There is no mass.     Tenderness: There is no abdominal tenderness. There is no guarding.  Musculoskeletal:     Cervical back: Normal range of motion.  Skin:    General: Skin is warm and dry.     Comments: Skin is warm over the patella.  She has tenderness along the joint line, patient able to flex and extend at the knee but range of motion is limited and painful with active and passive range of motion.  Neurological:     Mental Status: She is alert and oriented to person, place, and time.  Psychiatric:        Behavior: Behavior normal.    ED Results / Procedures / Treatments   Labs (all labs ordered are listed, but only abnormal results are displayed) Labs Reviewed - No data to display  EKG None  Radiology DG Knee Complete 4 Views Left  Result Date: 03/28/2021 CLINICAL DATA:  Left knee injury, pain and swelling EXAM: LEFT KNEE - COMPLETE 4+ VIEW COMPARISON:  08/22/2020 FINDINGS: Frontal, bilateral oblique, and lateral views of the left knee are obtained. No fracture, subluxation, or dislocation. Joint spaces are well preserved. Small joint effusion. IMPRESSION: 1. Small left knee effusion.  No acute bony abnormality. Electronically Signed   By: Randa Ngo M.D.   On: 03/28/2021 18:16    Procedures Procedures   Medications Ordered in ED Medications - No data to display  ED Course  I have reviewed the triage vital signs and  the nursing notes.  Pertinent labs & imaging results that were available during my care of the patient were reviewed by me and considered in my medical decision making (see chart for details).    MDM Rules/Calculators/A&P                           Patient here with knee injury.  I ordered and reviewed a left knee x-ray which shows mild effusion. Patient notes dislocation with spontaneous relocation of the left kneecap.  Patient placed in knee immobilizer.  She declines crutches.  Patient has a  cane which she feels will help her better.  Patient will be discharged with Celebrex for pain relief.  She is advised to follow-up with orthopedics.  Discussed return precautions. Final Clinical Impression(s) / ED Diagnoses Final diagnoses:  None    Rx / DC Orders ED Discharge Orders     None        Margarita Mail, PA-C 03/28/21 Layla Maw, MD 03/30/21 [phone]

## 2021-03-28 NOTE — ED Triage Notes (Signed)
Left knee injury

## 2021-04-10 ENCOUNTER — Other Ambulatory Visit (HOSPITAL_COMMUNITY): Payer: Self-pay

## 2021-04-10 MED ORDER — PANTOPRAZOLE SODIUM 40 MG PO TBEC
DELAYED_RELEASE_TABLET | ORAL | 1 refills | Status: DC
  Filled 2021-04-10: qty 90, 90d supply, fill #0
  Filled 2021-07-17: qty 90, 90d supply, fill #1

## 2021-04-12 ENCOUNTER — Other Ambulatory Visit (HOSPITAL_COMMUNITY): Payer: Self-pay

## 2021-04-12 ENCOUNTER — Telehealth: Payer: Self-pay | Admitting: Cardiology

## 2021-04-12 MED ORDER — POTASSIUM CHLORIDE ER 20 MEQ PO TBCR
20.0000 meq | EXTENDED_RELEASE_TABLET | Freq: Every day | ORAL | 3 refills | Status: DC
  Filled 2021-04-12: qty 90, 90d supply, fill #0
  Filled 2021-07-04: qty 90, 90d supply, fill #1
  Filled 2022-03-04: qty 90, 90d supply, fill #2

## 2021-04-12 NOTE — Telephone Encounter (Signed)
Complete

## 2021-04-12 NOTE — Telephone Encounter (Signed)
*  STAT* If patient is at the pharmacy, call can be transferred to refill team.   1. Which medications need to be refilled? (please list name of each medication and dose if known)    potassium chloride 20 MEQ TBCR [[id number]]   2. Which pharmacy/location (including street and city if local pharmacy) is medication to be sent to?   [facility] out patient pharmacy   3. Do they need a 30 day or 90 day supply?   90 day

## 2021-04-26 ENCOUNTER — Ambulatory Visit (INDEPENDENT_AMBULATORY_CARE_PROVIDER_SITE_OTHER): Payer: 59 | Admitting: Orthopedic Surgery

## 2021-04-26 ENCOUNTER — Ambulatory Visit (HOSPITAL_COMMUNITY)
Admission: RE | Admit: 2021-04-26 | Discharge: 2021-04-26 | Disposition: A | Discharge status: Home / Self Care | Payer: 59 | Source: Ambulatory Visit | Attending: Orthopedic Surgery | Admitting: Orthopedic Surgery

## 2021-04-26 ENCOUNTER — Other Ambulatory Visit: Payer: Self-pay

## 2021-04-26 ENCOUNTER — Encounter: Payer: Self-pay | Admitting: Orthopedic Surgery

## 2021-04-26 VITALS — BP 174/87 | HR 85 | Ht 63.0 in | Wt 145.0 lb

## 2021-04-26 DIAGNOSIS — S83242A Other tear of medial meniscus, current injury, left knee, initial encounter: Secondary | ICD-10-CM | POA: Diagnosis not present

## 2021-04-26 DIAGNOSIS — S8992XA Unspecified injury of left lower leg, initial encounter: Secondary | ICD-10-CM | POA: Insufficient documentation

## 2021-04-26 DIAGNOSIS — S83282A Other tear of lateral meniscus, current injury, left knee, initial encounter: Secondary | ICD-10-CM | POA: Diagnosis not present

## 2021-04-26 DIAGNOSIS — S83512A Sprain of anterior cruciate ligament of left knee, initial encounter: Secondary | ICD-10-CM | POA: Diagnosis not present

## 2021-04-26 DIAGNOSIS — M25462 Effusion, left knee: Secondary | ICD-10-CM | POA: Diagnosis not present

## 2021-04-26 NOTE — Progress Notes (Signed)
New Patient Visit  Assessment: Rose Delgado is a 60 y.o. female with the following: 1. Knee injury, left, initial encounter  Plan: Patient sustained a twisting injury to her left knee approximately 3-4 weeks ago.  She continues to have pain, and episodes of instability.  On physical exam, she does have some pain with stress of the MCL, with a positive Lachman test, with a questionable endpoint.  As a result, I recommended an MRI of the left knee.  Once the MRI is complete, we can meet to review the results together.  She can continue with her work, wearing the brace as needed.  Medications as needed.   Follow-up: Return for After MRI.  Subjective:  Chief Complaint  Patient presents with   Knee Injury   Elbow Pain    Lt knee approx 3 wks ago. Pt states she slipped on a rock and knee popped out of place, states kneecap still sore and there's a knot with pain at the back of knee since.     History of Present Illness: Rose Delgado is a 60 y.o. female who presents for evaluation of left knee pain.  Approximately 3-4 weeks ago, she slipped on a wet rock, and twisted her knee.  She said that there was a strong valgus force on her left knee.  She thinks that her kneecap may have dislocated.  She immediately reposition her leg, and has had pain ever since.  She is been wearing a brace.  She continues to work.  She does note some instability, especially walking up and down stairs.  She takes medications occasionally.  No other treatments for this injury.  She states that she had an injury to one of her knees when she was a teenager, but she cannot remember the details.   Review of Systems: No fevers or chills No numbness or tingling No chest pain No shortness of breath No bowel or bladder dysfunction No GI distress No headaches   Medical History:  Past Medical History:  Diagnosis Date   Allergic rhinitis    Angio-edema    Fibroids    Irregular heart beat    Migraines     Perimenopause 03/09/2014   Reflux    Urticaria     Past Surgical History:  Procedure Laterality Date   ADENOIDECTOMY     BREAST BIOPSY Left 2008   benign   COLONOSCOPY  06/2011   Dr. Britta Mccreedy: per patient it was normal, follow up in 10 years.    ESOPHAGOGASTRODUODENOSCOPY     Dr. Berneta Sages: late 1990s/early 2000   ESOPHAGOGASTRODUODENOSCOPY N/A 05/19/2014   SLF:NO OBVIOUS SOURCE FOR DYSGEUSIA IDENTIFED/MILD Non-erosive gastritis   INCISION AND DRAINAGE Right 07/31/2017   Procedure: INCISION AND DRAINAGE RIGHT THUMB NAIL BED;  Surgeon: Charlotte Crumb, MD;  Location: [place];  Service: Orthopedics;  Laterality: Right;   INGUINAL HERNIA REPAIR     KNEE SURGERY Right    NAILBED REPAIR Right 07/31/2017   Procedure: NAILBED BIOPSY;  Surgeon: Charlotte Crumb, MD;  Location: [place];  Service: Orthopedics;  Laterality: Right;   TONSILLECTOMY     TONSILLECTOMY AND ADENOIDECTOMY      Family History  Problem Relation Age of Onset   Other Mother        glaucoma; pacemaker   Hypertension Mother    Other Father        aneursym   Cancer Sister        biliary, deceased age 58  Hypertension Sister    Cancer Brother        lung   Hypertension Brother    Sleep apnea Brother    Hypertension Brother    Hypertension Brother    Hypertension Sister    Hypertension Sister    Hypertension Sister    Colon cancer Neg Hx    Allergic rhinitis Neg Hx    Asthma Neg Hx    Social History   Tobacco Use   Smoking status: Never   Smokeless tobacco: Never  Vaping Use   Vaping Use: Never used  Substance Use Topics   Alcohol use: No   Drug use: No    Allergies  Allergen Reactions   Metoprolol Anaphylaxis   Shellfish Allergy Anaphylaxis   Flagyl [Metronidazole] Nausea And Vomiting   Lactose Intolerance (Gi) Nausea And Vomiting    And diarrhea   Losartan Swelling   Topamax [Topiramate] Other (See Comments)    "feeling of doom"   Ciprofloxacin Hcl Nausea  Only   Diflucan [Fluconazole] Nausea And Vomiting   Latex Rash    No outpatient medications have been marked as taking for the 04/26/21 encounter (Office Visit) with Mordecai Rasmussen, MD.    Objective: BP (!) 174/87   Pulse 85   Ht 5\' 3"  (1.6 m)   Wt 145 lb (65.8 kg)   LMP 07/29/2017   BMI 25.69 kg/m   Physical Exam:  General: Alert and oriented. and No acute distress. Gait: Left sided antalgic gait.  Evaluation left knee demonstrates a small effusion.  She has some tenderness to palpation along the medial patella.  No obvious laxity varus or valgus stress at full extension, and 30 degrees of flexion.  She does have some tenderness at the insertion of the MCL.  This discomfort is recreated with valgus force.  Grade 1 Lachman, with a soft endpoint.  She does not tolerate pivot shift testing.  Equivocal with anterior drawer testing.  Range of motion from 0-130 degrees.  IMAGING: I personally reviewed images previously obtained from the ED  X-ray of the left knee from the emergency department demonstrates no acute injury.  Minimal degenerative changes.  New Medications:  No orders of the defined types were placed in this encounter.     Mordecai Rasmussen, MD  04/26/2021 11:54 AM

## 2021-04-28 ENCOUNTER — Encounter: Payer: Self-pay | Admitting: Orthopedic Surgery

## 2021-04-28 ENCOUNTER — Other Ambulatory Visit: Payer: Self-pay

## 2021-04-28 ENCOUNTER — Ambulatory Visit (INDEPENDENT_AMBULATORY_CARE_PROVIDER_SITE_OTHER): Payer: 59 | Admitting: Orthopedic Surgery

## 2021-04-28 DIAGNOSIS — S83512D Sprain of anterior cruciate ligament of left knee, subsequent encounter: Secondary | ICD-10-CM | POA: Diagnosis not present

## 2021-04-28 MED ORDER — CELECOXIB 100 MG PO CAPS: 100.0000 mg | ORAL_CAPSULE | Freq: Two times a day (BID) | ORAL | 0 refills | Status: AC

## 2021-04-28 NOTE — Progress Notes (Addendum)
Orthopaedic Clinic Return  Assessment: Rose Delgado is a 60 y.o. female with the following: Left knee ACL tear, and medial meniscus tear; mild MCL injury  Plan: Reviewed the MRI findings with the patient in clinic today.  She is potentially interested in ACL reconstruction, and this was briefly discussed today.  However, she is not interested in proceeding with surgery until sometime in the new year.  I think this is reasonable.  In the meantime, we will have her fitted for a brace, and have her work with physical therapy.  It is possible, that this is all she needs.  If her knee stabilizes, and the pain continues to improve, without episodes of instability, she may be able to proceed with nonoperative management.  However, if she does continue to have episodes of instability, and is interested in pursuing surgery, I would recommend ACL reconstruction, to include quadriceps tendon versus hamstring tendon autograft.  All questions were answered, she is amenable with this plan.  I provided her with a prescription for Celebrex as well.  We will plan to see her early in the new year, but she can return to clinic at any time if she has any issues.  More specifically, I have recommended a custom knee brace due to decreased quadriceps to calf muscle ratio.  This will provide the most secure fit and give her the best chance possible to have a successful rehab following her injury.  Meds ordered this encounter  Medications   celecoxib (CELEBREX) 100 MG capsule    Sig: Take 1 capsule (100 mg total) by mouth 2 (two) times daily.    Dispense:  60 capsule    Refill:  0    There is no height or weight on file to calculate BMI.  Follow-up: Return in about 2 months (around 07/10/2021).   Subjective:  Chief Complaint  Patient presents with   Results    MRI LT knee    History of Present Illness: Rose Delgado is a 60 y.o. female who returns to clinic for repeat evaluation of left knee pain.  She  was seen in clinic earlier this week, and has obtained an MRI for her left knee.  She returns today for further discussion.  She continues to have pain in the left knee.  Celebrex has helped in the past.  Otherwise, she is unable to take many NSAIDs due to GI issues.  She does wear a brace, but this has not been helpful, and she has tried several different braces.  She does continue to have episodes of instability.  Review of Systems: No fevers or chills No numbness or tingling No chest pain No shortness of breath No bowel or bladder dysfunction No GI distress No headaches  Objective: LMP 07/29/2017   Physical Exam:  Alert and oriented.  No acute distress.  Left-sided antalgic gait, wearing a brace on the left knee.  Mild effusion of her left knee.  Tenderness to palpation over the insertion of the MCL.  She does have tenderness to palpation along the medial joint line.  2+ Lachman.  No increased laxity to varus or valgus stress.  She is able to get her knee to full extension.  She sits comfortably with her knee at 90 degrees of flexion.  IMAGING: I personally ordered and reviewed the following images:  Left knee MRI  IMPRESSION: 1. Complete ACL tear. 2. Oblique tear of the posterior horn-body junction of the medial meniscus extending into the posterior horn. 3.  Small tear along the superior surface of the anterior horn of the lateral meniscus. 4. Osseous contusion of the posterolateral tibial plateau, anterolateral femoral condyle and posteromedial tibial plateau. 5. Mild edema superficial to the MCL concerning for mild MCL strain without a tear.  Mordecai Rasmussen, MD 04/28/2021 8:04 PM

## 2021-05-01 ENCOUNTER — Telehealth: Payer: Self-pay | Admitting: Orthopedic Surgery

## 2021-05-01 ENCOUNTER — Other Ambulatory Visit (HOSPITAL_COMMUNITY): Payer: Self-pay

## 2021-05-01 MED FILL — Polyethylene Glycol 3350 Oral Powder 17 GM/SCOOP: ORAL | 28 days supply | Qty: 952 | Fill #0 | Status: CN

## 2021-05-01 MED FILL — Diltiazem HCl Coated Beads Cap ER 24HR 300 MG: ORAL | 90 days supply | Qty: 90 | Fill #2 | Status: AC

## 2021-05-01 NOTE — Telephone Encounter (Signed)
Patient called to relay that the brace she was given at office visit on Friday 04/28/21, is too big and is sliding down leg when at work. Said she will work with it until Monday 05/08/21 as she is to meet with brace representative Chrys Racer then.

## 2021-05-02 ENCOUNTER — Telehealth: Payer: Self-pay | Admitting: Radiology

## 2021-05-02 ENCOUNTER — Other Ambulatory Visit (HOSPITAL_COMMUNITY): Payer: Self-pay

## 2021-05-02 NOTE — Telephone Encounter (Signed)
Patient called, LM and asked that you call her back to discuss possibly exchanging a brace.

## 2021-05-02 NOTE — Telephone Encounter (Signed)
Spoke with pt about current brace, she will come by to try to get a new one.

## 2021-05-04 ENCOUNTER — Other Ambulatory Visit (HOSPITAL_COMMUNITY): Payer: Self-pay

## 2021-05-10 ENCOUNTER — Ambulatory Visit (HOSPITAL_COMMUNITY): Payer: 59

## 2021-05-15 ENCOUNTER — Other Ambulatory Visit: Payer: Self-pay

## 2021-05-15 ENCOUNTER — Ambulatory Visit (HOSPITAL_COMMUNITY): Payer: 59 | Attending: Orthopedic Surgery | Admitting: Physical Therapy

## 2021-05-15 ENCOUNTER — Encounter (HOSPITAL_COMMUNITY): Payer: Self-pay | Admitting: Physical Therapy

## 2021-05-15 ENCOUNTER — Telehealth: Payer: Self-pay | Admitting: Radiology

## 2021-05-15 DIAGNOSIS — M6281 Muscle weakness (generalized): Secondary | ICD-10-CM | POA: Diagnosis not present

## 2021-05-15 DIAGNOSIS — M25562 Pain in left knee: Secondary | ICD-10-CM | POA: Diagnosis not present

## 2021-05-15 DIAGNOSIS — S83512D Sprain of anterior cruciate ligament of left knee, subsequent encounter: Secondary | ICD-10-CM | POA: Diagnosis not present

## 2021-05-15 NOTE — Telephone Encounter (Signed)
Rose Delgado with [employer] stopped by needs addendum for brace ordered to support need for custom brace, due to quad to calf ratio.

## 2021-05-15 NOTE — Patient Instructions (Signed)
Access Code: [email or web address] URL: [email or web address] Date: 05/15/2021 Prepared by: Josue Hector  Exercises Supine Quad Set - 2 x daily - 7 x weekly - 2 sets - 10 reps - 3 second hold Supine Bridge - 2 x daily - 7 x weekly - 2 sets - 10 reps - 3 second hold Sidelying Hip Abduction - 2 x daily - 7 x weekly - 2 sets - 10 reps - 3 second hold Standing Heel Raise with Support - 2 x daily - 7 x weekly - 2 sets - 10 reps
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
## 2021-05-29 DIAGNOSIS — S83512D Sprain of anterior cruciate ligament of left knee, subsequent encounter: Secondary | ICD-10-CM | POA: Diagnosis not present

## 2021-06-08 ENCOUNTER — Ambulatory Visit (HOSPITAL_COMMUNITY): Payer: 59 | Admitting: Physical Therapy

## 2021-06-09 DIAGNOSIS — G43909 Migraine, unspecified, not intractable, without status migrainosus: Secondary | ICD-10-CM | POA: Diagnosis not present

## 2021-06-09 DIAGNOSIS — Z23 Encounter for immunization: Secondary | ICD-10-CM | POA: Diagnosis not present

## 2021-06-09 DIAGNOSIS — J301 Allergic rhinitis due to pollen: Secondary | ICD-10-CM | POA: Diagnosis not present

## 2021-06-09 DIAGNOSIS — E7849 Other hyperlipidemia: Secondary | ICD-10-CM | POA: Diagnosis not present

## 2021-06-09 DIAGNOSIS — I10 Essential (primary) hypertension: Secondary | ICD-10-CM | POA: Diagnosis not present

## 2021-06-09 DIAGNOSIS — E559 Vitamin D deficiency, unspecified: Secondary | ICD-10-CM | POA: Diagnosis not present

## 2021-06-09 DIAGNOSIS — Z6824 Body mass index (BMI) 24.0-24.9, adult: Secondary | ICD-10-CM | POA: Diagnosis not present

## 2021-06-14 ENCOUNTER — Encounter (HOSPITAL_COMMUNITY): Payer: 59

## 2021-06-19 ENCOUNTER — Encounter (HOSPITAL_COMMUNITY): Payer: 59 | Admitting: Physical Therapy

## 2021-06-20 ENCOUNTER — Telehealth: Payer: Self-pay

## 2021-06-20 MED ORDER — CELECOXIB 100 MG PO CAPS: 100.0000 mg | ORAL_CAPSULE | Freq: Two times a day (BID) | ORAL | 0 refills | Status: DC

## 2021-06-20 NOTE — Telephone Encounter (Signed)
Patient called back and states she has to pay $25.00 at the pharmacy she requested.    Now she wants it sent to [facility]  Please call her back at [phone]

## 2021-06-20 NOTE — Telephone Encounter (Signed)
Patient is asking for a refill of Celebrex   PATIENT USES [facility]

## 2021-06-21 ENCOUNTER — Other Ambulatory Visit (HOSPITAL_COMMUNITY): Payer: Self-pay

## 2021-06-21 MED ORDER — CELECOXIB 100 MG PO CAPS
100.0000 mg | ORAL_CAPSULE | Freq: Two times a day (BID) | ORAL | 0 refills | Status: AC
  Filled 2021-06-21: qty 60, 30d supply, fill #0

## 2021-06-21 NOTE — Telephone Encounter (Signed)
Patient called stating [facility] is not covered with insurance.   Would like Celebrex sent to [facility] please

## 2021-06-22 NOTE — Telephone Encounter (Signed)
Called pt and let her know all of the steps taken as far as her medication refill. Verbalized understanding and she will call with any further questions or concerns.

## 2021-06-22 NOTE — Telephone Encounter (Signed)
Patient left another message, as noted 06/21/21, she would like to switch to [facility]; states [facility] states it needs pre-authorization. (?[facility] may also?)  Patient ph# [phone]

## 2021-06-23 ENCOUNTER — Other Ambulatory Visit: Payer: Self-pay

## 2021-06-23 DIAGNOSIS — S83512D Sprain of anterior cruciate ligament of left knee, subsequent encounter: Secondary | ICD-10-CM

## 2021-06-27 ENCOUNTER — Other Ambulatory Visit (HOSPITAL_COMMUNITY): Payer: Self-pay

## 2021-06-27 DIAGNOSIS — Z1211 Encounter for screening for malignant neoplasm of colon: Secondary | ICD-10-CM | POA: Diagnosis not present

## 2021-06-28 DIAGNOSIS — H40023 Open angle with borderline findings, high risk, bilateral: Secondary | ICD-10-CM | POA: Diagnosis not present

## 2021-06-28 DIAGNOSIS — H2000 Unspecified acute and subacute iridocyclitis: Secondary | ICD-10-CM | POA: Diagnosis not present

## 2021-06-30 ENCOUNTER — Ambulatory Visit (HOSPITAL_COMMUNITY)
Admission: RE | Admit: 2021-06-30 | Discharge: 2021-06-30 | Disposition: A | Discharge status: Home / Self Care | Payer: 59 | Source: Ambulatory Visit | Attending: Obstetrics & Gynecology | Admitting: Obstetrics & Gynecology

## 2021-06-30 ENCOUNTER — Other Ambulatory Visit: Payer: Self-pay

## 2021-06-30 ENCOUNTER — Encounter (HOSPITAL_COMMUNITY): Payer: 59

## 2021-06-30 DIAGNOSIS — Z01419 Encounter for gynecological examination (general) (routine) without abnormal findings: Secondary | ICD-10-CM | POA: Diagnosis not present

## 2021-06-30 DIAGNOSIS — Z1231 Encounter for screening mammogram for malignant neoplasm of breast: Secondary | ICD-10-CM | POA: Diagnosis not present

## 2021-07-04 ENCOUNTER — Telehealth: Payer: Self-pay | Admitting: Orthopedic Surgery

## 2021-07-04 ENCOUNTER — Other Ambulatory Visit (HOSPITAL_COMMUNITY): Payer: Self-pay

## 2021-07-04 MED FILL — Polyethylene Glycol 3350 Oral Powder 17 GM/SCOOP: ORAL | 15 days supply | Qty: 238 | Fill #0 | Status: AC

## 2021-07-04 NOTE — Telephone Encounter (Signed)
Pt would like to know if she can get a note for work tomorrow. Pt has appointment with Dr. Marlou Sa 07/06/21, but doesn't feel she can complete her shift tomorrow due to increase pain. Please advise.

## 2021-07-04 NOTE — Telephone Encounter (Signed)
Patient called at 1:33 pm left voicemail stating she wants to talk with Dr. Amedeo Kinsman nurse.   Please call her back at [phone]

## 2021-07-06 ENCOUNTER — Ambulatory Visit: Payer: 59 | Admitting: Orthopedic Surgery

## 2021-07-06 ENCOUNTER — Other Ambulatory Visit: Payer: Self-pay

## 2021-07-06 DIAGNOSIS — S83512D Sprain of anterior cruciate ligament of left knee, subsequent encounter: Secondary | ICD-10-CM | POA: Diagnosis not present

## 2021-07-07 ENCOUNTER — Encounter (HOSPITAL_COMMUNITY): Payer: Self-pay | Admitting: Orthopedic Surgery

## 2021-07-07 ENCOUNTER — Ambulatory Visit: Payer: 59 | Admitting: Orthopedic Surgery

## 2021-07-07 NOTE — Progress Notes (Signed)
DUE TO COVID-19 ONLY ONE VISITOR IS ALLOWED TO COME WITH YOU AND STAY IN THE WAITING ROOM ONLY DURING PRE OP AND PROCEDURE DAY OF SURGERY.   PCP - Dr [name] Cardiologist - Dr Carlyle Dolly Allergy & Asthma - Gareth Morgan, FNP  Chest x-ray - n/a EKG - DOS Stress Test - Several Yrs Ago ECHO - 08/03/20 Cardiac Cath - n/a  ICD Pacemaker/Loop - n/a  Sleep Study -  n/a CPAP - none  Anesthesia review:   STOP now taking any Aspirin (unless otherwise instructed by your surgeon), Aleve, Naproxen, Ibuprofen, Motrin, Advil, Goody's, BC's, all herbal medications, fish oil, and all vitamins.   Coronavirus Screening Covid test n/a Ambulatory Surgery  Do you have any of the following symptoms:  Cough yes/no: No Fever (>100.66F)  yes/no: No Runny nose yes/no: No Sore throat yes/no: No Difficulty breathing/shortness of breath  yes/no: No  Have you traveled in the last 14 days and where? yes/no: No  Patient verbalized understanding of instructions that were given via phone.

## 2021-07-10 DIAGNOSIS — Z01818 Encounter for other preprocedural examination: Secondary | ICD-10-CM

## 2021-07-10 HISTORY — DX: Presence of automatic (implantable) cardiac defibrillator: Z95.810

## 2021-07-11 ENCOUNTER — Ambulatory Visit: Payer: 59 | Admitting: Orthopedic Surgery

## 2021-07-11 ENCOUNTER — Encounter: Payer: Self-pay | Admitting: Orthopedic Surgery

## 2021-07-11 ENCOUNTER — Telehealth: Payer: Self-pay | Admitting: Orthopedic Surgery

## 2021-07-11 NOTE — Telephone Encounter (Signed)
[employer] forms received. To [employer]. 
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
## 2021-07-17 ENCOUNTER — Other Ambulatory Visit: Payer: Self-pay

## 2021-07-17 ENCOUNTER — Encounter (HOSPITAL_COMMUNITY): Payer: Self-pay | Admitting: Orthopedic Surgery

## 2021-07-17 ENCOUNTER — Other Ambulatory Visit (HOSPITAL_COMMUNITY): Payer: Self-pay

## 2021-07-17 NOTE — Progress Notes (Signed)
Pt's surgery rescheduled by surgeon.  All medication instructions remain the same from previous phone call, NPO after midnight, clears liquids until 11:45am, shower/brush teeth prior to arrival, no changes to health history/medication. Verbalized understanding of arrival time, all questions answered.

## 2021-07-18 ENCOUNTER — Other Ambulatory Visit: Payer: Self-pay

## 2021-07-18 ENCOUNTER — Other Ambulatory Visit: Payer: Self-pay | Admitting: Surgical

## 2021-07-18 ENCOUNTER — Encounter: Payer: Self-pay | Admitting: Orthopedic Surgery

## 2021-07-18 ENCOUNTER — Encounter (HOSPITAL_COMMUNITY): Payer: Self-pay | Admitting: Orthopedic Surgery

## 2021-07-18 ENCOUNTER — Encounter (HOSPITAL_COMMUNITY)
Admission: RE | Disposition: A | Discharge status: Home / Self Care | Payer: Self-pay | Source: Home / Self Care | Attending: Orthopedic Surgery

## 2021-07-18 ENCOUNTER — Ambulatory Visit (HOSPITAL_COMMUNITY)
Admission: RE | Admit: 2021-07-18 | Discharge: 2021-07-18 | Disposition: A | Discharge status: Home / Self Care | Payer: 59 | Attending: Orthopedic Surgery | Admitting: Orthopedic Surgery

## 2021-07-18 ENCOUNTER — Encounter (HOSPITAL_COMMUNITY): Payer: Self-pay | Admitting: Anesthesiology

## 2021-07-18 ENCOUNTER — Ambulatory Visit (HOSPITAL_COMMUNITY): Payer: Self-pay

## 2021-07-18 ENCOUNTER — Ambulatory Visit (HOSPITAL_COMMUNITY): Payer: Self-pay | Admitting: Anesthesiology

## 2021-07-18 DIAGNOSIS — S83242A Other tear of medial meniscus, current injury, left knee, initial encounter: Secondary | ICD-10-CM | POA: Insufficient documentation

## 2021-07-18 DIAGNOSIS — S83512A Sprain of anterior cruciate ligament of left knee, initial encounter: Secondary | ICD-10-CM | POA: Diagnosis not present

## 2021-07-18 DIAGNOSIS — X58XXXA Exposure to other specified factors, initial encounter: Secondary | ICD-10-CM | POA: Insufficient documentation

## 2021-07-18 DIAGNOSIS — Z01818 Encounter for other preprocedural examination: Secondary | ICD-10-CM

## 2021-07-18 DIAGNOSIS — I10 Essential (primary) hypertension: Secondary | ICD-10-CM | POA: Diagnosis not present

## 2021-07-18 DIAGNOSIS — S83512D Sprain of anterior cruciate ligament of left knee, subsequent encounter: Secondary | ICD-10-CM

## 2021-07-18 DIAGNOSIS — K219 Gastro-esophageal reflux disease without esophagitis: Secondary | ICD-10-CM | POA: Diagnosis not present

## 2021-07-18 DIAGNOSIS — Y93I9 Activity, other involving external motion: Secondary | ICD-10-CM | POA: Diagnosis not present

## 2021-07-18 DIAGNOSIS — S83242D Other tear of medial meniscus, current injury, left knee, subsequent encounter: Secondary | ICD-10-CM

## 2021-07-18 DIAGNOSIS — M25562 Pain in left knee: Secondary | ICD-10-CM | POA: Diagnosis not present

## 2021-07-18 DIAGNOSIS — S8992XA Unspecified injury of left lower leg, initial encounter: Secondary | ICD-10-CM | POA: Diagnosis not present

## 2021-07-18 DIAGNOSIS — G8918 Other acute postprocedural pain: Secondary | ICD-10-CM | POA: Diagnosis not present

## 2021-07-18 HISTORY — DX: Essential (primary) hypertension: I10

## 2021-07-18 HISTORY — DX: Gastro-esophageal reflux disease without esophagitis: K21.9

## 2021-07-18 HISTORY — PX: ANTERIOR CRUCIATE LIGAMENT REPAIR: SHX115

## 2021-07-18 LAB — CBC
HCT: 42.4 % (ref 36.0–46.0)
Hemoglobin: 14.1 g/dL (ref 12.0–15.0)
MCH: 31.3 pg (ref 26.0–34.0)
MCHC: 33.3 g/dL (ref 30.0–36.0)
MCV: 94.2 fL (ref 80.0–100.0)
Platelets: 276 10*3/uL (ref 150–400)
RBC: 4.5 MIL/uL (ref 3.87–5.11)
RDW: 12 % (ref 11.5–15.5)
WBC: 6.8 10*3/uL (ref 4.0–10.5)
nRBC: 0 % (ref 0.0–0.2)

## 2021-07-18 LAB — BASIC METABOLIC PANEL
Anion gap: 7 (ref 5–15)
BUN: 8 mg/dL (ref 6–20)
CO2: 25 mmol/L (ref 22–32)
Calcium: 9.8 mg/dL (ref 8.9–10.3)
Chloride: 105 mmol/L (ref 98–111)
Creatinine, Ser: 0.84 mg/dL (ref 0.44–1.00)
GFR, Estimated: >60 mL/min (ref >60)
Glucose, Bld: 91 mg/dL (ref 70–99)
Potassium: 3.8 mmol/L (ref 3.5–5.1)
Sodium: 137 mmol/L (ref 135–145)

## 2021-07-18 SURGERY — RECONSTRUCTION, KNEE, ACL, USING HAMSTRING GRAFT
Anesthesia: General | Site: Knee | Laterality: Left

## 2021-07-18 MED ORDER — ORAL CARE MOUTH RINSE: 15.0000 mL | Freq: Once | OROMUCOSAL | Status: AC

## 2021-07-18 MED ORDER — PROPOFOL 10 MG/ML IV BOLUS
INTRAVENOUS | Status: DC | PRN
  Administered 2021-07-18: 150 mg via INTRAVENOUS

## 2021-07-18 MED ORDER — MIDAZOLAM HCL 2 MG/2ML IJ SOLN: 1.0000 mg | Freq: Once | INTRAMUSCULAR | Status: AC

## 2021-07-18 MED ORDER — CEFAZOLIN SODIUM-DEXTROSE 2-4 GM/100ML-% IV SOLN
2.0000 g | INTRAVENOUS | Status: AC
  Administered 2021-07-18: 2 g via INTRAVENOUS

## 2021-07-18 MED ORDER — ASPIRIN 81 MG PO CHEW: 81.0000 mg | CHEWABLE_TABLET | Freq: Every day | ORAL | 0 refills | Status: DC

## 2021-07-18 MED ORDER — CLONIDINE HCL (ANALGESIA) 100 MCG/ML EP SOLN
EPIDURAL | Status: DC | PRN
  Administered 2021-07-18: 1 mL

## 2021-07-18 MED ORDER — CEFAZOLIN SODIUM-DEXTROSE 2-4 GM/100ML-% IV SOLN
INTRAVENOUS | Status: AC
  Filled 2021-07-18: qty 100

## 2021-07-18 MED ORDER — CHLORHEXIDINE GLUCONATE 0.12 % MT SOLN
OROMUCOSAL | Status: AC
  Administered 2021-07-18: 15 mL via OROMUCOSAL
  Filled 2021-07-18: qty 15

## 2021-07-18 MED ORDER — 0.9 % SODIUM CHLORIDE (POUR BTL) OPTIME
TOPICAL | Status: DC | PRN
  Administered 2021-07-18: 1000 mL

## 2021-07-18 MED ORDER — LIDOCAINE 2% (20 MG/ML) 5 ML SYRINGE
INTRAMUSCULAR | Status: AC
  Filled 2021-07-18: qty 5

## 2021-07-18 MED ORDER — FENTANYL CITRATE (PF) 250 MCG/5ML IJ SOLN
INTRAMUSCULAR | Status: DC | PRN
Start: 2021-07-18 — End: 2021-07-18
  Administered 2021-07-18: 75 ug via INTRAVENOUS

## 2021-07-18 MED ORDER — MIDAZOLAM HCL 2 MG/2ML IJ SOLN
INTRAMUSCULAR | Status: AC
  Administered 2021-07-18: 1 mg via INTRAVENOUS
  Filled 2021-07-18: qty 2

## 2021-07-18 MED ORDER — BUPIVACAINE-EPINEPHRINE (PF) 0.25% -1:200000 IJ SOLN
INTRAMUSCULAR | Status: AC
  Filled 2021-07-18: qty 30

## 2021-07-18 MED ORDER — OXYCODONE HCL 5 MG PO TABS: 5.0000 mg | ORAL_TABLET | Freq: Once | ORAL | Status: AC | PRN

## 2021-07-18 MED ORDER — FENTANYL CITRATE (PF) 100 MCG/2ML IJ SOLN: 25.0000 ug | INTRAMUSCULAR | Status: DC | PRN

## 2021-07-18 MED ORDER — FENTANYL CITRATE (PF) 250 MCG/5ML IJ SOLN
INTRAMUSCULAR | Status: AC
  Filled 2021-07-18: qty 5

## 2021-07-18 MED ORDER — ROPIVACAINE HCL 5 MG/ML IJ SOLN: INTRAMUSCULAR | Status: DC | PRN

## 2021-07-18 MED ORDER — BUPIVACAINE HCL (PF) 0.25 % IJ SOLN
INTRAMUSCULAR | Status: AC
  Filled 2021-07-18: qty 30

## 2021-07-18 MED ORDER — VANCOMYCIN HCL 1000 MG IV SOLR
INTRAVENOUS | Status: AC
  Filled 2021-07-18: qty 20

## 2021-07-18 MED ORDER — BUPIVACAINE-EPINEPHRINE 0.25% -1:200000 IJ SOLN
INTRAMUSCULAR | Status: DC | PRN
  Administered 2021-07-18: 20 mL
  Administered 2021-07-18: 10 mL

## 2021-07-18 MED ORDER — CLONIDINE HCL (ANALGESIA) 100 MCG/ML EP SOLN
EPIDURAL | Status: DC | PRN
  Administered 2021-07-18: 80 ug

## 2021-07-18 MED ORDER — LACTATED RINGERS IV SOLN: INTRAVENOUS | Status: DC

## 2021-07-18 MED ORDER — MORPHINE SULFATE (PF) 4 MG/ML IV SOLN
INTRAVENOUS | Status: DC | PRN
  Administered 2021-07-18: 8 mg via INTRAMUSCULAR

## 2021-07-18 MED ORDER — MIDAZOLAM HCL 2 MG/2ML IJ SOLN
INTRAMUSCULAR | Status: AC
  Filled 2021-07-18: qty 2

## 2021-07-18 MED ORDER — CELECOXIB 200 MG PO CAPS
200.0000 mg | ORAL_CAPSULE | Freq: Once | ORAL | Status: AC
  Administered 2021-07-18: 200 mg via ORAL
  Filled 2021-07-18: qty 1

## 2021-07-18 MED ORDER — EPINEPHRINE PF 1 MG/ML IJ SOLN
INTRAMUSCULAR | Status: AC
  Filled 2021-07-18: qty 2

## 2021-07-18 MED ORDER — FENTANYL CITRATE (PF) 100 MCG/2ML IJ SOLN: 50.0000 ug | Freq: Once | INTRAMUSCULAR | Status: AC

## 2021-07-18 MED ORDER — DEXAMETHASONE SODIUM PHOSPHATE 4 MG/ML IJ SOLN
INTRAMUSCULAR | Status: DC | PRN
Start: 2021-07-18 — End: 2021-07-18
  Administered 2021-07-18: 5 mg via PERINEURAL

## 2021-07-18 MED ORDER — MEPERIDINE HCL 25 MG/ML IJ SOLN: 6.2500 mg | INTRAMUSCULAR | Status: DC | PRN

## 2021-07-18 MED ORDER — GABAPENTIN 300 MG PO CAPS: 300.0000 mg | ORAL_CAPSULE | Freq: Three times a day (TID) | ORAL | 0 refills | Status: DC

## 2021-07-18 MED ORDER — LIDOCAINE 2% (20 MG/ML) 5 ML SYRINGE
INTRAMUSCULAR | Status: DC | PRN
  Administered 2021-07-18: 80 mg via INTRAVENOUS

## 2021-07-18 MED ORDER — ONDANSETRON HCL 4 MG/2ML IJ SOLN
INTRAMUSCULAR | Status: DC | PRN
  Administered 2021-07-18: 4 mg via INTRAVENOUS

## 2021-07-18 MED ORDER — DEXAMETHASONE SODIUM PHOSPHATE 10 MG/ML IJ SOLN
INTRAMUSCULAR | Status: AC
  Filled 2021-07-18: qty 1

## 2021-07-18 MED ORDER — DEXAMETHASONE SODIUM PHOSPHATE 10 MG/ML IJ SOLN
INTRAMUSCULAR | Status: DC | PRN
Start: 2021-07-18 — End: 2021-07-18
  Administered 2021-07-18: 10 mg via INTRAVENOUS

## 2021-07-18 MED ORDER — ACETAMINOPHEN 500 MG PO TABS
1000.0000 mg | ORAL_TABLET | Freq: Once | ORAL | Status: AC
  Administered 2021-07-18: 1000 mg via ORAL
  Filled 2021-07-18: qty 2

## 2021-07-18 MED ORDER — MUPIROCIN 2 % EX OINT
TOPICAL_OINTMENT | CUTANEOUS | Status: AC
  Administered 2021-07-18: 1 via TOPICAL
  Filled 2021-07-18: qty 22

## 2021-07-18 MED ORDER — VANCOMYCIN HCL 1000 MG IV SOLR
INTRAVENOUS | Status: DC | PRN
  Administered 2021-07-18: 1000 mg

## 2021-07-18 MED ORDER — POVIDONE-IODINE 7.5 % EX SOLN: Freq: Once | CUTANEOUS | Status: DC

## 2021-07-18 MED ORDER — OXYCODONE HCL 5 MG/5ML PO SOLN
ORAL | Status: AC
  Filled 2021-07-18: qty 5

## 2021-07-18 MED ORDER — PROMETHAZINE HCL 25 MG/ML IJ SOLN: 6.2500 mg | INTRAMUSCULAR | Status: DC | PRN

## 2021-07-18 MED ORDER — MUPIROCIN 2 % EX OINT: 1.0000 "application " | TOPICAL_OINTMENT | Freq: Once | CUTANEOUS | Status: AC

## 2021-07-18 MED ORDER — POVIDONE-IODINE 10 % EX SWAB: 2.0000 "application " | Freq: Once | CUTANEOUS | Status: DC

## 2021-07-18 MED ORDER — OXYCODONE-ACETAMINOPHEN 5-325 MG PO TABS: 1.0000 | ORAL_TABLET | ORAL | 0 refills | Status: DC | PRN

## 2021-07-18 MED ORDER — SODIUM CHLORIDE 0.9 % IR SOLN
Status: DC | PRN
  Administered 2021-07-18 (×4): 1 mL

## 2021-07-18 MED ORDER — MORPHINE SULFATE (PF) 4 MG/ML IV SOLN
INTRAVENOUS | Status: AC
  Filled 2021-07-18: qty 2

## 2021-07-18 MED ORDER — ONDANSETRON HCL 4 MG/2ML IJ SOLN
INTRAMUSCULAR | Status: AC
  Filled 2021-07-18: qty 2

## 2021-07-18 MED ORDER — SODIUM CHLORIDE 0.9 % IR SOLN
Status: DC | PRN
  Administered 2021-07-18 (×4): 3000 mL
  Administered 2021-07-18: 6000 mL
  Administered 2021-07-18: 3000 mL

## 2021-07-18 MED ORDER — CHLORHEXIDINE GLUCONATE 0.12 % MT SOLN: 15.0000 mL | Freq: Once | OROMUCOSAL | Status: AC

## 2021-07-18 MED ORDER — PHENYLEPHRINE 40 MCG/ML (10ML) SYRINGE FOR IV PUSH (FOR BLOOD PRESSURE SUPPORT)
PREFILLED_SYRINGE | INTRAVENOUS | Status: DC | PRN
  Administered 2021-07-18 (×4): 80 ug via INTRAVENOUS

## 2021-07-18 MED ORDER — ROPIVACAINE HCL 5 MG/ML IJ SOLN
INTRAMUSCULAR | Status: DC | PRN
  Administered 2021-07-18: 30 mL via PERINEURAL

## 2021-07-18 MED ORDER — OXYCODONE HCL 5 MG/5ML PO SOLN
5.0000 mg | Freq: Once | ORAL | Status: AC | PRN
Start: 2021-07-18 — End: 2021-07-18
  Administered 2021-07-18: 5 mg via ORAL

## 2021-07-18 MED ORDER — FENTANYL CITRATE (PF) 100 MCG/2ML IJ SOLN
INTRAMUSCULAR | Status: AC
  Administered 2021-07-18: 50 ug via INTRAVENOUS
  Filled 2021-07-18: qty 2

## 2021-07-18 SURGICAL SUPPLY — 106 items
ALCOHOL 70% 16 OZ (MISCELLANEOUS) ×2 IMPLANT
ANCHOR BUTTON TIGHTROPE 14 (Anchor) ×1 IMPLANT
ANCHOR TIGHTROPE II 20 (Anchor) ×1 IMPLANT
ANCHOR TIGHTROPE II 20 W/IB (Anchor) ×1 IMPLANT
BAG COUNTER SPONGE SURGICOUNT (BAG) ×2 IMPLANT
BAG SPNG CNTER NS LX DISP (BAG) ×1
BANDAGE ESMARK 6X9 LF (GAUZE/BANDAGES/DRESSINGS) IMPLANT
BLADE EXCALIBUR 4.0X13 (MISCELLANEOUS) ×1 IMPLANT
BLADE SHAVER TORPEDO 4X13 (MISCELLANEOUS) ×1 IMPLANT
BLADE SURG 10 STRL SS (BLADE) ×2 IMPLANT
BLADE SURG 15 STRL LF DISP TIS (BLADE) ×2 IMPLANT
BLADE SURG 15 STRL SS (BLADE) ×4
BNDG CMPR 9X6 STRL LF SNTH (GAUZE/BANDAGES/DRESSINGS)
BNDG CMPR MED 10X6 ELC LF (GAUZE/BANDAGES/DRESSINGS) ×1
BNDG ELASTIC 6X10 VLCR STRL LF (GAUZE/BANDAGES/DRESSINGS) ×1 IMPLANT
BNDG ESMARK 6X9 LF (GAUZE/BANDAGES/DRESSINGS)
BURR OVAL 8 FLU 4.0X13 (MISCELLANEOUS) ×1 IMPLANT
COOLER ICEMAN CLASSIC (MISCELLANEOUS) ×1 IMPLANT
COVER MAYO STAND STRL (DRAPES) ×2 IMPLANT
COVER SURGICAL LIGHT HANDLE (MISCELLANEOUS) ×2 IMPLANT
CUFF TOURN SGL QUICK 34 (TOURNIQUET CUFF)
CUFF TRNQT CYL 34X4.125X (TOURNIQUET CUFF) IMPLANT
DECANTER SPIKE VIAL GLASS SM (MISCELLANEOUS) ×2 IMPLANT
DISSECTOR 4.0MM X 13CM (MISCELLANEOUS) ×2 IMPLANT
DRAPE ARTHROSCOPY W/POUCH 114 (DRAPES) ×2 IMPLANT
DRAPE INCISE IOBAN 66X45 STRL (DRAPES) ×2 IMPLANT
DRAPE OEC MINIVIEW 54X84 (DRAPES) IMPLANT
DRAPE ORTHO SPLIT 77X108 STRL (DRAPES) ×2
DRAPE SURG ORHT 6 SPLT 77X108 (DRAPES) ×1 IMPLANT
DRAPE U-SHAPE 47X51 STRL (DRAPES) ×2 IMPLANT
DRILL FLIPCUTTER II 7.5MM (INSTRUMENTS) IMPLANT
DRILL FLIPCUTTER II 8.0MM (INSTRUMENTS) IMPLANT
DRILL FLIPCUTTER II 8.5MM (INSTRUMENTS) IMPLANT
DRILL FLIPCUTTER II 9.0MM (INSTRUMENTS) IMPLANT
DRILL FLIPCUTTER III 6-12 (ORTHOPEDIC DISPOSABLE SUPPLIES) IMPLANT
DRSG PAD ABDOMINAL 8X10 ST (GAUZE/BANDAGES/DRESSINGS) ×2 IMPLANT
DRSG TEGADERM 4X4.75 (GAUZE/BANDAGES/DRESSINGS) ×9 IMPLANT
DURAPREP 26ML APPLICATOR (WOUND CARE) ×4 IMPLANT
DW OUTFLOW CASSETTE/TUBE SET (MISCELLANEOUS) ×2 IMPLANT
ELECT REM PT RETURN 9FT ADLT (ELECTROSURGICAL) ×2
ELECTRODE REM PT RTRN 9FT ADLT (ELECTROSURGICAL) ×1 IMPLANT
FLIPCUTTER II 7.5MM (INSTRUMENTS)
FLIPCUTTER II 8.0MM (INSTRUMENTS)
FLIPCUTTER II 8.5MM (INSTRUMENTS)
FLIPCUTTER II 9.0MM (INSTRUMENTS)
FLIPCUTTER III 6-12 AR-1204FF (ORTHOPEDIC DISPOSABLE SUPPLIES) ×2
GAUZE SPONGE 4X4 12PLY STRL (GAUZE/BANDAGES/DRESSINGS) ×2 IMPLANT
GAUZE SPONGE 4X4 12PLY STRL LF (GAUZE/BANDAGES/DRESSINGS) ×2 IMPLANT
GAUZE XEROFORM 1X8 LF (GAUZE/BANDAGES/DRESSINGS) ×4 IMPLANT
GAUZE XEROFORM 5X9 LF (GAUZE/BANDAGES/DRESSINGS) ×1 IMPLANT
GLOVE SRG 8 PF TXTR STRL LF DI (GLOVE) ×1 IMPLANT
GLOVE SURG LTX SZ8 (GLOVE) ×2 IMPLANT
GLOVE SURG ORTHO LTX SZ7.5 (GLOVE) ×2 IMPLANT
GLOVE SURG UNDER POLY LF SZ8 (GLOVE) ×2
GOWN STRL REUS W/ TWL LRG LVL3 (GOWN DISPOSABLE) ×3 IMPLANT
GOWN STRL REUS W/TWL LRG LVL3 (GOWN DISPOSABLE) ×6
IMMOBILIZER KNEE 22 UNIV (SOFTGOODS) ×2 IMPLANT
KIT BASIN OR (CUSTOM PROCEDURE TRAY) ×2 IMPLANT
KIT BIOCARTILAGE DEL W/SYRINGE (KITS) IMPLANT
KIT BIOCARTILAGE LG JOINT MIX (KITS) ×2 IMPLANT
KIT ROOT REPAIR MEINISCAL PEEK (Anchor) IMPLANT
KIT TURNOVER KIT B (KITS) ×2 IMPLANT
MANIFOLD NEPTUNE II (INSTRUMENTS) ×2 IMPLANT
MEINISCAL ROOT REPAIR KIT PEEK (Anchor) ×4 IMPLANT
NDL 18GX1X1/2 (RX/OR ONLY) (NEEDLE) ×1 IMPLANT
NDL HYPO 18GX1.5 BLUNT FILL (NEEDLE) ×1 IMPLANT
NEEDLE 18GX1X1/2 (RX/OR ONLY) (NEEDLE) ×2 IMPLANT
NEEDLE HYPO 18GX1.5 BLUNT FILL (NEEDLE) ×2 IMPLANT
NS IRRIG 1000ML POUR BTL (IV SOLUTION) ×2 IMPLANT
PACK ARTHROSCOPY DSU (CUSTOM PROCEDURE TRAY) ×2 IMPLANT
PAD ARMBOARD 7.5X6 YLW CONV (MISCELLANEOUS) ×4 IMPLANT
PAD CAST 4YDX4 CTTN HI CHSV (CAST SUPPLIES) IMPLANT
PADDING CAST COTTON 4X4 STRL (CAST SUPPLIES) ×2
PADDING CAST COTTON 6X4 STRL (CAST SUPPLIES) ×1 IMPLANT
PENCIL BUTTON HOLSTER BLD 10FT (ELECTRODE) ×2 IMPLANT
PUTTY DBM ALLOSYNC PURE 5CC (Putty) ×1 IMPLANT
SPONGE T-LAP 18X18 ~~LOC~~+RFID (SPONGE) ×2 IMPLANT
SPONGE T-LAP 4X18 ~~LOC~~+RFID (SPONGE) ×4 IMPLANT
STRIP CLOSURE SKIN 1/2X4 (GAUZE/BANDAGES/DRESSINGS) ×2 IMPLANT
SUCTION FRAZIER HANDLE 10FR (MISCELLANEOUS) ×2
SUCTION TUBE FRAZIER 10FR DISP (MISCELLANEOUS) ×1 IMPLANT
SUT 0 FIBERLOOP 38 BLUE TPR ND (SUTURE) ×4
SUT ETHILON 3 0 PS 1 (SUTURE) ×6 IMPLANT
SUT MNCRL AB 3-0 PS2 18 (SUTURE) ×2 IMPLANT
SUT VIC AB 0 CT1 27 (SUTURE) ×2
SUT VIC AB 0 CT1 27XBRD ANBCTR (SUTURE) ×1 IMPLANT
SUT VIC AB 1 CT1 27 (SUTURE) ×2
SUT VIC AB 1 CT1 27XBRD ANBCTR (SUTURE) IMPLANT
SUT VIC AB 2-0 CT1 27 (SUTURE) ×2
SUT VIC AB 2-0 CT1 TAPERPNT 27 (SUTURE) ×1 IMPLANT
SUT VICRYL 0 UR6 27IN ABS (SUTURE) ×2 IMPLANT
SUTURE 0 FIBERLP 38 BLU TPR ND (SUTURE) IMPLANT
SUTURE TAPE 1.3 FIBERLOP 20 ST (SUTURE) IMPLANT
SUTURE TAPE TIGERLINK 1.3MM BL (SUTURE) IMPLANT
SUTURETAPE 1.3 FIBERLOOP 20 ST (SUTURE) ×2
SUTURETAPE TIGERLINK 1.3MM BL (SUTURE) ×2
SYR 30ML LL (SYRINGE) ×2 IMPLANT
SYR 3ML LL SCALE MARK (SYRINGE) ×2 IMPLANT
SYR BULB IRRIG 60ML STRL (SYRINGE) ×2 IMPLANT
SYR TB 1ML LUER SLIP (SYRINGE) ×2 IMPLANT
TOWEL GREEN STERILE (TOWEL DISPOSABLE) ×2 IMPLANT
TOWEL GREEN STERILE FF (TOWEL DISPOSABLE) ×2 IMPLANT
TUBING ARTHROSCOPY IRRIG 16FT (MISCELLANEOUS) ×2 IMPLANT
UNDERPAD 30X36 HEAVY ABSORB (UNDERPADS AND DIAPERS) ×2 IMPLANT
WRAP KNEE MAXI GEL POST OP (GAUZE/BANDAGES/DRESSINGS) ×2 IMPLANT
YANKAUER SUCT BULB TIP NO VENT (SUCTIONS) ×2 IMPLANT
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
## 2021-07-18 NOTE — Brief Op Note (Signed)
° °  07/18/2021  6:24 PM  PATIENT:  Rose Delgado  61 y.o. female  PRE-OPERATIVE DIAGNOSIS:  left knee anterior cruciate ligament tear, mediall meniscal tears  POST-OPERATIVE DIAGNOSIS:  left knee anterior cruciate ligament tear, medial meniscal tears  PROCEDURE:  Procedure(s): LEFT KNEE ANTERIOR CRUCIATE LIGAMENT RECONSTRUCTION, HAMSTRING AUTOGRAFT, MENISCAL DEBRIDEMENT  SURGEON:  Surgeon(s): Marlou Sa, Tonna Corner, MD  ASSISTANT: magnant pa  ANESTHESIA:   general  EBL: 10 ml    Total I/O In: 1300 [I.V.:1200; IV Piggyback:100] Out: 10 [Blood:10]  BLOOD ADMINISTERED: none  DRAINS: none   LOCAL MEDICATIONS USED: Marcaine morphine clonidine  SPECIMEN:  No Specimen  COUNTS:  YES  TOURNIQUET:  * Missing tourniquet times found for documented tourniquets in log: [id number] *  DICTATION: .Other Dictation: Dictation Number [id number]  PLAN OF CARE: Discharge to home after PACU  PATIENT DISPOSITION:  PACU - hemodynamically stable

## 2021-07-18 NOTE — Anesthesia Postprocedure Evaluation (Signed)
Anesthesia Post Note  Patient: Rose Delgado  Procedure(s) Performed: LEFT KNEE ANTERIOR CRUCIATE LIGAMENT RECONSTRUCTION, HAMSTRING AUTOGRAFT, MENISCAL DEBRIDEMENT (Left: Knee)     Patient location during evaluation: PACU Anesthesia Type: General Level of consciousness: sedated Pain management: pain level controlled Vital Signs Assessment: post-procedure vital signs reviewed and stable Respiratory status: spontaneous breathing and respiratory function stable Cardiovascular status: stable Postop Assessment: no apparent nausea or vomiting Anesthetic complications: no   No notable events documented.  Last Vitals:  Vitals:   07/18/21 1850 07/18/21 1905  BP: 123/67 124/74  Pulse: 87 85  Resp: 18 18  Temp:  36.7 C  SpO2: 97% 98%    Last Pain:  Vitals:   07/18/21 1905  TempSrc:   PainSc: 0-No pain                 Sadey Yandell DANIEL

## 2021-07-18 NOTE — Op Note (Signed)
NAME: Rose Delgado, Rose Delgado MEDICAL RECORD NO: [id number] ACCOUNT NO: [id number] DATE OF BIRTH: 1961-05-09 FACILITY: [facility] LOCATION: [facility] PHYSICIAN: Yetta Barre. Marlou Sa, MD  Operative Report   DATE OF PROCEDURE: 07/18/2021  PREOPERATIVE DIAGNOSIS:  Left knee anterior cruciate ligament tear, medial meniscal tear.  POSTOPERATIVE DIAGNOSES:  Left knee anterior cruciate ligament tear, medial meniscal tear.  PROCEDURE:  Left knee anterior cruciate ligament reconstruction using hamstring autograft, dual Endobutton technique fixation from Arthrex and partial medial meniscectomy.  SURGEON ATTENDING:  Yetta Barre. Marlou Sa, MD.  ASSISTANT:  Annie Main, PA.  INDICATIONS:  [name] is a 61 year old patient with left knee instability and pain following injury.  She presents now for operative management after explanation of risks and benefits.  DESCRIPTION OF PROCEDURE:  The patient was brought to the operating room where general anesthetic was induced.  Preoperative antibiotics were administered.  Timeout was called.  Left leg examined under anesthesia and found to have full extension as well  as flexion to 125.  ACL laxity present with positive anterior drawer with no posterolateral rotatory instability.  PCL intact.  Collaterals were stable to varus and valgus stress at 0 and 30 degrees.  Left leg was prescrubbed with alcohol, Betadine, and  allowed to air dry, prepped with DuraPrep solution and draped in a sterile manner.  Ioban used to cover the operative field.  A timeout was called.  Portals were anesthetized using 5 mL of Marcaine with epinephrine.  Next, incision made over the pes  bursa tendons.  Skin and subcutaneous tissue were sharply divided.  The semitendinosus and gracilis tendons were harvested using hamstring harvester.  Care was taken to avoid injury to the saphenous nerve.  Prepared on the back table using dual  Endobutton technique by Annie Main to a size 10 x 70 mm graft.  Concurrently, the  incision was thoroughly irrigated and packed with a moist sponge.  Anterior inferolateral portal was then established, anterior inferomedial portal established under  direct visualization.  Diagnostic arthroscopy was performed.  The patient had tear of the posterior horn of the medial meniscus involving about 60% anterior, posterior width of the meniscus.  This was debrided back to a stable rim using a combination of  basket punch and shaver.  About grade 1 chondromalacia covered about 30-40% of the weightbearing surface area of the medial femoral condyle and about 20% on the medial tibial plateau.  ACL was gone.  Stump debrided.  Notchplasty performed.  Lateral  compartment was inspected and the patient did not have any lateral meniscal pathology.  Patellofemoral compartment intact.  After completing the notchplasty, over the top position was identified and using a FlipCutter, the tunnel was drilled with about a  1.5 mm back wall in the 3 o'clock position on that lateral femoral condyle.  The tunnel was about 20-25 mm.  Tibial tunnel was then drilled, which was about 50 mm.  Graft was then passed and using fluoroscopic guidance, flipping of the button was  confirmed and down onto the bone.  Both tunnels were filled with bone graft.  We obtained 20 mm of graft in the femoral tunnel and about 20 in the tibial tunnel.  Graft was tightened in extension over an Endobutton and backed up with SwiveLock.  We also  used an internal brace, which was also tightened in extension.  Excellent stability was obtained.  No impingement.  Thorough irrigation of all incisions as well as the knee joint was performed.  The donor site was closed using #1  Vicryl suture followed  by 2-0 Vicryl suture and 3-0 nylon.  Portals were closed using 2-0 Vicryl, 3-0 nylon.  Incision for the lateral aspect of the tunnel drilling was closed using 0 Vicryl suture, 2-0 Vicryl suture, and 3-0 nylon.  A solution of Marcaine, morphine, clonidine   injected into the knee joint.  The patient tolerated the procedure well without immediate complications.  Bulky impervious dressings followed by bulky wrap, iceman and knee immobilizer were placed.  She will be weightbearing as tolerated.  Luke's  assistance was required for opening, closing, mobilization of tissue and graft preparation.  His assistance was a medical necessity.   MUK D: 07/18/2021 6:30:27 pm T: 07/18/2021 10:56:00 pm  JOB: [id number]/ [id number]

## 2021-07-18 NOTE — Anesthesia Preprocedure Evaluation (Addendum)
Anesthesia Evaluation  Patient identified by MRN, date of birth, ID band Patient awake    Reviewed: Allergy & Precautions, NPO status , Patient's Chart, lab work & pertinent test results  History of Anesthesia Complications Negative for: history of anesthetic complications  Airway Mallampati: II  TM Distance: >3 FB Neck ROM: Full    Dental no notable dental hx. (+) Dental Advisory Given, Teeth Intact   Pulmonary neg pulmonary ROS,    Pulmonary exam normal breath sounds clear to auscultation       Cardiovascular hypertension, Pt. on medications Normal cardiovascular exam+ dysrhythmias  Rhythm:Regular Rate:Normal     Neuro/Psych  Headaches, negative psych ROS   GI/Hepatic Neg liver ROS, GERD  Medicated and Controlled,  Endo/Other  negative endocrine ROS  Renal/GU negative Renal ROS     Musculoskeletal  (+) Arthritis ,   Abdominal   Peds  Hematology negative hematology ROS (+)   Anesthesia Other Findings   Reproductive/Obstetrics (+) Pregnancy                          Anesthesia Physical  Anesthesia Plan  ASA: 2  Anesthesia Plan: General   Post-op Pain Management: Regional block, Tylenol PO (pre-op), Minimal or no pain anticipated and Celebrex PO (pre-op)   Induction:   PONV Risk Score and Plan: 2 and Ondansetron  Airway Management Planned: LMA  Additional Equipment: None  Intra-op Plan:   Post-operative Plan: Extubation in OR  Informed Consent: I have reviewed the patients History and Physical, chart, labs and discussed the procedure including the risks, benefits and alternatives for the proposed anesthesia with the patient or authorized representative who has indicated his/her understanding and acceptance.     Dental advisory given  Plan Discussed with: CRNA  Anesthesia Plan Comments:       Anesthesia Quick Evaluation

## 2021-07-18 NOTE — Anesthesia Procedure Notes (Signed)
Procedure Name: LMA Insertion Date/Time: 07/18/2021 3:32 PM Performed by: Genelle Bal, CRNA Pre-anesthesia Checklist: Patient identified, Emergency Drugs available, Suction available and Patient being monitored Patient Re-evaluated:Patient Re-evaluated prior to induction Oxygen Delivery Method: Circle system utilized Preoxygenation: Pre-oxygenation with 100% oxygen Induction Type: IV induction Ventilation: Mask ventilation without difficulty LMA: LMA inserted LMA Size: 4.0 Number of attempts: 1 Airway Equipment and Method: Bite block Placement Confirmation: positive ETCO2 Tube secured with: Tape Dental Injury: Teeth and Oropharynx as per pre-operative assessment

## 2021-07-18 NOTE — Progress Notes (Signed)
Orthopedic Tech Progress Note Patient Details:  Rose Delgado 1961/06/05 [id number]  PACU RN called requesting a pair of CRUTCHES   Ortho Devices Type of Ortho Device: Crutches Ortho Device/Splint Interventions: Ordered, Application, Adjustment   Post Interventions Patient Tolerated: Well, Ambulated well Instructions Provided: Poper ambulation with device, Care of device  Janit Pagan 07/18/2021, 7:41 PM

## 2021-07-18 NOTE — Anesthesia Procedure Notes (Signed)
Anesthesia Regional Block: Adductor canal block   Pre-Anesthetic Checklist: , timeout performed,  Correct Patient, Correct Site, Correct Laterality,  Correct Procedure, Correct Position, site marked,  Risks and benefits discussed,  Surgical consent,  Pre-op evaluation,  At surgeon's request and post-op pain management  Laterality: Lower and Left  Prep: chloraprep       Needles:  Injection technique: Single-shot  Needle Type: Stimiplex     Needle Length: 9cm  Needle Gauge: 21     Additional Needles:   Procedures:,,,, ultrasound used (permanent image in chart),,    Narrative:  Start time: 07/18/2021 1:25 PM End time: 07/18/2021 1:45 PM Injection made incrementally with aspirations every 5 mL.  Performed by: Personally  Anesthesiologist: Nolon Nations, MD  Additional Notes: BP cuff, EKG monitors applied. Sedation begun. Artery and nerve location verified with ultrasound. Anesthetic injected incrementally (12ml), slowly, and after negative aspirations under direct u/s guidance. Good fascial/perineural spread. Tolerated well.

## 2021-07-18 NOTE — Transfer of Care (Signed)
Immediate Anesthesia Transfer of Care Note  Patient: Rose Delgado  Procedure(s) Performed: LEFT KNEE ANTERIOR CRUCIATE LIGAMENT RECONSTRUCTION, HAMSTRING AUTOGRAFT, MENISCAL DEBRIDEMENT (Left: Knee)  Patient Location: PACU  Anesthesia Type:GA combined with regional for post-op pain  Level of Consciousness: sedated  Airway & Oxygen Therapy: Patient Spontanous Breathing  Post-op Assessment: Report given to RN and Post -op Vital signs reviewed and stable  Post vital signs: Reviewed and stable  Last Vitals:  Vitals Value Taken Time  BP 112/66 07/18/21 1820  Temp 37.3 C 07/18/21 1820  Pulse 77 07/18/21 1823  Resp 14 07/18/21 1823  SpO2 94 % 07/18/21 1823  Vitals shown include unvalidated device data.  Last Pain:  Vitals:   07/18/21 1820  TempSrc:   PainSc: Asleep         Complications: No notable events documented.

## 2021-07-19 ENCOUNTER — Encounter: Payer: 59 | Admitting: Orthopedic Surgery

## 2021-07-20 ENCOUNTER — Telehealth: Payer: Self-pay

## 2021-07-20 ENCOUNTER — Telehealth: Payer: Self-pay | Admitting: Orthopedic Surgery

## 2021-07-20 NOTE — Telephone Encounter (Signed)
Pt had surgery on her left leg on Tuesday and she is stating that she had a nerve block and she is still numb from her thigh down to her ankle. She would like to know if this is normal ?   Please advise

## 2021-07-20 NOTE — Telephone Encounter (Signed)
Called and discussed with patient this evening. No concern.  Numbness improving currently. Had block

## 2021-07-20 NOTE — Telephone Encounter (Signed)
Received $25.00 check and medical records release form/ Forwarding to [employer] today 

## 2021-07-21 ENCOUNTER — Other Ambulatory Visit (HOSPITAL_COMMUNITY): Payer: Self-pay

## 2021-07-21 ENCOUNTER — Encounter (HOSPITAL_COMMUNITY): Payer: Self-pay | Admitting: Orthopedic Surgery

## 2021-07-21 MED FILL — Polyethylene Glycol 3350 Oral Powder 17 GM/SCOOP: ORAL | 15 days supply | Qty: 238 | Fill #1 | Status: AC

## 2021-07-26 ENCOUNTER — Ambulatory Visit (INDEPENDENT_AMBULATORY_CARE_PROVIDER_SITE_OTHER): Payer: 59 | Admitting: Surgical

## 2021-07-26 ENCOUNTER — Other Ambulatory Visit: Payer: Self-pay

## 2021-07-26 ENCOUNTER — Encounter: Payer: Self-pay | Admitting: Orthopedic Surgery

## 2021-07-26 DIAGNOSIS — S83512D Sprain of anterior cruciate ligament of left knee, subsequent encounter: Secondary | ICD-10-CM

## 2021-07-30 ENCOUNTER — Encounter: Payer: Self-pay | Admitting: Orthopedic Surgery

## 2021-07-30 NOTE — Progress Notes (Signed)
Post-Op Visit Note   Patient: Rose Delgado           Date of Birth: Dec 20, 1960           MRN: [id number] Visit Date: 07/26/2021 PCP: Caryl Bis, MD   Assessment & Plan:  Chief Complaint:  Chief Complaint  Patient presents with   Left Knee - Routine Post Op   Visit Diagnoses: No diagnosis found.  Plan: Patient is a 61 year old female who presents s/p left knee anterior cruciate ligament reconstruction with hamstring autograft and meniscal debridement on 07/18/2021.  She states that pain is improving as she gets out from surgery.  Only taking Celebrex for pain.  Taking aspirin for DVT prophylaxis.  She has discontinued oxycodone on as she did not want to take that too much and discontinued gabapentin as it causes dizziness.  In addition to the postop pain, she also notes numbness throughout her left leg that extends down from the abductor canal block site down to the anterior ankle.  She states that this feels like it is slowly getting better but still causing her some discomfort.  She is ambulating with crutches and a wheelchair.  Using CPM machine daily and it was up to 72 degrees before she backed off to 60 degrees due to pain.  On exam, she has incisions that are healing well without any evidence of infection or dehiscence.  Trace effusion noted.  Range of motion from 0 degrees extension to 50 degrees of knee flexion.  Dorsiflexion is intact on exam.  No calf tenderness.  Negative Homans' sign.  She is able to perform straight leg raise with a light amount of assistance.  ACL graft is stable on Lachman exam.  Sutures removed and replaced with Steri-Strips today.  Plan is return in 2 weeks for clinical recheck primarily on her range of motion and quad strength.  She can discontinue the knee immobilizer and crutches when she can perform 15 straight leg raises.  Recommended she ease out of the crutches over time and do do not discontinue all at once.  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  Discussed with the patient that several patients in the past have had similar long-lasting effects from the block and we will continue to monitor this in the future.  Dorsiflexion is intact on exam today.  Follow-Up Instructions: No follow-ups on file.   Orders:  No orders of the defined types were placed in this encounter.  No orders of the defined types were placed in this encounter.   Imaging: No results found.  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 02/16/2016   Nausea with vomiting 07/14/2014   Dysgeusia 03/11/2014   GERD (gastroesophageal reflux disease) 03/11/2014   Hot flashes 03/09/2014   Perimenopause 03/09/2014   Fibroids 03/09/2014   Past Medical History:  Diagnosis Date   Allergic rhinitis    Angio-edema    Fibroids    uterine   GERD (gastroesophageal reflux disease)  Hypertension    Irregular heart beat    Migraines    Perimenopause 03/09/2014   Postmenopausal 2016   Urticaria     Family History  Problem Relation Age of Onset   Other Mother        glaucoma; pacemaker   Hypertension Mother    Other Father        aneursym   Cancer Sister        biliary, deceased age 32   Hypertension Sister    Cancer Brother        lung   Hypertension Brother    Sleep apnea Brother    Hypertension Brother    Hypertension Brother    Hypertension Sister    Hypertension Sister    Hypertension Sister     Colon cancer Neg Hx    Allergic rhinitis Neg Hx    Asthma Neg Hx     Past Surgical History:  Procedure Laterality Date   ANTERIOR CRUCIATE LIGAMENT REPAIR Left 07/18/2021   Procedure: LEFT KNEE ANTERIOR CRUCIATE LIGAMENT RECONSTRUCTION, HAMSTRING AUTOGRAFT, MENISCAL DEBRIDEMENT;  Surgeon: Meredith Pel, MD;  Location: [facility];  Service: Orthopedics;  Laterality: Left;   BREAST BIOPSY Left 2008   benign   COLONOSCOPY  06/2011   Dr. Britta Mccreedy: per patient it was normal, follow up in 10 years.    ESOPHAGOGASTRODUODENOSCOPY     Dr. Berneta Sages: late 1990s/early 2000   ESOPHAGOGASTRODUODENOSCOPY N/A 05/19/2014   SLF:NO OBVIOUS SOURCE FOR DYSGEUSIA IDENTIFED/MILD Non-erosive gastritis   INCISION AND DRAINAGE Right 07/31/2017   Procedure: INCISION AND DRAINAGE RIGHT THUMB NAIL BED;  Surgeon: Charlotte Crumb, MD;  Location: [place];  Service: Orthopedics;  Laterality: Right;   INGUINAL HERNIA REPAIR     KNEE SURGERY Right    NAILBED REPAIR Right 07/31/2017   Procedure: NAILBED BIOPSY;  Surgeon: Charlotte Crumb, MD;  Location: [facility];  Service: Orthopedics;  Laterality: Right;   TONSILLECTOMY AND ADENOIDECTOMY     Social History   Occupational History   Occupation: Therapist, 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

## 2021-07-31 ENCOUNTER — Encounter: Payer: Self-pay | Admitting: Orthopedic Surgery

## 2021-08-02 ENCOUNTER — Other Ambulatory Visit: Payer: Self-pay

## 2021-08-02 ENCOUNTER — Encounter: Payer: Self-pay | Admitting: Orthopedic Surgery

## 2021-08-02 ENCOUNTER — Ambulatory Visit (INDEPENDENT_AMBULATORY_CARE_PROVIDER_SITE_OTHER): Payer: 59 | Admitting: Orthopedic Surgery

## 2021-08-02 DIAGNOSIS — S83512D Sprain of anterior cruciate ligament of left knee, subsequent encounter: Secondary | ICD-10-CM

## 2021-08-03 ENCOUNTER — Encounter: Payer: Self-pay | Admitting: Orthopedic Surgery

## 2021-08-03 NOTE — Progress Notes (Signed)
Office Visit Note   Patient: Rose Delgado           Date of Birth: 1960/12/16           MRN: [id number] Visit Date: 08/02/2021 Requested by: Caryl Bis, MD [place],  [place] [id number] PCP: Caryl Bis, MD  Subjective: Chief Complaint  Patient presents with   Left Knee - Routine Post Op    HPI: Patient presents now 2 weeks out left knee ACL reconstruction.  Hamstring autograft.  Starts physical therapy February 6.  She has been weightbearing as tolerated.  She has been having some numbness on the medial aspect of the leg beginning right around her abductor canal block and extending medially down.  To the foot.  On exam she has range of motion of 0 to 90 degrees of flexion.  Graft is stable.  Incisions intact.  Plan at this time is weightbearing as tolerated.  She can do straight leg raises.  2-week return for clinical recheck.  I think this numbness situation is something that should improve and appears to be related to the block as her paresthesias extend up to the medial mid thigh region.  Tourniquet was not utilized for the case.             ROS: See above  Assessment & Plan: Visit Diagnoses:  1. Rupture of anterior cruciate ligament of left knee, subsequent encounter     Plan: See above  Follow-Up Instructions: Return in about 2 weeks (around 08/16/2021).   Orders:  No orders of the defined types were placed in this encounter.  No orders of the defined types were placed in this encounter.     Procedures: No procedures performed   Clinical Data: No additional findings.  Objective: Vital Signs: LMP 07/29/2017   Physical Exam: See above  Ortho Exam: See above  Specialty Comments:  No specialty comments available.  Imaging: No results found.   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 02/16/2016   Nausea with vomiting 07/14/2014   Dysgeusia 03/11/2014   GERD (gastroesophageal reflux disease) 03/11/2014   Hot flashes 03/09/2014   Perimenopause 03/09/2014   Fibroids 03/09/2014   Past Medical History:  Diagnosis Date   Allergic rhinitis    Angio-edema    Fibroids    uterine   GERD (gastroesophageal reflux disease)    Hypertension    Irregular heart beat    Migraines    Perimenopause 03/09/2014   Postmenopausal 2016   Urticaria     Family History  Problem Relation Age of Onset   Other Mother        glaucoma; pacemaker   Hypertension Mother    Other Father        aneursym   Cancer Sister        biliary, deceased age 81   Hypertension Sister    Cancer Brother        lung   Hypertension Brother    Sleep apnea Brother  Hypertension Brother    Hypertension Brother    Hypertension Sister    Hypertension Sister    Hypertension Sister    Colon cancer Neg Hx    Allergic rhinitis Neg Hx    Asthma Neg Hx     Past Surgical History:  Procedure Laterality Date   ANTERIOR CRUCIATE LIGAMENT REPAIR Left 07/18/2021   Procedure: LEFT KNEE ANTERIOR CRUCIATE LIGAMENT RECONSTRUCTION, HAMSTRING AUTOGRAFT, MENISCAL DEBRIDEMENT;  Surgeon: Meredith Pel, MD;  Location: [facility];  Service: Orthopedics;  Laterality: Left;   BREAST BIOPSY Left 2008   benign   COLONOSCOPY  06/2011   Dr. Britta Mccreedy: per patient it was normal, follow up in 10 years.    ESOPHAGOGASTRODUODENOSCOPY     Dr. Berneta Sages: late 1990s/early 2000   ESOPHAGOGASTRODUODENOSCOPY N/A 05/19/2014   SLF:NO OBVIOUS SOURCE FOR DYSGEUSIA IDENTIFED/MILD Non-erosive  gastritis   INCISION AND DRAINAGE Right 07/31/2017   Procedure: INCISION AND DRAINAGE RIGHT THUMB NAIL BED;  Surgeon: Charlotte Crumb, MD;  Location: [facility];  Service: Orthopedics;  Laterality: Right;   INGUINAL HERNIA REPAIR     KNEE SURGERY Right    NAILBED REPAIR Right 07/31/2017   Procedure: NAILBED BIOPSY;  Surgeon: Charlotte Crumb, MD;  Location: [facility];  Service: Orthopedics;  Laterality: Right;   TONSILLECTOMY AND ADENOIDECTOMY     Social History   Occupational History   Occupation: Therapist, 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

## 2021-08-05 ENCOUNTER — Other Ambulatory Visit: Payer: Self-pay

## 2021-08-05 DIAGNOSIS — S83512A Sprain of anterior cruciate ligament of left knee, initial encounter: Secondary | ICD-10-CM

## 2021-08-05 DIAGNOSIS — S83242D Other tear of medial meniscus, current injury, left knee, subsequent encounter: Secondary | ICD-10-CM

## 2021-08-07 ENCOUNTER — Ambulatory Visit (HOSPITAL_COMMUNITY): Payer: 59 | Attending: Orthopedic Surgery | Admitting: Physical Therapy

## 2021-08-07 ENCOUNTER — Encounter (HOSPITAL_COMMUNITY): Payer: Self-pay | Admitting: Physical Therapy

## 2021-08-07 ENCOUNTER — Other Ambulatory Visit: Payer: Self-pay

## 2021-08-07 DIAGNOSIS — M6281 Muscle weakness (generalized): Secondary | ICD-10-CM | POA: Insufficient documentation

## 2021-08-07 DIAGNOSIS — M25562 Pain in left knee: Secondary | ICD-10-CM

## 2021-08-07 DIAGNOSIS — M25662 Stiffness of left knee, not elsewhere classified: Secondary | ICD-10-CM | POA: Diagnosis not present

## 2021-08-07 DIAGNOSIS — R2689 Other abnormalities of gait and mobility: Secondary | ICD-10-CM

## 2021-08-07 NOTE — Patient Instructions (Signed)
Access Code: [email or web address] URL: [email or web address] Date: 08/07/2021 Prepared by: Josue Hector  Exercises Supine Quad Set - 3 x daily - 7 x weekly - 2 sets - 10 reps - 5 second hold Active Straight Leg Raise with Quad Set - 3 x daily - 7 x weekly - 2 sets - 10 reps Supine Gluteal Sets - 3 x daily - 7 x weekly - 2 sets - 10 reps - 5 second hold Supine Ankle Pumps - 3 x daily - 7 x weekly - 2 sets - 10 reps Supine Heel Slide with Strap - 3 x daily - 7 x weekly - 1 sets - 10 reps - 5 second hold

## 2021-08-07 NOTE — Therapy (Signed)
[place], [place], [address] Phone: [phone]   Fax:  [phone]  Physical Therapy Evaluation  Patient Details  Name: Rose Delgado MRN: [id number] Date of Birth: [birth year] Referring Provider (PT): Gloriann Loan Pa-C   Encounter Date: 08/07/2021   PT End of Session - 08/07/21 1019     Visit Number 1    Number of Visits 20    Date for PT Re-Evaluation 10/16/21    Authorization Type [place] [employer]    Progress Note Due on Visit 10    PT Start Time 0945    PT Stop Time 1025    PT Time Calculation (min) 40 min    Activity Tolerance Patient tolerated treatment well    Behavior During Therapy [facility] for tasks assessed/performed             Past Medical History:  Diagnosis Date   Allergic rhinitis    Angio-edema    Fibroids    uterine   GERD (gastroesophageal reflux disease)    Hypertension    Irregular heart beat    Migraines    Perimenopause 03/09/2014   Postmenopausal 2016   Urticaria     Past Surgical History:  Procedure Laterality Date   ANTERIOR CRUCIATE LIGAMENT REPAIR Left 07/18/2021   Procedure: LEFT KNEE ANTERIOR CRUCIATE LIGAMENT RECONSTRUCTION, HAMSTRING AUTOGRAFT, MENISCAL DEBRIDEMENT;  Surgeon: Meredith Pel, MD;  Location: [facility];  Service: Orthopedics;  Laterality: Left;   BREAST BIOPSY Left 2008   benign   COLONOSCOPY  06/2011   Dr. Britta Mccreedy: per patient it was normal, follow up in 10 years.    ESOPHAGOGASTRODUODENOSCOPY     Dr. Berneta Sages: late 1990s/early 2000   ESOPHAGOGASTRODUODENOSCOPY N/A 05/19/2014   SLF:NO OBVIOUS SOURCE FOR DYSGEUSIA IDENTIFED/MILD Non-erosive gastritis   INCISION AND DRAINAGE Right 07/31/2017   Procedure: INCISION AND DRAINAGE RIGHT THUMB NAIL BED;  Surgeon: Charlotte Crumb, MD;  Location: [facility];  Service: Orthopedics;  Laterality: Right;   INGUINAL HERNIA REPAIR     KNEE SURGERY Right    NAILBED REPAIR Right 07/31/2017    Procedure: NAILBED BIOPSY;  Surgeon: Charlotte Crumb, MD;  Location: [facility];  Service: Orthopedics;  Laterality: Right;   TONSILLECTOMY AND ADENOIDECTOMY      There were no vitals filed for this visit.    Subjective Assessment - 08/07/21 0953     Subjective Patient presents to therapy with complaint of LT knee pain s/p LT ACL reconstruction with hamstring autograft on 07/18/21. She is currently ambulating with singe crutch. Notes elevated pain levels. She is managing this with Celebrex, and Flexeril. She has a CPM at home, she is also using heat and ice packs as needed. Still very stiff in knee and having trouble going up and down stairs.    Pertinent History LT ACL reconstruction with hamstring autograft 07/18/21    Limitations Lifting;Standing;Walking;House hold activities    Diagnostic tests MRI    Patient Stated Goals Be able to ambulate steps    Currently in Pain? Yes    Pain Score 8     Pain Location Knee    Pain Orientation Left    Pain Descriptors / Indicators Aching;Throbbing;Numbness    Pain Type Surgical pain    Pain Onset 1 to 4 weeks ago    Pain Frequency Constant    Aggravating Factors  WB, walking, standing    Pain Relieving Factors rest, meds, heat, ice    Effect  of Pain on Daily Activities Limits                [facility] PT Assessment - 08/07/21 0001       Assessment   Medical Diagnosis LT ACL reconstruction with hamstring autograft    Referring Provider (PT) Gloriann Loan Pa-C    Onset Date/Surgical Date 07/18/21    Prior Therapy Yes      Precautions   Precautions Knee   ACL protocol   Required Braces or Orthoses Other Brace/Splint   Has Don Joy brace but states she was told not to wear it     Restrictions   Weight Bearing Restrictions No   WBAT     Balance Screen   Has the patient fallen in the past 6 months No      [place] residence      Prior Function   Level of Independence Independent     Vocation Full time employment    Vocation Requirements ER Nurse      Cognition   Overall Cognitive Status Within Functional Limits for tasks assessed      Observation/Other Assessments   Observations Post op incisions are intact and appear to be well healing, mild edema noted diffuse about patella    Focus on Therapeutic Outcomes (FOTO)  21% function      Sensation   Light Touch --   Hypersensitive     AROM   Left Knee Extension -4    Left Knee Flexion 70      Strength   Overall Strength Comments knee MMT NT this date per surgical precaution      Ambulation/Gait   Ambulation/Gait Yes    Ambulation/Gait Assistance 6: Modified independent (Device/Increase time)    Assistive device R Axillary Crutch    Gait Pattern Decreased stance time - left;Decreased stride length;Decreased hip/knee flexion - left;Antalgic    Ambulation Surface Level;Indoor                        Objective measurements completed on examination: See above findings.       [facility] Adult PT Treatment/Exercise - 08/07/21 0001       Knee/Hip Exercises: Supine   Quad Sets Left;10 reps                     PT Education - 08/07/21 0956     Education Details on evaluation findings, POC and HEP    Person(s) Educated Patient    Methods Explanation;Handout    Comprehension Verbalized understanding              PT Short Term Goals - 08/07/21 1023       PT SHORT TERM GOAL #1   Title Patient will be independent with initial HEP and self-management strategies to improve functional outcomes    Time 4    Period Weeks    Status New    Target Date 09/04/21      PT SHORT TERM GOAL #2   Title Patient will have equal to or > 4/5 MMT throughout LLE to improve ability to perform functional mobility, stair ambulation and ADLs.    Time 4    Period Weeks    Status New    Target Date 09/04/21      PT SHORT TERM GOAL #3   Title Patient will have LT knee AROM 0-110 degrees to improve  functional mobility and facilitate squatting to  pick up items from floor.    Time 4    Period Weeks    Status New    Target Date 09/04/21               PT Long Term Goals - 08/07/21 1024       PT LONG TERM GOAL #1   Title Patient will improve FOTO score to predicted value to indicate improvement in functional outcomes    Time 10    Period Weeks    Status New    Target Date 10/16/21      PT LONG TERM GOAL #2   Title Patient will be independent with advance HEP and self-management strategies to improve functional outcomes    Time 10    Period Weeks    Status New    Target Date 10/09/21      PT LONG TERM GOAL #3   Title Patient will report at least 85% overall improvement in subjective complaint to indicate improvement in ability to perform ADLs.    Time 10    Period Weeks    Status New    Target Date 10/09/21      PT LONG TERM GOAL #4   Title Patient will have equal to or > 4+/5 MMT throughout BLE to improve ability to perform functional mobility, stair ambulation and ADLs.    Time 10    Period Weeks    Status New    Target Date 10/09/21      PT LONG TERM GOAL #5   Title Patient will have LT knee AROM 0-130 degrees to improve functional mobility and facilitate squatting to pick up items from floor.    Time 10    Period Weeks    Status New    Target Date 10/09/21                    Plan - 08/07/21 1019     Clinical Impression Statement Patient is a 61 y.o. female who presents to physical therapy with complaint of Lt knee pain s/p   LT ACL reconstruction 07/18/21. Patient demonstrates decreased strength, ROM restriction, and gait abnormalities which are likely contributing to symptoms of pain and are negatively impacting patient ability to perform ADLs and functional mobility tasks. Patient will benefit from skilled physical therapy services to address these deficits to reduce pain, improve level of function with ADLs and functional mobility tasks.     Examination-Activity Limitations Stand;Stairs;Squat;Transfers;Locomotion Level    Examination-Participation Restrictions Occupation;Yard Work;Community Activity;Cleaning    Stability/Clinical Decision Making Stable/Uncomplicated    Clinical Decision Making Low    Rehab Potential Good    PT Frequency 2x / week    PT Duration --   10 weeks   PT Treatment/Interventions ADLs/Self Care Home Management;Biofeedback;Cryotherapy;Electrical Stimulation;Iontophoresis 4mg /ml Dexamethasone;Moist Heat;Traction;Balance training;Manual techniques;Therapeutic exercise;Taping;Splinting;Vasopneumatic Device;Therapeutic activities;Functional mobility training;Orthotic Fit/Training;Stair training;Gait training;DME Instruction;Patient/family education;Passive range of motion;Dry needling;Energy conservation;Joint Manipulations;Spinal Manipulations;Contrast Bath;Fluidtherapy;Scar mobilization;Neuromuscular re-education;Parrafin;Ultrasound;Compression bandaging;Visual/perceptual remediation/compensation    PT Next Visit Plan Conitnue with ACL rehab per post op protocol (see copy in protocol book, or find on therapist desk)    PT Home Exercise Plan Eval: quad set, SLR, AAROM heel slide, glute set and ankle pump    Consulted and Agree with Plan of Care Patient             Patient will benefit from skilled therapeutic intervention in order to improve the following deficits and impairments:  Pain, Decreased strength, Decreased activity tolerance, Decreased balance, Decreased  mobility, Difficulty walking, Impaired flexibility, Decreased range of motion, Abnormal gait, Increased edema, Increased fascial restricitons, Improper body mechanics  Visit Diagnosis: Left knee pain, unspecified chronicity  Stiffness of left knee, not elsewhere classified  Other abnormalities of gait and mobility     Problem List Patient Active Problem List   Diagnosis Date Noted   Left anterior cruciate ligament tear    Acute medial  meniscal tear, left, subsequent encounter    Bilateral tennis elbow 06/01/2020   PMB (postmenopausal bleeding) 01/07/2019   Encounter for gynecological examination with Papanicolaou smear of cervix 01/07/2019   Screening for colorectal cancer 01/07/2019   Seasonal and perennial allergic rhinitis 08/27/2018   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   Pain in finger of right hand 06/27/2017   Menorrhagia 05/28/2016   Left hand pain 02/16/2016   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 [place], [place], [address] Phone: [phone]   Fax:  [phone]  Name: Rose Delgado MRN: [id number] Date of Birth: 1960/08/17

## 2021-08-09 ENCOUNTER — Encounter: Payer: 59 | Admitting: Orthopedic Surgery

## 2021-08-09 ENCOUNTER — Other Ambulatory Visit: Payer: Self-pay

## 2021-08-09 ENCOUNTER — Encounter (HOSPITAL_COMMUNITY): Payer: Self-pay | Admitting: Physical Therapy

## 2021-08-09 ENCOUNTER — Ambulatory Visit (HOSPITAL_COMMUNITY): Payer: 59 | Admitting: Physical Therapy

## 2021-08-09 DIAGNOSIS — R2689 Other abnormalities of gait and mobility: Secondary | ICD-10-CM

## 2021-08-09 DIAGNOSIS — M25562 Pain in left knee: Secondary | ICD-10-CM | POA: Diagnosis not present

## 2021-08-09 DIAGNOSIS — M6281 Muscle weakness (generalized): Secondary | ICD-10-CM | POA: Diagnosis not present

## 2021-08-09 DIAGNOSIS — M25662 Stiffness of left knee, not elsewhere classified: Secondary | ICD-10-CM

## 2021-08-09 NOTE — Therapy (Signed)
[place], [place], [address] Phone: [phone]   Fax:  [phone]  Physical Therapy Treatment  Patient Details  Name: Rose Delgado MRN: [id number] Date of Birth: September 03, 1960 Referring Provider (PT): Gloriann Loan Pa-C   Encounter Date: 08/09/2021   PT End of Session - 08/09/21 1420     Visit Number 2    Number of Visits 20    Date for PT Re-Evaluation 10/16/21    Authorization Type [place] [employer]    Progress Note Due on Visit 10    PT Start Time 1740    PT Stop Time 1440    PT Time Calculation (min) 47 min    Activity Tolerance Patient tolerated treatment well    Behavior During Therapy [facility] for tasks assessed/performed             Past Medical History:  Diagnosis Date   Allergic rhinitis    Angio-edema    Fibroids    uterine   GERD (gastroesophageal reflux disease)    Hypertension    Irregular heart beat    Migraines    Perimenopause 03/09/2014   Postmenopausal 2016   Urticaria     Past Surgical History:  Procedure Laterality Date   ANTERIOR CRUCIATE LIGAMENT REPAIR Left 07/18/2021   Procedure: LEFT KNEE ANTERIOR CRUCIATE LIGAMENT RECONSTRUCTION, HAMSTRING AUTOGRAFT, MENISCAL DEBRIDEMENT;  Surgeon: Meredith Pel, MD;  Location: [place];  Service: Orthopedics;  Laterality: Left;   BREAST BIOPSY Left 2008   benign   COLONOSCOPY  06/2011   Dr. Britta Mccreedy: per patient it was normal, follow up in 10 years.    ESOPHAGOGASTRODUODENOSCOPY     Dr. Berneta Sages: late 1990s/early 2000   ESOPHAGOGASTRODUODENOSCOPY N/A 05/19/2014   SLF:NO OBVIOUS SOURCE FOR DYSGEUSIA IDENTIFED/MILD Non-erosive gastritis   INCISION AND DRAINAGE Right 07/31/2017   Procedure: INCISION AND DRAINAGE RIGHT THUMB NAIL BED;  Surgeon: Charlotte Crumb, MD;  Location: [place];  Service: Orthopedics;  Laterality: Right;   INGUINAL HERNIA REPAIR     KNEE SURGERY Right    NAILBED REPAIR Right 07/31/2017    Procedure: NAILBED BIOPSY;  Surgeon: Charlotte Crumb, MD;  Location: [place];  Service: Orthopedics;  Laterality: Right;   TONSILLECTOMY AND ADENOIDECTOMY      There were no vitals filed for this visit.   Subjective Assessment - 08/09/21 1419     Subjective Patient reports elevated pain level. She is weaning off of pain meds currently. She is doing HEP 3 x daily with no issues.    Pertinent History LT ACL reconstruction with hamstring autograft 07/18/21    Limitations Lifting;Standing;Walking;House hold activities    Diagnostic tests MRI    Patient Stated Goals Be able to ambulate steps    Currently in Pain? Yes    Pain Score 8     Pain Location Knee    Pain Orientation Left    Pain Descriptors / Indicators Aching;Throbbing    Pain Type Surgical pain    Pain Onset 1 to 4 weeks ago                               [facility] Adult PT Treatment/Exercise - 08/09/21 0001       Knee/Hip Exercises: Supine   Quad Sets Left;10 reps    Heel Slides AAROM;10 reps    Straight Leg Raises Left;2 sets;10 reps    Knee Extension AROM  Knee Extension Limitations 0    Knee Flexion AROM    Knee Flexion Limitations 88    Other Supine Knee/Hip Exercises glute set 10 x 5"      Knee/Hip Exercises: Sidelying   Hip ABduction Left;1 set;10 reps      Manual Therapy   Manual Therapy Passive ROM    Manual therapy comments Completed separate from all other activity    Passive ROM LT knee flexion PROM to tolerance                       PT Short Term Goals - 08/07/21 1023       PT SHORT TERM GOAL #1   Title Patient will be independent with initial HEP and self-management strategies to improve functional outcomes    Time 4    Period Weeks    Status New    Target Date 09/04/21      PT SHORT TERM GOAL #2   Title Patient will have equal to or > 4/5 MMT throughout LLE to improve ability to perform functional mobility, stair ambulation and ADLs.     Time 4    Period Weeks    Status New    Target Date 09/04/21      PT SHORT TERM GOAL #3   Title Patient will have LT knee AROM 0-110 degrees to improve functional mobility and facilitate squatting to pick up items from floor.    Time 4    Period Weeks    Status New    Target Date 09/04/21               PT Long Term Goals - 08/07/21 1024       PT LONG TERM GOAL #1   Title Patient will improve FOTO score to predicted value to indicate improvement in functional outcomes    Time 10    Period Weeks    Status New    Target Date 10/16/21      PT LONG TERM GOAL #2   Title Patient will be independent with advance HEP and self-management strategies to improve functional outcomes    Time 10    Period Weeks    Status New    Target Date 10/09/21      PT LONG TERM GOAL #3   Title Patient will report at least 85% overall improvement in subjective complaint to indicate improvement in ability to perform ADLs.    Time 10    Period Weeks    Status New    Target Date 10/09/21      PT LONG TERM GOAL #4   Title Patient will have equal to or > 4+/5 MMT throughout BLE to improve ability to perform functional mobility, stair ambulation and ADLs.    Time 10    Period Weeks    Status New    Target Date 10/09/21      PT LONG TERM GOAL #5   Title Patient will have LT knee AROM 0-130 degrees to improve functional mobility and facilitate squatting to pick up items from floor.    Time 10    Period Weeks    Status New    Target Date 10/09/21                   Plan - 08/09/21 1535     Clinical Impression Statement Patient tolerated session well today. Patient comes with 2 knee braces. Setup up donjoy immobilizer knee brace and educated  patient on use and function for increased knee stability during ambulation. Progressed LE strengthening with sidelying hip abduction. Performed manual PROM to patient tolerance, patient able to acheive 88 degrees of knee flexion today. Educated  patient on gait and transfers to and from chair using donjoy brace. Patient will conitnue to beneift from skilled therapy services progress according to post op protocol for return to PLOF with ADLs.    Examination-Activity Limitations Stand;Stairs;Squat;Transfers;Locomotion Level    Examination-Participation Restrictions Occupation;Yard Work;Community Activity;Cleaning    Stability/Clinical Decision Making Stable/Uncomplicated    Rehab Potential Good    PT Frequency 2x / week    PT Duration --   10 weeks   PT Treatment/Interventions ADLs/Self Care Home Management;Biofeedback;Cryotherapy;Electrical Stimulation;Iontophoresis 4mg /ml Dexamethasone;Moist Heat;Traction;Balance training;Manual techniques;Therapeutic exercise;Taping;Splinting;Vasopneumatic Device;Therapeutic activities;Functional mobility training;Orthotic Fit/Training;Stair training;Gait training;DME Instruction;Patient/family education;Passive range of motion;Dry needling;Energy conservation;Joint Manipulations;Spinal Manipulations;Contrast Bath;Fluidtherapy;Scar mobilization;Neuromuscular re-education;Parrafin;Ultrasound;Compression bandaging;Visual/perceptual remediation/compensation    PT Next Visit Plan Conitnue with ACL rehab per post op protocol (see copy in protocol book, or find on therapist desk)    PT Home Exercise Plan Eval: quad set, SLR, AAROM heel slide, glute set and ankle pump 2/8 sidelying hip abduction    Consulted and Agree with Plan of Care Patient             Patient will benefit from skilled therapeutic intervention in order to improve the following deficits and impairments:  Pain, Decreased strength, Decreased activity tolerance, Decreased balance, Decreased mobility, Difficulty walking, Impaired flexibility, Decreased range of motion, Abnormal gait, Increased edema, Increased fascial restricitons, Improper body mechanics  Visit Diagnosis: Left knee pain, unspecified chronicity  Stiffness of left knee, not  elsewhere classified  Other abnormalities of gait and mobility     Problem List 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 [place], [place], [address] Phone: [phone]   Fax:  [phone]  Name: RMANI KAPUSTA MRN: [id number] Date of Birth: [birth year]

## 2021-08-09 NOTE — Patient Instructions (Signed)
Access Code: [email or web address] URL: [email or web address] Date: 08/09/2021 Prepared by: Josue Hector  Exercises Sidelying Hip Abduction - 3 x daily - 7 x weekly - 2 sets - 10 reps

## 2021-08-10 ENCOUNTER — Other Ambulatory Visit (HOSPITAL_COMMUNITY): Payer: Self-pay

## 2021-08-10 ENCOUNTER — Telehealth: Payer: Self-pay | Admitting: *Deleted

## 2021-08-10 NOTE — Telephone Encounter (Signed)
As per last clinic visit the numbness is likely coming from the block.  Nothing really to do about that.  The knee pain is expected.  Needs to move the knee is much as possible.  Pain and swelling not uncommon at this time.  Normal for it to swell when she is up and about.

## 2021-08-10 NOTE — Telephone Encounter (Signed)
I talked with patient and discussed. Dr Marlou Sa also talked with patient again.

## 2021-08-10 NOTE — Telephone Encounter (Signed)
Pt is still having trouble with her knee, has pain, swelling, and numbness. Says the knee goes down when she is laying down, but when up it swells. No redness, no fever or tender to touch. Would like a CB to what can do.   [phone]

## 2021-08-12 ENCOUNTER — Other Ambulatory Visit (HOSPITAL_COMMUNITY): Payer: Self-pay

## 2021-08-12 ENCOUNTER — Other Ambulatory Visit: Payer: Self-pay | Admitting: Cardiology

## 2021-08-14 ENCOUNTER — Other Ambulatory Visit: Payer: Self-pay | Admitting: Surgical

## 2021-08-14 ENCOUNTER — Other Ambulatory Visit (HOSPITAL_COMMUNITY): Payer: Self-pay

## 2021-08-14 MED ORDER — DILTIAZEM HCL ER COATED BEADS 300 MG PO CP24
ORAL_CAPSULE | Freq: Every day | ORAL | 3 refills | Status: DC
  Filled 2021-08-14: qty 90, 90d supply, fill #0
  Filled 2021-10-14: qty 90, 90d supply, fill #1

## 2021-08-15 ENCOUNTER — Encounter (HOSPITAL_COMMUNITY): Payer: Self-pay

## 2021-08-15 ENCOUNTER — Other Ambulatory Visit: Payer: Self-pay

## 2021-08-15 ENCOUNTER — Ambulatory Visit (HOSPITAL_COMMUNITY): Payer: 59

## 2021-08-15 DIAGNOSIS — M25662 Stiffness of left knee, not elsewhere classified: Secondary | ICD-10-CM

## 2021-08-15 DIAGNOSIS — M6281 Muscle weakness (generalized): Secondary | ICD-10-CM | POA: Diagnosis not present

## 2021-08-15 DIAGNOSIS — M25562 Pain in left knee: Secondary | ICD-10-CM

## 2021-08-15 DIAGNOSIS — R2689 Other abnormalities of gait and mobility: Secondary | ICD-10-CM | POA: Diagnosis not present

## 2021-08-15 NOTE — Patient Instructions (Signed)
Access Code: [email or web address] URL: [email or web address] Date: 08/15/2021 Prepared by: Niemah Schwebke Hartnett-Rands  Exercises Supine Gluteus Stretch - 1 x daily - 7 x weekly - 1 sets - 3 reps - 30 hold Sidelying TFL Stretch - 1 x daily - 7 x weekly - 1 sets - 3 reps - 30 hold  Patient Education Scar Massage

## 2021-08-15 NOTE — Therapy (Signed)
[place], [place], [address] Phone: [phone]   Fax:  [phone]  Physical Therapy Treatment  Patient Details  Name: Rose Delgado MRN: [id number] Date of Birth: February 03, 1961 Referring Provider (PT): Meredith Pel, MD   Encounter Date: 08/15/2021   PT End of Session - 08/15/21 1240     Visit Number 3    Number of Visits 20    Date for PT Re-Evaluation 10/16/21    Authorization Type [place] [employer]    Progress Note Due on Visit 10    PT Start Time 3875    PT Stop Time 1132    PT Time Calculation (min) 41 min    Activity Tolerance Patient tolerated treatment well    Behavior During Therapy [facility] for tasks assessed/performed             Past Medical History:  Diagnosis Date   Allergic rhinitis    Angio-edema    Fibroids    uterine   GERD (gastroesophageal reflux disease)    Hypertension    Irregular heart beat    Migraines    Perimenopause 03/09/2014   Postmenopausal 2016   Urticaria     Past Surgical History:  Procedure Laterality Date   ANTERIOR CRUCIATE LIGAMENT REPAIR Left 07/18/2021   Procedure: LEFT KNEE ANTERIOR CRUCIATE LIGAMENT RECONSTRUCTION, HAMSTRING AUTOGRAFT, MENISCAL DEBRIDEMENT;  Surgeon: Meredith Pel, MD;  Location: [place];  Service: Orthopedics;  Laterality: Left;   BREAST BIOPSY Left 2008   benign   COLONOSCOPY  06/2011   Dr. Britta Mccreedy: per patient it was normal, follow up in 10 years.    ESOPHAGOGASTRODUODENOSCOPY     Dr. Berneta Sages: late 1990s/early 2000   ESOPHAGOGASTRODUODENOSCOPY N/A 05/19/2014   SLF:NO OBVIOUS SOURCE FOR DYSGEUSIA IDENTIFED/MILD Non-erosive gastritis   INCISION AND DRAINAGE Right 07/31/2017   Procedure: INCISION AND DRAINAGE RIGHT THUMB NAIL BED;  Surgeon: Charlotte Crumb, MD;  Location: [place];  Service: Orthopedics;  Laterality: Right;   INGUINAL HERNIA REPAIR     KNEE SURGERY Right    NAILBED REPAIR Right 07/31/2017    Procedure: NAILBED BIOPSY;  Surgeon: Charlotte Crumb, MD;  Location: [place];  Service: Orthopedics;  Laterality: Right;   TONSILLECTOMY AND ADENOIDECTOMY      There were no vitals filed for this visit.   Subjective Assessment - 08/15/21 1103     Subjective CPM returned Friday. Frustrated knee continues to be painful, swollen and stiff in the mornings.Still not able to feel much of her knee and not sure why. MD appointment tomorrow. Unclear whether she should be wearing the knee brace or not.    Pertinent History LT ACL reconstruction with hamstring autograft 07/18/21    Limitations Lifting;Standing;Walking;House hold activities    Diagnostic tests MRI    Patient Stated Goals Be able to ambulate steps    Currently in Pain? Yes    Pain Score 8     Pain Location Knee    Pain Orientation Posterior;Left;Lateral    Pain Onset 1 to 4 weeks ago                [facility] PT Assessment - 08/15/21 0001       Assessment   Medical Diagnosis LT ACL reconstruction with hamstring autograft    Referring Provider (PT) Meredith Pel, MD    Onset Date/Surgical Date 07/18/21    Prior Therapy Yes      Precautions   Precautions  Knee   ACL protocol   Required Braces or Orthoses Other Brace/Splint    Other Brace/Splint Has Don Joy brace but states she was told not to wear it      Restrictions   Weight Bearing Restrictions Yes    LLE Weight Bearing Weight bearing as tolerated      Observation/Other Assessments   Observations Post op incisions are intact and appear to be well healing, mild edema noted diffuse about patella and posterior knee      Strength   Overall Strength Comments knee MMT NT this date per surgical precaution      Palpation   Patella mobility good    Palpation comment tender to palpation over posterior L knee, distal iliotibial band and greater trochanter      Ambulation/Gait   Ambulation/Gait Yes    Ambulation/Gait Assistance 6: Modified  independent (Device/Increase time)    Assistive device R Axillary Crutch    Gait Pattern Decreased stance time - left;Decreased stride length;Decreased hip/knee flexion - left;Antalgic   left leg abducted with right lateral trunk lean   Ambulation Surface Level;Indoor    Gait Comments raised R axillary crutch 1 level with improved gait mechanics                  [facility] Adult PT Treatment/Exercise - 08/15/21 0001       Knee/Hip Exercises: Stretches   ITB Stretch Left;3 reps;30 seconds    Other Knee/Hip Stretches knee to opposite shoulder 30 X3      Knee/Hip Exercises: Supine   Quad Sets Left;10 reps;2 sets    Heel Slides 10 reps;AROM    Straight Leg Raises Left;2 sets;10 reps    Knee Extension AROM    Knee Extension Limitations 0    Knee Flexion AROM    Knee Flexion Limitations 120      Knee/Hip Exercises: Sidelying   Hip ABduction Left;1 set;10 reps      Manual Therapy   Manual Therapy Soft tissue mobilization;Myofascial release    Manual therapy comments Completed separate from all other activity    Soft tissue mobilization left gluteals, L distal ITB, posterior knee    Myofascial Release scar mobilization                     PT Education - 08/15/21 1240     Education Details Discussed purpose and technique of interventions throughout session. Advanced HEP.    Person(s) Educated Patient    Methods Explanation;Handout    Comprehension Verbalized understanding              PT Short Term Goals - 08/15/21 1246       PT SHORT TERM GOAL #1   Title Patient will be independent with initial HEP and self-management strategies to improve functional outcomes    Time 4    Period Weeks    Status On-going    Target Date 09/04/21      PT SHORT TERM GOAL #2   Title Patient will have equal to or > 4/5 MMT throughout LLE to improve ability to perform functional mobility, stair ambulation and ADLs.    Time 4    Period Weeks    Status On-going    Target  Date 09/04/21      PT SHORT TERM GOAL #3   Title Patient will have LT knee AROM 0-110 degrees to improve functional mobility and facilitate squatting to pick up items from floor.    Baseline 2/14 -  0-120    Time 4    Period Weeks    Status Achieved    Target Date 09/04/21               PT Long Term Goals - 08/15/21 1246       PT LONG TERM GOAL #1   Title Patient will improve FOTO score to predicted value to indicate improvement in functional outcomes    Time 10    Period Weeks    Status On-going    Target Date 10/16/21      PT LONG TERM GOAL #2   Title Patient will be independent with advance HEP and self-management strategies to improve functional outcomes    Time 10    Period Weeks    Status On-going    Target Date 10/09/21      PT LONG TERM GOAL #3   Title Patient will report at least 85% overall improvement in subjective complaint to indicate improvement in ability to perform ADLs.    Time 10    Period Weeks    Status On-going    Target Date 10/09/21      PT LONG TERM GOAL #4   Title Patient will have equal to or > 4+/5 MMT throughout BLE to improve ability to perform functional mobility, stair ambulation and ADLs.    Time 10    Period Weeks    Status On-going    Target Date 10/09/21      PT LONG TERM GOAL #5   Title Patient will have LT knee AROM 0-130 degrees to improve functional mobility and facilitate squatting to pick up items from floor.    Time 10    Period Weeks    Status On-going    Target Date 10/09/21                   Plan - 08/15/21 1241     Clinical Impression Statement Session focused on left lower extremity strengthening and VMO activation and AROM. Patient's left knee flexion was 120 after supine heel slide performance. Manual therapy to incisions, and to left posterior knee, distal ITB and left gluteals for pain reduction and improve mobility. Added sidelying TFL/ITB and glut stretches to therapeutic exercises and HEP.  Educated patient on scar massage. At end of session, raised right axiillary crutch one level with improved gait mechanics. Patient making good progress during therapy sessions thus far. Patient has been performing her HEP. Patient would continue to benefit from skilled physical therapy to reduce impairment and improve function.    Examination-Activity Limitations Stand;Stairs;Squat;Transfers;Locomotion Level    Examination-Participation Restrictions Occupation;Yard Work;Community Activity;Cleaning    Stability/Clinical Decision Making Stable/Uncomplicated    Rehab Potential Good    PT Frequency 2x / week    PT Duration --   10 weeks   PT Treatment/Interventions ADLs/Self Care Home Management;Biofeedback;Cryotherapy;Electrical Stimulation;Iontophoresis 4mg /ml Dexamethasone;Moist Heat;Traction;Balance training;Manual techniques;Therapeutic exercise;Taping;Splinting;Vasopneumatic Device;Therapeutic activities;Functional mobility training;Orthotic Fit/Training;Stair training;Gait training;DME Instruction;Patient/family education;Passive range of motion;Dry needling;Energy conservation;Joint Manipulations;Spinal Manipulations;Contrast Bath;Fluidtherapy;Scar mobilization;Neuromuscular re-education;Parrafin;Ultrasound;Compression bandaging;Visual/perceptual remediation/compensation    PT Next Visit Plan Conitnue with ACL rehab per post op protocol (see copy in protocol book, or find on therapist desk)    PT Home Exercise Plan Eval: quad set, SLR, AAROM heel slide, glute set and ankle pump 2/8 sidelying hip abduction; 2/14 - glut and ITB/TFL stretches, scar massage    Consulted and Agree with Plan of Care Patient             Patient will  benefit from skilled therapeutic intervention in order to improve the following deficits and impairments:  Pain, Decreased strength, Decreased activity tolerance, Decreased balance, Decreased mobility, Difficulty walking, Impaired flexibility, Decreased range of motion,  Abnormal gait, Increased edema, Increased fascial restricitons, Improper body mechanics  Visit Diagnosis: Left knee pain, unspecified chronicity  Stiffness of left knee, not elsewhere classified  Other abnormalities of gait and mobility  Muscle weakness (generalized)     Problem List 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 [place], [place], [address] Phone: [phone]   Fax:  [phone]  Name: Rose Delgado MRN: [id number] Date of Birth: [birth year]

## 2021-08-16 ENCOUNTER — Ambulatory Visit (HOSPITAL_COMMUNITY): Payer: 59 | Admitting: Physical Therapy

## 2021-08-16 ENCOUNTER — Encounter: Payer: Self-pay | Admitting: Orthopedic Surgery

## 2021-08-16 ENCOUNTER — Ambulatory Visit (INDEPENDENT_AMBULATORY_CARE_PROVIDER_SITE_OTHER): Payer: 59 | Admitting: Surgical

## 2021-08-16 DIAGNOSIS — S83512D Sprain of anterior cruciate ligament of left knee, subsequent encounter: Secondary | ICD-10-CM

## 2021-08-16 NOTE — Progress Notes (Signed)
Post-Op Visit Note   Patient: Rose Delgado           Date of Birth: 27-Oct-1960           MRN: [id number] Visit Date: 08/16/2021 PCP: Caryl Bis, MD   Assessment & Plan:  Chief Complaint:  Chief Complaint  Patient presents with   Left Knee - Routine Post Op    07/18/21 (4w 1d) Left Knee Anterior Cruciate Ligament Reconstruction, Hamstring Autograft, Meniscal Debridement      Visit Diagnoses:  1. Rupture of anterior cruciate ligament of left knee, subsequent encounter     Plan: Patient is a 61 year old female who presents s/p left knee anterior cruciate ligament reconstruction with hamstring autograft and meniscal debridement on 07/18/2021.  She is in physical therapy 2 times per week in [place].  She most recently went yesterday where she was measured at 108 degrees of flexion and then again at 117 degrees of flexion.  She is ambulating with a single crutch.  Not having to take any medications for pain and can just "deal with the pain".  Swelling continues to improve.  She has some continued numbness left over from the block and this seems to be improving as well slowly but surely.  On exam she has 3 degrees extension and 115 degrees of knee flexion.  Small effusion still present.  Incisions are healing well without evidence of infection or dehiscence.  She is able to perform straight leg raise multiple times without extensor lag.  ACL graft is stable on Lachman exam and by anterior drawer.  No calf tenderness.  Negative Homans' sign.  Plan is to continue with physical therapy and follow-up on 09/08/2021 for determination of return to work.  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  Follow-Up Instructions: No follow-ups on file.   Orders:  No orders of the defined types were placed in this encounter.  No orders of the  defined types were placed in this encounter.   Imaging: No results found.  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   Nausea with vomiting 07/14/2014   Dysgeusia 03/11/2014   GERD (gastroesophageal reflux disease) 03/11/2014   Hot flashes 03/09/2014   Perimenopause 03/09/2014   Fibroids 03/09/2014   Past Medical History:  Diagnosis Date   Allergic rhinitis    Angio-edema    Fibroids    uterine   GERD (gastroesophageal reflux disease)    Hypertension    Irregular heart beat    Migraines    Perimenopause 03/09/2014   Postmenopausal 2016   Urticaria     Family History  Problem Relation Age of Onset   Other Mother        glaucoma; pacemaker   Hypertension Mother  Other Father        aneursym   Cancer Sister        biliary, deceased age 30   Hypertension Sister    Cancer Brother        lung   Hypertension Brother    Sleep apnea Brother    Hypertension Brother    Hypertension Brother    Hypertension Sister    Hypertension Sister    Hypertension Sister    Colon cancer Neg Hx    Allergic rhinitis Neg Hx    Asthma Neg Hx     Past Surgical History:  Procedure Laterality Date   ANTERIOR CRUCIATE LIGAMENT REPAIR Left 07/18/2021   Procedure: LEFT KNEE ANTERIOR CRUCIATE LIGAMENT RECONSTRUCTION, HAMSTRING AUTOGRAFT, MENISCAL DEBRIDEMENT;  Surgeon: Meredith Pel,  MD;  Location: [facility];  Service: Orthopedics;  Laterality: Left;   BREAST BIOPSY Left 2008   benign   COLONOSCOPY  06/2011   Dr. Britta Mccreedy: per patient it was normal, follow up in 10 years.    ESOPHAGOGASTRODUODENOSCOPY     Dr. Berneta Sages: late 1990s/early 2000   ESOPHAGOGASTRODUODENOSCOPY N/A 05/19/2014   SLF:NO OBVIOUS SOURCE FOR DYSGEUSIA IDENTIFED/MILD Non-erosive gastritis   INCISION AND DRAINAGE Right 07/31/2017   Procedure: INCISION AND DRAINAGE RIGHT THUMB NAIL BED;  Surgeon: Charlotte Crumb, MD;  Location: [facility];  Service: Orthopedics;  Laterality: Right;   INGUINAL HERNIA REPAIR     KNEE SURGERY Right    NAILBED REPAIR Right 07/31/2017   Procedure: NAILBED BIOPSY;  Surgeon: Charlotte Crumb, MD;  Location: [facility];  Service: Orthopedics;  Laterality: Right;   TONSILLECTOMY AND ADENOIDECTOMY     Social History   Occupational History   Occupation: Therapist, sports at [employer]: [place]  Tobacco Use   Smoking status: Never   Smokeless tobacco: Never  Vaping Use   Vaping Use: Never used  Substance and Sexual Activity   Alcohol use: No   Drug use: No   Sexual activity: Yes    Birth control/protection: Post-menopausal, None

## 2021-08-17 ENCOUNTER — Other Ambulatory Visit: Payer: Self-pay

## 2021-08-17 ENCOUNTER — Ambulatory Visit (HOSPITAL_COMMUNITY): Payer: 59 | Admitting: Physical Therapy

## 2021-08-17 DIAGNOSIS — M25562 Pain in left knee: Secondary | ICD-10-CM

## 2021-08-17 DIAGNOSIS — R2689 Other abnormalities of gait and mobility: Secondary | ICD-10-CM | POA: Diagnosis not present

## 2021-08-17 DIAGNOSIS — M6281 Muscle weakness (generalized): Secondary | ICD-10-CM | POA: Diagnosis not present

## 2021-08-17 DIAGNOSIS — M25662 Stiffness of left knee, not elsewhere classified: Secondary | ICD-10-CM

## 2021-08-17 NOTE — Therapy (Signed)
[facility] [place], [place], [address] Phone: [phone]   Fax:  [phone]  Physical Therapy Treatment  Patient Details  Name: Rose Delgado MRN: [id number] Date of Birth: 1961-01-15 Referring Provider (PT): Meredith Pel, MD   Encounter Date: 08/17/2021   PT End of Session - 08/17/21 1113     Visit Number 4    Number of Visits 20    Date for PT Re-Evaluation 10/16/21    Authorization Type [place] [employer]    Progress Note Due on Visit 10    PT Start Time 1005    PT Stop Time 1050    PT Time Calculation (min) 45 min    Activity Tolerance Patient tolerated treatment well    Behavior During Therapy [facility] for tasks assessed/performed             Past Medical History:  Diagnosis Date   Allergic rhinitis    Angio-edema    Fibroids    uterine   GERD (gastroesophageal reflux disease)    Hypertension    Irregular heart beat    Migraines    Perimenopause 03/09/2014   Postmenopausal 2016   Urticaria     Past Surgical History:  Procedure Laterality Date   ANTERIOR CRUCIATE LIGAMENT REPAIR Left 07/18/2021   Procedure: LEFT KNEE ANTERIOR CRUCIATE LIGAMENT RECONSTRUCTION, HAMSTRING AUTOGRAFT, MENISCAL DEBRIDEMENT;  Surgeon: Meredith Pel, MD;  Location: [facility];  Service: Orthopedics;  Laterality: Left;   BREAST BIOPSY Left 2008   benign   COLONOSCOPY  06/2011   Dr. Britta Mccreedy: per patient it was normal, follow up in 10 years.    ESOPHAGOGASTRODUODENOSCOPY     Dr. Berneta Sages: late 1990s/early 2000   ESOPHAGOGASTRODUODENOSCOPY N/A 05/19/2014   SLF:NO OBVIOUS SOURCE FOR DYSGEUSIA IDENTIFED/MILD Non-erosive gastritis   INCISION AND DRAINAGE Right 07/31/2017   Procedure: INCISION AND DRAINAGE RIGHT THUMB NAIL BED;  Surgeon: Charlotte Crumb, MD;  Location: [facility];  Service: Orthopedics;  Laterality: Right;   INGUINAL HERNIA REPAIR     KNEE SURGERY Right    NAILBED REPAIR Right 07/31/2017    Procedure: NAILBED BIOPSY;  Surgeon: Charlotte Crumb, MD;  Location: [facility];  Service: Orthopedics;  Laterality: Right;   TONSILLECTOMY AND ADENOIDECTOMY      There were no vitals filed for this visit.   Subjective Assessment - 08/17/21 1033     Subjective Pt comes today without AD or brace stating MD has released her from these and is overall pleased with progress.  Pt reports the sensitivity of her knee is the worst and she's still having edema.  Walked in with 4/10 pain    Currently in Pain? Yes    Pain Score 4     Pain Location Knee    Pain Orientation Left    Pain Descriptors / Indicators Tightness;Heaviness    Pain Type Surgical pain                               [facility] Adult PT Treatment/Exercise - 08/17/21 0001       Ambulation/Gait   Ambulation/Gait Yes    Ambulation/Gait Assistance 6: Modified independent (Device/Increase time)    Ambulation Distance (Feet) 125 Feet    Assistive device None    Gait Pattern Decreased stance time - left;Decreased stride length;Decreased hip/knee flexion - left;Antalgic    Ambulation Surface Level;Indoor      Knee/Hip  Exercises: Stretches   Knee: Self-Stretch to increase Flexion Left;10 seconds    Knee: Self-Stretch Limitations 10 reps onto 12" step    Gastroc Stretch Both;3 reps;20 seconds    Gastroc Stretch Limitations slant board      Knee/Hip Exercises: Standing   Heel Raises 15 reps    Forward Step Up Left;10 reps;Hand Hold: 1;Step Height: 4"    Wall Squat 10 reps;5 seconds    SLS Lt 30" X 2 without UE assist      Knee/Hip Exercises: Supine   Bridges 10 reps    Straight Leg Raises Left;2 sets;10 reps    Knee Extension AROM    Knee Extension Limitations 2    Knee Flexion AROM      Knee/Hip Exercises: Sidelying   Hip ABduction 10 reps      Knee/Hip Exercises: Prone   Hamstring Curl 10 reps    Hip Extension Left;10 reps      Manual Therapy   Manual Therapy Soft tissue  mobilization;Myofascial release;Other (comment)    Manual therapy comments Completed separate from all other activity    Soft tissue mobilization to perimeter to mobilize edema    Myofascial Release scar mobilization    Other Manual Therapy desensitization techniques                       PT Short Term Goals - 08/15/21 1246       PT SHORT TERM GOAL #1   Title Patient will be independent with initial HEP and self-management strategies to improve functional outcomes    Time 4    Period Weeks    Status On-going    Target Date 09/04/21      PT SHORT TERM GOAL #2   Title Patient will have equal to or > 4/5 MMT throughout LLE to improve ability to perform functional mobility, stair ambulation and ADLs.    Time 4    Period Weeks    Status On-going    Target Date 09/04/21      PT SHORT TERM GOAL #3   Title Patient will have LT knee AROM 0-110 degrees to improve functional mobility and facilitate squatting to pick up items from floor.    Baseline 2/14 - 0-120    Time 4    Period Weeks    Status Achieved    Target Date 09/04/21               PT Long Term Goals - 08/15/21 1246       PT LONG TERM GOAL #1   Title Patient will improve FOTO score to predicted value to indicate improvement in functional outcomes    Time 10    Period Weeks    Status On-going    Target Date 10/16/21      PT LONG TERM GOAL #2   Title Patient will be independent with advance HEP and self-management strategies to improve functional outcomes    Time 10    Period Weeks    Status On-going    Target Date 10/09/21      PT LONG TERM GOAL #3   Title Patient will report at least 85% overall improvement in subjective complaint to indicate improvement in ability to perform ADLs.    Time 10    Period Weeks    Status On-going    Target Date 10/09/21      PT LONG TERM GOAL #4   Title Patient will have equal to or >  4+/5 MMT throughout BLE to improve ability to perform functional mobility,  stair ambulation and ADLs.    Time 10    Period Weeks    Status On-going    Target Date 10/09/21      PT LONG TERM GOAL #5   Title Patient will have LT knee AROM 0-130 degrees to improve functional mobility and facilitate squatting to pick up items from floor.    Time 10    Period Weeks    Status On-going    Target Date 10/09/21                   Plan - 08/17/21 1113     Clinical Impression Statement Pt currently in week 4 post op with full ROM and ability to ambulate without AD.  Remaining deficits include edema, antalgic gait, weakness, numbness and sensitivity.  Worked on gait using heel to toe increasing knee flexion with swing phase.  Began single leg balance as well as forward step ups per protocol. Pt able to maintain full 30 seconds without UE assist however with challenge.  No pain or issues with forward step up with ability to keep knee in neutral. Pt reports she does have steps in her home she would like to return to negotiating.  Began bridging and prone hamstring/glute strengthening without resistance in prone with noted weakness but no pain.  Instructed with desensitization techniques and encouraged to complete self-scar massage as well.  Therapist able to reduce restriction in knee following manual with audible scar release obtained in lateral and inferior incision locations.    Examination-Activity Limitations Stand;Stairs;Squat;Transfers;Locomotion Level    Examination-Participation Restrictions Occupation;Yard Work;Community Activity;Cleaning    Stability/Clinical Decision Making Stable/Uncomplicated    Rehab Potential Good    PT Frequency 2x / week    PT Duration --   10 weeks   PT Treatment/Interventions ADLs/Self Care Home Management;Biofeedback;Cryotherapy;Electrical Stimulation;Iontophoresis 4mg /ml Dexamethasone;Moist Heat;Traction;Balance training;Manual techniques;Therapeutic exercise;Taping;Splinting;Vasopneumatic Device;Therapeutic activities;Functional  mobility training;Orthotic Fit/Training;Stair training;Gait training;DME Instruction;Patient/family education;Passive range of motion;Dry needling;Energy conservation;Joint Manipulations;Spinal Manipulations;Contrast Bath;Fluidtherapy;Scar mobilization;Neuromuscular re-education;Parrafin;Ultrasound;Compression bandaging;Visual/perceptual remediation/compensation    PT Next Visit Plan Conitnue with ACL rehab per post op protocol (see copy in protocol book, or find on therapist desk).No resisted HS strengthening for 6 weeks post op (2/28) per protocol due to hamstring autograft.    PT Home Exercise Plan Eval: quad set, SLR, AAROM heel slide, glute set and ankle pump 2/8 sidelying hip abduction; 2/14 - glut and ITB/TFL stretches, scar massage    Consulted and Agree with Plan of Care Patient             Patient will benefit from skilled therapeutic intervention in order to improve the following deficits and impairments:  Pain, Decreased strength, Decreased activity tolerance, Decreased balance, Decreased mobility, Difficulty walking, Impaired flexibility, Decreased range of motion, Abnormal gait, Increased edema, Increased fascial restricitons, Improper body mechanics  Visit Diagnosis: No diagnosis found.     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 [place], [place], [address] Phone: [phone]   Fax:  [phone]  Name: Rose Delgado MRN: [id number] Date of Birth: May 19, 1961

## 2021-08-21 DIAGNOSIS — I10 Essential (primary) hypertension: Secondary | ICD-10-CM | POA: Diagnosis not present

## 2021-08-21 DIAGNOSIS — E785 Hyperlipidemia, unspecified: Secondary | ICD-10-CM | POA: Diagnosis not present

## 2021-08-21 DIAGNOSIS — K5900 Constipation, unspecified: Secondary | ICD-10-CM | POA: Diagnosis not present

## 2021-08-21 DIAGNOSIS — D127 Benign neoplasm of rectosigmoid junction: Secondary | ICD-10-CM | POA: Diagnosis not present

## 2021-08-21 DIAGNOSIS — Z1211 Encounter for screening for malignant neoplasm of colon: Secondary | ICD-10-CM | POA: Diagnosis not present

## 2021-08-21 DIAGNOSIS — K635 Polyp of colon: Secondary | ICD-10-CM | POA: Diagnosis not present

## 2021-08-22 ENCOUNTER — Encounter (HOSPITAL_COMMUNITY): Payer: Self-pay

## 2021-08-22 ENCOUNTER — Ambulatory Visit (HOSPITAL_COMMUNITY): Payer: 59

## 2021-08-22 ENCOUNTER — Other Ambulatory Visit: Payer: Self-pay

## 2021-08-22 DIAGNOSIS — M25562 Pain in left knee: Secondary | ICD-10-CM

## 2021-08-22 DIAGNOSIS — M6281 Muscle weakness (generalized): Secondary | ICD-10-CM

## 2021-08-22 DIAGNOSIS — M25662 Stiffness of left knee, not elsewhere classified: Secondary | ICD-10-CM | POA: Diagnosis not present

## 2021-08-22 DIAGNOSIS — R2689 Other abnormalities of gait and mobility: Secondary | ICD-10-CM | POA: Diagnosis not present

## 2021-08-22 NOTE — Therapy (Signed)
[place], [place], [address] Phone: [phone]   Fax:  [phone]  Physical Therapy Treatment  Patient Details  Name: Rose Delgado MRN: [id number] Date of Birth: 12-Sep-1960 Referring Provider (PT): Meredith Pel, MD   Encounter Date: 08/22/2021   PT End of Session - 08/22/21 1028     Visit Number 5    Number of Visits 20    Date for PT Re-Evaluation 10/16/21    Authorization Type [place] [employer]    Progress Note Due on Visit 10    PT Start Time 1030    PT Stop Time 1114    PT Time Calculation (min) 44 min    Activity Tolerance Patient tolerated treatment well    Behavior During Therapy [facility] for tasks assessed/performed             Past Medical History:  Diagnosis Date   Allergic rhinitis    Angio-edema    Fibroids    uterine   GERD (gastroesophageal reflux disease)    Hypertension    Irregular heart beat    Migraines    Perimenopause 03/09/2014   Postmenopausal 2016   Urticaria     Past Surgical History:  Procedure Laterality Date   ANTERIOR CRUCIATE LIGAMENT REPAIR Left 07/18/2021   Procedure: LEFT KNEE ANTERIOR CRUCIATE LIGAMENT RECONSTRUCTION, HAMSTRING AUTOGRAFT, MENISCAL DEBRIDEMENT;  Surgeon: Meredith Pel, MD;  Location: [facility];  Service: Orthopedics;  Laterality: Left;   BREAST BIOPSY Left 2008   benign   COLONOSCOPY  06/2011   Dr. Britta Mccreedy: per patient it was normal, follow up in 10 years.    ESOPHAGOGASTRODUODENOSCOPY     Dr. Berneta Sages: late 1990s/early 2000   ESOPHAGOGASTRODUODENOSCOPY N/A 05/19/2014   SLF:NO OBVIOUS SOURCE FOR DYSGEUSIA IDENTIFED/MILD Non-erosive gastritis   INCISION AND DRAINAGE Right 07/31/2017   Procedure: INCISION AND DRAINAGE RIGHT THUMB NAIL BED;  Surgeon: Charlotte Crumb, MD;  Location: [facility];  Service: Orthopedics;  Laterality: Right;   INGUINAL HERNIA REPAIR     KNEE SURGERY Right    NAILBED REPAIR Right 07/31/2017    Procedure: NAILBED BIOPSY;  Surgeon: Charlotte Crumb, MD;  Location: [facility];  Service: Orthopedics;  Laterality: Right;   TONSILLECTOMY AND ADENOIDECTOMY      There were no vitals filed for this visit.   Subjective Assessment - 08/22/21 1032     Subjective Pt arrives for today's treatment session reporting 4/10 left knee pain.  Pt states she is no longer usuing AD at this time.  Pt reports that she had a colonoscopy yesterday and it drowsy today.    Currently in Pain? Yes    Pain Score 4     Pain Location Knee    Pain Orientation Left                               [facility] Adult PT Treatment/Exercise - 08/22/21 0001       Knee/Hip Exercises: Stretches   Knee: Self-Stretch to increase Flexion Left;10 seconds    Knee: Self-Stretch Limitations 10 reps onto 12" step    Gastroc Stretch Both;3 reps;20 seconds    Gastroc Stretch Limitations slant board      Knee/Hip Exercises: Aerobic   Nustep Lvl 3 x 10 mins      Knee/Hip Exercises: Standing   Heel Raises 15 reps    Hip Abduction Both;15 reps  Hip Extension Both;15 reps    Forward Step Up Left;10 reps;Hand Hold: 1;Step Height: 4"      Knee/Hip Exercises: Seated   Long Arc Quad --    Long Arc [employer] --    [employer] 15 reps    Clamshell with [employer]   15 reps   Marching --    Marching Weights --    Hamstring Curl --    Hamstring Limitations --      Knee/Hip Exercises: Supine   Quad Sets Left;10 reps;2 sets    Heel Slides AROM;15 reps    Bridges 15 reps    Straight Leg Raises Left;2 sets;10 reps      Manual Therapy   Manual Therapy Soft tissue mobilization    Soft tissue mobilization medial and lateral left knee for pain and edema                       PT Short Term Goals - 08/15/21 1246       PT SHORT TERM GOAL #1   Title Patient will be independent with initial HEP and self-management strategies to improve functional outcomes    Time 4    Period  Weeks    Status On-going    Target Date 09/04/21      PT SHORT TERM GOAL #2   Title Patient will have equal to or > 4/5 MMT throughout LLE to improve ability to perform functional mobility, stair ambulation and ADLs.    Time 4    Period Weeks    Status On-going    Target Date 09/04/21      PT SHORT TERM GOAL #3   Title Patient will have LT knee AROM 0-110 degrees to improve functional mobility and facilitate squatting to pick up items from floor.    Baseline 2/14 - 0-120    Time 4    Period Weeks    Status Achieved    Target Date 09/04/21               PT Long Term Goals - 08/15/21 1246       PT LONG TERM GOAL #1   Title Patient will improve FOTO score to predicted value to indicate improvement in functional outcomes    Time 10    Period Weeks    Status On-going    Target Date 10/16/21      PT LONG TERM GOAL #2   Title Patient will be independent with advance HEP and self-management strategies to improve functional outcomes    Time 10    Period Weeks    Status On-going    Target Date 10/09/21      PT LONG TERM GOAL #3   Title Patient will report at least 85% overall improvement in subjective complaint to indicate improvement in ability to perform ADLs.    Time 10    Period Weeks    Status On-going    Target Date 10/09/21      PT LONG TERM GOAL #4   Title Patient will have equal to or > 4+/5 MMT throughout BLE to improve ability to perform functional mobility, stair ambulation and ADLs.    Time 10    Period Weeks    Status On-going    Target Date 10/09/21      PT LONG TERM GOAL #5   Title Patient will have LT knee AROM 0-130 degrees to improve functional mobility and facilitate squatting to pick up items  from floor.    Time 10    Period Weeks    Status On-going    Target Date 10/09/21                   Plan - 08/22/21 1029     Clinical Impression Statement Pt continues to report sensitivity and pain to medial left knee.  Pt states that she  has been performing desensitization techniques at home, but has not been able to tell a difference at this point.  No pain with any exercisees today.  STW/M performed to medial and lateral left knee to decrease pain and edema.    Examination-Activity Limitations Stand;Stairs;Squat;Transfers;Locomotion Level    Examination-Participation Restrictions Occupation;Yard Work;Community Activity;Cleaning    Stability/Clinical Decision Making Stable/Uncomplicated    Rehab Potential Good    PT Frequency 2x / week    PT Duration --   10 weeks   PT Treatment/Interventions ADLs/Self Care Home Management;Biofeedback;Cryotherapy;Electrical Stimulation;Iontophoresis 4mg /ml Dexamethasone;Moist Heat;Traction;Balance training;Manual techniques;Therapeutic exercise;Taping;Splinting;Vasopneumatic Device;Therapeutic activities;Functional mobility training;Orthotic Fit/Training;Stair training;Gait training;DME Instruction;Patient/family education;Passive range of motion;Dry needling;Energy conservation;Joint Manipulations;Spinal Manipulations;Contrast Bath;Fluidtherapy;Scar mobilization;Neuromuscular re-education;Parrafin;Ultrasound;Compression bandaging;Visual/perceptual remediation/compensation    PT Next Visit Plan Conitnue with ACL rehab per post op protocol (see copy in protocol book, or find on therapist desk).No resisted HS strengthening for 6 weeks post op (2/28) per protocol due to hamstring autograft.    PT Home Exercise Plan Eval: quad set, SLR, AAROM heel slide, glute set and ankle pump 2/8 sidelying hip abduction; 2/14 - glut and ITB/TFL stretches, scar massage    Consulted and Agree with Plan of Care Patient             Patient will benefit from skilled therapeutic intervention in order to improve the following deficits and impairments:  Pain, Decreased strength, Decreased activity tolerance, Decreased balance, Decreased mobility, Difficulty walking, Impaired flexibility, Decreased range of motion, Abnormal  gait, Increased edema, Increased fascial restricitons, Improper body mechanics  Visit Diagnosis: Left knee pain, unspecified chronicity  Other abnormalities of gait and mobility  Stiffness of left knee, not elsewhere classified  Muscle weakness (generalized)     Problem List 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, [place], [address] Phone: [phone]   Fax:  [phone]  Name: Rose Delgado MRN: [id number] Date of Birth: [birth year]

## 2021-08-24 ENCOUNTER — Encounter (HOSPITAL_COMMUNITY): Payer: 59 | Admitting: Physical Therapy

## 2021-08-28 ENCOUNTER — Telehealth: Payer: Self-pay | Admitting: Surgical

## 2021-08-28 NOTE — Telephone Encounter (Signed)
Please call patient. She is having some issues and wants to see if she can be worked in earlier than her next scheduled post op appointment.

## 2021-08-29 ENCOUNTER — Encounter (HOSPITAL_COMMUNITY): Payer: 59

## 2021-08-29 NOTE — Telephone Encounter (Signed)
IC appt scheduled.  

## 2021-08-31 ENCOUNTER — Encounter (HOSPITAL_COMMUNITY): Payer: 59 | Admitting: Physical Therapy

## 2021-09-03 ENCOUNTER — Other Ambulatory Visit (HOSPITAL_COMMUNITY): Payer: Self-pay

## 2021-09-04 ENCOUNTER — Other Ambulatory Visit (HOSPITAL_COMMUNITY): Payer: Self-pay

## 2021-09-04 ENCOUNTER — Telehealth: Payer: Self-pay | Admitting: Orthopedic Surgery

## 2021-09-04 NOTE — Telephone Encounter (Signed)
[place] forms received. Holding for pts 3/8 appt to determine RTW date ?

## 2021-09-05 ENCOUNTER — Other Ambulatory Visit: Payer: Self-pay

## 2021-09-05 ENCOUNTER — Ambulatory Visit (HOSPITAL_COMMUNITY): Payer: 59 | Attending: Orthopedic Surgery | Admitting: Physical Therapy

## 2021-09-05 ENCOUNTER — Encounter (HOSPITAL_COMMUNITY): Payer: Self-pay | Admitting: Physical Therapy

## 2021-09-05 DIAGNOSIS — R2689 Other abnormalities of gait and mobility: Secondary | ICD-10-CM | POA: Insufficient documentation

## 2021-09-05 DIAGNOSIS — M25662 Stiffness of left knee, not elsewhere classified: Secondary | ICD-10-CM | POA: Insufficient documentation

## 2021-09-05 DIAGNOSIS — M6281 Muscle weakness (generalized): Secondary | ICD-10-CM | POA: Insufficient documentation

## 2021-09-05 DIAGNOSIS — M25562 Pain in left knee: Secondary | ICD-10-CM | POA: Insufficient documentation

## 2021-09-05 NOTE — Therapy (Signed)
[place], [place], [address] Phone: [phone]   Fax:  [phone]  Physical Therapy Treatment  Patient Details  Name: Rose Delgado MRN: [id number] Date of Birth: 1961/06/24 Referring Provider (PT): Meredith Pel, MD   Encounter Date: 09/05/2021   PT End of Session - 09/05/21 0859     Visit Number 6    Number of Visits 20    Date for PT Re-Evaluation 10/16/21    Authorization Type [place] [employer]    Progress Note Due on Visit 10    PT Start Time 0900    PT Stop Time [phone]    PT Time Calculation (min) 42 min    Activity Tolerance Patient tolerated treatment well    Behavior During Therapy [facility] for tasks assessed/performed             Past Medical History:  Diagnosis Date   Allergic rhinitis    Angio-edema    Fibroids    uterine   GERD (gastroesophageal reflux disease)    Hypertension    Irregular heart beat    Migraines    Perimenopause 03/09/2014   Postmenopausal 2016   Urticaria     Past Surgical History:  Procedure Laterality Date   ANTERIOR CRUCIATE LIGAMENT REPAIR Left 07/18/2021   Procedure: LEFT KNEE ANTERIOR CRUCIATE LIGAMENT RECONSTRUCTION, HAMSTRING AUTOGRAFT, MENISCAL DEBRIDEMENT;  Surgeon: Meredith Pel, MD;  Location: [place];  Service: Orthopedics;  Laterality: Left;   BREAST BIOPSY Left 2008   benign   COLONOSCOPY  06/2011   Dr. Britta Mccreedy: per patient it was normal, follow up in 10 years.    ESOPHAGOGASTRODUODENOSCOPY     Dr. Berneta Sages: late 1990s/early 2000   ESOPHAGOGASTRODUODENOSCOPY N/A 05/19/2014   SLF:NO OBVIOUS SOURCE FOR DYSGEUSIA IDENTIFED/MILD Non-erosive gastritis   INCISION AND DRAINAGE Right 07/31/2017   Procedure: INCISION AND DRAINAGE RIGHT THUMB NAIL BED;  Surgeon: Charlotte Crumb, MD;  Location: [place];  Service: Orthopedics;  Laterality: Right;   INGUINAL HERNIA REPAIR     KNEE SURGERY Right    NAILBED REPAIR Right 07/31/2017    Procedure: NAILBED BIOPSY;  Surgeon: Charlotte Crumb, MD;  Location: [place];  Service: Orthopedics;  Laterality: Right;   TONSILLECTOMY AND ADENOIDECTOMY      There were no vitals filed for this visit.   Subjective Assessment - 09/05/21 0906     Subjective Still having some pain and numbness. This has improved. She is walking fine with no AD.    Pertinent History LT ACL reconstruction with hamstring autograft 07/18/21    Currently in Pain? Yes    Pain Score 2     Pain Location Knee    Pain Orientation Left    Pain Descriptors / Indicators Sore;Numbness    Pain Type Surgical pain                               [facility] Adult PT Treatment/Exercise - 09/05/21 0001       Knee/Hip Exercises: Stretches   Gastroc Stretch Both;3 reps;30 seconds    Gastroc Stretch Limitations slant board      Knee/Hip Exercises: Machines for Strengthening   Other Machine TKE 3 plates x20      Knee/Hip Exercises: Standing   Heel Raises 20 reps    Hip Abduction Both;2 sets;10 reps    Abduction Limitations RTB    Hip Extension Both;2 sets;10  reps    Extension Limitations RTB    Forward Step Up Left;2 sets;10 reps;Hand Hold: 1;Step Height: 4"    Step Down Left;2 sets;10 reps;Hand Hold: 1;Step Height: 4"    Functional Squat 2 sets;10 reps    Functional Squat Limitations partial range to tolerance    SLS 3 x 20" on black foam pad      Knee/Hip Exercises: Supine   Quad Sets Left;10 reps    Heel Slides AROM;15 reps    Bridges 15 reps    Straight Leg Raises Left;2 sets;10 reps    Knee Extension AROM;Left    Knee Extension Limitations 1    Knee Flexion AROM;Left    Knee Flexion Limitations 128                       PT Short Term Goals - 08/15/21 1246       PT SHORT TERM GOAL #1   Title Patient will be independent with initial HEP and self-management strategies to improve functional outcomes    Time 4    Period Weeks    Status On-going     Target Date 09/04/21      PT SHORT TERM GOAL #2   Title Patient will have equal to or > 4/5 MMT throughout LLE to improve ability to perform functional mobility, stair ambulation and ADLs.    Time 4    Period Weeks    Status On-going    Target Date 09/04/21      PT SHORT TERM GOAL #3   Title Patient will have LT knee AROM 0-110 degrees to improve functional mobility and facilitate squatting to pick up items from floor.    Baseline 2/14 - 0-120    Time 4    Period Weeks    Status Achieved    Target Date 09/04/21               PT Long Term Goals - 08/15/21 1246       PT LONG TERM GOAL #1   Title Patient will improve FOTO score to predicted value to indicate improvement in functional outcomes    Time 10    Period Weeks    Status On-going    Target Date 10/16/21      PT LONG TERM GOAL #2   Title Patient will be independent with advance HEP and self-management strategies to improve functional outcomes    Time 10    Period Weeks    Status On-going    Target Date 10/09/21      PT LONG TERM GOAL #3   Title Patient will report at least 85% overall improvement in subjective complaint to indicate improvement in ability to perform ADLs.    Time 10    Period Weeks    Status On-going    Target Date 10/09/21      PT LONG TERM GOAL #4   Title Patient will have equal to or > 4+/5 MMT throughout BLE to improve ability to perform functional mobility, stair ambulation and ADLs.    Time 10    Period Weeks    Status On-going    Target Date 10/09/21      PT LONG TERM GOAL #5   Title Patient will have LT knee AROM 0-130 degrees to improve functional mobility and facilitate squatting to pick up items from floor.    Time 10    Period Weeks    Status On-going    Target  Date 10/09/21                   Plan - 09/05/21 0940     Clinical Impression Statement Patient progressing well per protocol. Currently 7 weeks out post op. AROM is good. Gait has improved significantly.  Static balance is good, patient able to tolerate challenged with balance on compliant surface. Patient remains limited by mild weakness in glutes and LT quad which continues to negatively impact functional ability. Patient will continue to benefit from skilled therapy services to address remaining deficits for return to PLOF.    Examination-Activity Limitations Stand;Stairs;Squat;Transfers;Locomotion Level    Examination-Participation Restrictions Occupation;Yard Work;Community Activity;Cleaning    Stability/Clinical Decision Making Stable/Uncomplicated    Rehab Potential Good    PT Frequency 2x / week    PT Duration --   10 weeks   PT Treatment/Interventions ADLs/Self Care Home Management;Biofeedback;Cryotherapy;Electrical Stimulation;Iontophoresis '4mg'$ /ml Dexamethasone;Moist Heat;Traction;Balance training;Manual techniques;Therapeutic exercise;Taping;Splinting;Vasopneumatic Device;Therapeutic activities;Functional mobility training;Orthotic Fit/Training;Stair training;Gait training;DME Instruction;Patient/family education;Passive range of motion;Dry needling;Energy conservation;Joint Manipulations;Spinal Manipulations;Contrast Bath;Fluidtherapy;Scar mobilization;Neuromuscular re-education;Parrafin;Ultrasound;Compression bandaging;Visual/perceptual remediation/compensation    PT Next Visit Plan Conitnue with ACL rehab per post op protocol (see copy in protocol book, or find on therapist desk). Initiate HS strengthening next visit    PT Home Exercise Plan Eval: quad set, SLR, AAROM heel slide, glute set and ankle pump 2/8 sidelying hip abduction; 2/14 - glut and ITB/TFL stretches, scar massage 3/7 heel raise, mini squat, banded hip abduction/ extension    Consulted and Agree with Plan of Care Patient             Patient will benefit from skilled therapeutic intervention in order to improve the following deficits and impairments:  Pain, Decreased strength, Decreased activity tolerance, Decreased  balance, Decreased mobility, Difficulty walking, Impaired flexibility, Decreased range of motion, Abnormal gait, Increased edema, Increased fascial restricitons, Improper body mechanics  Visit Diagnosis: Left knee pain, unspecified chronicity  Other abnormalities of gait and mobility  Stiffness of left knee, not elsewhere classified  Muscle weakness (generalized)     Problem List 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 [place], [place], [address] Phone: [phone]   Fax:  [phone]  Name: Rose Delgado MRN: [id number] Date of Birth: 1960/08/29

## 2021-09-05 NOTE — Patient Instructions (Signed)
Access Code: [facility] ?URL: [email or web address] ?Date: 09/05/2021 ?Prepared by: Josue Hector ? ?Exercises ?Standing Heel Raise with Support - 2 x daily - 7 x weekly - 2 sets - 10 reps ?Mini Squat with Counter Support - 2 x daily - 7 x weekly - 2 sets - 10 reps ?Hip Abduction with Resistance Loop - 2 x daily - 7 x weekly - 2 sets - 10 reps ?Hip Extension with Resistance Loop - 2 x daily - 7 x weekly - 2 sets - 10 reps ? ?

## 2021-09-06 ENCOUNTER — Ambulatory Visit (HOSPITAL_COMMUNITY): Payer: 59 | Admitting: Physical Therapy

## 2021-09-06 ENCOUNTER — Ambulatory Visit: Payer: 59 | Admitting: Orthopedic Surgery

## 2021-09-06 ENCOUNTER — Encounter (HOSPITAL_COMMUNITY): Payer: Self-pay | Admitting: Physical Therapy

## 2021-09-06 ENCOUNTER — Encounter: Payer: Self-pay | Admitting: Orthopedic Surgery

## 2021-09-06 DIAGNOSIS — M25562 Pain in left knee: Secondary | ICD-10-CM | POA: Diagnosis not present

## 2021-09-06 DIAGNOSIS — M6281 Muscle weakness (generalized): Secondary | ICD-10-CM

## 2021-09-06 DIAGNOSIS — M25662 Stiffness of left knee, not elsewhere classified: Secondary | ICD-10-CM

## 2021-09-06 DIAGNOSIS — R2689 Other abnormalities of gait and mobility: Secondary | ICD-10-CM | POA: Diagnosis not present

## 2021-09-06 DIAGNOSIS — S83512D Sprain of anterior cruciate ligament of left knee, subsequent encounter: Secondary | ICD-10-CM

## 2021-09-06 NOTE — Progress Notes (Signed)
? ?Post-Op Visit Note ?  ?Patient: Rose Delgado           ?Date of Birth: March 12, 1961           ?MRN: [id number] ?Visit Date: 09/06/2021 ?PCP: Caryl Bis, MD ? ? ?Assessment & Plan: ? ?Chief Complaint:  ?Chief Complaint  ?Patient presents with  ? Left Knee - Routine Post Op  ?  07/18/21 left knee ACL reconstruction, meniscal debridement, hamstring autograft  ? ?Visit Diagnoses:  ?1. Rupture of anterior cruciate ligament of left knee, subsequent encounter   ? ? ?Plan: Patient presents now about 2 months out left knee ACL reconstruction with meniscal debridement.  She is doing well.  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  She has been going to physical therapy.  The physical therapy note is reviewed today.  She is having a little bit of trochanteric pain.  I think it would be beneficial for therapy to work on some iliotibial band stretching as well as her strengthening for the knee.  On exam she has excellent graft stability trace effusion nearly full range of motion and improving quad and hamstring strength.  No calf tenderness.  At this time I think it is okay for her to return to work on Monday for an 8-hour shift 5 days a week but less than or equal to 4 hours of standing per day for 4 weeks and then regular duty.  Her numbness in the leg has improved from the block.  Follow-up with [place] in 8 w ?Follow-Up Instructions: Return in about 8 weeks (around 11/01/2021).  ? ?Orders:  ?No orders of the defined types were placed in this encounter. ? ?No orders of the defined types were placed in this encounter. ? ? ?Imaging: ?No results found. ? ?PMFS History: ?Patient Active Problem List  ? Diagnosis Date Noted  ? Left anterior cruciate ligament tear   ? Acute medial meniscal tear, left, subsequent encounter   ? Bilateral tennis elbow 06/01/2020  ? PMB (postmenopausal bleeding) 01/07/2019  ? Encounter for gynecological examination with Papanicolaou smear of cervix 01/07/2019  ? Screening for colorectal  cancer 01/07/2019  ? Seasonal and perennial allergic rhinitis 08/27/2018  ? 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  ? Pain in finger of right hand 06/27/2017  ? Menorrhagia 05/28/2016  ? Left hand pain 02/16/2016  ? 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 Nausea with vomiting 07/14/2014  ? Dysgeusia 03/11/2014  ? GERD (gastroesophageal reflux disease) 03/11/2014  ? Hot flashes 03/09/2014  ? Perimenopause 03/09/2014  ? Fibroids 03/09/2014  ? ?Past Medical History:  ?Diagnosis Date  ? Allergic rhinitis   ? Angio-edema   ? Fibroids   ? uterine  ? GERD (gastroesophageal reflux disease)   ? Hypertension   ? Irregular heart beat   ? Migraines   ? Perimenopause 03/09/2014  ? Postmenopausal 2016  ? Urticaria   ?  ?Family History  ?Problem Relation Age of Onset  ? Other Mother   ?     glaucoma; pacemaker  ? Hypertension Mother   ? Other Father   ?     aneursym  ? Cancer Sister   ?     biliary, deceased age 53  ? Hypertension Sister   ? Cancer Brother   ?     lung  ? Hypertension Brother   ? Sleep apnea Brother   ? Hypertension Brother   ? Hypertension Brother   ?  Hypertension Sister   ? Hypertension Sister   ? Hypertension Sister   ? Colon cancer Neg Hx   ? Allergic rhinitis Neg Hx   ? Asthma Neg Hx   ?  ?Past Surgical History:  ?Procedure Laterality Date  ? ANTERIOR CRUCIATE LIGAMENT REPAIR Left 07/18/2021  ? Procedure: LEFT KNEE ANTERIOR CRUCIATE LIGAMENT RECONSTRUCTION, HAMSTRING AUTOGRAFT, MENISCAL DEBRIDEMENT;  Surgeon: Meredith Pel, MD;  Location: [place];  Service: Orthopedics;  Laterality: Left;  ? BREAST BIOPSY Left 2008  ? benign  ? COLONOSCOPY  06/2011  ? Dr. Britta Mccreedy: per patient it was normal, follow up in 10 years.   ? ESOPHAGOGASTRODUODENOSCOPY    ? Dr. Berneta Sages: late 1990s/early 2000  ? ESOPHAGOGASTRODUODENOSCOPY N/A 05/19/2014  ? SLF:NO OBVIOUS SOURCE FOR DYSGEUSIA IDENTIFED/MILD Non-erosive gastritis  ? INCISION AND DRAINAGE Right 07/31/2017  ? Procedure:  INCISION AND DRAINAGE RIGHT THUMB NAIL BED;  Surgeon: Charlotte Crumb, MD;  Location: [place];  Service: Orthopedics;  Laterality: Right;  ? INGUINAL HERNIA REPAIR    ? KNEE SURGERY Right   ? NAILBED REPAIR Right 07/31/2017  ? Procedure: NAILBED BIOPSY;  Surgeon: Charlotte Crumb, MD;  Location: [place];  Service: Orthopedics;  Laterality: Right;  ? TONSILLECTOMY AND ADENOIDECTOMY    ? ?Social History  ? ?Occupational History  ? Occupation: Therapist, sports at [employer]  ?  Employer: [employer]  ?Tobacco Use  ? Smoking status: Never  ? Smokeless tobacco: Never  ?Vaping Use  ? Vaping Use: Never used  ?Substance and Sexual Activity  ? Alcohol use: No  ? Drug use: No  ? Sexual activity: Yes  ?  Birth control/protection: Post-menopausal, None  ? ? ? ?

## 2021-09-06 NOTE — Therapy (Signed)
[facility] [address] [place], [place], [address] Phone: [phone]   Fax:  [phone]  Physical Therapy Treatment  Patient Details  Name: Rose Delgado MRN: [id number] Date of Birth: 1961-02-23 Referring Provider (PT): Meredith Pel, MD  Progress Note Reporting Period 08/07/21 to 09/06/21  See note below for Objective Data and Assessment of Progress/Goals.     Encounter Date: 09/06/2021   PT End of Session - 09/06/21 0826     Visit Number 7    Number of Visits 20    Date for PT Re-Evaluation 10/16/21    Authorization Type [place] [employer]    Progress Note Due on Visit 17    PT Start Time 0820    PT Stop Time 0900    PT Time Calculation (min) 40 min    Activity Tolerance Patient tolerated treatment well    Behavior During Therapy [facility] for tasks assessed/performed             Past Medical History:  Diagnosis Date   Allergic rhinitis    Angio-edema    Fibroids    uterine   GERD (gastroesophageal reflux disease)    Hypertension    Irregular heart beat    Migraines    Perimenopause 03/09/2014   Postmenopausal 2016   Urticaria     Past Surgical History:  Procedure Laterality Date   ANTERIOR CRUCIATE LIGAMENT REPAIR Left 07/18/2021   Procedure: LEFT KNEE ANTERIOR CRUCIATE LIGAMENT RECONSTRUCTION, HAMSTRING AUTOGRAFT, MENISCAL DEBRIDEMENT;  Surgeon: Meredith Pel, MD;  Location: [place];  Service: Orthopedics;  Laterality: Left;   BREAST BIOPSY Left 2008   benign   COLONOSCOPY  06/2011   Dr. Britta Mccreedy: per patient it was normal, follow up in 10 years.    ESOPHAGOGASTRODUODENOSCOPY     Dr. Berneta Sages: late 1990s/early 2000   ESOPHAGOGASTRODUODENOSCOPY N/A 05/19/2014   SLF:NO OBVIOUS SOURCE FOR DYSGEUSIA IDENTIFED/MILD Non-erosive gastritis   INCISION AND DRAINAGE Right 07/31/2017   Procedure: INCISION AND DRAINAGE RIGHT THUMB NAIL BED;  Surgeon: Charlotte Crumb, MD;  Location: [place];   Service: Orthopedics;  Laterality: Right;   INGUINAL HERNIA REPAIR     KNEE SURGERY Right    NAILBED REPAIR Right 07/31/2017   Procedure: NAILBED BIOPSY;  Surgeon: Charlotte Crumb, MD;  Location: [place];  Service: Orthopedics;  Laterality: Right;   TONSILLECTOMY AND ADENOIDECTOMY      There were no vitals filed for this visit.   Subjective Assessment - 09/06/21 0824     Subjective Its still tight. Reports 85% improvement overall.    Pertinent History LT ACL reconstruction with hamstring autograft 07/18/21    Limitations Lifting;Standing;Walking;House hold activities    Patient Stated Goals Be able to ambulate steps    Currently in Pain? Yes    Pain Score 2     Pain Location Knee    Pain Orientation Left    Pain Descriptors / Indicators Tightness;Numbness    Pain Type Surgical pain    Pain Onset More than a month ago    Pain Frequency Intermittent    Aggravating Factors  WB, walking, standing    Pain Relieving Factors rest, meds, heat, ice    Effect of Pain on Daily Activities Limits                [facility] PT Assessment - 09/06/21 0001       Assessment   Medical Diagnosis LT ACL reconstruction with hamstring autograft  Referring Provider (PT) Meredith Pel, MD    Onset Date/Surgical Date 07/18/21    Prior Therapy Yes      Precautions   Precautions Knee   protocol     Restrictions   Weight Bearing Restrictions No      Balance Screen   Has the patient fallen in the past 6 months Yes    How many times? 1    Has the patient had a decrease in activity level because of a fear of falling?  No    Is the patient reluctant to leave their home because of a fear of falling?  No      Home Ecologist residence      Prior Function   Level of Independence Independent    Vocation Full time employment    Vocation Requirements ER Nurse      Cognition   Overall Cognitive Status Within Functional Limits for tasks  assessed      Observation/Other Assessments   Focus on Therapeutic Outcomes (FOTO)  57% function   was 21% function     AROM   Left Knee Extension 0    Left Knee Flexion 128      Strength   Right Hip Flexion 5/5    Right Hip Extension 4+/5    Right Hip ABduction 4+/5    Left Hip Flexion 5/5    Left Hip Extension 4/5    Left Hip ABduction 4/5    Right Knee Flexion 5/5    Right Knee Extension 5/5    Left Knee Flexion 4/5    Left Knee Extension 4/5      Flexibility   Soft Tissue Assessment /Muscle Length yes    Quadriceps Mod restriction in LT quad      Ambulation/Gait   Ambulation/Gait Yes    Ambulation/Gait Assistance 7: Independent    Assistive device None    Gait Pattern Within Functional Limits;Step-through pattern    Ambulation Surface Level;Indoor    Stairs Yes    Stairs Assistance 7: Independent    Stair Management Technique One rail Left;Step to pattern    Number of Stairs 8    Height of Stairs 7                           [facility] Adult PT Treatment/Exercise - 09/06/21 0001       Knee/Hip Exercises: Stretches   Gastroc Stretch Both;3 reps;30 seconds    Gastroc Stretch Limitations slant board      Knee/Hip Exercises: Aerobic   Recumbent Bike 5 min Lv 3      Knee/Hip Exercises: Machines for Strengthening   Cybex Knee Flexion 2 plates 2 x 10 eccentrics (2 down 1 up)    Other Machine TKE 3 plates x20      Knee/Hip Exercises: Standing   Heel Raises 20 reps    Hip Abduction Both;2 sets;10 reps    Abduction Limitations RTB    Hip Extension Both;2 sets;10 reps    Extension Limitations RTB    Forward Step Up Left;2 sets;10 reps;Hand Hold: 1;Step Height: 6"    Step Down Left;2 sets;10 reps;Hand Hold: 1;Step Height: 6"    Functional Squat 2 sets;10 reps    Functional Squat Limitations partial range to tolerance    SLS 3 x 20" on black foam pad  PT Short Term Goals - 09/06/21 0856       PT SHORT TERM GOAL  #1   Title Patient will be independent with initial HEP and self-management strategies to improve functional outcomes    Baseline Reports compliance    Time 4    Period Weeks    Status Achieved    Target Date 09/04/21      PT SHORT TERM GOAL #2   Title Patient will have equal to or > 4/5 MMT throughout LLE to improve ability to perform functional mobility, stair ambulation and ADLs.    Baseline See MMT    Time 4    Period Weeks    Status Achieved    Target Date 09/04/21      PT SHORT TERM GOAL #3   Title Patient will have LT knee AROM 0-110 degrees to improve functional mobility and facilitate squatting to pick up items from floor.    Baseline 0-128    Time 4    Period Weeks    Status Achieved    Target Date 09/04/21               PT Long Term Goals - 09/06/21 0856       PT LONG TERM GOAL #1   Title Patient will improve FOTO score to predicted value to indicate improvement in functional outcomes    Baseline See FOTO    Time 10    Period Weeks    Status Partially Met    Target Date 10/16/21      PT LONG TERM GOAL #2   Title Patient will be independent with advance HEP and self-management strategies to improve functional outcomes    Time 10    Period Weeks    Status On-going    Target Date 10/09/21      PT LONG TERM GOAL #3   Title Patient will report at least 85% overall improvement in subjective complaint to indicate improvement in ability to perform ADLs.    Baseline 80-85% improved    Time 10    Period Weeks    Status Partially Met    Target Date 10/09/21      PT LONG TERM GOAL #4   Title Patient will have equal to or > 4+/5 MMT throughout BLE to improve ability to perform functional mobility, stair ambulation and ADLs.    Baseline See MMT    Time 10    Period Weeks    Status Partially Met    Target Date 10/09/21      PT LONG TERM GOAL #5   Title Patient will have LT knee AROM 0-130 degrees to improve functional mobility and facilitate squatting to  pick up items from floor.    Baseline 0-128    Time 10    Period Weeks    Status Partially Met    Target Date 10/09/21                   Plan - 09/06/21 0857     Clinical Impression Statement Patient is making good progress. Good improvement in MMT and AROM. Patient able to walk with normal gait mechanics and improved stair ambulation. Patient remaining limited by mild LE weakness, namely quad strength, which continues to negatively impact functional ability and stair navigation. Patient will continue to benefit from skilled therapy services to address remaining deficits for reduced pain and return to PLOF with ADLs.    Examination-Activity Limitations Stand;Stairs;Squat;Transfers;Locomotion Level    Examination-Participation  Restrictions Occupation;Yard Work;Community Activity;Cleaning    Stability/Clinical Decision Making Stable/Uncomplicated    Rehab Potential Good    PT Frequency 2x / week    PT Duration --   10 weeks   PT Treatment/Interventions ADLs/Self Care Home Management;Biofeedback;Cryotherapy;Electrical Stimulation;Iontophoresis 62m/ml Dexamethasone;Moist Heat;Traction;Balance training;Manual techniques;Therapeutic exercise;Taping;Splinting;Vasopneumatic Device;Therapeutic activities;Functional mobility training;Orthotic Fit/Training;Stair training;Gait training;DME Instruction;Patient/family education;Passive range of motion;Dry needling;Energy conservation;Joint Manipulations;Spinal Manipulations;Contrast Bath;Fluidtherapy;Scar mobilization;Neuromuscular re-education;Parrafin;Ultrasound;Compression bandaging;Visual/perceptual remediation/compensation    PT Next Visit Plan Conitnue with ACL rehab per post op protocol (see copy in protocol book, or find on therapist desk).Continue HS and quad strengthening    PT Home Exercise Plan Eval: quad set, SLR, AAROM heel slide, glute set and ankle pump 2/8 sidelying hip abduction; 2/14 - glut and ITB/TFL stretches, scar massage 3/7  heel raise, mini squat, banded hip abduction/ extension    Consulted and Agree with Plan of Care Patient             Patient will benefit from skilled therapeutic intervention in order to improve the following deficits and impairments:  Pain, Decreased strength, Decreased activity tolerance, Decreased balance, Decreased mobility, Difficulty walking, Impaired flexibility, Decreased range of motion, Abnormal gait, Increased edema, Increased fascial restricitons, Improper body mechanics  Visit Diagnosis: Left knee pain, unspecified chronicity  Other abnormalities of gait and mobility  Stiffness of left knee, not elsewhere classified  Muscle weakness (generalized)     Problem List 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

## 2021-09-08 ENCOUNTER — Encounter: Payer: 59 | Admitting: Surgical

## 2021-09-11 ENCOUNTER — Encounter: Payer: Self-pay | Admitting: Orthopedic Surgery

## 2021-09-12 ENCOUNTER — Other Ambulatory Visit: Payer: Self-pay

## 2021-09-12 ENCOUNTER — Telehealth: Payer: Self-pay | Admitting: Orthopedic Surgery

## 2021-09-12 ENCOUNTER — Ambulatory Visit (HOSPITAL_COMMUNITY): Payer: 59 | Admitting: Physical Therapy

## 2021-09-12 DIAGNOSIS — M25662 Stiffness of left knee, not elsewhere classified: Secondary | ICD-10-CM

## 2021-09-12 DIAGNOSIS — M6281 Muscle weakness (generalized): Secondary | ICD-10-CM

## 2021-09-12 DIAGNOSIS — M25562 Pain in left knee: Secondary | ICD-10-CM | POA: Diagnosis not present

## 2021-09-12 DIAGNOSIS — R2689 Other abnormalities of gait and mobility: Secondary | ICD-10-CM

## 2021-09-12 NOTE — Therapy (Signed)
?OUTPATIENT PHYSICAL THERAPY TREATMENT NOTE ? ? ?Patient Name: Rose Delgado ?MRN: [id number] ?DOB:[birth year], 61 y.o., female ?Today's Date: 09/12/2021 ? ?PCP: Rose Bis, MD ?REFERRING PROVIDER: Caryl Bis, MD ?     Gloriann Loan Pa-C ? ? PT End of Session - 09/12/21 1607   ? ? Visit Number 8   ? Number of Visits 20   ? Date for PT Re-Evaluation 10/16/21   ? Authorization Type [place] [employer]   ? Progress Note Due on Visit 17   ? PT Start Time 1537   ? PT Stop Time 9628   ? PT Time Calculation (min) 40 min   ? Activity Tolerance Patient tolerated treatment well   ? Behavior During Therapy [name] for tasks assessed/performed   ? ?  ?  ? ?  ? ? ?Past Medical History:  ?Diagnosis Date  ? Allergic rhinitis   ? Angio-edema   ? Fibroids   ? uterine  ? GERD (gastroesophageal reflux disease)   ? Hypertension   ? Irregular heart beat   ? Migraines   ? Perimenopause 03/09/2014  ? Postmenopausal 2016  ? Urticaria   ? ?Past Surgical History:  ?Procedure Laterality Date  ? ANTERIOR CRUCIATE LIGAMENT REPAIR Left 07/18/2021  ? Procedure: LEFT KNEE ANTERIOR CRUCIATE LIGAMENT RECONSTRUCTION, HAMSTRING AUTOGRAFT, MENISCAL DEBRIDEMENT;  Surgeon: Meredith Pel, MD;  Location: [place];  Service: Orthopedics;  Laterality: Left;  ? BREAST BIOPSY Left 2008  ? benign  ? COLONOSCOPY  06/2011  ? Dr. Britta Mccreedy: per patient it was normal, follow up in 10 years.   ? ESOPHAGOGASTRODUODENOSCOPY    ? Dr. Berneta Sages: late 1990s/early 2000  ? ESOPHAGOGASTRODUODENOSCOPY N/A 05/19/2014  ? SLF:NO OBVIOUS SOURCE FOR DYSGEUSIA IDENTIFED/MILD Non-erosive gastritis  ? INCISION AND DRAINAGE Right 07/31/2017  ? Procedure: INCISION AND DRAINAGE RIGHT THUMB NAIL BED;  Surgeon: Charlotte Crumb, MD;  Location: [place];  Service: Orthopedics;  Laterality: Right;  ? INGUINAL HERNIA REPAIR    ? KNEE SURGERY Right   ? NAILBED REPAIR Right 07/31/2017  ? Procedure: NAILBED BIOPSY;  Surgeon: Charlotte Crumb, MD;  Location: [place];  Service: Orthopedics;  Laterality: Right;  ? TONSILLECTOMY AND ADENOIDECTOMY    ? ?Patient Active Problem List  ? Diagnosis Date Noted  ? Left anterior cruciate ligament tear   ? Acute medial meniscal tear, left, subsequent encounter   ? Bilateral tennis elbow 06/01/2020  ? PMB (postmenopausal bleeding) 01/07/2019  ? Encounter for gynecological examination with Papanicolaou smear of cervix 01/07/2019  ? Screening for colorectal cancer 01/07/2019  ? Seasonal and perennial allergic rhinitis 08/27/2018  ? 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  ? Pain in finger of right hand 06/27/2017  ? Menorrhagia 05/28/2016  ? Left hand pain 02/16/2016  ? 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 Nausea with vomiting 07/14/2014  ? Dysgeusia 03/11/2014  ? GERD (gastroesophageal reflux disease) 03/11/2014  ? Hot flashes 03/09/2014  ? Perimenopause 03/09/2014  ? Fibroids 03/09/2014  ? ? ?REFERRING DIAG: LT ACL reconstruction with hamstring autograft  ?   Surgical onset date: 07/18/2021 ? ?THERAPY DIAG:  ?Left knee pain, unspecified chronicity ? ?Stiffness of left knee, not elsewhere classified ? ?Muscle weakness (generalized) ? ?Other abnormalities of gait and mobility ? ?PERTINENT HISTORY: none ? ?PRECAUTIONS: ACL protocol; WBAT ? 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  Currently 7/10 ? ?PAIN:  ?Are you having pain? Yes: NPRS scale: 7/10 ?Pain location: Lt knee ?Pain description: sore, sensitive ?Aggravating factors: increased activity; WBing ?Relieving factors: rest, ice, compression , elevation ? ? ? ? ?TODAY'S TREATMENT:  ?Gastroc Stretch Both;3 reps;30 seconds   ?Gastroc Stretch Limitations ? slant board   ?   ?Knee/Hip Exercises: Machines for Strengthening ?Cybex knee flexion 2PL Lt only 3X10 ?Bodycraft leg press 3PL Lt only 3X10 ?Bodycraft TKE 3PL Lt only 3X10 with 5? holds  ?   ?    ?Knee/Hip Exercises: Standing  ?Forward Step Up Left;2 sets;10 reps;Hand Hold: 1;Step Height: 6"   ?Step Down ? ?Lateral step ups Left;2 sets;10 reps ;Hand Hold: 1;Step Height: 6? eccentric control  ?Step height 8?, eccentric control 2X10, 1 HHA  ?Functional Squat ? ?Stair negotiation 5Rt reciprocally with 1 HHA 2 sets;10 reps  ?  ? ? ? ?PATIENT EDUCATION: ?Education details: continue with use of compression and HEP ?Person educated: Patient ?Education method: Explanation ?Education comprehension: verbalized understanding ? ? ?HOME EXERCISE PROGRAM: ?Eval: quad set, SLR, AAROM heel slide, glute set and ankle pump  ? ? PT Short Term Goals - 09/06/21 0856   ? ?  ? PT SHORT TERM GOAL #1  ? Title Patient will be independent with initial HEP and self-management strategies to improve functional outcomes   ? Baseline Reports compliance   ? Time 4   ? Period Weeks   ? Status Achieved   ? Target Date 09/04/21   ?  ? PT SHORT TERM GOAL #2  ? Title Patient will have equal to or > 4/5 MMT throughout LLE to improve ability to perform functional mobility, stair ambulation and ADLs.   ? Baseline See MMT   ? Time 4   ? Period Weeks   ? Status Achieved   ? Target Date 09/04/21   ?  ? PT SHORT TERM GOAL #3  ? Title Patient will have LT knee AROM 0-110 degrees to improve functional mobility and facilitate squatting to pick up items from floor.   ? Baseline 0-128   ? Time 4   ? Period Weeks   ? Status Achieved   ? Target Date 09/04/21   ? ?  ?  ? ?  ? ? ? PT Long Term Goals - 09/06/21 0856   ? ?  ? PT LONG TERM GOAL #1  ? Title Patient will improve FOTO score to predicted value to indicate improvement in functional outcomes   ? Baseline See FOTO   ? Time 10   ? Period Weeks   ? Status Partially Met   ? Target Date 10/16/21   ?  ? PT LONG TERM GOAL #2  ? Title Patient will be independent with advance HEP and self-management strategies to improve functional outcomes   ? Time 10   ? Period Weeks   ? Status On-going   ? Target Date  10/09/21   ?  ? PT LONG TERM GOAL #3  ? Title Patient will report at least 85% overall improvement in subjective complaint to indicate improvement in ability to perform ADLs.   ? Baseline 80-85% improved   ? Time 10   ? Period Weeks   ? Status Partially Met   ? Target Date 10/09/21   ?  ? PT LONG TERM GOAL #4  ? Title Patient will have equal to or > 4+/5 MMT throughout BLE to improve ability to perform functional mobility, stair ambulation and ADLs.   ?  Baseline See MMT   ? Time 10   ? Period Weeks   ? Status Partially Met   ? Target Date 10/09/21   ?  ? PT LONG TERM GOAL #5  ? Title Patient will have LT knee AROM 0-130 degrees to improve functional mobility and facilitate squatting to pick up items from floor.   ? Baseline 0-128   ? Time 10   ? Period Weeks   ? Status Partially Met   ? Target Date 10/09/21   ? ?  ?  ? ?  ? ?Assesment Pt now back at work working mostly full days (8hr).  States yesterday was rough so wore her compression stockings today which helped.  Added leg press today in addition to established hamstring curl which continues to be challenging.  Eccentric strength is lacking with visible fatigue with step activity and use of UE to fully control.  Pt was able to negotiate stairs reciprocally with minor deviations.  ?Examination-Activity Limitations Stand;Stairs;Squat;Transfers;Locomotion Level   ?Examination-Participation Restrictions Occupation;Yard Work;Community Activity;Cleaning   ?Stability/Clinical Decision Making Stable/Uncomplicated   ?Rehab Potential Good   ?PT Frequency 2x / week   ?PT Duration --   10 weeks  ?PT Treatment/Interventions ADLs/Self Care Home Management;Biofeedback;Cryotherapy;Electrical Stimulation;Iontophoresis 69m/ml Dexamethasone;Moist Heat;Traction;Balance training;Manual techniques;Therapeutic exercise;Taping;Splinting;Vasopneumatic Device;Therapeutic activities;Functional mobility training;Orthotic Fit/Training;Stair training;Gait training;DME  Instruction;Patient/family education;Passive range of motion;Dry needling;Energy conservation;Joint Manipulations;Spinal Manipulations;Contrast Bath;Fluidtherapy;Scar mobilization;Neuromuscular re-education;Parrafin;Ultrasound;Compressi

## 2021-09-12 NOTE — Telephone Encounter (Signed)
Spoke with pt, [employer] told pt she was certified through 03/09/2640.I refaxed [employer] forms with cover letter reiterating patient remained oow through 09/10/2021, returning to work 09/11/2021 as the form indicates. Fax [phone].  ?

## 2021-09-14 ENCOUNTER — Ambulatory Visit (HOSPITAL_COMMUNITY): Payer: 59

## 2021-09-14 ENCOUNTER — Encounter (HOSPITAL_COMMUNITY): Payer: Self-pay

## 2021-09-14 ENCOUNTER — Other Ambulatory Visit: Payer: Self-pay

## 2021-09-14 DIAGNOSIS — M6281 Muscle weakness (generalized): Secondary | ICD-10-CM

## 2021-09-14 DIAGNOSIS — M25562 Pain in left knee: Secondary | ICD-10-CM | POA: Diagnosis not present

## 2021-09-14 DIAGNOSIS — R2689 Other abnormalities of gait and mobility: Secondary | ICD-10-CM

## 2021-09-14 DIAGNOSIS — M25662 Stiffness of left knee, not elsewhere classified: Secondary | ICD-10-CM | POA: Diagnosis not present

## 2021-09-14 NOTE — Therapy (Addendum)
?OUTPATIENT PHYSICAL THERAPY TREATMENT NOTE ? ? ?Patient Name: Rose Delgado ?MRN: [id number] ?DOB:January 14, 1961, 61 y.o., female ?Today's Date: 09/15/2021 ? ?PCP: Caryl Bis, MD ?REFERRING PROVIDER: Gloriann Loan Pa-C  ? ? PT End of Session - 09/14/21 1618   ? ? Visit Number 9   ? Number of Visits 20   ? Date for PT Re-Evaluation 10/16/21   ? Authorization Type [place] [facility]   ? Progress Note Due on Visit 17   ? PT Start Time 1535   ? PT Stop Time 1615   ? PT Time Calculation (min) 40 min   ? Activity Tolerance Patient tolerated treatment well   ? Behavior During Therapy [facility] for tasks assessed/performed   ? ?  ?  ? ?  ? ? ?Past Medical History:  ?Diagnosis Date  ? Allergic rhinitis   ? Angio-edema   ? Fibroids   ? uterine  ? GERD (gastroesophageal reflux disease)   ? Hypertension   ? Irregular heart beat   ? Migraines   ? Perimenopause 03/09/2014  ? Postmenopausal 2016  ? Urticaria   ? ?Past Surgical History:  ?Procedure Laterality Date  ? ANTERIOR CRUCIATE LIGAMENT REPAIR Left 07/18/2021  ? Procedure: LEFT KNEE ANTERIOR CRUCIATE LIGAMENT RECONSTRUCTION, HAMSTRING AUTOGRAFT, MENISCAL DEBRIDEMENT;  Surgeon: Meredith Pel, MD;  Location: [place];  Service: Orthopedics;  Laterality: Left;  ? BREAST BIOPSY Left 2008  ? benign  ? COLONOSCOPY  06/2011  ? Dr. Britta Mccreedy: per patient it was normal, follow up in 10 years.   ? ESOPHAGOGASTRODUODENOSCOPY    ? Dr. Berneta Sages: late 1990s/early 2000  ? ESOPHAGOGASTRODUODENOSCOPY N/A 05/19/2014  ? SLF:NO OBVIOUS SOURCE FOR DYSGEUSIA IDENTIFED/MILD Non-erosive gastritis  ? INCISION AND DRAINAGE Right 07/31/2017  ? Procedure: INCISION AND DRAINAGE RIGHT THUMB NAIL BED;  Surgeon: Charlotte Crumb, MD;  Location: [place];  Service: Orthopedics;  Laterality: Right;  ? INGUINAL HERNIA REPAIR    ? KNEE SURGERY Right   ? NAILBED REPAIR Right 07/31/2017  ? Procedure: NAILBED BIOPSY;  Surgeon: Charlotte Crumb, MD;  Location: [place];  Service:  Orthopedics;  Laterality: Right;  ? TONSILLECTOMY AND ADENOIDECTOMY    ? ?Patient Active Problem List  ? Diagnosis Date Noted  ? Left anterior cruciate ligament tear   ? Acute medial meniscal tear, left, subsequent encounter   ? Bilateral tennis elbow 06/01/2020  ? PMB (postmenopausal bleeding) 01/07/2019  ? Encounter for gynecological examination with Papanicolaou smear of cervix 01/07/2019  ? Screening for colorectal cancer 01/07/2019  ? Seasonal and perennial allergic rhinitis 08/27/2018  ? 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  ? Pain in finger of right hand 06/27/2017  ? Menorrhagia 05/28/2016  ? Left hand pain 02/16/2016  ? 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 Nausea with vomiting 07/14/2014  ? Dysgeusia 03/11/2014  ? GERD (gastroesophageal reflux disease) 03/11/2014  ? Hot flashes 03/09/2014  ? Perimenopause 03/09/2014  ? Fibroids 03/09/2014  ? ? ?REFERRING DIAG: LT ACL reconstruction with hamstring autograft ? ?THERAPY DIAG:  ?Left knee pain, unspecified chronicity ? ?Stiffness of left knee, not elsewhere classified ? ?Muscle weakness (generalized) ? ?Other abnormalities of gait and mobility ? ?PERTINENT HISTORY: LT ACL reconstruction with hamstring autograft ? ?PRECAUTIONS: Knee   ACL protocol  ? ?SUBJECTIVE: Pt stated knee is tender and hypersensitive.  Reports stairs are most difficult at home. ? ?PAIN:  ?Are you having pain? Yes: NPRS scale: 6/10 ?Pain location: Lt knee ?Pain  description: Tender and hypersensitive ?Aggravating factors: stairs ?Relieving factors: ice, compression, rest ? ? ? ? ?TODAY'S TREATMENT:  ? ?Cybex knee flexion 2PL Lt only 3X10 ?Bodycraft leg press 3PL Lt only 3X10 ?Bodycraft  Retro/ forwards 3Pl 5RT ? ? ?Lateral step up 6in step 20x ?Forward step up power up 6in ?Step down 6in 2x 10 ?Squat 20x ?SLS Lt 31" ?Vector stance 3x 5" ? ?Stairs 7in reciprocal pattern intermittent HHA ? ?Seated: stool scoot 2RT ? ? ?PATIENT  EDUCATION ?  Educated on benefits with thigh high vs knee sleeve for foot edema, measurements given and handout given- verbalized understanding ? ? ?HOME EXERCISE PROGRAM: ?quad set, SLR, AAROM heel slide, glute set and ankle pump   ? ? PT Short Term Goals - 09/06/21 0856   ? ?  ? PT SHORT TERM GOAL #1  ? Title Patient will be independent with initial HEP and self-management strategies to improve functional outcomes   ? Baseline Reports compliance   ? Time 4   ? Period Weeks   ? Status Achieved   ? Target Date 09/04/21   ?  ? PT SHORT TERM GOAL #2  ? Title Patient will have equal to or > 4/5 MMT throughout LLE to improve ability to perform functional mobility, stair ambulation and ADLs.   ? Baseline See MMT   ? Time 4   ? Period Weeks   ? Status Achieved   ? Target Date 09/04/21   ?  ? PT SHORT TERM GOAL #3  ? Title Patient will have LT knee AROM 0-110 degrees to improve functional mobility and facilitate squatting to pick up items from floor.   ? Baseline 0-128   ? Time 4   ? Period Weeks   ? Status Achieved   ? Target Date 09/04/21   ? ?  ?  ? ?  ? ? ? PT Long Term Goals - 09/06/21 0856   ? ?  ? PT LONG TERM GOAL #1  ? Title Patient will improve FOTO score to predicted value to indicate improvement in functional outcomes   ? Baseline See FOTO   ? Time 10   ? Period Weeks   ? Status Partially Met   ? Target Date 10/16/21   ?  ? PT LONG TERM GOAL #2  ? Title Patient will be independent with advance HEP and self-management strategies to improve functional outcomes   ? Time 10   ? Period Weeks   ? Status On-going   ? Target Date 10/09/21   ?  ? PT LONG TERM GOAL #3  ? Title Patient will report at least 85% overall improvement in subjective complaint to indicate improvement in ability to perform ADLs.   ? Baseline 80-85% improved   ? Time 10   ? Period Weeks   ? Status Partially Met   ? Target Date 10/09/21   ?  ? PT LONG TERM GOAL #4  ? Title Patient will have equal to or > 4+/5 MMT throughout BLE to improve ability  to perform functional mobility, stair ambulation and ADLs.   ? Baseline See MMT   ? Time 10   ? Period Weeks   ? Status Partially Met   ? Target Date 10/09/21   ?  ? PT LONG TERM GOAL #5  ? Title Patient will have LT knee AROM 0-130 degrees to improve functional mobility and facilitate squatting to pick up items from floor.   ? Baseline 0-128   ?  Time 10   ? Period Weeks   ? Status Partially Met   ? Target Date 10/09/21   ? ?  ?  ? ?  ? ? ? Plan - 09/14/21 1551   ? ? Clinical Impression Statement PT wearing compression sleeve around knee, reports comfort during work but noticed increased swelling anke/foot.  Educated on benefits with compression socks and thigh high to address knee edema.  Measurements taken and given handout for [facility] or [place].  Continued session focus per ACL protocol.  Added stool scoot for hamstring strengthening and SLS activities for hip stability.  No reports of increased pain through session, was limited by visible quad fatigue especially following step down training, decreased eccentric control.   ? Examination-Activity Limitations Stand;Stairs;Squat;Transfers;Locomotion Level   ? Examination-Participation Restrictions Occupation;Yard Work;Community Activity;Cleaning   ? Stability/Clinical Decision Making Stable/Uncomplicated   ? Clinical Decision Making Low   ? Rehab Potential Good   ? PT Frequency 2x / week   ? PT Duration --   10 weeks  ? PT Treatment/Interventions ADLs/Self Care Home Management;Biofeedback;Cryotherapy;Electrical Stimulation;Iontophoresis 80m/ml Dexamethasone;Moist Heat;Traction;Balance training;Manual techniques;Therapeutic exercise;Taping;Splinting;Vasopneumatic Device;Therapeutic activities;Functional mobility training;Orthotic Fit/Training;Stair training;Gait training;DME Instruction;Patient/family education;Passive range of motion;Dry needling;Energy conservation;Joint Manipulations;Spinal Manipulations;Contrast Bath;Fluidtherapy;Scar mobilization;Neuromuscular  re-education;Parrafin;Ultrasound;Compression bandaging;Visual/perceptual remediation/compensation   ? PT Next Visit Plan Add lunges next session.  Continue with ACL rehab per post op protocol (see copy in protocol book, or find

## 2021-09-19 ENCOUNTER — Encounter (HOSPITAL_COMMUNITY): Payer: Self-pay | Admitting: Physical Therapy

## 2021-09-19 ENCOUNTER — Other Ambulatory Visit: Payer: Self-pay

## 2021-09-19 ENCOUNTER — Ambulatory Visit (HOSPITAL_COMMUNITY): Payer: 59 | Admitting: Physical Therapy

## 2021-09-19 DIAGNOSIS — M25662 Stiffness of left knee, not elsewhere classified: Secondary | ICD-10-CM | POA: Diagnosis not present

## 2021-09-19 DIAGNOSIS — M6281 Muscle weakness (generalized): Secondary | ICD-10-CM | POA: Diagnosis not present

## 2021-09-19 DIAGNOSIS — R2689 Other abnormalities of gait and mobility: Secondary | ICD-10-CM

## 2021-09-19 DIAGNOSIS — M25562 Pain in left knee: Secondary | ICD-10-CM | POA: Diagnosis not present

## 2021-09-19 NOTE — Therapy (Signed)
?OUTPATIENT PHYSICAL THERAPY TREATMENT NOTE ? ? ?Patient Name: Rose Delgado ?MRN: [id number] ?DOB:04-11-1961, 61 y.o., female ?Today's Date: 09/19/2021 ? ?PCP: Caryl Bis, MD ?REFERRING PROVIDER: Gloriann Loan Pa-C  ? ? PT End of Session - 09/19/21 1515   ? ? Visit Number 10   ? Number of Visits 20   ? Date for PT Re-Evaluation 10/16/21   ? Authorization Type [place] [facility]   ? Progress Note Due on Visit 17   ? PT Start Time 1515   ? PT Stop Time 1600   ? PT Time Calculation (min) 45 min   ? Activity Tolerance Patient tolerated treatment well   ? Behavior During Therapy [facility] for tasks assessed/performed   ? ?  ?  ? ?  ? ? ?Past Medical History:  ?Diagnosis Date  ? Allergic rhinitis   ? Angio-edema   ? Fibroids   ? uterine  ? GERD (gastroesophageal reflux disease)   ? Hypertension   ? Irregular heart beat   ? Migraines   ? Perimenopause 03/09/2014  ? Postmenopausal 2016  ? Urticaria   ? ?Past Surgical History:  ?Procedure Laterality Date  ? ANTERIOR CRUCIATE LIGAMENT REPAIR Left 07/18/2021  ? Procedure: LEFT KNEE ANTERIOR CRUCIATE LIGAMENT RECONSTRUCTION, HAMSTRING AUTOGRAFT, MENISCAL DEBRIDEMENT;  Surgeon: Meredith Pel, MD;  Location: [place];  Service: Orthopedics;  Laterality: Left;  ? BREAST BIOPSY Left 2008  ? benign  ? COLONOSCOPY  06/2011  ? Dr. Britta Mccreedy: per patient it was normal, follow up in 10 years.   ? ESOPHAGOGASTRODUODENOSCOPY    ? Dr. Berneta Sages: late 1990s/early 2000  ? ESOPHAGOGASTRODUODENOSCOPY N/A 05/19/2014  ? SLF:NO OBVIOUS SOURCE FOR DYSGEUSIA IDENTIFED/MILD Non-erosive gastritis  ? INCISION AND DRAINAGE Right 07/31/2017  ? Procedure: INCISION AND DRAINAGE RIGHT THUMB NAIL BED;  Surgeon: Charlotte Crumb, MD;  Location: [place];  Service: Orthopedics;  Laterality: Right;  ? INGUINAL HERNIA REPAIR    ? KNEE SURGERY Right   ? NAILBED REPAIR Right 07/31/2017  ? Procedure: NAILBED BIOPSY;  Surgeon: Charlotte Crumb, MD;  Location: [place];  Service:  Orthopedics;  Laterality: Right;  ? TONSILLECTOMY AND ADENOIDECTOMY    ? ?Patient Active Problem List  ? Diagnosis Date Noted  ? Left anterior cruciate ligament tear   ? Acute medial meniscal tear, left, subsequent encounter   ? Bilateral tennis elbow 06/01/2020  ? PMB (postmenopausal bleeding) 01/07/2019  ? Encounter for gynecological examination with Papanicolaou smear of cervix 01/07/2019  ? Screening for colorectal cancer 01/07/2019  ? Seasonal and perennial allergic rhinitis 08/27/2018  ? 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  ? Pain in finger of right hand 06/27/2017  ? Menorrhagia 05/28/2016  ? Left hand pain 02/16/2016  ? 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 Nausea with vomiting 07/14/2014  ? Dysgeusia 03/11/2014  ? GERD (gastroesophageal reflux disease) 03/11/2014  ? Hot flashes 03/09/2014  ? Perimenopause 03/09/2014  ? Fibroids 03/09/2014  ? ? ?REFERRING DIAG: LT ACL reconstruction with hamstring autograft ? ?THERAPY DIAG:  ?Left knee pain, unspecified chronicity ? ?Stiffness of left knee, not elsewhere classified ? ?Muscle weakness (generalized) ? ?Other abnormalities of gait and mobility ? ?PERTINENT HISTORY: LT ACL reconstruction with hamstring autograft ? ?PRECAUTIONS: Knee   ACL protocol  ? ?SUBJECTIVE: Doing ok. She is concerned about ongoing swelling in knee. She messaged PA about this. Still having pain with increased activity.  ? ?PAIN:  ?Are you having pain? Yes: NPRS  scale: 6/10 ?Pain location: Lt knee ?Pain description: Tender and hypersensitive ?Aggravating factors: stairs ?Relieving factors: ice, compression, rest ? ? ? ? ?TODAY'S TREATMENT:  ? ?09/19/21 ?Recumbent bike 5 min warmup for ROM  ?Step up 6 inch 2 x 10  ?Step downs 6 inch 2 x 10 ?Standing hip abduction GTB 2 x10 ?Standing hip extension GTB 2 x 10 ?TKE 3 plates x20  ?Leg press single leg 3 plates 2 x 10  ?Hamstring curl cybex 2.5 plates 2 x 10 ?Single leg stance 2 x 20" (added foam 3  x 30") ?Weighted walkouts retro 4 plates x10  ? ? ? ? ?PATIENT EDUCATION ?  On exercise form and function, updated HEP.  ? ? ?HOME EXERCISE PROGRAM: ?Access Code: [email or web address] ?URL: [email or web address] ?Date: 09/19/2021 ?Prepared by: Josue Hector ? ?Exercises ?Standing Terminal Knee Extension with Resistance - 2-3 x daily - 7 x weekly - 2 sets - 10 reps ?Single Leg Stance on Pillow - 2-3 x daily - 7 x weekly - 1 sets - 3 reps - 30 second hold ?quad set, SLR, AAROM heel slide, glute set and ankle pump   ? ? PT Short Term Goals - 09/06/21 0856   ? ?  ? PT SHORT TERM GOAL #1  ? Title Patient will be independent with initial HEP and self-management strategies to improve functional outcomes   ? Baseline Reports compliance   ? Time 4   ? Period Weeks   ? Status Achieved   ? Target Date 09/04/21   ?  ? PT SHORT TERM GOAL #2  ? Title Patient will have equal to or > 4/5 MMT throughout LLE to improve ability to perform functional mobility, stair ambulation and ADLs.   ? Baseline See MMT   ? Time 4   ? Period Weeks   ? Status Achieved   ? Target Date 09/04/21   ?  ? PT SHORT TERM GOAL #3  ? Title Patient will have LT knee AROM 0-110 degrees to improve functional mobility and facilitate squatting to pick up items from floor.   ? Baseline 0-128   ? Time 4   ? Period Weeks   ? Status Achieved   ? Target Date 09/04/21   ? ?  ?  ? ?  ? ? ? PT Long Term Goals - 09/06/21 0856   ? ?  ? PT LONG TERM GOAL #1  ? Title Patient will improve FOTO score to predicted value to indicate improvement in functional outcomes   ? Baseline See FOTO   ? Time 10   ? Period Weeks   ? Status Partially Met   ? Target Date 10/16/21   ?  ? PT LONG TERM GOAL #2  ? Title Patient will be independent with advance HEP and self-management strategies to improve functional outcomes   ? Time 10   ? Period Weeks   ? Status On-going   ? Target Date 10/09/21   ?  ? PT LONG TERM GOAL #3  ? Title Patient will report at least 85% overall improvement in  subjective complaint to indicate improvement in ability to perform ADLs.   ? Baseline 80-85% improved   ? Time 10   ? Period Weeks   ? Status Partially Met   ? Target Date 10/09/21   ?  ? PT LONG TERM GOAL #4  ? Title Patient will have equal to or > 4+/5 MMT throughout BLE to improve ability to perform functional mobility, stair ambulation  and ADLs.   ? Baseline See MMT   ? Time 10   ? Period Weeks   ? Status Partially Met   ? Target Date 10/09/21   ?  ? PT LONG TERM GOAL #5  ? Title Patient will have LT knee AROM 0-130 degrees to improve functional mobility and facilitate squatting to pick up items from floor.   ? Baseline 0-128   ? Time 10   ? Period Weeks   ? Status Partially Met   ? Target Date 10/09/21   ? ?  ?  ? ?  ? ? ? Plan - 09/14/21 1551   ? ? Clinical Impression Statement Patient tolerated session well today. Progressed LE strengthening with added green band resistance to hip abduction and extension. Added balance on complaint surface with good return. Updated HEP and issued handout. Patient required minimal cueing throughout session and demos overall good return for form. Patient will continue to benefit from skilled therapy services to reduce remaining deficits and improve functional ability.   ? Examination-Activity Limitations Stand;Stairs;Squat;Transfers;Locomotion Level   ? Examination-Participation Restrictions Occupation;Yard Work;Community Activity;Cleaning   ? Stability/Clinical Decision Making Stable/Uncomplicated   ? Clinical Decision Making Low   ? Rehab Potential Good   ? PT Frequency 2x / week   ? PT Duration --   10 weeks  ? PT Treatment/Interventions ADLs/Self Care Home Management;Biofeedback;Cryotherapy;Electrical Stimulation;Iontophoresis 9m/ml Dexamethasone;Moist Heat;Traction;Balance training;Manual techniques;Therapeutic exercise;Taping;Splinting;Vasopneumatic Device;Therapeutic activities;Functional mobility training;Orthotic Fit/Training;Stair training;Gait training;DME  Instruction;Patient/family education;Passive range of motion;Dry needling;Energy conservation;Joint Manipulations;Spinal Manipulations;Contrast Bath;Fluidtherapy;Scar mobilization;Neuromuscular re-education;Parrafin;U

## 2021-09-21 ENCOUNTER — Other Ambulatory Visit: Payer: Self-pay

## 2021-09-21 ENCOUNTER — Ambulatory Visit (HOSPITAL_COMMUNITY): Payer: 59 | Admitting: Physical Therapy

## 2021-09-21 DIAGNOSIS — R2689 Other abnormalities of gait and mobility: Secondary | ICD-10-CM | POA: Diagnosis not present

## 2021-09-21 DIAGNOSIS — M25662 Stiffness of left knee, not elsewhere classified: Secondary | ICD-10-CM | POA: Diagnosis not present

## 2021-09-21 DIAGNOSIS — M25562 Pain in left knee: Secondary | ICD-10-CM | POA: Diagnosis not present

## 2021-09-21 DIAGNOSIS — M6281 Muscle weakness (generalized): Secondary | ICD-10-CM

## 2021-09-21 NOTE — Therapy (Signed)
?OUTPATIENT PHYSICAL THERAPY TREATMENT NOTE ? ? ?Patient Name: Rose Delgado ?MRN: [id number] ?DOB:Feb 03, 1961, 61 y.o., female ?Today's Date: 09/21/2021 ? ?PCP: Caryl Bis, MD ?REFERRING PROVIDER: Gloriann Loan Pa-C  ? ? PT End of Session - 09/21/21 1534   ? ? Visit Number 11   ? Number of Visits 20   ? Date for PT Re-Evaluation 10/16/21   ? Authorization Type [place] [facility]   ? Progress Note Due on Visit 17   ? PT Start Time 1534   ? PT Stop Time 1615   ? PT Time Calculation (min) 41 min   ? Activity Tolerance Patient tolerated treatment well   ? Behavior During Therapy [facility] for tasks assessed/performed   ? ?  ?  ? ?  ? ? ? ?Past Medical History:  ?Diagnosis Date  ? Allergic rhinitis   ? Angio-edema   ? Fibroids   ? uterine  ? GERD (gastroesophageal reflux disease)   ? Hypertension   ? Irregular heart beat   ? Migraines   ? Perimenopause 03/09/2014  ? Postmenopausal 2016  ? Urticaria   ? ?Past Surgical History:  ?Procedure Laterality Date  ? ANTERIOR CRUCIATE LIGAMENT REPAIR Left 07/18/2021  ? Procedure: LEFT KNEE ANTERIOR CRUCIATE LIGAMENT RECONSTRUCTION, HAMSTRING AUTOGRAFT, MENISCAL DEBRIDEMENT;  Surgeon: Meredith Pel, MD;  Location: [place];  Service: Orthopedics;  Laterality: Left;  ? BREAST BIOPSY Left 2008  ? benign  ? COLONOSCOPY  06/2011  ? Dr. Britta Mccreedy: per patient it was normal, follow up in 10 years.   ? ESOPHAGOGASTRODUODENOSCOPY    ? Dr. Berneta Sages: late 1990s/early 2000  ? ESOPHAGOGASTRODUODENOSCOPY N/A 05/19/2014  ? SLF:NO OBVIOUS SOURCE FOR DYSGEUSIA IDENTIFED/MILD Non-erosive gastritis  ? INCISION AND DRAINAGE Right 07/31/2017  ? Procedure: INCISION AND DRAINAGE RIGHT THUMB NAIL BED;  Surgeon: Charlotte Crumb, MD;  Location: [place];  Service: Orthopedics;  Laterality: Right;  ? INGUINAL HERNIA REPAIR    ? KNEE SURGERY Right   ? NAILBED REPAIR Right 07/31/2017  ? Procedure: NAILBED BIOPSY;  Surgeon: Charlotte Crumb, MD;  Location: [place];  Service:  Orthopedics;  Laterality: Right;  ? TONSILLECTOMY AND ADENOIDECTOMY    ? ?Patient Active Problem List  ? Diagnosis Date Noted  ? Left anterior cruciate ligament tear   ? Acute medial meniscal tear, left, subsequent encounter   ? Bilateral tennis elbow 06/01/2020  ? PMB (postmenopausal bleeding) 01/07/2019  ? Encounter for gynecological examination with Papanicolaou smear of cervix 01/07/2019  ? Screening for colorectal cancer 01/07/2019  ? Seasonal and perennial allergic rhinitis 08/27/2018  ? 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  ? Pain in finger of right hand 06/27/2017  ? Menorrhagia 05/28/2016  ? Left hand pain 02/16/2016  ? 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 Nausea with vomiting 07/14/2014  ? Dysgeusia 03/11/2014  ? GERD (gastroesophageal reflux disease) 03/11/2014  ? Hot flashes 03/09/2014  ? Perimenopause 03/09/2014  ? Fibroids 03/09/2014  ? ? ?REFERRING DIAG: LT ACL reconstruction with hamstring autograft ? ?THERAPY DIAG:  ?Left knee pain, unspecified chronicity ? ?Stiffness of left knee, not elsewhere classified ? ?Muscle weakness (generalized) ? ?Other abnormalities of gait and mobility ? ?PERTINENT HISTORY: LT ACL reconstruction with hamstring autograft ? ?PRECAUTIONS: Knee   ACL protocol  ? ?SUBJECTIVE:  pt comes today with a TENS unit for instruction; continues to work 8 hour days, 5 days a week.  Pain level remains unchanged on medial side of Lt  knee. ? ?PAIN:  ?Are you having pain? Yes: NPRS scale: 6/10 ?Pain location: Lt knee ?Pain description: Tender and hypersensitive ?Aggravating factors: stairs ?Relieving factors: ice, compression, rest ? ? ? ? ?TODAY'S TREATMENT:  ? ?09/21/21 ? Slant board gastroc stretch 3X30" ? Step up fwd 6 inch 2 x 10 Lt leading 1 UE assist ? Step up lateral 6 inch 15X Lt on step 2 UE assist ?Step downs 6 inch X15 eccentric focus with 1 UE assist ?7" stair negotiation with 1 HR 5RT reciprocally ?Standing hip abduction GTB 2  x10 ?Standing hip extension GTB 2 x 10 ?Single leg stance  on foam 3 x 30") ?Weighted walkouts retro 4 plates x10  ? ?Other:  TENS instruction/placement for Lt knee ? ? ?09/19/21 ?Recumbent bike 5 min warmup for ROM  ?Step up 6 inch 2 x 10  ?Step downs 6 inch 2 x 10 ?Standing hip abduction GTB 2 x10 ?Standing hip extension GTB 2 x 10 ?TKE 3 plates x20  ?Leg press single leg 3 plates 2 x 10  ?Hamstring curl cybex 2.5 plates 2 x 10 ?Single leg stance 2 x 20" (added foam 3 x 30") ?Weighted walkouts retro 4 plates x10  ? ? ? ? ?PATIENT EDUCATION ?  Set up and use of TENS unit; explanation of mechanism ? ? ?HOME EXERCISE PROGRAM: ?Access Code: [email or web address] ?URL: [email or web address] ?Date: 09/19/2021 ?Prepared by: Josue Hector ? ?Exercises ?Standing Terminal Knee Extension with Resistance - 2-3 x daily - 7 x weekly - 2 sets - 10 reps ?Single Leg Stance on Pillow - 2-3 x daily - 7 x weekly - 1 sets - 3 reps - 30 second hold ?quad set, SLR, AAROM heel slide, glute set and ankle pump   ? ? PT Short Term Goals - 09/06/21 0856   ? ?  ? PT SHORT TERM GOAL #1  ? Title Patient will be independent with initial HEP and self-management strategies to improve functional outcomes   ? Baseline Reports compliance   ? Time 4   ? Period Weeks   ? Status Achieved   ? Target Date 09/04/21   ?  ? PT SHORT TERM GOAL #2  ? Title Patient will have equal to or > 4/5 MMT throughout LLE to improve ability to perform functional mobility, stair ambulation and ADLs.   ? Baseline See MMT   ? Time 4   ? Period Weeks   ? Status Achieved   ? Target Date 09/04/21   ?  ? PT SHORT TERM GOAL #3  ? Title Patient will have LT knee AROM 0-110 degrees to improve functional mobility and facilitate squatting to pick up items from floor.   ? Baseline 0-128   ? Time 4   ? Period Weeks   ? Status Achieved   ? Target Date 09/04/21   ? ?  ?  ? ?  ? ? ? PT Long Term Goals - 09/06/21 0856   ? ?  ? PT LONG TERM GOAL #1  ? Title Patient will improve FOTO score  to predicted value to indicate improvement in functional outcomes   ? Baseline See FOTO   ? Time 10   ? Period Weeks   ? Status Partially Met   ? Target Date 10/16/21   ?  ? PT LONG TERM GOAL #2  ? Title Patient will be independent with advance HEP and self-management strategies to improve functional outcomes   ? Time 10   ? Period Weeks   ?  Status On-going   ? Target Date 10/09/21   ?  ? PT LONG TERM GOAL #3  ? Title Patient will report at least 85% overall improvement in subjective complaint to indicate improvement in ability to perform ADLs.   ? Baseline 80-85% improved   ? Time 10   ? Period Weeks   ? Status Partially Met   ? Target Date 10/09/21   ?  ? PT LONG TERM GOAL #4  ? Title Patient will have equal to or > 4+/5 MMT throughout BLE to improve ability to perform functional mobility, stair ambulation and ADLs.   ? Baseline See MMT   ? Time 10   ? Period Weeks   ? Status Partially Met   ? Target Date 10/09/21   ?  ? PT LONG TERM GOAL #5  ? Title Patient will have LT knee AROM 0-130 degrees to improve functional mobility and facilitate squatting to pick up items from floor.   ? Baseline 0-128   ? Time 10   ? Period Weeks   ? Status Partially Met   ? Target Date 10/09/21   ? ?  ?  ? ?  ? ? ? Plan - 09/14/21 1551   ? ? Clinical Impression Statement Patient comes today with personal TENS unit with questions on set up and usage.  Educated on TENS as used for distraction but no physiological purpose of result is gained from device.  Pt able to place electrodes correctly and hook them to unit accordingly.  Pt with good response felt at level 16.  Pt completed therex in clinic while using TENS unit with overall comfort.  Continued with established strengthening exercises with eccentric phase of step downs being most challenging for pt due to end range weakness. PT able to negotiate 7" staircase this session using 1 UE and in reciprocal pattern both directions without complaint or deviations.   Patient required  minimal cueing throughout session and demos overall good form with all actvitiies.   Patient will continue to benefit from skilled therapy services to reduce remaining deficits and improve functional ability

## 2021-09-22 ENCOUNTER — Other Ambulatory Visit (HOSPITAL_COMMUNITY): Payer: Self-pay

## 2021-09-26 ENCOUNTER — Other Ambulatory Visit: Payer: Self-pay

## 2021-09-26 ENCOUNTER — Ambulatory Visit (HOSPITAL_COMMUNITY): Payer: 59 | Admitting: Physical Therapy

## 2021-09-26 DIAGNOSIS — M6281 Muscle weakness (generalized): Secondary | ICD-10-CM | POA: Diagnosis not present

## 2021-09-26 DIAGNOSIS — R2689 Other abnormalities of gait and mobility: Secondary | ICD-10-CM | POA: Diagnosis not present

## 2021-09-26 DIAGNOSIS — M25662 Stiffness of left knee, not elsewhere classified: Secondary | ICD-10-CM

## 2021-09-26 DIAGNOSIS — M25562 Pain in left knee: Secondary | ICD-10-CM | POA: Diagnosis not present

## 2021-09-26 NOTE — Therapy (Signed)
?OUTPATIENT PHYSICAL THERAPY TREATMENT NOTE ? ? ?Patient Name: Rose Delgado ?MRN: [id number] ?DOB:Jul 24, 1960, 61 y.o., female ?Today's Date: 09/26/2021 ? ?PCP: Caryl Bis, MD ?REFERRING PROVIDER: Gloriann Loan Pa-C  ? ? PT End of Session - 09/26/21 1613   ? ? Visit Number 12   ? Number of Visits 20   ? Date for PT Re-Evaluation 10/16/21   ? Authorization Type  [facility]   ? Progress Note Due on Visit 17   ? PT Start Time 1535   ? PT Stop Time 9371   ? PT Time Calculation (min) 39 min   ? Activity Tolerance Patient tolerated treatment well   ? Behavior During Therapy [facility] for tasks assessed/performed   ? ?  ?  ? ?  ? ? ? ? ?Past Medical History:  ?Diagnosis Date  ? Allergic rhinitis   ? Angio-edema   ? Fibroids   ? uterine  ? GERD (gastroesophageal reflux disease)   ? Hypertension   ? Irregular heart beat   ? Migraines   ? Perimenopause 03/09/2014  ? Postmenopausal 2016  ? Urticaria   ? ?Past Surgical History:  ?Procedure Laterality Date  ? ANTERIOR CRUCIATE LIGAMENT REPAIR Left 07/18/2021  ? Procedure: LEFT KNEE ANTERIOR CRUCIATE LIGAMENT RECONSTRUCTION, HAMSTRING AUTOGRAFT, MENISCAL DEBRIDEMENT;  Surgeon: Meredith Pel, MD;  Location: [place];  Service: Orthopedics;  Laterality: Left;  ? BREAST BIOPSY Left 2008  ? benign  ? COLONOSCOPY  06/2011  ? Dr. Britta Mccreedy: per patient it was normal, follow up in 10 years.   ? ESOPHAGOGASTRODUODENOSCOPY    ? Dr. Berneta Sages: late 1990s/early 2000  ? ESOPHAGOGASTRODUODENOSCOPY N/A 05/19/2014  ? SLF:NO OBVIOUS SOURCE FOR DYSGEUSIA IDENTIFED/MILD Non-erosive gastritis  ? INCISION AND DRAINAGE Right 07/31/2017  ? Procedure: INCISION AND DRAINAGE RIGHT THUMB NAIL BED;  Surgeon: Charlotte Crumb, MD;  Location: [place];  Service: Orthopedics;  Laterality: Right;  ? INGUINAL HERNIA REPAIR    ? KNEE SURGERY Right   ? NAILBED REPAIR Right 07/31/2017  ? Procedure: NAILBED BIOPSY;  Surgeon: Charlotte Crumb, MD;  Location: [place];  Service:  Orthopedics;  Laterality: Right;  ? TONSILLECTOMY AND ADENOIDECTOMY    ? ?Patient Active Problem List  ? Diagnosis Date Noted  ? Left anterior cruciate ligament tear   ? Acute medial meniscal tear, left, subsequent encounter   ? Bilateral tennis elbow 06/01/2020  ? PMB (postmenopausal bleeding) 01/07/2019  ? Encounter for gynecological examination with Papanicolaou smear of cervix 01/07/2019  ? Screening for colorectal cancer 01/07/2019  ? Seasonal and perennial allergic rhinitis 08/27/2018  ? 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  ? Pain in finger of right hand 06/27/2017  ? Menorrhagia 05/28/2016  ? Left hand pain 02/16/2016  ? 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 Nausea with vomiting 07/14/2014  ? Dysgeusia 03/11/2014  ? GERD (gastroesophageal reflux disease) 03/11/2014  ? Hot flashes 03/09/2014  ? Perimenopause 03/09/2014  ? Fibroids 03/09/2014  ? ? ?REFERRING DIAG: LT ACL reconstruction with hamstring autograft ? ?THERAPY DIAG:  ?Left knee pain, unspecified chronicity ? ?Stiffness of left knee, not elsewhere classified ? ?Other abnormalities of gait and mobility ? ?Muscle weakness (generalized) ? ?PERTINENT HISTORY: LT ACL reconstruction with hamstring autograft ? ?PRECAUTIONS: Knee   ACL protocol  ? ?SUBJECTIVE:  Pt states her knee feels like theres a band around it and feels that way unless she wears the compression brace.  Reports the edema is better.  Currently 4/10 after getting off 8 hour shift.  STates she is only wearing her TENS at home  ? ?PAIN:  ?Are you having pain? Yes: NPRS scale: 4/10 ?Pain location: Lt knee ?Pain description: Tender and hypersensitive ?Aggravating factors: stairs ?Relieving factors: ice, compression, rest ? ? ? ? ?TODAY'S TREATMENT:  ? ?09/21/21 ? Slant board gastroc stretch 3X30" ? Step up fwd 6 inch 2 x 10 Lt leading 1 UE assist ? Step up lateral 6 inch 15X Lt on step 2 UE assist ?Step downs 6 inch X15 eccentric focus with 1 UE  assist ?7" stair negotiation with 1 HR 5RT reciprocally ?Vector stance on blue foam with 1 HHA 5X5" each LE ?squats to standard chair no UE's 10X  ?Weighted walkouts retro 4 plates x10  ? ?3/81/82 ? Slant board gastroc stretch 3X30" ? Step up fwd 6 inch 2 x 10 Lt leading 1 UE assist ? Step up lateral 6 inch 15X Lt on step 2 UE assist ?Step downs 6 inch X15 eccentric focus with 1 UE assist ?7" stair negotiation with 1 HR 5RT reciprocally ?Standing hip abduction GTB 2 x10 ?Standing hip extension GTB 2 x 10 ?Single leg stance  on foam 3 x 30") ?Weighted walkouts retro 4 plates x10  ? ?Other:  TENS instruction/placement for Lt knee ? ? ?09/19/21 ?Recumbent bike 5 min warmup for ROM  ?Step up 6 inch 2 x 10  ?Step downs 6 inch 2 x 10 ?Standing hip abduction GTB 2 x10 ?Standing hip extension GTB 2 x 10 ?TKE 3 plates x20  ?Leg press single leg 3 plates 2 x 10  ?Hamstring curl cybex 2.5 plates 2 x 10 ?Single leg stance 2 x 20" (added foam 3 x 30") ?Weighted walkouts retro 5 plates x5, 4Pl forward walk and laterals each side X5  ? ? ?PATIENT EDUCATION ?  ?Continue with HEP; verbalized understanding. ? ?HOME EXERCISE PROGRAM: ?Access Code: [email or web address] ?URL: [email or web address] ?Date: 09/19/2021 ?Prepared by: Josue Hector ? ?Exercises ?Standing Terminal Knee Extension with Resistance - 2-3 x daily - 7 x weekly - 2 sets - 10 reps ?Single Leg Stance on Pillow - 2-3 x daily - 7 x weekly - 1 sets - 3 reps - 30 second hold ?quad set, SLR, AAROM heel slide, glute set and ankle pump   ? ? PT Short Term Goals - 09/06/21 0856   ? ?  ? PT SHORT TERM GOAL #1  ? Title Patient will be independent with initial HEP and self-management strategies to improve functional outcomes   ? Baseline Reports compliance   ? Time 4   ? Period Weeks   ? Status Achieved   ? Target Date 09/04/21   ?  ? PT SHORT TERM GOAL #2  ? Title Patient will have equal to or > 4/5 MMT throughout LLE to improve ability to perform functional mobility, stair  ambulation and ADLs.   ? Baseline See MMT   ? Time 4   ? Period Weeks   ? Status Achieved   ? Target Date 09/04/21   ?  ? PT SHORT TERM GOAL #3  ? Title Patient will have LT knee AROM 0-110 degrees to improve functional mobility and facilitate squatting to pick up items from floor.   ? Baseline 0-128   ? Time 4   ? Period Weeks   ? Status Achieved   ? Target Date 09/04/21   ? ?  ?  ? ?  ? ? ? PT Long  Term Goals - 09/06/21 0856   ? ?  ? PT LONG TERM GOAL #1  ? Title Patient will improve FOTO score to predicted value to indicate improvement in functional outcomes   ? Baseline See FOTO   ? Time 10   ? Period Weeks   ? Status Partially Met   ? Target Date 10/16/21   ?  ? PT LONG TERM GOAL #2  ? Title Patient will be independent with advance HEP and self-management strategies to improve functional outcomes   ? Time 10   ? Period Weeks   ? Status On-going   ? Target Date 10/09/21   ?  ? PT LONG TERM GOAL #3  ? Title Patient will report at least 85% overall improvement in subjective complaint to indicate improvement in ability to perform ADLs.   ? Baseline 80-85% improved   ? Time 10   ? Period Weeks   ? Status Partially Met   ? Target Date 10/09/21   ?  ? PT LONG TERM GOAL #4  ? Title Patient will have equal to or > 4+/5 MMT throughout BLE to improve ability to perform functional mobility, stair ambulation and ADLs.   ? Baseline See MMT   ? Time 10   ? Period Weeks   ? Status Partially Met   ? Target Date 10/09/21   ?  ? PT LONG TERM GOAL #5  ? Title Patient will have LT knee AROM 0-130 degrees to improve functional mobility and facilitate squatting to pick up items from floor.   ? Baseline 0-128   ? Time 10   ? Period Weeks   ? Status Partially Met   ? Target Date 10/09/21   ? ?  ?  ? ?  ? ? ? Plan - 09/14/21 1551   ? ? Clinical Impression Statement Continued with established strengthening exercises. Added sit to stand with eccentric control to work on squat form and strength/control of quadricep.  Vector activity also  added using foam surface to further work on glute stability and overall LE control in single leg stance.  PT reported a little discomfort felt in knee with this activity.  Patient required minimal cueing

## 2021-09-28 ENCOUNTER — Ambulatory Visit (HOSPITAL_COMMUNITY): Payer: 59 | Admitting: Physical Therapy

## 2021-09-28 ENCOUNTER — Encounter (HOSPITAL_COMMUNITY): Payer: Self-pay | Admitting: Physical Therapy

## 2021-09-28 DIAGNOSIS — M6281 Muscle weakness (generalized): Secondary | ICD-10-CM | POA: Diagnosis not present

## 2021-09-28 DIAGNOSIS — M25562 Pain in left knee: Secondary | ICD-10-CM

## 2021-09-28 DIAGNOSIS — R2689 Other abnormalities of gait and mobility: Secondary | ICD-10-CM

## 2021-09-28 DIAGNOSIS — M25662 Stiffness of left knee, not elsewhere classified: Secondary | ICD-10-CM

## 2021-09-28 NOTE — Therapy (Signed)
?OUTPATIENT PHYSICAL THERAPY TREATMENT NOTE ? ? ?Patient Name: Rose Delgado ?MRN: [id number] ?DOB:1960-07-25, 61 y.o., female, female ?Today's Date: 09/28/2021 ? ?PCP: Caryl Bis, MD ?REFERRING PROVIDER: Gloriann Loan Pa-C  ? ? PT End of Session - 09/28/21 1518   ? ? Visit Number 13   ? Number of Visits 20   ? Date for PT Re-Evaluation 10/16/21   ? Authorization Type [place] [employer]   ? Progress Note Due on Visit 17   ? PT Start Time 1519   ? PT Stop Time 1600   ? PT Time Calculation (min) 41 min   ? Activity Tolerance Patient tolerated treatment well   ? Behavior During Therapy [facility] for tasks assessed/performed   ? ?  ?  ? ?  ? ? ? ? ?Past Medical History:  ?Diagnosis Date  ? Allergic rhinitis   ? Angio-edema   ? Fibroids   ? uterine  ? GERD (gastroesophageal reflux disease)   ? Hypertension   ? Irregular heart beat   ? Migraines   ? Perimenopause 03/09/2014  ? Postmenopausal 2016  ? Urticaria   ? ?Past Surgical History:  ?Procedure Laterality Date  ? ANTERIOR CRUCIATE LIGAMENT REPAIR Left 07/18/2021  ? Procedure: LEFT KNEE ANTERIOR CRUCIATE LIGAMENT RECONSTRUCTION, HAMSTRING AUTOGRAFT, MENISCAL DEBRIDEMENT;  Surgeon: Meredith Pel, MD;  Location: [place];  Service: Orthopedics;  Laterality: Left;  ? BREAST BIOPSY Left 2008  ? benign  ? COLONOSCOPY  06/2011  ? Dr. Britta Mccreedy: per patient it was normal, follow up in 10 years.   ? ESOPHAGOGASTRODUODENOSCOPY    ? Dr. Berneta Sages: late 1990s/early 2000  ? ESOPHAGOGASTRODUODENOSCOPY N/A 05/19/2014  ? SLF:NO OBVIOUS SOURCE FOR DYSGEUSIA IDENTIFED/MILD Non-erosive gastritis  ? INCISION AND DRAINAGE Right 07/31/2017  ? Procedure: INCISION AND DRAINAGE RIGHT THUMB NAIL BED;  Surgeon: Charlotte Crumb, MD;  Location: [place];  Service: Orthopedics;  Laterality: Right;  ? INGUINAL HERNIA REPAIR    ? KNEE SURGERY Right   ? NAILBED REPAIR Right 07/31/2017  ? Procedure: NAILBED BIOPSY;  Surgeon: Charlotte Crumb, MD;  Location: [place];  Service:  Orthopedics;  Laterality: Right;  ? TONSILLECTOMY AND ADENOIDECTOMY    ? ?Patient Active Problem List  ? Diagnosis Date Noted  ? Left anterior cruciate ligament tear   ? Acute medial meniscal tear, left, subsequent encounter   ? Bilateral tennis elbow 06/01/2020  ? PMB (postmenopausal bleeding) 01/07/2019  ? Encounter for gynecological examination with Papanicolaou smear of cervix 01/07/2019  ? Screening for colorectal cancer 01/07/2019  ? Seasonal and perennial allergic rhinitis 08/27/2018  ? 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  ? Pain in finger of right hand 06/27/2017  ? Menorrhagia 05/28/2016  ? Left hand pain 02/16/2016  ? 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 Nausea with vomiting 07/14/2014  ? Dysgeusia 03/11/2014  ? GERD (gastroesophageal reflux disease) 03/11/2014  ? Hot flashes 03/09/2014  ? Perimenopause 03/09/2014  ? Fibroids 03/09/2014  ? ? ?REFERRING DIAG: LT ACL reconstruction with hamstring autograft ? ?THERAPY DIAG:  ?Left knee pain, unspecified chronicity ? ?Stiffness of left knee, not elsewhere classified ? ?Other abnormalities of gait and mobility ? ?PERTINENT HISTORY: LT ACL reconstruction with hamstring autograft (07/18/21) ? ?PRECAUTIONS: Knee   ACL protocol  ? ?SUBJECTIVE:  Has been wearing brace. Still feels though she has a "tourniquet" at her knee which continues to have pain. Still having some trouble with stairs.  ? ?PAIN:  ?Are you  having pain? Yes: NPRS scale: 2/10 ?Pain location: Lt knee ?Pain description: Tender and hypersensitive ?Aggravating factors: stairs ?Relieving factors: ice, compression, rest ? ? ? ? ?TODAY'S TREATMENT:  ?09/28/21 ?Recumbent bike 5 min AROM lv 3  ?Calf stretch 3 x 30" ?Heel raise x 20 ?Band sidestepping GTB 3 RT ?Monster walks GTB 3RT  ?Step ups 6 inch 2 x 10  ?Step downs 4 inch HHA x 1 2 x 10 ?Cybex leg curl 3 plates 2 x 10 ?TKE 3 plates 2 F02 ?SLS on foam 3 x 30" ? ? ?09/21/21 ? Slant board gastroc stretch  3X30" ? Step up fwd 6 inch 2 x 10 Lt leading 1 UE assist ? Step up lateral 6 inch 15X Lt on step 2 UE assist ?Step downs 6 inch X15 eccentric focus with 1 UE assist ?7" stair negotiation with 1 HR 5RT reciprocally ?Vector stance on blue foam with 1 HHA 5X5" each LE ?squats to standard chair no UE's 10X  ?Weighted walkouts retro 4 plates x10  ? ?6/37/85 ? Slant board gastroc stretch 3X30" ? Step up fwd 6 inch 2 x 10 Lt leading 1 UE assist ? Step up lateral 6 inch 15X Lt on step 2 UE assist ?Step downs 6 inch X15 eccentric focus with 1 UE assist ?7" stair negotiation with 1 HR 5RT reciprocally ?Standing hip abduction GTB 2 x10 ?Standing hip extension GTB 2 x 10 ?Single leg stance  on foam 3 x 30") ?Weighted walkouts retro 4 plates x10  ? ?Other:  TENS instruction/placement for Lt knee ? ? ? ?PATIENT EDUCATION ?  ?Continue with HEP; verbalized understanding. ? ?HOME EXERCISE PROGRAM: ?Access Code: [email or web address] ?URL: [email or web address] ?Date: 09/19/2021 ?Prepared by: Josue Hector ? ?Exercises ?Standing Terminal Knee Extension with Resistance - 2-3 x daily - 7 x weekly - 2 sets - 10 reps ?Single Leg Stance on Pillow - 2-3 x daily - 7 x weekly - 1 sets - 3 reps - 30 second hold ?quad set, SLR, AAROM heel slide, glute set and ankle pump   ? ? PT Short Term Goals - 09/06/21 0856   ? ?  ? PT SHORT TERM GOAL #1  ? Title Patient will be independent with initial HEP and self-management strategies to improve functional outcomes   ? Baseline Reports compliance   ? Time 4   ? Period Weeks   ? Status Achieved   ? Target Date 09/04/21   ?  ? PT SHORT TERM GOAL #2  ? Title Patient will have equal to or > 4/5 MMT throughout LLE to improve ability to perform functional mobility, stair ambulation and ADLs.   ? Baseline See MMT   ? Time 4   ? Period Weeks   ? Status Achieved   ? Target Date 09/04/21   ?  ? PT SHORT TERM GOAL #3  ? Title Patient will have LT knee AROM 0-110 degrees to improve functional mobility and  facilitate squatting to pick up items from floor.   ? Baseline 0-128   ? Time 4   ? Period Weeks   ? Status Achieved   ? Target Date 09/04/21   ? ?  ?  ? ?  ? ? ? PT Long Term Goals - 09/06/21 0856   ? ?  ? PT LONG TERM GOAL #1  ? Title Patient will improve FOTO score to predicted value to indicate improvement in functional outcomes   ? Baseline See FOTO   ? Time 10   ?  Period Weeks   ? Status Partially Met   ? Target Date 10/16/21   ?  ? PT LONG TERM GOAL #2  ? Title Patient will be independent with advance HEP and self-management strategies to improve functional outcomes   ? Time 10   ? Period Weeks   ? Status On-going   ? Target Date 10/09/21   ?  ? PT LONG TERM GOAL #3  ? Title Patient will report at least 85% overall improvement in subjective complaint to indicate improvement in ability to perform ADLs.   ? Baseline 80-85% improved   ? Time 10   ? Period Weeks   ? Status Partially Met   ? Target Date 10/09/21   ?  ? PT LONG TERM GOAL #4  ? Title Patient will have equal to or > 4+/5 MMT throughout BLE to improve ability to perform functional mobility, stair ambulation and ADLs.   ? Baseline See MMT   ? Time 10   ? Period Weeks   ? Status Partially Met   ? Target Date 10/09/21   ?  ? PT LONG TERM GOAL #5  ? Title Patient will have LT knee AROM 0-130 degrees to improve functional mobility and facilitate squatting to pick up items from floor.   ? Baseline 0-128   ? Time 10   ? Period Weeks   ? Status Partially Met   ? Target Date 10/09/21   ? ?  ?  ? ?  ? ? ? Plan - 09/14/21 1551   ? ? Clinical Impression Statement Patient progressing well overall. Showing good AROM but continues to have difficulty with descending stairs and was challenge with 4 inch step downs due to decreased quad strength. Added weighted TKEs for activation. Patient will continue to benefit from skilled therapy services to reduce remaining deficits and improve functional ability.  ?  ? Examination-Activity Limitations  Stand;Stairs;Squat;Transfers;Locomotion Level   ? Examination-Participation Restrictions Occupation;Yard Work;Community Activity;Cleaning   ? Stability/Clinical Decision Making Stable/Uncomplicated   ? Clinical Decision Making Low   ? Re

## 2021-10-02 ENCOUNTER — Ambulatory Visit: Payer: 59 | Admitting: Family Medicine

## 2021-10-03 ENCOUNTER — Encounter (HOSPITAL_COMMUNITY): Payer: 59 | Admitting: Physical Therapy

## 2021-10-04 ENCOUNTER — Other Ambulatory Visit (HOSPITAL_COMMUNITY): Payer: Self-pay

## 2021-10-04 DIAGNOSIS — J301 Allergic rhinitis due to pollen: Secondary | ICD-10-CM | POA: Diagnosis not present

## 2021-10-04 DIAGNOSIS — I10 Essential (primary) hypertension: Secondary | ICD-10-CM | POA: Diagnosis not present

## 2021-10-04 DIAGNOSIS — G43909 Migraine, unspecified, not intractable, without status migrainosus: Secondary | ICD-10-CM | POA: Diagnosis not present

## 2021-10-04 DIAGNOSIS — Z6825 Body mass index (BMI) 25.0-25.9, adult: Secondary | ICD-10-CM | POA: Diagnosis not present

## 2021-10-04 DIAGNOSIS — E559 Vitamin D deficiency, unspecified: Secondary | ICD-10-CM | POA: Diagnosis not present

## 2021-10-04 DIAGNOSIS — E7849 Other hyperlipidemia: Secondary | ICD-10-CM | POA: Diagnosis not present

## 2021-10-04 MED ORDER — HYDRALAZINE HCL 25 MG PO TABS
ORAL_TABLET | ORAL | 3 refills | Status: DC
  Filled 2021-10-04: qty 180, 90d supply, fill #0
  Filled 2022-01-22: qty 180, 90d supply, fill #1
  Filled 2022-04-29: qty 180, 90d supply, fill #2
  Filled 2022-07-08 – 2022-07-11 (×2): qty 180, 90d supply, fill #3

## 2021-10-04 MED ORDER — PANTOPRAZOLE SODIUM 40 MG PO TBEC
DELAYED_RELEASE_TABLET | ORAL | 1 refills | Status: DC
  Filled 2021-10-04: qty 90, 90d supply, fill #0
  Filled 2022-01-22: qty 90, 90d supply, fill #1

## 2021-10-04 MED ORDER — FLUTICASONE PROPIONATE 50 MCG/ACT NA SUSP
NASAL | 3 refills | Status: DC
  Filled 2021-10-04: qty 48, 90d supply, fill #0

## 2021-10-04 MED ORDER — MONTELUKAST SODIUM 10 MG PO TABS
ORAL_TABLET | ORAL | 3 refills | Status: DC
  Filled 2021-10-04: qty 90, 90d supply, fill #0
  Filled 2022-01-22: qty 90, 90d supply, fill #1
  Filled 2022-04-22: qty 90, 90d supply, fill #2
  Filled 2022-07-08: qty 90, 90d supply, fill #3

## 2021-10-04 MED ORDER — CETIRIZINE HCL 10 MG PO TABS
ORAL_TABLET | ORAL | 1 refills | Status: DC
  Filled 2021-10-04: qty 90, 90d supply, fill #0
  Filled 2022-02-05: qty 90, 90d supply, fill #1

## 2021-10-04 MED ORDER — POTASSIUM CHLORIDE CRYS ER 20 MEQ PO TBCR
EXTENDED_RELEASE_TABLET | ORAL | 3 refills | Status: DC
  Filled 2021-10-04: qty 90, 90d supply, fill #0

## 2021-10-04 MED ORDER — POLYETHYLENE GLYCOL 3350 17 GM/SCOOP PO POWD
ORAL | 6 refills | Status: DC
  Filled 2021-10-04: qty 952, 28d supply, fill #0

## 2021-10-05 ENCOUNTER — Other Ambulatory Visit (HOSPITAL_COMMUNITY): Payer: Self-pay

## 2021-10-05 ENCOUNTER — Ambulatory Visit (HOSPITAL_COMMUNITY): Payer: 59 | Attending: Orthopedic Surgery | Admitting: Physical Therapy

## 2021-10-05 DIAGNOSIS — M25662 Stiffness of left knee, not elsewhere classified: Secondary | ICD-10-CM

## 2021-10-05 DIAGNOSIS — M6281 Muscle weakness (generalized): Secondary | ICD-10-CM

## 2021-10-05 DIAGNOSIS — R2689 Other abnormalities of gait and mobility: Secondary | ICD-10-CM

## 2021-10-05 DIAGNOSIS — M25562 Pain in left knee: Secondary | ICD-10-CM | POA: Diagnosis not present

## 2021-10-05 NOTE — Therapy (Signed)
?OUTPATIENT PHYSICAL THERAPY TREATMENT NOTE ? ? ?Patient Name: Rose Delgado ?MRN: [id number] ?DOB:[birth year], 61 y.o., female ?Today's Date: 10/05/2021 ? ?PCP: Caryl Bis, MD ?REFERRING PROVIDER: Gloriann Loan Pa-C  ? ? PT End of Session - 10/05/21 1533   ? ? Visit Number 14   ? Number of Visits 20   ? Date for PT Re-Evaluation 10/16/21   ? Authorization Type [place] [employer]   ? Progress Note Due on Visit 17   ? PT Start Time 1535   ? PT Stop Time 1615   ? PT Time Calculation (min) 40 min   ? Activity Tolerance Patient tolerated treatment well   ? Behavior During Therapy [facility] for tasks assessed/performed   ? ?  ?  ? ?  ? ? ? ? ?Past Medical History:  ?Diagnosis Date  ? Allergic rhinitis   ? Angio-edema   ? Fibroids   ? uterine  ? GERD (gastroesophageal reflux disease)   ? Hypertension   ? Irregular heart beat   ? Migraines   ? Perimenopause 03/09/2014  ? Postmenopausal 2016  ? Urticaria   ? ?Past Surgical History:  ?Procedure Laterality Date  ? ANTERIOR CRUCIATE LIGAMENT REPAIR Left 07/18/2021  ? Procedure: LEFT KNEE ANTERIOR CRUCIATE LIGAMENT RECONSTRUCTION, HAMSTRING AUTOGRAFT, MENISCAL DEBRIDEMENT;  Surgeon: Meredith Pel, MD;  Location: [place];  Service: Orthopedics;  Laterality: Left;  ? BREAST BIOPSY Left 2008  ? benign  ? COLONOSCOPY  06/2011  ? Dr. Britta Mccreedy: per patient it was normal, follow up in 10 years.   ? ESOPHAGOGASTRODUODENOSCOPY    ? Dr. Berneta Sages: late 1990s/early 2000  ? ESOPHAGOGASTRODUODENOSCOPY N/A 05/19/2014  ? SLF:NO OBVIOUS SOURCE FOR DYSGEUSIA IDENTIFED/MILD Non-erosive gastritis  ? INCISION AND DRAINAGE Right 07/31/2017  ? Procedure: INCISION AND DRAINAGE RIGHT THUMB NAIL BED;  Surgeon: Charlotte Crumb, MD;  Location: [place];  Service: Orthopedics;  Laterality: Right;  ? INGUINAL HERNIA REPAIR    ? KNEE SURGERY Right   ? NAILBED REPAIR Right 07/31/2017  ? Procedure: NAILBED BIOPSY;  Surgeon: Charlotte Crumb, MD;  Location: [place];  Service:  Orthopedics;  Laterality: Right;  ? TONSILLECTOMY AND ADENOIDECTOMY    ? ?Patient Active Problem List  ? Diagnosis Date Noted  ? Left anterior cruciate ligament tear   ? Acute medial meniscal tear, left, subsequent encounter   ? Bilateral tennis elbow 06/01/2020  ? PMB (postmenopausal bleeding) 01/07/2019  ? Encounter for gynecological examination with Papanicolaou smear of cervix 01/07/2019  ? Screening for colorectal cancer 01/07/2019  ? Seasonal and perennial allergic rhinitis 08/27/2018  ? 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  ? Pain in finger of right hand 06/27/2017  ? Menorrhagia 05/28/2016  ? Left hand pain 02/16/2016  ? 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 Nausea with vomiting 07/14/2014  ? Dysgeusia 03/11/2014  ? GERD (gastroesophageal reflux disease) 03/11/2014  ? Hot flashes 03/09/2014  ? Perimenopause 03/09/2014  ? Fibroids 03/09/2014  ? ? ?REFERRING DIAG: LT ACL reconstruction with hamstring autograft ? ?THERAPY DIAG:  ?Stiffness of left knee, not elsewhere classified ? ?Other abnormalities of gait and mobility ? ?Muscle weakness (generalized) ? ?Left knee pain, unspecified chronicity ? ?PERTINENT HISTORY: LT ACL reconstruction with hamstring autograft (07/18/21) ? ?PRECAUTIONS: Knee   ACL protocol  ? ?SUBJECTIVE:  pt states she had a physical with her primary and feels she may have CRPS (complex regional pain syndrome). States she returns to her orthopedist toward end  of this month.  States she's having a lot of pain today even after taking tylenol at lunch.  States it was 9/10 until taking meds and now 6/10. ? ?PAIN:  ?Are you having pain? Yes: NPRS scale: 6/10 ?Pain location: Lt knee ?Pain description: Tender and hypersensitive ?Aggravating factors: stairs ?Relieving factors: ice, compression, rest ? ? ? ? ?TODAY'S TREATMENT:  ? ?10/05/21 ?Recumbent bike 5 min AROM lv 3  ?Calf stretch 3 x 30" ?Heel raise x 20 ?Step ups 6 inch 2 x 10 ?Lateral step ups 6"  2X10 ?Step downs 4 inch HHA x 1 2 x 10 ?7" reciprocally 5RT with 1 HR ?Cybex leg curl 3 plates 2 x 10 ?TKE 3 plates 2 E56 ? ? ?09/13/95 ?Recumbent bike 5 min AROM lv 3  ?Calf stretch 3 x 30" ?Heel raise x 20 ?Band sidestepping GTB 3 RT ?Monster walks GTB 3RT  ?Step ups 6 inch 2 x 10  ?Step downs 4 inch HHA x 1 2 x 10 ?Cybex leg curl 3 plates 2 x 10 ?TKE 3 plates 2 W26 ?SLS on foam 3 x 30" ? ? ?09/21/21 ? Slant board gastroc stretch 3X30" ? Step up fwd 6 inch 2 x 10 Lt leading 1 UE assist ? Step up lateral 6 inch 15X Lt on step 2 UE assist ?Step downs 6 inch X15 eccentric focus with 1 UE assist ?7" stair negotiation with 1 HR 5RT reciprocally ?Vector stance on blue foam with 1 HHA 5X5" each LE ?squats to standard chair no UE's 10X  ?Weighted walkouts retro 4 plates x10  ? ?3/78/58 ? Slant board gastroc stretch 3X30" ? Step up fwd 6 inch 2 x 10 Lt leading 1 UE assist ? Step up lateral 6 inch 15X Lt on step 2 UE assist ?Step downs 6 inch X15 eccentric focus with 1 UE assist ?7" stair negotiation with 1 HR 5RT reciprocally ?Standing hip abduction GTB 2 x10 ?Standing hip extension GTB 2 x 10 ?Single leg stance  on foam 3 x 30") ?Weighted walkouts retro 4 plates x10  ? ?Other:  TENS instruction/placement for Lt knee ? ? ? ?PATIENT EDUCATION ?Trial of compression stockings to see if helps with nerve irritation, continue with self touch/massage to also reduce hypersensitivity. Continue with HEP; verbalized understanding. ? ?HOME EXERCISE PROGRAM: ?Access Code: [email or web address] ?URL: [email or web address] ?Date: 09/19/2021 ?Prepared by: Josue Hector ? ?Exercises ?Standing Terminal Knee Extension with Resistance - 2-3 x daily - 7 x weekly - 2 sets - 10 reps ?Single Leg Stance on Pillow - 2-3 x daily - 7 x weekly - 1 sets - 3 reps - 30 second hold ?quad set, SLR, AAROM heel slide, glute set and ankle pump   ? ? PT Short Term Goals - 09/06/21 0856   ? ?  ? PT SHORT TERM GOAL #1  ? Title Patient will be independent with  initial HEP and self-management strategies to improve functional outcomes   ? Baseline Reports compliance   ? Time 4   ? Period Weeks   ? Status Achieved   ? Target Date 09/04/21   ?  ? PT SHORT TERM GOAL #2  ? Title Patient will have equal to or > 4/5 MMT throughout LLE to improve ability to perform functional mobility, stair ambulation and ADLs.   ? Baseline See MMT   ? Time 4   ? Period Weeks   ? Status Achieved   ? Target Date 09/04/21   ?  ? PT SHORT  TERM GOAL #3  ? Title Patient will have LT knee AROM 0-110 degrees to improve functional mobility and facilitate squatting to pick up items from floor.   ? Baseline 0-128   ? Time 4   ? Period Weeks   ? Status Achieved   ? Target Date 09/04/21   ? ?  ?  ? ?  ? ? ? PT Long Term Goals - 09/06/21 0856   ? ?  ? PT LONG TERM GOAL #1  ? Title Patient will improve FOTO score to predicted value to indicate improvement in functional outcomes   ? Baseline See FOTO   ? Time 10   ? Period Weeks   ? Status Partially Met   ? Target Date 10/16/21   ?  ? PT LONG TERM GOAL #2  ? Title Patient will be independent with advance HEP and self-management strategies to improve functional outcomes   ? Time 10   ? Period Weeks   ? Status On-going   ? Target Date 10/09/21   ?  ? PT LONG TERM GOAL #3  ? Title Patient will report at least 85% overall improvement in subjective complaint to indicate improvement in ability to perform ADLs.   ? Baseline 80-85% improved   ? Time 10   ? Period Weeks   ? Status Partially Met   ? Target Date 10/09/21   ?  ? PT LONG TERM GOAL #4  ? Title Patient will have equal to or > 4+/5 MMT throughout BLE to improve ability to perform functional mobility, stair ambulation and ADLs.   ? Baseline See MMT   ? Time 10   ? Period Weeks   ? Status Partially Met   ? Target Date 10/09/21   ?  ? PT LONG TERM GOAL #5  ? Title Patient will have LT knee AROM 0-130 degrees to improve functional mobility and facilitate squatting to pick up items from floor.   ? Baseline 0-128    ? Time 10   ? Period Weeks   ? Status Partially Met   ? Target Date 10/09/21   ? ?  ?  ? ?  ? ? ? Plan - 09/14/21 1551   ? ? Clinical Impression Statement Patient request to hold off on adding new exer

## 2021-10-06 ENCOUNTER — Other Ambulatory Visit (HOSPITAL_COMMUNITY): Payer: Self-pay

## 2021-10-09 ENCOUNTER — Other Ambulatory Visit (HOSPITAL_COMMUNITY): Payer: Self-pay

## 2021-10-10 ENCOUNTER — Ambulatory Visit (HOSPITAL_COMMUNITY): Payer: 59 | Admitting: Physical Therapy

## 2021-10-10 ENCOUNTER — Encounter (HOSPITAL_COMMUNITY): Payer: Self-pay | Admitting: Physical Therapy

## 2021-10-10 DIAGNOSIS — M6281 Muscle weakness (generalized): Secondary | ICD-10-CM | POA: Diagnosis not present

## 2021-10-10 DIAGNOSIS — M25662 Stiffness of left knee, not elsewhere classified: Secondary | ICD-10-CM

## 2021-10-10 DIAGNOSIS — M25562 Pain in left knee: Secondary | ICD-10-CM

## 2021-10-10 DIAGNOSIS — R2689 Other abnormalities of gait and mobility: Secondary | ICD-10-CM | POA: Diagnosis not present

## 2021-10-10 NOTE — Therapy (Signed)
?OUTPATIENT PHYSICAL THERAPY TREATMENT NOTE ? ? ?Patient Name: Rose Delgado ?MRN: [id number] ?DOB:19-Dec-1960, 61 y.o., female ?Today's Date: 10/10/2021 ? ?PCP: Caryl Bis, MD ?REFERRING PROVIDER: Gloriann Loan Pa-C  ? ? PT End of Session - 10/10/21 1536   ? ? Visit Number 15   ? Number of Visits 20   ? Date for PT Re-Evaluation 10/16/21   ? Authorization Type [place] [facility]   ? Progress Note Due on Visit 17   ? PT Start Time 1536   ? PT Stop Time 1615   ? PT Time Calculation (min) 39 min   ? Activity Tolerance Patient tolerated treatment well   ? Behavior During Therapy [facility] for tasks assessed/performed   ? ?  ?  ? ?  ? ? ? ? ?Past Medical History:  ?Diagnosis Date  ? Allergic rhinitis   ? Angio-edema   ? Fibroids   ? uterine  ? GERD (gastroesophageal reflux disease)   ? Hypertension   ? Irregular heart beat   ? Migraines   ? Perimenopause 03/09/2014  ? Postmenopausal 2016  ? Urticaria   ? ?Past Surgical History:  ?Procedure Laterality Date  ? ANTERIOR CRUCIATE LIGAMENT REPAIR Left 07/18/2021  ? Procedure: LEFT KNEE ANTERIOR CRUCIATE LIGAMENT RECONSTRUCTION, HAMSTRING AUTOGRAFT, MENISCAL DEBRIDEMENT;  Surgeon: Meredith Pel, MD;  Location: [place];  Service: Orthopedics;  Laterality: Left;  ? BREAST BIOPSY Left 2008  ? benign  ? COLONOSCOPY  06/2011  ? Dr. Britta Mccreedy: per patient it was normal, follow up in 10 years.   ? ESOPHAGOGASTRODUODENOSCOPY    ? Dr. Berneta Sages: late 1990s/early 2000  ? ESOPHAGOGASTRODUODENOSCOPY N/A 05/19/2014  ? SLF:NO OBVIOUS SOURCE FOR DYSGEUSIA IDENTIFED/MILD Non-erosive gastritis  ? INCISION AND DRAINAGE Right 07/31/2017  ? Procedure: INCISION AND DRAINAGE RIGHT THUMB NAIL BED;  Surgeon: Charlotte Crumb, MD;  Location: [place];  Service: Orthopedics;  Laterality: Right;  ? INGUINAL HERNIA REPAIR    ? KNEE SURGERY Right   ? NAILBED REPAIR Right 07/31/2017  ? Procedure: NAILBED BIOPSY;  Surgeon: Charlotte Crumb, MD;  Location: [place];  Service:  Orthopedics;  Laterality: Right;  ? TONSILLECTOMY AND ADENOIDECTOMY    ? ?Patient Active Problem List  ? Diagnosis Date Noted  ? Left anterior cruciate ligament tear   ? Acute medial meniscal tear, left, subsequent encounter   ? Bilateral tennis elbow 06/01/2020  ? PMB (postmenopausal bleeding) 01/07/2019  ? Encounter for gynecological examination with Papanicolaou smear of cervix 01/07/2019  ? Screening for colorectal cancer 01/07/2019  ? Seasonal and perennial allergic rhinitis 08/27/2018  ? 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  ? Pain in finger of right hand 06/27/2017  ? Menorrhagia 05/28/2016  ? Left hand pain 02/16/2016  ? 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 Nausea with vomiting 07/14/2014  ? Dysgeusia 03/11/2014  ? GERD (gastroesophageal reflux disease) 03/11/2014  ? Hot flashes 03/09/2014  ? Perimenopause 03/09/2014  ? Fibroids 03/09/2014  ? ? ?REFERRING DIAG: LT ACL reconstruction with hamstring autograft ? ?THERAPY DIAG:  ?Stiffness of left knee, not elsewhere classified ? ?Other abnormalities of gait and mobility ? ?Muscle weakness (generalized) ? ?Left knee pain, unspecified chronicity ? ?PERTINENT HISTORY: LT ACL reconstruction with hamstring autograft (07/18/21) ? ?PRECAUTIONS: Knee   ACL protocol  ? ?SUBJECTIVE:  pt states she she is still having a lot of pain.  Defers trying the elliptical today.  STates her pain varies but currently 3/10. ? ?PAIN:  ?  Are you having pain? Yes: NPRS scale: 3/10 ?Pain location: Lt knee ?Pain description: Tender and hypersensitive ?Aggravating factors: stairs ?Relieving factors: ice, compression, rest ? ? ? ? ?TODAY'S TREATMENT:  ? ?10/10/21 ?Recumbent bike 5 min AROM lv 3  ?Calf stretch 3 x 30" slant board ?Heel raise x 20 ?Step ups 6 inch 2 x 10 ?Lateral step ups 6" 2X10 eccentric control ?Step downs 6 inch HHA x 1 2 x 10 eccentric control ?7" reciprocally 5RT with 0 HR ?Cybex leg curl 3PL 2 x 10 ?Leg press 3PL  2X10 ?Weighted walkouts retro 4 plates x10 ? ?11/06/30 ?Recumbent bike 5 min AROM lv 3  ?Calf stretch 3 x 30" ?Heel raise x 20 ?Step ups 6 inch 2 x 10 ?Lateral step ups 6" 2X10 ?Step downs 4 inch HHA x 1 2 x 10 ?7" reciprocally 5RT with 1 HR ?Cybex leg curl 3 plates 2 x 10 ?TKE 3 plates 2 K02 ? ? ?5/42/70 ?Recumbent bike 5 min AROM lv 3  ?Calf stretch 3 x 30" ?Heel raise x 20 ?Band sidestepping GTB 3 RT ?Monster walks GTB 3RT  ?Step ups 6 inch 2 x 10  ?Step downs 4 inch HHA x 1 2 x 10 ?Cybex leg curl 3 plates 2 x 10 ?TKE 3 plates 2 W23 ?SLS on foam 3 x 30" ? ? ?09/21/21 ? Slant board gastroc stretch 3X30" ? Step up fwd 6 inch 2 x 10 Lt leading 1 UE assist ? Step up lateral 6 inch 15X Lt on step 2 UE assist ?Step downs 6 inch X15 eccentric focus with 1 UE assist ?7" stair negotiation with 1 HR 5RT reciprocally ?Vector stance on blue foam with 1 HHA 5X5" each LE ?squats to standard chair no UE's 10X  ?Weighted walkouts retro 4 plates x10  ? ?7/62/83 ? Slant board gastroc stretch 3X30" ? Step up fwd 6 inch 2 x 10 Lt leading 1 UE assist ? Step up lateral 6 inch 15X Lt on step 2 UE assist ?Step downs 6 inch X15 eccentric focus with 1 UE assist ?7" stair negotiation with 1 HR 5RT reciprocally ?Standing hip abduction GTB 2 x10 ?Standing hip extension GTB 2 x 10 ?Single leg stance  on foam 3 x 30") ?Weighted walkouts retro 4 plates x10  ? ?Other:  TENS instruction/placement for Lt knee ? ? ? ?PATIENT EDUCATION ?Need to purchase thigh high compression stockings rather than knee high; trial to see if helps with nerve irritation, continue with self touch/massage to also reduce hypersensitivity. Continue with HEP; verbalized understanding. ? ?HOME EXERCISE PROGRAM: ?Access Code: [email or web address] ?URL: [email or web address] ?Date: 09/19/2021 ?Prepared by: Josue Hector ? ?Exercises ?Standing Terminal Knee Extension with Resistance - 2-3 x daily - 7 x weekly - 2 sets - 10 reps ?Single Leg Stance on Pillow - 2-3 x daily - 7 x  weekly - 1 sets - 3 reps - 30 second hold ?quad set, SLR, AAROM heel slide, glute set and ankle pump   ? ? PT Short Term Goals - 09/06/21 0856   ? ?  ? PT SHORT TERM GOAL #1  ? Title Patient will be independent with initial HEP and self-management strategies to improve functional outcomes   ? Baseline Reports compliance   ? Time 4   ? Period Weeks   ? Status Achieved   ? Target Date 09/04/21   ?  ? PT SHORT TERM GOAL #2  ? Title Patient will have equal to or > 4/5 MMT throughout  LLE to improve ability to perform functional mobility, stair ambulation and ADLs.   ? Baseline See MMT   ? Time 4   ? Period Weeks   ? Status Achieved   ? Target Date 09/04/21   ?  ? PT SHORT TERM GOAL #3  ? Title Patient will have LT knee AROM 0-110 degrees to improve functional mobility and facilitate squatting to pick up items from floor.   ? Baseline 0-128   ? Time 4   ? Period Weeks   ? Status Achieved   ? Target Date 09/04/21   ? ?  ?  ? ?  ? ? ? PT Long Term Goals - 09/06/21 0856   ? ?  ? PT LONG TERM GOAL #1  ? Title Patient will improve FOTO score to predicted value to indicate improvement in functional outcomes   ? Baseline See FOTO   ? Time 10   ? Period Weeks   ? Status Partially Met   ? Target Date 10/16/21   ?  ? PT LONG TERM GOAL #2  ? Title Patient will be independent with advance HEP and self-management strategies to improve functional outcomes   ? Time 10   ? Period Weeks   ? Status On-going   ? Target Date 10/09/21   ?  ? PT LONG TERM GOAL #3  ? Title Patient will report at least 85% overall improvement in subjective complaint to indicate improvement in ability to perform ADLs.   ? Baseline 80-85% improved   ? Time 10   ? Period Weeks   ? Status Partially Met   ? Target Date 10/09/21   ?  ? PT LONG TERM GOAL #4  ? Title Patient will have equal to or > 4+/5 MMT throughout BLE to improve ability to perform functional mobility, stair ambulation and ADLs.   ? Baseline See MMT   ? Time 10   ? Period Weeks   ? Status Partially  Met   ? Target Date 10/09/21   ?  ? PT LONG TERM GOAL #5  ? Title Patient will have LT knee AROM 0-130 degrees to improve functional mobility and facilitate squatting to pick up items from floor.   ? Baseline

## 2021-10-12 ENCOUNTER — Ambulatory Visit (HOSPITAL_COMMUNITY): Payer: 59 | Admitting: Physical Therapy

## 2021-10-12 ENCOUNTER — Encounter (HOSPITAL_COMMUNITY): Payer: Self-pay | Admitting: Physical Therapy

## 2021-10-12 DIAGNOSIS — M25562 Pain in left knee: Secondary | ICD-10-CM

## 2021-10-12 DIAGNOSIS — M6281 Muscle weakness (generalized): Secondary | ICD-10-CM

## 2021-10-12 DIAGNOSIS — R2689 Other abnormalities of gait and mobility: Secondary | ICD-10-CM

## 2021-10-12 DIAGNOSIS — M25662 Stiffness of left knee, not elsewhere classified: Secondary | ICD-10-CM | POA: Diagnosis not present

## 2021-10-12 NOTE — Therapy (Signed)
?OUTPATIENT PHYSICAL THERAPY TREATMENT NOTE ? ? ?Patient Name: Rose Delgado ?MRN: [id number] ?DOB:02/02/1961, 61 y.o., female ?Today's Date: 10/12/2021 ?PHYSICAL THERAPY DISCHARGE SUMMARY ? ?Visits from Start of Care: 16 ? ?Current functional level related to goals / functional outcomes: ?See below ?  ?Remaining deficits: ?See below ?  ?Education / Equipment: ?See below  ? ?Patient agrees to discharge. Patient goals were partially met. Patient is being discharged due to maximized rehab potential.  ? ?PCP: Caryl Bis, MD ?REFERRING PROVIDER: Gloriann Loan Pa-C  ? ? PT End of Session - 10/12/21 1534   ? ? Visit Number 16   ? Number of Visits 20   ? Date for PT Re-Evaluation 10/16/21   ? Authorization Type [place] [facility]   ? Progress Note Due on Visit 17   ? PT Start Time 1520   ? PT Stop Time 4383   ? PT Time Calculation (min) 45 min   ? Activity Tolerance Patient tolerated treatment well   ? Behavior During Therapy [facility] for tasks assessed/performed   ? ?  ?  ? ?  ? ? ? ? ?Past Medical History:  ?Diagnosis Date  ? Allergic rhinitis   ? Angio-edema   ? Fibroids   ? uterine  ? GERD (gastroesophageal reflux disease)   ? Hypertension   ? Irregular heart beat   ? Migraines   ? Perimenopause 03/09/2014  ? Postmenopausal 2016  ? Urticaria   ? ?Past Surgical History:  ?Procedure Laterality Date  ? ANTERIOR CRUCIATE LIGAMENT REPAIR Left 07/18/2021  ? Procedure: LEFT KNEE ANTERIOR CRUCIATE LIGAMENT RECONSTRUCTION, HAMSTRING AUTOGRAFT, MENISCAL DEBRIDEMENT;  Surgeon: Meredith Pel, MD;  Location: [place];  Service: Orthopedics;  Laterality: Left;  ? BREAST BIOPSY Left 2008  ? benign  ? COLONOSCOPY  06/2011  ? Dr. Britta Mccreedy: per patient it was normal, follow up in 10 years.   ? ESOPHAGOGASTRODUODENOSCOPY    ? Dr. Berneta Sages: late 1990s/early 2000  ? ESOPHAGOGASTRODUODENOSCOPY N/A 05/19/2014  ? SLF:NO OBVIOUS SOURCE FOR DYSGEUSIA IDENTIFED/MILD Non-erosive gastritis  ? INCISION AND DRAINAGE Right 07/31/2017  ? Procedure:  INCISION AND DRAINAGE RIGHT THUMB NAIL BED;  Surgeon: Charlotte Crumb, MD;  Location: [place];  Service: Orthopedics;  Laterality: Right;  ? INGUINAL HERNIA REPAIR    ? KNEE SURGERY Right   ? NAILBED REPAIR Right 07/31/2017  ? Procedure: NAILBED BIOPSY;  Surgeon: Charlotte Crumb, MD;  Location: [place];  Service: Orthopedics;  Laterality: Right;  ? TONSILLECTOMY AND ADENOIDECTOMY    ? ?Patient Active Problem List  ? Diagnosis Date Noted  ? Left anterior cruciate ligament tear   ? Acute medial meniscal tear, left, subsequent encounter   ? Bilateral tennis elbow 06/01/2020  ? PMB (postmenopausal bleeding) 01/07/2019  ? Encounter for gynecological examination with Papanicolaou smear of cervix 01/07/2019  ? Screening for colorectal cancer 01/07/2019  ? Seasonal and perennial allergic rhinitis 08/27/2018  ? 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  ? Pain in finger of right hand 06/27/2017  ? Menorrhagia 05/28/2016  ? Left hand pain 02/16/2016  ? 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 Nausea with vomiting 07/14/2014  ? Dysgeusia 03/11/2014  ? GERD (gastroesophageal reflux disease) 03/11/2014  ? Hot flashes 03/09/2014  ? Perimenopause 03/09/2014  ? Fibroids 03/09/2014  ? ? ?REFERRING DIAG: LT ACL reconstruction with hamstring autograft ? ?THERAPY DIAG:  ?Stiffness of left knee, not elsewhere classified ? ?Other abnormalities of gait and mobility ? ?  Muscle weakness (generalized) ? ?Left knee pain, unspecified chronicity ? ?PERTINENT HISTORY: LT ACL reconstruction with hamstring autograft (07/18/21) ? ?PRECAUTIONS: Knee   ACL protocol  ? ?SUBJECTIVE: Patient reports 75% improvement since starting therapy. She is still bothered by leg pain, she may have suspected nerve injury and is following up with MD about this. She is functioning much better but notes ongoing difficulty with stairs.  ? ?PAIN:  ?Are you having pain? Yes: NPRS scale:  6/10 ?Pain location: Lt knee ?Pain description: Tender and hypersensitive ?Aggravating factors: stairs ?Relieving factors: ice, compression, rest ? ? ? ? ?TODAY'S TREATMENT:  ? ?10/12/21 ?Reassessment ? ?Observation/Other Assessments 09/06/21 10/12/21  ?  Focus on Therapeutic Outcomes (FOTO)  57% function 50% function   ?      ?  AROM   ?  Left Knee Extension 0  0  ?  Left Knee Flexion 128  130  ?      ?  Strength   ?  Right Hip Flexion 5/5  5  ?  Right Hip Extension 4+/5  4+  ?  Right Hip ABduction 4+/5  5  ?  Left Hip Flexion 5/5  5  ?  Left Hip Extension 4/5  4  ?  Left Hip ABduction 4/5  4+  ?  Right Knee Flexion 5/5  5  ?  Right Knee Extension 5/5  5  ?  Left Knee Flexion 4/5  4+ (pain)  ?  Left Knee Extension 4/5  4+ (compensates by lowering hip but knee remains extended) pain  ? ?Gait: WNL ?Balance: able to stand >20 sec in single limb stance on bilateral LE  ?Stairs: reciprocal 7 inch, no rails, slight early toe off on LLE, but good eccentric control otherwise ?Sensation: hypersensitive throughout LT anterior lower leg/ tibialis  ? ?10/10/21 ?Recumbent bike 5 min AROM lv 3  ?Calf stretch 3 x 30" slant board ?Heel raise x 20 ?Step ups 6 inch 2 x 10 ?Lateral step ups 6" 2X10 eccentric control ?Step downs 6 inch HHA x 1 2 x 10 eccentric control ?7" reciprocally 5RT with 0 HR ?Cybex leg curl 3PL 2 x 10 ?Leg press 3PL 2X10 ?Weighted walkouts retro 4 plates x10 ? ?12/01/81 ?Recumbent bike 5 min AROM lv 3  ?Calf stretch 3 x 30" ?Heel raise x 20 ?Step ups 6 inch 2 x 10 ?Lateral step ups 6" 2X10 ?Step downs 4 inch HHA x 1 2 x 10 ?7" reciprocally 5RT with 1 HR ?Cybex leg curl 3 plates 2 x 10 ?TKE 3 plates 2 T51 ? ? ?7/61/60 ?Recumbent bike 5 min AROM lv 3  ?Calf stretch 3 x 30" ?Heel raise x 20 ?Band sidestepping GTB 3 RT ?Monster walks GTB 3RT  ?Step ups 6 inch 2 x 10  ?Step downs 4 inch HHA x 1 2 x 10 ?Cybex leg curl 3 plates 2 x 10 ?TKE 3 plates 2 V37 ?SLS on foam 3 x 30" ? ? ?09/21/21 ? Slant board gastroc stretch  3X30" ? Step up fwd 6 inch 2 x 10 Lt leading 1 UE assist ? Step up lateral 6 inch 15X Lt on step 2 UE assist ?Step downs 6 inch X15 eccentric focus with 1 UE assist ?7" stair negotiation with 1 HR 5RT reciprocally ?Vector stance on blue foam with 1 HHA 5X5" each LE ?squats to standard chair no UE's 10X  ?Weighted walkouts retro 4 plates x10  ? ?07/07/24 ? Slant board gastroc stretch 3X30" ? Step  up fwd 6 inch 2 x 10 Lt leading 1 UE assist ? Step up lateral 6 inch 15X Lt on step 2 UE assist ?Step downs 6 inch X15 eccentric focus with 1 UE assist ?7" stair negotiation with 1 HR 5RT reciprocally ?Standing hip abduction GTB 2 x10 ?Standing hip extension GTB 2 x 10 ?Single leg stance  on foam 3 x 30") ?Weighted walkouts retro 4 plates x10  ? ?Other:  TENS instruction/placement for Lt knee ? ? ? ?PATIENT EDUCATION ?On progress to therapy goals, DC status, and alternative treatment options for nerve pain ? ?HOME EXERCISE PROGRAM: ?Access Code: [email or web address] ?URL: [email or web address] ?Date: 09/19/2021 ?Prepared by: Josue Hector ? ?Exercises ?Standing Terminal Knee Extension with Resistance - 2-3 x daily - 7 x weekly - 2 sets - 10 reps ?Single Leg Stance on Pillow - 2-3 x daily - 7 x weekly - 1 sets - 3 reps - 30 second hold ?quad set, SLR, AAROM heel slide, glute set and ankle pump   ? ? PT Short Term Goals - 09/06/21 0856   ? ?  ? PT SHORT TERM GOAL #1  ? Title Patient will be independent with initial HEP and self-management strategies to improve functional outcomes   ? Baseline Reports compliance   ? Time 4   ? Period Weeks   ? Status Achieved   ? Target Date 09/04/21   ?  ? PT SHORT TERM GOAL #2  ? Title Patient will have equal to or > 4/5 MMT throughout LLE to improve ability to perform functional mobility, stair ambulation and ADLs.   ? Baseline See MMT   ? Time 4   ? Period Weeks   ? Status Achieved   ? Target Date 09/04/21   ?  ? PT SHORT TERM GOAL #3  ? Title Patient will have LT knee AROM 0-110 degrees  to improve functional mobility and facilitate squatting to pick up items from floor.   ? Baseline 0-128   ? Time 4   ? Period Weeks   ? Status Achieved   ? Target Date 09/04/21   ? ?  ?  ? ?  ? ? ? PT Long Term Goals

## 2021-10-14 ENCOUNTER — Other Ambulatory Visit (HOSPITAL_COMMUNITY): Payer: Self-pay

## 2021-10-16 ENCOUNTER — Other Ambulatory Visit (HOSPITAL_COMMUNITY): Payer: Self-pay

## 2021-10-17 ENCOUNTER — Encounter (HOSPITAL_COMMUNITY): Payer: 59 | Admitting: Physical Therapy

## 2021-10-19 ENCOUNTER — Encounter (HOSPITAL_COMMUNITY): Payer: 59 | Admitting: Physical Therapy

## 2021-10-24 ENCOUNTER — Encounter (HOSPITAL_COMMUNITY): Payer: 59 | Admitting: Physical Therapy

## 2021-10-25 ENCOUNTER — Other Ambulatory Visit (HOSPITAL_COMMUNITY): Payer: Self-pay

## 2021-10-26 ENCOUNTER — Encounter (HOSPITAL_COMMUNITY): Payer: 59 | Admitting: Physical Therapy

## 2021-10-30 ENCOUNTER — Other Ambulatory Visit (HOSPITAL_COMMUNITY): Payer: Self-pay

## 2021-10-30 ENCOUNTER — Ambulatory Visit: Payer: 59 | Admitting: Family Medicine

## 2021-10-30 ENCOUNTER — Encounter: Payer: Self-pay | Admitting: Family Medicine

## 2021-10-30 VITALS — BP 120/80 | HR 67 | Temp 98.2°F | Resp 18 | Ht 63.39 in | Wt 149.0 lb

## 2021-10-30 DIAGNOSIS — T7800XD Anaphylactic reaction due to unspecified food, subsequent encounter: Secondary | ICD-10-CM

## 2021-10-30 DIAGNOSIS — H1013 Acute atopic conjunctivitis, bilateral: Secondary | ICD-10-CM

## 2021-10-30 DIAGNOSIS — H1010 Acute atopic conjunctivitis, unspecified eye: Secondary | ICD-10-CM

## 2021-10-30 DIAGNOSIS — J3089 Other allergic rhinitis: Secondary | ICD-10-CM

## 2021-10-30 DIAGNOSIS — J302 Other seasonal allergic rhinitis: Secondary | ICD-10-CM

## 2021-10-30 MED ORDER — AZELASTINE HCL 137 MCG/SPRAY NA SOLN
2.0000 | Freq: Two times a day (BID) | NASAL | 3 refills | Status: AC | PRN
  Filled 2021-10-30: qty 90, 90d supply, fill #0

## 2021-10-30 MED ORDER — EPINEPHRINE 0.3 MG/0.3ML IJ SOAJ
INTRAMUSCULAR | 2 refills | Status: AC
Start: 2021-10-30 — End: ?
  Filled 2021-10-30: qty 2, 30d supply, fill #0
  Filled 2022-02-05: qty 2, 30d supply, fill #1

## 2021-10-30 NOTE — Progress Notes (Signed)
? ?[address], [place] [address] ?Dept: [phone] ? ?FOLLOW UP NOTE ? ?Patient ID: Rose Delgado, female    DOB: 08-Sep-1960  Age: 61 y.o. MRN: [id number] ?Date of Office Visit: 10/30/2021 ? ?Assessment  ?Chief Complaint: Allergic Rhinitis  (Says she is doing well. Just watery eyes at times. ) and Food Intolerance (Still avoiding shellfish except shrimp. ) ? ?HPI ?Rose Delgado is a 61 year old female who presents to the clinic for follow-up visit.  She was last seen in this clinic on 11/11/2020 by Dr. 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  At today's visit, she reports her allergic rhinitis has been moderately well controlled with symptoms including occasional sneeze and some remaining postnasal drainage.  She continues Flonase daily, azelastine daily, montelukast 10 mg daily, and an antihistamine daily.  She is not currently using a nasal saline rinse.  She did try allergen immunotherapy, however, stopped due to large local reactions. She reports that her eyes are frequently red, however, she denies itching or drainage.  She has not used an allergy eyedrop on any previous occasion.  She continues to avoid shellfish with the exception of shrimp with no accidental ingestion or EpiPen use since her last visit to this clinic.  Her current medications are listed in the chart. ? ? ?Drug Allergies:  ?Allergies  ?Allergen Reactions  ? Metoprolol Anaphylaxis  ? Shellfish Allergy Anaphylaxis  ? Flagyl [Metronidazole] Nausea And Vomiting  ? Lactose Intolerance (Gi) Nausea And Vomiting  ?  And diarrhea  ? Losartan Swelling  ? Topamax [Topiramate] Other (See Comments)  ?  "feeling of doom"  ? Ciprofloxacin Hcl Nausea Only  ? Diflucan [Fluconazole] Nausea And Vomiting  ? Latex Rash  ? ? ?Physical Exam: ?BP 120/80   Pulse 67   Temp 98.2 ?F (36.8 ?C) (Temporal)   Resp 18   Ht 5' 3.39" (1.61 m)   Wt 149 lb (67.6 kg)   LMP 07/29/2017   SpO2 98%    BMI 26.07 kg/m?   ? ?Physical Exam ?Vitals reviewed.  ?Constitutional:   ?   Appearance: Normal appearance.  ?HENT:  ?   Head: Normocephalic and atraumatic.  ?   Right Ear: Tympanic membrane normal.  ?   Left Ear: Tympanic membrane normal.  ?   Nose:  ?   Comments: Bilateral naris edematous and pale with clear nasal drainage noted.  Pharynx normal.  Ears normal.  Eyes normal. ?   Mouth/Throat:  ?   Pharynx: Oropharynx is clear.  ?Eyes:  ?   Conjunctiva/sclera: Conjunctivae normal.  ?Cardiovascular:  ?   Rate and Rhythm: Normal rate and regular rhythm.  ?   Heart sounds: Normal heart sounds. No murmur heard. ?Pulmonary:  ?   Effort: Pulmonary effort is normal.  ?   Breath sounds: Normal breath sounds.  ?   Comments: Lungs clear to auscultation ?Musculoskeletal:     ?   General: Normal range of motion.  ?   Cervical back: Normal range of motion and neck supple.  ?Skin: ?   General: Skin is warm and dry.  ?Neurological:  ?   Mental Status: She is alert and oriented to person, place, and time.  ?Psychiatric:     ?   Mood and Affect: Mood normal.     ?   Behavior: Behavior normal.     ?   Thought Content: Thought content normal.     ?  Judgment: Judgment normal.  ? ? ?Assessment and Plan: ?1. Seasonal and perennial allergic rhinitis   ?2. 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 Seasonal allergic conjunctivitis   ? ? ?Meds ordered this encounter  ?Medications  ? EPINEPHrine 0.3 mg/0.3 mL IJ SOAJ injection  ?  Sig: Use as directed for severe allergic reaction.  ?  Dispense:  2 each  ?  Refill:  2  ?  Generic mylan brand  ? Azelastine HCl 137 MCG/SPRAY SOLN  ?  Sig: Place 2 sprays into the nose 2 (two) times daily as needed.  ?  Dispense:  90 mL  ?  Refill:  3  ? ? ?Patient Instructions  ?Allergic rhinitis ?Continue allergen avoidance measures directed toward pollens, pets, mold, dust mite, and cockroach ?Continue cetirizine 10 mg once a day if needed for runny nose or itch ?Continue azelastine 2  sprays in each nostril up to twice a day as needed for runny nose ?Continue Flonase 2 sprays in each nostril once a day as needed for stuffy nose. In the right nostril, point the applicator out toward the right ear. In the left nostril, point the applicator out toward the left ear ?Consider saline nasal rinses as needed for nasal symptoms. Use this before any medicated nasal sprays for best result ? ?Food allergy ?Continue to avoid shellfish with the exception of shrimp. In case of an allergic reaction, take Benadryl 50 mg every 4 hours, and if life-threatening symptoms occur, inject with EpiPen 0.3 mg. ? ?Call the clinic if this treatment plan is not working well for you. ? ?Follow up in 1 year or sooner if needed. ? ? ?No follow-ups on file. ?  ? ?Thank you for the opportunity to care for this patient.  Please do not hesitate to contact me with questions. ? ?Gareth Morgan, FNP ?[facility] ? ? ? ? ? ?

## 2021-10-30 NOTE — Patient Instructions (Signed)
Allergic rhinitis ?Continue allergen avoidance measures directed toward pollens, pets, mold, dust mite, and cockroach ?Continue cetirizine 10 mg once a day if needed for runny nose or itch ?Continue azelastine 2 sprays in each nostril up to twice a day as needed for runny nose ?Continue Flonase 2 sprays in each nostril once a day as needed for stuffy nose. In the right nostril, point the applicator out toward the right ear. In the left nostril, point the applicator out toward the left ear ?Consider saline nasal rinses as needed for nasal symptoms. Use this before any medicated nasal sprays for best result ? ?Food allergy ?Continue to avoid shellfish with the exception of shrimp. In case of an allergic reaction, take Benadryl 50 mg every 4 hours, and if life-threatening symptoms occur, inject with EpiPen 0.3 mg. ? ?Call the clinic if this treatment plan is not working well for you. ? ?Follow up in 1 year or sooner if needed. ? ?Reducing Pollen Exposure ?The American Academy of Allergy, Asthma and Immunology suggests the following steps to reduce your exposure to pollen during allergy seasons. ?Do not hang sheets or clothing out to dry; pollen may collect on these items. ?Do not mow lawns or spend time around freshly cut grass; mowing stirs up pollen. ?Keep windows closed at night.  Keep car windows closed while driving. ?Minimize morning activities outdoors, a time when pollen counts are usually at their highest. ?Stay indoors as much as possible when pollen counts or humidity is high and on windy days when pollen tends to remain in the air longer. ?Use air conditioning when possible.  Many air conditioners have filters that trap the pollen spores. ?Use a HEPA room air filter to remove pollen form the indoor air you breathe. ? ?Control of Mold Allergen ?Mold and fungi can grow on a variety of surfaces provided certain temperature and moisture conditions exist.  Outdoor molds grow on plants, decaying vegetation and  soil.  The major outdoor mold, Alternaria and Cladosporium, are found in very high numbers during hot and dry conditions.  Generally, a late Summer - Fall peak is seen for common outdoor fungal spores.  Rain will temporarily lower outdoor mold spore count, but counts rise rapidly when the rainy period ends.  The most important indoor molds are Aspergillus and Penicillium.  Dark, humid and poorly ventilated basements are ideal sites for mold growth.  The next most common sites of mold growth are the bathroom and the kitchen. ? ?Outdoor [employer] ?Use air conditioning and keep windows closed ?Avoid exposure to decaying vegetation. ?Avoid leaf raking. ?Avoid grain handling. ?Consider wearing a face mask if working in moldy areas. ? ?Indoor Mold Control ?Maintain humidity below 50%. ?Clean washable surfaces with 5% bleach solution. ?Remove sources e.g. Contaminated carpets. ? ? ?Control of Dust Mite Allergen ?Dust mites play a major role in allergic asthma and rhinitis. They occur in environments with high humidity wherever human skin is found. Dust mites absorb humidity from the atmosphere (ie, they do not drink) and feed on organic matter (including shed human and animal skin). Dust mites are a microscopic type of insect that you cannot see with the naked eye. High levels of dust mites have been detected from mattresses, pillows, carpets, upholstered furniture, bed covers, clothes, soft toys and any woven material. The principal allergen of the dust mite is found in its feces. A gram of dust may contain 1,000 mites and 250,000 fecal particles. Mite antigen is easily measured in the air  during house cleaning activities. Dust mites do not bite and do not cause harm to humans, other than by triggering allergies/asthma. ? ?Ways to decrease your exposure to dust mites in your home: ? ?1. Encase mattresses, box springs and pillows with a mite-impermeable barrier or cover ? ?2. Wash sheets, blankets and drapes weekly in  hot water (130? F) with detergent and dry them in a dryer on the hot setting. ? ?3. Have the room cleaned frequently with a vacuum cleaner and a damp dust-mop. For carpeting or rugs, vacuuming with a vacuum cleaner equipped with a high-efficiency particulate air (HEPA) filter. The dust mite allergic individual should not be in a room which is being cleaned and should wait 1 hour after cleaning before going into the room. ? ?4. Do not sleep on upholstered furniture (eg, couches). ? ?5. If possible removing carpeting, upholstered furniture and drapery from the home is ideal. Horizontal blinds should be eliminated in the rooms where the person spends the most time (bedroom, study, television room). Washable vinyl, roller-type shades are optimal. ? ?6. Remove all non-washable stuffed toys from the bedroom. Wash stuffed toys weekly like sheets and blankets above. ? ?7. Reduce indoor humidity to less than 50%. Inexpensive humidity monitors can be purchased at most hardware stores. Do not use a humidifier as can make the problem worse and are not recommended. ? ?Control of Dog or Cat Allergen ?Avoidance is the best way to manage a dog or cat allergy. If you have a dog or cat and are allergic to dog or cats, consider removing the dog or cat from the home. ?If you have a dog or cat but don?t want to find it a new home, or if your family wants a pet even though someone in the household is allergic, here are some strategies that may help keep symptoms at bay: ? ?Keep the pet out of your bedroom and restrict it to only a few rooms. Be advised that keeping the dog or cat in only one room will not limit the allergens to that room. ?Don?t pet, hug or kiss the dog or cat; if you do, wash your hands with soap and water. ?High-efficiency particulate air (HEPA) cleaners run continuously in a bedroom or living room can reduce allergen levels over time. ?Regular use of a high-efficiency vacuum cleaner or a central vacuum can reduce  allergen levels. ?Giving your dog or cat a bath at least once a week can reduce airborne allergen. ? ?Control of Cockroach Allergen ?Cockroach allergen has been identified as an important cause of acute attacks of asthma, especially in urban settings.  There are fifty-five species of cockroach that exist in the [place], however only three, the [place], [place] species produce allergen that can affect patients with Asthma.  Allergens can be obtained from fecal particles, egg casings and secretions from cockroaches. ?   ?Remove food sources. ?Reduce access to water. ?Seal access and entry points. ?Spray runways with 0.5-1% Diazinon or Chlorpyrifos ?Blow boric acid power under stoves and refrigerator. ?Place bait stations (hydramethylnon) at feeding sites. ? ? ?

## 2021-10-31 ENCOUNTER — Other Ambulatory Visit (HOSPITAL_COMMUNITY): Payer: Self-pay

## 2021-10-31 ENCOUNTER — Encounter (HOSPITAL_COMMUNITY): Payer: 59

## 2021-11-01 ENCOUNTER — Encounter: Payer: Self-pay | Admitting: Orthopedic Surgery

## 2021-11-01 ENCOUNTER — Ambulatory Visit (INDEPENDENT_AMBULATORY_CARE_PROVIDER_SITE_OTHER): Payer: 59 | Admitting: Surgical

## 2021-11-01 DIAGNOSIS — S83512D Sprain of anterior cruciate ligament of left knee, subsequent encounter: Secondary | ICD-10-CM | POA: Diagnosis not present

## 2021-11-01 NOTE — Progress Notes (Signed)
? ?Post-Op Visit Note ?  ?Patient: Rose Delgado           ?Date of Birth: 1961-03-14           ?MRN: [id number] ?Visit Date: 11/01/2021 ?PCP: Caryl Bis, MD ? ? ?Assessment & Plan: ? ?Chief Complaint:  ?Chief Complaint  ?Patient presents with  ? Left Knee - Routine Post Op  ?   07/18/21 left knee ACL reconstruction, meniscal debridement, hamstring autograft ? ?  ? ?Visit Diagnoses:  ?1. Rupture of anterior cruciate ligament of left knee, subsequent encounter   ? ? ?Plan: Patient is a 61 year old female who presents s/p left knee anterior cruciate ligament reconstruction with hamstring autograft and meniscal debridement on 07/18/2021.  Doing well overall.  Does not have to take any medications for pain control.  She feels the leg feels heavy at times and swells after a long day at work but this is steadily improving.  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  No instability of the knee or mechanical symptoms.  Her biggest complaint is numbness, neuropathic pain, increased sensitivity in the saphenous nerve distribution along the medial aspect of the distal thigh, knee, calf.  This has not really had any improvement since her last visit about 2 months ago. ? ?On exam, patient has 0 degrees extension and 120 degrees of knee flexion.  She walks without any significant limp.  No effusion.  No calf tenderness.  Negative Homans' sign.  Excellent quad strength rated 5/5.  ACL graft is stable to Lachman exam and by anterior drawer. ? ?Plan is continue with home exercise program.  Okay for jogging in a straight line but recommended against cutting or pivoting.  Cautioned her against jogging as her main form of exercise due to the meniscal debridement and recommended stationary bike.  Did inform her that the neuropathic pain should improve with time out from surgery but if it does not, could consider nerve study for further evaluation.  69-monthreturn for  clinical recheck ? ?Follow-Up Instructions: No follow-ups on file.  ? ?Orders:  ?No orders of the defined types were placed in this encounter. ? ?No orders of the defined types were placed in this encounter. ? ? ?Imaging: ?No results found. ? ?PMFS History: ?Patient Active Problem List  ? Diagnosis Date Noted  ? Left anterior cruciate ligament tear   ? Acute medial meniscal tear, left, subsequent encounter   ? Bilateral tennis elbow 06/01/2020  ? PMB (postmenopausal bleeding) 01/07/2019  ? Encounter for gynecological examination with Papanicolaou smear of cervix 01/07/2019  ? Screening for colorectal cancer 01/07/2019  ? Seasonal and perennial allergic rhinitis 08/27/2018  ? 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  ? Pain in finger of right hand 06/27/2017  ? Menorrhagia 05/28/2016  ? Left hand pain 02/16/2016  ? 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 Nausea with vomiting 07/14/2014  ? Dysgeusia 03/11/2014  ? GERD (gastroesophageal reflux disease) 03/11/2014  ? Hot flashes 03/09/2014  ? Perimenopause 03/09/2014  ? Fibroids 03/09/2014  ? ?Past Medical History:  ?Diagnosis Date  ? Allergic rhinitis   ? Angio-edema   ? Fibroids   ? uterine  ? GERD (gastroesophageal reflux disease)   ? Hypertension   ? Irregular heart beat   ? Migraines   ? Perimenopause 03/09/2014  ? Postmenopausal 2016  ? Urticaria   ?  ?Family History  ?Problem Relation Age  of Onset  ? Other Mother   ?     glaucoma; pacemaker  ? Hypertension Mother   ? Other Father   ?     aneursym  ? Cancer Sister   ?     biliary, deceased age 69  ? Hypertension Sister   ? Cancer Brother   ?     lung  ? Hypertension Brother   ? Sleep apnea Brother   ? Hypertension Brother   ? Hypertension Brother   ? Hypertension Sister   ? Hypertension Sister   ? Hypertension Sister   ? Colon cancer Neg Hx   ? Allergic rhinitis Neg Hx   ? Asthma Neg Hx   ?  ?Past Surgical History:  ?Procedure Laterality Date  ? ANTERIOR CRUCIATE LIGAMENT  REPAIR Left 07/18/2021  ? Procedure: LEFT KNEE ANTERIOR CRUCIATE LIGAMENT RECONSTRUCTION, HAMSTRING AUTOGRAFT, MENISCAL DEBRIDEMENT;  Surgeon: Meredith Pel, MD;  Location: [facility];  Service: Orthopedics;  Laterality: Left;  ? BREAST BIOPSY Left 2008  ? benign  ? COLONOSCOPY  06/2011  ? Dr. Britta Mccreedy: per patient it was normal, follow up in 10 years.   ? ESOPHAGOGASTRODUODENOSCOPY    ? Dr. Berneta Sages: late 1990s/early 2000  ? ESOPHAGOGASTRODUODENOSCOPY N/A 05/19/2014  ? SLF:NO OBVIOUS SOURCE FOR DYSGEUSIA IDENTIFED/MILD Non-erosive gastritis  ? INCISION AND DRAINAGE Right 07/31/2017  ? Procedure: INCISION AND DRAINAGE RIGHT THUMB NAIL BED;  Surgeon: Charlotte Crumb, MD;  Location: [facility];  Service: Orthopedics;  Laterality: Right;  ? INGUINAL HERNIA REPAIR    ? KNEE SURGERY Right   ? NAILBED REPAIR Right 07/31/2017  ? Procedure: NAILBED BIOPSY;  Surgeon: Charlotte Crumb, MD;  Location: [facility];  Service: Orthopedics;  Laterality: Right;  ? TONSILLECTOMY AND ADENOIDECTOMY    ? ?Social History  ? ?Occupational History  ? Occupation: Therapist, sports at [employer]  ?  Employer: [employer]  ?Tobacco Use  ? Smoking status: Never  ? Smokeless tobacco: Never  ?Vaping Use  ? Vaping Use: Never used  ?Substance and Sexual Activity  ? Alcohol use: No  ? Drug use: No  ? Sexual activity: Yes  ?  Birth control/protection: Post-menopausal, None  ? ? ? ?

## 2021-11-13 ENCOUNTER — Telehealth: Payer: Self-pay | Admitting: Cardiology

## 2021-11-13 DIAGNOSIS — Z760 Encounter for issue of repeat prescription: Secondary | ICD-10-CM | POA: Diagnosis not present

## 2021-11-13 NOTE — Telephone Encounter (Signed)
Pt states that her watch shook and alerted her that her HR was low. At that time it was 46. She reports that she has been really tired over the weekend and that her HR has been in the 40's and 50's. She does report taking her Diltiazem 300 mg daily. Pt has not taken any of her PRN Diltiazem since she has had it she states.  ?

## 2021-11-13 NOTE — Telephone Encounter (Signed)
STAT if HR is under 50 or over 120 ?(normal HR is 60-100 beats per minute) ? ?What is your heart rate?  ?61 ? ?Do you have a log of your heart rate readings (document readings)?  ? ?Do you have any other symptoms?  ?Just a little more tired than normal. Resting HR has been in the 40's-low 50's the past few days. ? ?

## 2021-11-14 ENCOUNTER — Other Ambulatory Visit (HOSPITAL_COMMUNITY): Payer: Self-pay

## 2021-11-14 MED ORDER — DILTIAZEM HCL ER COATED BEADS 240 MG PO CP24
240.0000 mg | ORAL_CAPSULE | Freq: Every day | ORAL | 3 refills | Status: DC
  Filled 2021-11-14: qty 90, 90d supply, fill #0
  Filled 2022-01-28: qty 90, 90d supply, fill #1
  Filled 2022-04-22: qty 90, 90d supply, fill #2
  Filled 2022-06-08 – 2022-07-17 (×3): qty 90, 90d supply, fill #3

## 2021-11-14 NOTE — Telephone Encounter (Signed)
Pt agreeable- verbalized understanding. Pt had no other questions or concerns at this time. ?

## 2021-11-14 NOTE — Telephone Encounter (Signed)
Can we lower her diltiazem to '240mg'$  daily please ? ?Zandra Abts MD ?

## 2021-11-15 ENCOUNTER — Ambulatory Visit: Payer: 59 | Admitting: Allergy & Immunology

## 2021-11-21 ENCOUNTER — Other Ambulatory Visit: Payer: Self-pay

## 2021-11-22 ENCOUNTER — Other Ambulatory Visit (HOSPITAL_COMMUNITY): Payer: Self-pay

## 2021-11-22 MED ORDER — POLYETHYLENE GLYCOL 3350 17 GM/SCOOP PO POWD: ORAL | 3 refills | Status: DC

## 2021-12-12 ENCOUNTER — Encounter: Payer: Self-pay | Admitting: Obstetrics & Gynecology

## 2021-12-12 ENCOUNTER — Ambulatory Visit (INDEPENDENT_AMBULATORY_CARE_PROVIDER_SITE_OTHER): Payer: 59 | Admitting: Obstetrics & Gynecology

## 2021-12-12 ENCOUNTER — Other Ambulatory Visit (HOSPITAL_COMMUNITY)
Admission: RE | Admit: 2021-12-12 | Discharge: 2021-12-12 | Disposition: A | Discharge status: Home / Self Care | Payer: 59 | Source: Ambulatory Visit | Attending: Obstetrics & Gynecology | Admitting: Obstetrics & Gynecology

## 2021-12-12 VITALS — BP 136/67 | HR 57 | Ht 64.0 in | Wt 151.0 lb

## 2021-12-12 DIAGNOSIS — Z01419 Encounter for gynecological examination (general) (routine) without abnormal findings: Secondary | ICD-10-CM | POA: Insufficient documentation

## 2021-12-12 DIAGNOSIS — Z1212 Encounter for screening for malignant neoplasm of rectum: Secondary | ICD-10-CM

## 2021-12-12 DIAGNOSIS — Z1211 Encounter for screening for malignant neoplasm of colon: Secondary | ICD-10-CM

## 2021-12-12 NOTE — Progress Notes (Signed)
Subjective:     Rose Delgado is a 61 y.o. female here for a routine exam.  Patient's last menstrual period was 07/29/2017. G0P0 Birth Control Method:  none Menstrual Calendar(currently): amenorrheic 3-4 years Current complaints: none.   Current acute medical issues:  none   Recent Gynecologic History Patient's last menstrual period was 07/29/2017. Last Pap: 2020,  normal Last mammogram: 06/30/21  normal  Past Medical History:  Diagnosis Date   Allergic rhinitis    Angio-edema    Fibroids    uterine   GERD (gastroesophageal reflux disease)    Hypertension    Irregular heart beat    Migraines    Perimenopause 03/09/2014   Postmenopausal 2016   Urticaria     Past Surgical History:  Procedure Laterality Date   ANTERIOR CRUCIATE LIGAMENT REPAIR Left 07/18/2021   Procedure: LEFT KNEE ANTERIOR CRUCIATE LIGAMENT RECONSTRUCTION, HAMSTRING AUTOGRAFT, MENISCAL DEBRIDEMENT;  Surgeon: Meredith Pel, MD;  Location: [facility];  Service: Orthopedics;  Laterality: Left;   BREAST BIOPSY Left 2008   benign   COLONOSCOPY  06/2011   Dr. Britta Mccreedy: per patient it was normal, follow up in 10 years.    ESOPHAGOGASTRODUODENOSCOPY     Dr. Berneta Sages: late 1990s/early 2000   ESOPHAGOGASTRODUODENOSCOPY N/A 05/19/2014   SLF:NO OBVIOUS SOURCE FOR DYSGEUSIA IDENTIFED/MILD Non-erosive gastritis   INCISION AND DRAINAGE Right 07/31/2017   Procedure: INCISION AND DRAINAGE RIGHT THUMB NAIL BED;  Surgeon: Charlotte Crumb, MD;  Location: [facility];  Service: Orthopedics;  Laterality: Right;   INGUINAL HERNIA REPAIR     KNEE SURGERY Right    NAILBED REPAIR Right 07/31/2017   Procedure: NAILBED BIOPSY;  Surgeon: Charlotte Crumb, MD;  Location: [facility];  Service: Orthopedics;  Laterality: Right;   TONSILLECTOMY AND ADENOIDECTOMY      OB History     Gravida  0   Para      Term      Preterm      AB      Living         SAB      IAB      Ectopic       Multiple      Live Births              Social History   Socioeconomic History   Marital status: Single    Spouse name: Not on file   Number of children: Not on file   Years of education: Not on file   Highest education level: Not on file  Occupational History   Occupation: Therapist, sports at [employer]    Employer: [employer]  Tobacco Use   Smoking status: Never   Smokeless tobacco: Never  Vaping Use   Vaping Use: Never used  Substance and Sexual Activity   Alcohol use: No   Drug use: No   Sexual activity: Yes    Birth control/protection: Post-menopausal, None  Other Topics Concern   Not on file  Social History Narrative   Not on file   Social Determinants of Health   Financial Resource Strain: Low Risk  (12/12/2021)   Overall Financial Resource Strain (CARDIA)    Difficulty of Paying Living Expenses: Not hard at all  Food Insecurity: No Food Insecurity (12/12/2021)   Hunger Vital Sign    Worried About Running Out of Food in the Last Year: Never true    Ran Out of Food in the Last Year: Never true  Transportation Needs: No  Transportation Needs (12/12/2021)   PRAPARE - Hydrologist (Medical): No    Lack of Transportation (Non-Medical): No  Physical Activity: Insufficiently Active (12/12/2021)   Exercise Vital Sign    Days of Exercise per Week: 3 days    Minutes of Exercise per Session: 30 min  Stress: No Stress Concern Present (12/12/2021)   [place]    Feeling of Stress : Not at all  Social Connections: Moderately Isolated (12/12/2021)   Social Connection and Isolation Panel [NHANES]    Frequency of Communication with Friends and Family: More than three times a week    Frequency of Social Gatherings with Friends and Family: More than three times a week    Attends Religious Services: More than 4 times per year    Active Member of [employer] or Organizations: No    Attends Programme researcher, broadcasting/film/video: Never    Marital Status: Divorced    Family History  Problem Relation Age of Onset   Other Father        aneursym   Other Mother        glaucoma; pacemaker   Hypertension Mother    Cancer Brother        lung   Hypertension Brother    Sleep apnea Brother    Hypertension Brother    Hypertension Brother    Cancer Sister        biliary, deceased age 83   Hypertension Sister    Hypertension Sister    Hypertension Sister    Hypertension Sister    Colon cancer Neg Hx    Allergic rhinitis Neg Hx    Asthma Neg Hx      Current Outpatient Medications:    Azelastine HCl 137 MCG/SPRAY SOLN, Place 2 sprays into the nose 2 (two) times daily as needed., Disp: 90 mL, Rfl: 3   Biotin 5 MG CAPS, Take 5 mg by mouth daily., Disp: , Rfl:    cetirizine (ZYRTEC) 10 MG tablet, Take 1 tablet by mouth every day, Disp: 90 tablet, Rfl: 1   cyclobenzaprine (FLEXERIL) 10 MG tablet, Take 1 tablet by mouth every 8 (eight) hours as needed., Disp: , Rfl: 0   diltiazem (CARDIZEM CD) 240 MG 24 hr capsule, Take 1 capsule by mouth daily., Disp: 90 capsule, Rfl: 3   EPINEPHrine 0.3 mg/0.3 mL IJ SOAJ injection, Use as directed for severe allergic reaction., Disp: 2 each, Rfl: 2   ergocalciferol (VITAMIN D2) 1.25 MG (50000 UT) capsule, Take 2 capsules by mouth once a week, Disp: 24 capsule, Rfl: 4   famotidine (PEPCID) 20 MG tablet, Take 1 tablet (20 mg total) by mouth 2 (two) times daily. (Patient taking differently: Take 20 mg by mouth daily.), Disp: 90 tablet, Rfl: 3   fluticasone (FLONASE) 50 MCG/ACT nasal spray, USE 2 SPRAY INTO EACH NOSTRIL EVERY DAY, Disp: 48 g, Rfl: 3   hydrALAZINE (APRESOLINE) 25 MG tablet, Take 1 tablet by mouth two times a day, Disp: 180 tablet, Rfl: 3   Magnesium 250 MG TABS, Take 250 mg by mouth daily., Disp: , Rfl:    methocarbamol (ROBAXIN) 500 MG tablet, Take 500 mg by mouth every 8 (eight) hours as needed for muscle spasms., Disp: , Rfl:    montelukast  (SINGULAIR) 10 MG tablet, Take 1 tablet by mouth every day, Disp: 90 tablet, Rfl: 3   Multiple Vitamins-Minerals (SM COMPLETE 50+) TABS, Take 1 tablet by mouth  daily., Disp: , Rfl:    ondansetron (ZOFRAN) 4 MG tablet, Take 1 tablet (4 mg total) by mouth every 4 (four) hours as needed for nausea or vomiting., Disp: 30 tablet, Rfl: 2   pantoprazole (PROTONIX) 40 MG tablet, TAKE 1 TABLET BY MOUTH DAILY FOR ACID REFLUX, Disp: 90 tablet, Rfl: 1   polyethylene glycol powder (CLEARLAX) 17 GM/SCOOP powder, DISSOLVE 17 GRAMS IN LIQUID AND DRINK BY MOUTH TWICE DAILY FOR CONSTIPATION, Disp: 1020 g, Rfl: 6   polyethylene glycol powder (CLEARLAX) 17 GM/SCOOP powder, DISSOLVE 17 GRAMS IN LIQUID AND DRINK BY MOUTH TWICE DAILY FOR CONSTIPATION, Disp: 1020 g, Rfl: 3   Potassium Chloride ER 20 MEQ TBCR, Take 1 tablet (20 mEq) by mouth daily., Disp: 90 tablet, Rfl: 3   Pyridoxine HCl (VITAMIN B6 PO), Take 1 tablet by mouth daily., Disp: , Rfl:    rizatriptan (MAXALT-MLT) 10 MG disintegrating tablet, Take 10 mg by mouth as needed for migraine. May repeat in 2 hours if needed, Disp: , Rfl:    diltiazem (CARDIZEM) 30 MG tablet, TAKE 1 TABLET BY MOUTH TWICE A DAY AS NEEDED FOR BREAKTHROUGH PALPITATIONS., Disp: 30 tablet, Rfl: 6  Review of Systems  Review of Systems  Constitutional: Negative for fever, chills, weight loss, malaise/fatigue and diaphoresis.  HENT: Negative for hearing loss, ear pain, nosebleeds, congestion, sore throat, neck pain, tinnitus and ear discharge.   Eyes: Negative for blurred vision, double vision, photophobia, pain, discharge and redness.  Respiratory: Negative for cough, hemoptysis, sputum production, shortness of breath, wheezing and stridor.   Cardiovascular: Negative for chest pain, palpitations, orthopnea, claudication, leg swelling and PND.  Gastrointestinal: negative for abdominal pain. Negative for heartburn, nausea, vomiting, diarrhea, constipation, blood in stool and melena.   Genitourinary: Negative for dysuria, urgency, frequency, hematuria and flank pain.  Musculoskeletal: Negative for myalgias, back pain, joint pain and falls.  Skin: Negative for itching and rash.  Neurological: Negative for dizziness, tingling, tremors, sensory change, speech change, focal weakness, seizures, loss of consciousness, weakness and headaches.  Endo/Heme/Allergies: Negative for environmental allergies and polydipsia. Does not bruise/bleed easily.  Psychiatric/Behavioral: Negative for depression, suicidal ideas, hallucinations, memory loss and substance abuse. The patient is not nervous/anxious and does not have insomnia.        Objective:  Blood pressure 136/67, pulse (!) 57, height '5\' 4"'$  (1.626 m), weight 151 lb (68.5 kg), last menstrual period 07/29/2017.   Physical Exam  Vitals reviewed. Constitutional: She is oriented to person, place, and time. She appears well-developed and well-nourished.  HENT:  Head: Normocephalic and atraumatic.        Right Ear: External ear normal.  Left Ear: External ear normal.  Nose: Nose normal.  Mouth/Throat: Oropharynx is clear and moist.  Eyes: Conjunctivae and EOM are normal. Pupils are equal, round, and reactive to light. Right eye exhibits no discharge. Left eye exhibits no discharge. No scleral icterus.  Neck: Normal range of motion. Neck supple. No tracheal deviation present. No thyromegaly present.  Cardiovascular: Normal rate, regular rhythm, normal heart sounds and intact distal pulses.  Exam reveals no gallop and no friction rub.   No murmur heard. Respiratory: Effort normal and breath sounds normal. No respiratory distress. She has no wheezes. She has no rales. She exhibits no tenderness.  GI: Soft. Bowel sounds are normal. She exhibits no distension and no mass. There is no tenderness. There is no rebound and no guarding.  Genitourinary:  Breasts no masses skin changes or nipple changes bilaterally  Vulva is normal without  lesions Vagina is pink moist without discharge Cervix normal in appearance and pap is done Uterus is normal size shape and contour Adnexa is negative with normal sized ovaries  {Rectal    hemoccult negative, normal tone, no masses  Musculoskeletal: Normal range of motion. She exhibits no edema and no tenderness.  Neurological: She is alert and oriented to person, place, and time. She has normal reflexes. She displays normal reflexes. No cranial nerve deficit. She exhibits normal muscle tone. Coordination normal.  Skin: Skin is warm and dry. No rash noted. No erythema. No pallor.  Psychiatric: She has a normal mood and affect. Her behavior is normal. Judgment and thought content normal.       Medications Ordered at today's visit: No orders of the defined types were placed in this encounter.   Other orders placed at today's visit: No orders of the defined types were placed in this encounter.     Assessment:    Normal Gyn exam.    Plan:    Contraception: post menopausal status. Follow up in: 3 years.     No follow-ups on file.

## 2021-12-13 ENCOUNTER — Encounter: Payer: Self-pay | Admitting: Surgical

## 2021-12-13 ENCOUNTER — Ambulatory Visit (INDEPENDENT_AMBULATORY_CARE_PROVIDER_SITE_OTHER): Payer: 59 | Admitting: Surgical

## 2021-12-13 ENCOUNTER — Ambulatory Visit: Payer: 59 | Admitting: Surgical

## 2021-12-13 DIAGNOSIS — R202 Paresthesia of skin: Secondary | ICD-10-CM

## 2021-12-13 DIAGNOSIS — R200 Anesthesia of skin: Secondary | ICD-10-CM

## 2021-12-13 DIAGNOSIS — S83512D Sprain of anterior cruciate ligament of left knee, subsequent encounter: Secondary | ICD-10-CM | POA: Diagnosis not present

## 2021-12-13 NOTE — Progress Notes (Signed)
Office Visit Note   Patient: Rose Delgado           Date of Birth: 1961-04-27           MRN: [id number] Visit Date: 12/13/2021 Requested by: Caryl Bis, MD [place],   [id number] PCP: Caryl Bis, MD  Subjective: Chief Complaint  Patient presents with   Left Knee - Follow-up    HPI: Rose Delgado is a 61 y.o. female who presents to the office complaining of left knee and left leg pain.  Patient has history of left knee anterior cruciate ligament reconstruction with hamstring autograft and meniscal debridement on 07/18/2021.  She states that her knee is doing well in regards to the stability and she has no instability of the knee.  She does complain of a tightness sensation in the anterior aspect of the proximal tibia.  She also complains of continued numbness and tingling as she has described at her last appointments that are primarily localized to the medial aspect of the leg between the distal thigh and the midportion of the shin.  She occasionally has extension of the tingling sensation to the anterior medial ankle and has noticed some occasional tingling in her toes.  She denies any low back pain aside from occasional chronic low back pain that she has dealt with for several years.  Pain and symptoms are worse with activity as well as swelling sensation.  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                ROS: All systems reviewed are negative as they relate to the chief complaint within the history of present illness.  Patient denies fevers or chills.  Assessment & Plan: Visit Diagnoses:  1. Rupture of anterior cruciate ligament of left knee, subsequent encounter   2. Numbness and tingling of left leg      Plan: Patient is a 61 year old female who presents for evaluation of continued numbness and tingling, pain in the left lower extremity following left knee ACL reconstruction with hamstring autograft in January 2023.  Doing very well in regards to stability of the knee and range of motion but her main concern is numbness and neuropathic pain.  Not really had much improvement in the symptoms in the last 3 months.  She had recent acute worsening of this pain after working in the emergency department.  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  She has history of MRI of the lumbar spine demonstrating mild disc bulging with no compressive pathology back in 2018.  Main differential diagnosis would be between referred pain from the lumbar spine versus some sort of peripheral neuropathy.  Plan for further evaluation of potential saphenous nerve irritation with nerve conduction study of the left lower extremity.  Would want to determine if the saphenous nerve is inflamed more proximally near the adductor canal or more distally near the infrapatellar branch.  Follow-up after nerve conduction study.  If this is negative, consider MRI lumbar spine.  Follow-Up Instructions: No follow-ups on file.   Orders:  No orders of  the defined types were placed in this encounter.  No orders of the defined types were placed in this encounter.     Procedures: No procedures performed   Clinical Data: No additional findings.  Objective: Vital Signs: LMP 07/29/2017   Physical Exam:  Constitutional: Patient appears well-developed HEENT:  Head: Normocephalic Eyes:EOM are normal Neck: Normal range of motion Cardiovascular: Normal rate Pulmonary/chest: Effort normal Neurologic: Patient is alert Skin: Skin is warm Psychiatric: Patient has normal mood and affect  Ortho Exam: Ortho exam demonstrates left knee with 0 degrees extension and about 120 degrees of  knee flexion.  Very slight amount of pain with terminal flexion.  She has no effusion.  Excellent stability to anterior and posterior drawer.  Excellent stability to Lachman exam.  No calf tenderness.  Negative Homans' sign.  She is able to perform straight leg raise.  Excellent strength of hip flexion, quadricep, hamstring, dorsiflexion, plantarflexion rated 5/5.  She has moderately decreased sensation and decreased differentiation between sharp and dull sensation in the medial distal thigh, medial calf, medial ankle.  No significantly decreased sensation in the right lower extremity or in the lateral aspect of the left lower extremity.  No decreased sensation in the dorsal or plantar aspects of the left foot.  Negative straight leg raise.  Specialty Comments:  No specialty comments available.  Imaging: No results found.   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   Nausea with vomiting 07/14/2014   Dysgeusia 03/11/2014   GERD (gastroesophageal reflux disease) 03/11/2014   Hot flashes 03/09/2014   Perimenopause 03/09/2014   Fibroids 03/09/2014   Past Medical History:  Diagnosis Date   Allergic rhinitis    Angio-edema    Fibroids    uterine   GERD (gastroesophageal reflux disease)    Hypertension    Irregular heart beat    Migraines    Perimenopause 03/09/2014   Postmenopausal  2016   Urticaria     Family History  Problem Relation Age of Onset   Other Father        aneursym   Other Mother        glaucoma; pacemaker   Hypertension Mother    Cancer Brother        lung   Hypertension Brother    Sleep apnea Brother    Hypertension Brother    Hypertension Brother    Cancer Sister        biliary, deceased age 24   Hypertension Sister    Hypertension Sister    Hypertension Sister    Hypertension Sister    Colon cancer Neg Hx    Allergic rhinitis Neg Hx    Asthma Neg Hx     Past Surgical History:  Procedure Laterality Date   ANTERIOR CRUCIATE LIGAMENT REPAIR Left 07/18/2021   Procedure: LEFT KNEE ANTERIOR CRUCIATE LIGAMENT RECONSTRUCTION, HAMSTRING AUTOGRAFT, MENISCAL DEBRIDEMENT;  Surgeon: Meredith Pel, MD;  Location: [facility]  OR;  Service: Orthopedics;  Laterality: Left;   BREAST BIOPSY Left 2008   benign   COLONOSCOPY  06/2011   Dr. Britta Mccreedy: per patient it was normal, follow up in 10 years.    ESOPHAGOGASTRODUODENOSCOPY     Dr. Berneta Sages: late 1990s/early 2000   ESOPHAGOGASTRODUODENOSCOPY N/A 05/19/2014   SLF:NO OBVIOUS SOURCE FOR DYSGEUSIA IDENTIFED/MILD Non-erosive gastritis   INCISION AND DRAINAGE Right 07/31/2017   Procedure: INCISION AND DRAINAGE RIGHT THUMB NAIL BED;  Surgeon: Charlotte Crumb, MD;  Location: [place];  Service: Orthopedics;  Laterality: Right;   INGUINAL HERNIA REPAIR     KNEE SURGERY Right    NAILBED REPAIR Right 07/31/2017   Procedure: NAILBED BIOPSY;  Surgeon: Charlotte Crumb, MD;  Location: [place];  Service: Orthopedics;  Laterality: Right;   TONSILLECTOMY AND ADENOIDECTOMY     Social History   Occupational History   Occupation: Therapist, sports at [employer]: [place]  Tobacco Use   Smoking status: Never   Smokeless tobacco: Never  Vaping Use   Vaping Use: Never used  Substance and Sexual Activity   Alcohol use: No   Drug use: No   Sexual activity: Yes    Birth  control/protection: Post-menopausal, None

## 2021-12-14 ENCOUNTER — Other Ambulatory Visit (HOSPITAL_COMMUNITY): Payer: Self-pay

## 2021-12-15 ENCOUNTER — Other Ambulatory Visit: Payer: Self-pay

## 2021-12-15 ENCOUNTER — Other Ambulatory Visit (HOSPITAL_COMMUNITY): Payer: Self-pay

## 2021-12-15 DIAGNOSIS — S83512D Sprain of anterior cruciate ligament of left knee, subsequent encounter: Secondary | ICD-10-CM

## 2021-12-15 MED ORDER — POLYETHYLENE GLYCOL 3350 17 GM/SCOOP PO POWD
ORAL | 0 refills | Status: DC
Start: 2021-12-14 — End: 2022-02-05
  Filled 2021-12-19: qty 3094, 90d supply, fill #0

## 2021-12-18 ENCOUNTER — Encounter: Payer: Self-pay | Admitting: Obstetrics & Gynecology

## 2021-12-18 LAB — CYTOLOGY - PAP
Adequacy: ABSENT
Comment: NEGATIVE
Diagnosis: NEGATIVE
High risk HPV: NEGATIVE

## 2021-12-19 ENCOUNTER — Other Ambulatory Visit (HOSPITAL_COMMUNITY): Payer: Self-pay

## 2021-12-19 MED ORDER — NUVESSA 1.3 % VA GEL
1.0000 | Freq: Once | VAGINAL | 1 refills | Status: AC
  Filled 2021-12-19: qty 5, 1d supply, fill #0

## 2021-12-19 NOTE — Addendum Note (Signed)
Addended by: Florian Buff on: 12/19/2021 12:31 PM   Modules accepted: Orders

## 2021-12-20 ENCOUNTER — Other Ambulatory Visit (HOSPITAL_COMMUNITY): Payer: Self-pay

## 2021-12-26 DIAGNOSIS — H0102B Squamous blepharitis left eye, upper and lower eyelids: Secondary | ICD-10-CM | POA: Diagnosis not present

## 2021-12-26 DIAGNOSIS — H0102A Squamous blepharitis right eye, upper and lower eyelids: Secondary | ICD-10-CM | POA: Diagnosis not present

## 2021-12-26 DIAGNOSIS — H2513 Age-related nuclear cataract, bilateral: Secondary | ICD-10-CM | POA: Diagnosis not present

## 2021-12-26 DIAGNOSIS — H40023 Open angle with borderline findings, high risk, bilateral: Secondary | ICD-10-CM | POA: Diagnosis not present

## 2021-12-26 DIAGNOSIS — H2000 Unspecified acute and subacute iridocyclitis: Secondary | ICD-10-CM | POA: Diagnosis not present

## 2021-12-26 DIAGNOSIS — H1045 Other chronic allergic conjunctivitis: Secondary | ICD-10-CM | POA: Diagnosis not present

## 2022-01-19 ENCOUNTER — Ambulatory Visit: Payer: 59 | Admitting: Physical Medicine and Rehabilitation

## 2022-01-19 ENCOUNTER — Encounter: Payer: Self-pay | Admitting: Physical Medicine and Rehabilitation

## 2022-01-19 DIAGNOSIS — R202 Paresthesia of skin: Secondary | ICD-10-CM | POA: Diagnosis not present

## 2022-01-19 NOTE — Progress Notes (Unsigned)
Pt state pain and numbness that travels down her left knee to her big toe. Pt state hx of left knee surgery. Pt state her leg feel like it has weights that feels very painful. Pt state walking and standing makes the pain worse. Pt state she takes over the counter pain meds and uses ice and heat to help ease her pain.  Numeric Pain Rating Scale and Functional Assessment Average Pain 7   In the last MONTH (on 0-10 scale) has pain interfered with the following?  1. General activity like being  able to carry out your everyday physical activities such as walking, climbing stairs, carrying groceries, or moving a chair?  Rating(10)   -BT

## 2022-01-22 NOTE — Progress Notes (Unsigned)
Rose Delgado - 61 y.o. female MRN [id number]  Date of birth: Sep 03, 1960  Office Visit Note: Visit Date: 01/19/2022 PCP: Caryl Bis, MD Referred by: Donella Stade, PA-C  Subjective: Chief Complaint  Patient presents with   Left Leg - Pain, Numbness   Left Knee - Numbness, Pain   Left Foot - Numbness, Pain   HPI:  Rose Delgado is a 61 y.o. female who comes in today at the request of [facility], PA-C for electrodiagnostic study of the Left lower extremities.  She describes a chronic 7 out of 10 pain numbness and tingling from the left posterior knee to the big toe.  Distribution clinically is more L5 versus fibular nerve.  She does get some symptoms on the medial side of the leg.  She has history of left knee surgery ACL repair and feels like her leg is very heavy and painful with ambulation and standing.  She has no prior electrodiagnostic studies.  No prior history of lumbar surgery.  Lumbar MRI from 2018 showed really mild degenerative changes for age.   ROS Otherwise per HPI.  Assessment & Plan: Visit Diagnoses:    ICD-10-CM   1. 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  However, this likely does not explain the totality of her symptoms.  Careful clinical correlation is paramount.    There is no significant electrodiagnostic evidence of any other focal nerve entrapment, lumbosacral plexopathy or lumbar radiculopathy.   Recommendations: 1.  Follow-up with referring physician. 2.  Continue current management of symptoms.  Meds & Orders: No orders of the defined types were placed in this encounter.   Orders Placed This Encounter  Procedures   NCV with EMG (electromyography)    Follow-up: Return in about 2 weeks (around 02/02/2022) for Rose Lee, PA-C.   Procedures: No  procedures performed  EMG & NCV Findings: Evaluation of the left saphenous sensory nerve showed prolonged distal peak latency (5.3 ms).  All remaining nerves (as indicated in the following tables) were within normal limits.    All examined muscles (as indicated in the following table) showed no evidence of electrical instability.    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  However, this likely does not explain the totality of her symptoms.  Careful clinical correlation is paramount.    There is no significant electrodiagnostic evidence of any other focal nerve entrapment, lumbosacral plexopathy or lumbar radiculopathy.   Recommendations: 1.  Follow-up with referring physician. 2.  Continue current management of symptoms.  ___________________________ Laurence Spates FAAPMR Board Certified, American Board of Physical Medicine and Rehabilitation    Nerve Conduction Studies Anti Sensory Summary Table   Stim Site NR Peak (ms) Norm Peak (ms) P-T Amp (V) Norm P-T Amp Site1 Site2 Delta-P (ms) Dist (cm) Vel (m/s) Norm Vel (m/s)  Left Saphenous Anti Sensory (Ant Med Mall)  27.5C  14cm    *5.3 <4.4 6.2 >2 14cm Ant Med Mall 5.3 0.0  >32  Site 2    5.6  16.8         Left Sup Fibular Anti Sensory (Ant Lat Mall)  27.5C  14 cm    3.7 <4.4 25.6 >5.0 14 cm Ant Lat Mall 3.7 14.0  38 >32  Left Sural Anti Sensory (Lat Mall)  27.5C  Calf    4.0 <4.0 22.4 >5.0 Calf Lat Mall 4.0 14.0 35 >35   Motor Summary Table   Stim Site NR Onset (ms) Norm Onset (ms) O-P Amp (mV) Norm O-P Amp Site1 Site2 Delta-0 (ms) Dist (cm) Vel (m/s) Norm Vel (m/s)  Left Fibular Motor (Ext Dig Brev)  27.3C  Ankle    3.4 <6.1 7.3 >2.5 B Fib Ankle 6.2 29.0 47 >38  B Fib    9.6  3.2  Poplt B Fib 1.4 9.0 64 >40  Poplt    11.0  6.6         Left Tibial Motor (Abd Hall Brev)  27.4C  Ankle    5.9 <6.1 3.6 >3.0 Knee Ankle 5.4 35.0  65 >35  Knee    11.3  9.4          EMG   Side Muscle Nerve Root Ins Act Fibs Psw Amp Dur Poly Recrt Int Fraser Din Comment  Left AntTibialis Dp Br Peron L4-5 Nml Nml Nml Nml Nml 0 Nml Nml   Left Fibularis Longus  Sup Br Peron L5-S1 Nml Nml Nml Nml Nml 0 Nml Nml   Left MedGastroc Tibial S1-2 Nml Nml Nml Nml Nml 0 Nml Nml   Left VastusMed Femoral L2-4 Nml Nml Nml Nml Nml 0 Nml Nml   Left BicepsFemS Sciatic L5-S1 Nml Nml Nml Nml Nml 0 Nml Nml     Nerve Conduction Studies Anti Sensory Left/Right Comparison   Stim Site L Lat (ms) R Lat (ms) L-R Lat (ms) L Amp (V) R Amp (V) L-R Amp (%) Site1 Site2 L Vel (m/s) R Vel (m/s) L-R Vel (m/s)  Saphenous Anti Sensory (Ant Med Mall)  27.5C  14cm *5.3   6.2   14cm Ant Med Mall     Site 2 5.6   16.8         Sup Fibular Anti Sensory (Ant Lat Mall)  27.5C  14 cm 3.7   25.6   14 cm Ant Lat Mall 38    Sural Anti Sensory (Lat Mall)  27.5C  Calf 4.0   22.4   Calf Lat Mall 35     Motor Left/Right Comparison   Stim Site L Lat (ms) R Lat (ms) L-R Lat (ms) L Amp (mV) R Amp (mV) L-R Amp (%) Site1 Site2 L Vel (m/s) R Vel (m/s) L-R Vel (m/s)  Fibular Motor (Ext Dig Brev)  27.3C  Ankle 3.4   7.3   B Fib Ankle 47    B Fib 9.6   3.2   Poplt B Fib 64    Poplt 11.0   6.6         Tibial Motor (Abd Hall Brev)  27.4C  Ankle 5.9   3.6   Knee Ankle 65    Knee 11.3   9.4            Waveforms:             Clinical History: No specialty comments available.     Objective:  VS:  HT:    WT:   BMI:     BP:   HR: bpm  TEMP: ( )  RESP:  Physical Exam Vitals and nursing note reviewed.  Constitutional:      General: She is not in acute distress.    Appearance: Normal appearance. She is well-developed. She is not ill-appearing.  HENT:     Head:  Normocephalic and atraumatic.  Eyes:     Conjunctiva/sclera: Conjunctivae normal.     Pupils: Pupils are equal, round, and reactive to light.  Cardiovascular:     Rate and Rhythm: Normal rate.     Pulses:  Normal pulses.  Pulmonary:     Effort: Pulmonary effort is normal.  Musculoskeletal:     Right lower leg: No edema.     Left lower leg: No edema.     Comments: Examination of the left leg does not show much in the way of atrophy of the lower extremity muscles bilaterally.  Mild foot edema on the left more than right.  She does ambulate with a cane.  There is well-healed surgical scars on the left.  She has good strength with dorsiflexion plantarflexion EHL bilaterally.  Dysesthesia in the lower extremity somewhat nondermatomal.  Skin:    General: Skin is warm and dry.     Findings: No erythema or rash.  Neurological:     Mental Status: She is alert and oriented to person, place, and time.     Cranial Nerves: No cranial nerve deficit.     Sensory: Sensory deficit present.     Motor: No weakness or abnormal muscle tone.     Coordination: Coordination normal.     Gait: Gait abnormal.  Psychiatric:        Mood and Affect: Mood normal.        Behavior: Behavior normal.      Imaging: No results found.

## 2022-01-23 ENCOUNTER — Other Ambulatory Visit (HOSPITAL_COMMUNITY): Payer: Self-pay

## 2022-01-23 NOTE — Procedures (Signed)
EMG & NCV Findings: Evaluation of the left saphenous sensory nerve showed prolonged distal peak latency (5.3 ms).  All remaining nerves (as indicated in the following tables) were within normal limits.    All examined muscles (as indicated in the following table) showed no evidence of electrical instability.    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  However, this likely does not explain the totality of her symptoms.  Careful clinical correlation is paramount.    There is no significant electrodiagnostic evidence of any other focal nerve entrapment, lumbosacral plexopathy or lumbar radiculopathy.   Recommendations: 1.  Follow-up with referring physician. 2.  Continue current management of symptoms.  ___________________________ Laurence Spates FAAPMR Board Certified, American Board of Physical Medicine and Rehabilitation    Nerve Conduction Studies Anti Sensory Summary Table   Stim Site NR Peak (ms) Norm Peak (ms) P-T Amp (V) Norm P-T Amp Site1 Site2 Delta-P (ms) Dist (cm) Vel (m/s) Norm Vel (m/s)  Left Saphenous Anti Sensory (Ant Med Mall)  27.5C  14cm    *5.3 <4.4 6.2 >2 14cm Ant Med Mall 5.3 0.0  >32  Site 2    5.6  16.8         Left Sup Fibular Anti Sensory (Ant Lat Mall)  27.5C  14 cm    3.7 <4.4 25.6 >5.0 14 cm Ant Lat Mall 3.7 14.0 38 >32  Left Sural Anti Sensory (Lat Mall)  27.5C  Calf    4.0 <4.0 22.4 >5.0 Calf Lat Mall 4.0 14.0 35 >35   Motor Summary Table   Stim Site NR Onset (ms) Norm Onset (ms) O-P Amp (mV) Norm O-P Amp Site1 Site2 Delta-0 (ms) Dist (cm) Vel (m/s) Norm Vel (m/s)  Left Fibular Motor (Ext Dig Brev)  27.3C  Ankle    3.4 <6.1 7.3 >2.5 B Fib Ankle 6.2 29.0 47 >38  B Fib    9.6  3.2  Poplt B Fib 1.4 9.0 64 >40  Poplt    11.0  6.6         Left Tibial Motor (Abd Hall Brev)  27.4C  Ankle    5.9 <6.1 3.6 >3.0 Knee Ankle 5.4 35.0 65 >35  Knee    11.3   9.4          EMG   Side Muscle Nerve Root Ins Act Fibs Psw Amp Dur Poly Recrt Int Fraser Din Comment  Left AntTibialis Dp Br Peron L4-5 Nml Nml Nml Nml Nml 0 Nml Nml   Left Fibularis Longus  Sup Br Peron L5-S1 Nml Nml Nml Nml Nml 0 Nml Nml   Left MedGastroc Tibial S1-2 Nml Nml Nml Nml Nml 0 Nml Nml   Left VastusMed Femoral L2-4 Nml Nml Nml Nml Nml 0 Nml Nml   Left BicepsFemS Sciatic L5-S1 Nml Nml Nml Nml Nml 0 Nml Nml     Nerve Conduction Studies Anti Sensory Left/Right Comparison   Stim Site L Lat (ms) R Lat (ms) L-R Lat (ms) L Amp (V) R Amp (V) L-R Amp (%) Site1 Site2 L Vel (m/s) R Vel (m/s) L-R Vel (m/s)  Saphenous Anti Sensory (Ant Med Mall)  27.5C  14cm *5.3   6.2   14cm Ant Med Mall     Site 2 5.6   16.8         Sup Fibular Anti Sensory (Ant Lat Mall)  27.5C  14 cm 3.7   25.6  14 cm Ant Lat Mall 38    Sural Anti Sensory (Lat Mall)  27.5C  Calf 4.0   22.4   Calf Lat Mall 35     Motor Left/Right Comparison   Stim Site L Lat (ms) R Lat (ms) L-R Lat (ms) L Amp (mV) R Amp (mV) L-R Amp (%) Site1 Site2 L Vel (m/s) R Vel (m/s) L-R Vel (m/s)  Fibular Motor (Ext Dig Brev)  27.3C  Ankle 3.4   7.3   B Fib Ankle 47    B Fib 9.6   3.2   Poplt B Fib 64    Poplt 11.0   6.6         Tibial Motor (Abd Hall Brev)  27.4C  Ankle 5.9   3.6   Knee Ankle 65    Knee 11.3   9.4            Waveforms:

## 2022-01-27 ENCOUNTER — Emergency Department (HOSPITAL_COMMUNITY): Payer: 59

## 2022-01-27 ENCOUNTER — Emergency Department (HOSPITAL_COMMUNITY)
Admission: EM | Admit: 2022-01-27 | Discharge: 2022-01-27 | Disposition: A | Discharge status: Home / Self Care | Payer: 59 | Attending: Emergency Medicine | Admitting: Emergency Medicine

## 2022-01-27 ENCOUNTER — Encounter (HOSPITAL_COMMUNITY): Payer: Self-pay

## 2022-01-27 ENCOUNTER — Other Ambulatory Visit: Payer: Self-pay

## 2022-01-27 DIAGNOSIS — R0789 Other chest pain: Secondary | ICD-10-CM | POA: Insufficient documentation

## 2022-01-27 DIAGNOSIS — R079 Chest pain, unspecified: Secondary | ICD-10-CM

## 2022-01-27 DIAGNOSIS — Z91040 Latex allergy status: Secondary | ICD-10-CM | POA: Diagnosis not present

## 2022-01-27 DIAGNOSIS — Z79899 Other long term (current) drug therapy: Secondary | ICD-10-CM | POA: Insufficient documentation

## 2022-01-27 DIAGNOSIS — I10 Essential (primary) hypertension: Secondary | ICD-10-CM | POA: Insufficient documentation

## 2022-01-27 LAB — CBC
HCT: 43.1 % (ref 36.0–46.0)
Hemoglobin: 14.3 g/dL (ref 12.0–15.0)
MCH: 30.9 pg (ref 26.0–34.0)
MCHC: 33.2 g/dL (ref 30.0–36.0)
MCV: 93.1 fL (ref 80.0–100.0)
Platelets: 280 10*3/uL (ref 150–400)
RBC: 4.63 MIL/uL (ref 3.87–5.11)
RDW: 12.6 % (ref 11.5–15.5)
WBC: 9.9 10*3/uL (ref 4.0–10.5)
nRBC: 0 % (ref 0.0–0.2)

## 2022-01-27 LAB — D-DIMER, QUANTITATIVE: D-Dimer, Quant: <0.27 ug/mL-FEU (ref 0.00–0.50)

## 2022-01-27 LAB — BASIC METABOLIC PANEL
Anion gap: 8 (ref 5–15)
BUN: 13 mg/dL (ref 8–23)
CO2: 22 mmol/L (ref 22–32)
Calcium: 9.9 mg/dL (ref 8.9–10.3)
Chloride: 107 mmol/L (ref 98–111)
Creatinine, Ser: 0.98 mg/dL (ref 0.44–1.00)
GFR, Estimated: >60 mL/min (ref >60)
Glucose, Bld: 104 mg/dL — ABNORMAL HIGH (ref 70–99)
Potassium: 3.6 mmol/L (ref 3.5–5.1)
Sodium: 137 mmol/L (ref 135–145)

## 2022-01-27 LAB — TROPONIN I (HIGH SENSITIVITY): Troponin I (High Sensitivity): 3 ng/L (ref <18)

## 2022-01-27 MED ORDER — ASPIRIN 81 MG PO CHEW: 324.0000 mg | CHEWABLE_TABLET | Freq: Once | ORAL | Status: DC

## 2022-01-27 NOTE — ED Triage Notes (Signed)
CP since last Thursday. SOB and pressure today. Nausea. 4 baby aspirin at 1230

## 2022-01-27 NOTE — Discharge Instructions (Signed)
I have messaged [name] and asked them to get you into the office sooner to be evaluated for the chest pain.  Thankfully your testing today does not show any signs of abnormal findings in fact your troponin was undetectable, the D-dimer was negative making it extremely unlikely for this to be a heart attack or blood clot.  That being said it is very likely that you do have some blockages in your heart and you probably do need a stress test or some other evaluation.  I would like for you to return to the ER for severe or worsening symptoms but I would like for you to see the cardiologist soon and I have asked them to make this appointment for you.  If you do not hear from them on Monday please call them to make a sooner appointment.  Emergency department for worsening symptoms

## 2022-01-27 NOTE — ED Notes (Signed)
Pt d/c home per MD order. Discharge summary reviewed with pt, pt verbalizes understanding. Ambulatory off unit with visitor. No s/s of acute distress noted at discharge.

## 2022-01-27 NOTE — ED Provider Notes (Signed)
[facility] Provider Note   CSN: [id number] Arrival date & time: 01/27/22  1259     History  Chief Complaint  Patient presents with   Chest Pain    Rose Delgado is a 61 y.o. female.   Chest Pain  This patient is a very pleasant 61 year old female, no history of diabetes or primary cardiac disease, she does take medication such as diltiazem due to some history of blood pressure, she has not been seen by cardiology in quite some time, though she was evaluated by phone through the cardiology service because of some diltiazem related bradycardia.  Her dose was reduced at that time.  That was in May 2023.  She has recently struggled with knee pain and had an immobilizer on her leg after rupture of her ACL  Last visit in the office with cardiology was August 2022, the patient had a history of palpitations and hypertension.  Review of the medical record shows that the patient had an echocardiogram and heart February 2022, essentially normal function with greater than 75% ejection fraction and no signs of diastolic dysfunction.  The patient presents today with a complaint of some chest discomfort.  She reports that for about a week and a half she has had a pain between her shoulder blades and around her neck.  Over the last few days this is transition to radiate towards her chest sometimes sharp and shooting, sometimes it is a heavy pressure on the left side.  She is not short of breath with it but does get occasionally nauseated, no diaphoresis or swelling of the legs.  She presents with this complaint.  She does have a family history of coronary disease but in the elderly folks not any young disease.  She does not smoke    Home Medications Prior to Admission medications   Medication Sig Start Date End Date Taking? Authorizing Provider  Azelastine HCl 137 MCG/SPRAY SOLN Place 2 sprays into the nose 2 (two) times daily as needed. 10/30/21 10/30/22  Dara Hoyer, FNP   Biotin 5 MG CAPS Take 5 mg by mouth daily.    [provider]  cetirizine (ZYRTEC) 10 MG tablet Take 1 tablet by mouth every day 10/04/21     cyclobenzaprine (FLEXERIL) 10 MG tablet Take 1 tablet by mouth every 8 (eight) hours as needed. 12/25/16   [provider]  diltiazem (CARDIZEM CD) 240 MG 24 hr capsule Take 1 capsule by mouth daily. 11/14/21   Arnoldo Lenis, MD  diltiazem (CARDIZEM) 30 MG tablet TAKE 1 TABLET BY MOUTH TWICE A DAY AS NEEDED FOR BREAKTHROUGH PALPITATIONS. 07/14/20 07/14/21  Verta Ellen., NP  EPINEPHrine 0.3 mg/0.3 mL IJ SOAJ injection Use as directed for severe allergic reaction. 10/30/21   Dara Hoyer, FNP  ergocalciferol (VITAMIN D2) 1.25 MG (50000 UT) capsule Take 2 capsules by mouth once a week 04/08/20     famotidine (PEPCID) 20 MG tablet Take 1 tablet (20 mg total) by mouth 2 (two) times daily. Patient taking differently: Take 20 mg by mouth daily. 11/11/20   Valentina Shaggy, MD  fluticasone [facility]) 50 MCG/ACT nasal spray USE 2 SPRAY INTO EACH NOSTRIL EVERY DAY 10/04/21     hydrALAZINE (APRESOLINE) 25 MG tablet Take 1 tablet by mouth two times a day 10/04/21     Magnesium 250 MG TABS Take 250 mg by mouth daily.    [provider]  methocarbamol (ROBAXIN) 500 MG tablet Take 500 mg  by mouth every 8 (eight) hours as needed for muscle spasms.    [provider]  montelukast (SINGULAIR) 10 MG tablet Take 1 tablet by mouth every day 10/04/21     Multiple Vitamins-Minerals (SM COMPLETE 50+) TABS Take 1 tablet by mouth daily. 06/30/18   [provider]  ondansetron (ZOFRAN) 4 MG tablet Take 1 tablet (4 mg total) by mouth every 4 (four) hours as needed for nausea or vomiting. 07/14/14   Fields, Marga Melnick, MD  pantoprazole (PROTONIX) 40 MG tablet TAKE 1 TABLET BY MOUTH DAILY FOR ACID REFLUX 10/04/21     polyethylene glycol powder (CLEARLAX) 17 GM/SCOOP powder DISSOLVE 17 GRAMS IN LIQUID AND DRINK BY MOUTH TWICE DAILY FOR CONSTIPATION  10/04/21     polyethylene glycol powder (CLEARLAX) 17 GM/SCOOP powder DISSOLVE 17 GRAMS IN LIQUID AND DRINK BY MOUTH TWICE DAILY FOR CONSTIPATION 11/22/21     polyethylene glycol powder (MIRALAX) 17 GM/SCOOP powder DISSOLVE 17 GRAMS IN LIQUID AND DRINK BY MOUTH TWICE DAILY FOR CONSTIP ATION 12/14/21     Potassium Chloride ER 20 MEQ TBCR Take 1 tablet (20 mEq) by mouth daily. 04/12/21   Strader, Fransisco Hertz, PA-C  Pyridoxine HCl (VITAMIN B6 PO) Take 1 tablet by mouth daily.    [provider]  rizatriptan (MAXALT-MLT) 10 MG disintegrating tablet Take 10 mg by mouth as needed for migraine. May repeat in 2 hours if needed    [provider]      Allergies    Metoprolol, Shellfish allergy, Flagyl [metronidazole], Gabapentin (once-daily), Lactose intolerance (gi), Losartan, Topamax [topiramate], Ciprofloxacin hcl, Diflucan [fluconazole], and Latex    Review of Systems   Review of Systems  Cardiovascular:  Positive for chest pain.  All other systems reviewed and are negative.   Physical Exam Updated Vital Signs BP 139/81   Pulse 71   Temp (!) 97.5 F (36.4 C) (Oral)   Resp (!) 22   Ht 1.6 m ('5\' 3"'$ )   Wt 68 kg   LMP 07/29/2017   SpO2 97%   BMI 26.57 kg/m  Physical Exam Vitals and nursing note reviewed.  Constitutional:      General: She is not in acute distress.    Appearance: She is well-developed.  HENT:     Head: Normocephalic and atraumatic.     Mouth/Throat:     Pharynx: No oropharyngeal exudate.  Eyes:     General: No scleral icterus.       Right eye: No discharge.        Left eye: No discharge.     Conjunctiva/sclera: Conjunctivae normal.     Pupils: Pupils are equal, round, and reactive to light.  Neck:     Thyroid: No thyromegaly.     Vascular: No JVD.  Cardiovascular:     Rate and Rhythm: Normal rate and regular rhythm.     Heart sounds: Normal heart sounds. No murmur heard.    No friction rub. No gallop.  Pulmonary:     Effort: Pulmonary effort is  normal. No respiratory distress.     Breath sounds: Normal breath sounds. No wheezing or rales.  Abdominal:     General: Bowel sounds are normal. There is no distension.     Palpations: Abdomen is soft. There is no mass.     Tenderness: There is no abdominal tenderness.  Musculoskeletal:        General: No tenderness. Normal range of motion.     Cervical back: Normal range of motion and  neck supple.  Lymphadenopathy:     Cervical: No cervical adenopathy.  Skin:    General: Skin is warm and dry.     Findings: No erythema or rash.  Neurological:     Mental Status: She is alert.     Coordination: Coordination normal.  Psychiatric:        Behavior: Behavior normal.     ED Results / Procedures / Treatments   Labs (all labs ordered are listed, but only abnormal results are displayed) Labs Reviewed  BASIC METABOLIC PANEL - Abnormal; Notable for the following components:      Result Value   Glucose, Bld 104 (*)    All other components within normal limits  CBC  D-DIMER, QUANTITATIVE  TROPONIN I (HIGH SENSITIVITY)    EKG EKG Interpretation  Date/Time:  Saturday January 27 2022 13:24:57 EDT Ventricular Rate:  84 PR Interval:  164 QRS Duration: 77 QT Interval:  360 QTC Calculation: 426 R Axis:   63 Text Interpretation: Sinus rhythm Ventricular premature complex Probable left atrial enlargement Probable LVH with secondary repol abnrm Since last tracing rate faster no other significant changes Confirmed by Noemi Chapel [phone]) on 01/27/2022 1:30:40 PM  Radiology DG Chest 2 View  Result Date: 01/27/2022 CLINICAL DATA:  Chest pain EXAM: CHEST - 2 VIEW COMPARISON:  11/29/2017 FINDINGS: The heart size and mediastinal contours are within normal limits. Both lungs are clear. The visualized skeletal structures are unremarkable. IMPRESSION: No active cardiopulmonary disease. Electronically Signed   By: Davina Poke D.O.   On: 01/27/2022 14:26    Procedures Procedures     Medications Ordered in ED Medications  aspirin chewable tablet 324 mg (324 mg Oral Not Given 01/27/22 1344)    ED Course/ Medical Decision Making/ A&P                           Medical Decision Making Amount and/or Complexity of Data Reviewed Labs: ordered. Radiology: ordered.  Risk OTC drugs.   This patient presents to the ED for concern of chest pain, this involves an extensive number of treatment options, and is a complaint that carries with it a high risk of complications and morbidity.  The differential diagnosis includes acute coronary syndrome, pulmonary embolism especially given the recent relative immobilization   Co morbidities that complicate the patient evaluation  Hypertension   Additional history obtained:  Additional history obtained from family as well as medical record External records from outside source obtained and reviewed including recent echocardiogram from about a year and a half ago   Lab Tests:  I Ordered, and personally interpreted labs.  The pertinent results include: CBC metabolic panel D-dimer and troponin are all within normal limits without any acute findings   Imaging Studies ordered:  I ordered imaging studies including chest x-ray I independently visualized and interpreted imaging which showed no acute finding I agree with the radiologist interpretation   Cardiac Monitoring: / EKG:  The patient was maintained on a cardiac monitor.  I personally viewed and interpreted the cardiac monitored which showed an underlying rhythm of: Normal sinus rhythm, no arrhythmias other than occasional ectopy   Consultations Obtained:  Message cardiology for close follow-up No need for acute emergent intervention or consultation in the emergency department today   Problem List / ED Course / Critical interventions / Medication management  The patient has had chest discomfort, she was given aspirin, her lab work-up was unremarkable and she was  informed of all of these results.  Her vital signs have normalized down to a blood pressure of 139/81 with a pulse of 71.  Given that she had an unremarkable EKG I feel comfortable letting the patient go home The patient took aspirin prior to arrival Reevaluation of the patient after these medicines showed that the patient improved I have reviewed the patients home medicines and have made adjustments as needed   Social Determinants of Health:  None   Test / Admission - Considered:  Considered admission and further testing but the patient and negative work-up and has had ongoing pain for over 6 hours making a negative troponin very predictable that this is not ischemic pain  I have discussed with the patient at the bedside the results, and the meaning of these results.  They have expressed her understanding to the need for follow-up with primary care physician       Final Clinical Impression(s) / ED Diagnoses Final diagnoses:  Chest pain, unspecified type    Rx / DC Orders ED Discharge Orders     None         Noemi Chapel, MD 01/27/22 1439

## 2022-01-28 ENCOUNTER — Other Ambulatory Visit: Payer: Self-pay | Admitting: Allergy & Immunology

## 2022-01-29 ENCOUNTER — Telehealth: Payer: Self-pay | Admitting: Physician Assistant

## 2022-01-29 ENCOUNTER — Telehealth: Payer: Self-pay

## 2022-01-29 ENCOUNTER — Encounter: Payer: Self-pay | Admitting: Physician Assistant

## 2022-01-29 ENCOUNTER — Encounter (HOSPITAL_COMMUNITY): Payer: Self-pay

## 2022-01-29 ENCOUNTER — Ambulatory Visit: Payer: 59 | Admitting: Physician Assistant

## 2022-01-29 ENCOUNTER — Other Ambulatory Visit (HOSPITAL_COMMUNITY): Payer: Self-pay

## 2022-01-29 ENCOUNTER — Emergency Department (HOSPITAL_COMMUNITY)
Admission: EM | Admit: 2022-01-29 | Discharge: 2022-01-29 | Discharge status: Against Medical Advice | Payer: 59 | Attending: Emergency Medicine | Admitting: Emergency Medicine

## 2022-01-29 ENCOUNTER — Other Ambulatory Visit: Payer: Self-pay

## 2022-01-29 VITALS — BP 160/62 | HR 60 | Ht 63.0 in | Wt 151.4 lb

## 2022-01-29 DIAGNOSIS — I10 Essential (primary) hypertension: Secondary | ICD-10-CM

## 2022-01-29 DIAGNOSIS — I493 Ventricular premature depolarization: Secondary | ICD-10-CM | POA: Diagnosis not present

## 2022-01-29 DIAGNOSIS — R079 Chest pain, unspecified: Secondary | ICD-10-CM | POA: Insufficient documentation

## 2022-01-29 DIAGNOSIS — Z5321 Procedure and treatment not carried out due to patient leaving prior to being seen by health care provider: Secondary | ICD-10-CM | POA: Insufficient documentation

## 2022-01-29 DIAGNOSIS — R0789 Other chest pain: Secondary | ICD-10-CM | POA: Diagnosis not present

## 2022-01-29 DIAGNOSIS — M546 Pain in thoracic spine: Secondary | ICD-10-CM | POA: Diagnosis not present

## 2022-01-29 LAB — CBC WITH DIFFERENTIAL/PLATELET
Abs Immature Granulocytes: 0.02 10*3/uL (ref 0.00–0.07)
Basophils Absolute: 0 10*3/uL (ref 0.0–0.1)
Basophils Relative: 0 %
Eosinophils Absolute: 0.1 10*3/uL (ref 0.0–0.5)
Eosinophils Relative: 1 %
HCT: 42.6 % (ref 36.0–46.0)
Hemoglobin: 13.8 g/dL (ref 12.0–15.0)
Immature Granulocytes: 0 %
Lymphocytes Relative: 30 %
Lymphs Abs: 2.1 10*3/uL (ref 0.7–4.0)
MCH: 30.9 pg (ref 26.0–34.0)
MCHC: 32.4 g/dL (ref 30.0–36.0)
MCV: 95.3 fL (ref 80.0–100.0)
Monocytes Absolute: 0.7 10*3/uL (ref 0.1–1.0)
Monocytes Relative: 10 %
Neutro Abs: 4.2 10*3/uL (ref 1.7–7.7)
Neutrophils Relative %: 59 %
Platelets: 277 10*3/uL (ref 150–400)
RBC: 4.47 MIL/uL (ref 3.87–5.11)
RDW: 12.4 % (ref 11.5–15.5)
WBC: 7 10*3/uL (ref 4.0–10.5)
nRBC: 0 % (ref 0.0–0.2)

## 2022-01-29 LAB — COMPREHENSIVE METABOLIC PANEL
ALT: 69 U/L — ABNORMAL HIGH (ref 0–44)
AST: 46 U/L — ABNORMAL HIGH (ref 15–41)
Albumin: 4.2 g/dL (ref 3.5–5.0)
Alkaline Phosphatase: 109 U/L (ref 38–126)
Anion gap: 6 (ref 5–15)
BUN: 9 mg/dL (ref 8–23)
CO2: 26 mmol/L (ref 22–32)
Calcium: 10.2 mg/dL (ref 8.9–10.3)
Chloride: 107 mmol/L (ref 98–111)
Creatinine, Ser: 0.83 mg/dL (ref 0.44–1.00)
GFR, Estimated: >60 mL/min (ref >60)
Glucose, Bld: 110 mg/dL — ABNORMAL HIGH (ref 70–99)
Potassium: 4.3 mmol/L (ref 3.5–5.1)
Sodium: 139 mmol/L (ref 135–145)
Total Bilirubin: 0.3 mg/dL (ref 0.3–1.2)
Total Protein: 7 g/dL (ref 6.5–8.1)

## 2022-01-29 LAB — TROPONIN I (HIGH SENSITIVITY)
Troponin I (High Sensitivity): 4 ng/L (ref <18)
Troponin I (High Sensitivity): 4 ng/L (ref <18)

## 2022-01-29 LAB — LIPASE, BLOOD: Lipase: 31 U/L (ref 11–51)

## 2022-01-29 MED ORDER — METOPROLOL TARTRATE 25 MG PO TABS
25.0000 mg | ORAL_TABLET | Freq: Once | ORAL | 0 refills | Status: DC
  Filled 2022-01-29: qty 1, 1d supply, fill #0

## 2022-01-29 MED ORDER — ASPIRIN 81 MG PO CHEW: 324.0000 mg | CHEWABLE_TABLET | Freq: Once | ORAL | Status: DC

## 2022-01-29 NOTE — Telephone Encounter (Signed)
Spoke with check out who scheduled stat CT for tomorrow at 8:30 am with a 8am arrival. Speciual instructions no food 4 hours prior and liquids only.   Patient notified, agreeable and voiced understanding. (I did explain to the patient that the test can only be completed a [place] hospital due to the gated part of the CT)

## 2022-01-29 NOTE — ED Notes (Signed)
Pt no longer wanted to wait pt went home.

## 2022-01-29 NOTE — Patient Instructions (Addendum)
Medication Instructions:  Your physician recommends that you continue on your current medications as directed. Please refer to the Current Medication list given to you today. *If you need a refill on your cardiac medications before your next appointment, please call your pharmacy*   Lab Work: None Ordered If you have labs (blood work) drawn today and your tests are completely normal, you will receive your results only by: [place] (if you have MyChart) OR A paper copy in the mail If you have any lab test that is abnormal or we need to change your treatment, we will call you to review the results.   Testing/Procedures: Coronary CTA w/ FFR STAT CT ANGIO ABD/PEL  Follow-Up: At [facility], you and your health needs are our priority.  As part of our continuing mission to provide you with exceptional heart care, we have created designated Provider Care Teams.  These Care Teams include your primary Cardiologist (physician) and Advanced Practice Providers (APPs -  Physician Assistants and Nurse Practitioners) who all work together to provide you with the care you need, when you need it.  We recommend signing up for the patient portal called "MyChart".  Sign up information is provided on this After Visit Summary.  MyChart is used to connect with patients for Virtual Visits (Telemedicine).  Patients are able to view lab/test results, encounter notes, upcoming appointments, etc.  Non-urgent messages can be sent to your provider as well.   To learn more about what you can do with MyChart, go to [email or web address].    Your next appointment:   First available after CT  The format for your next appointment:   In Person  Provider:   You may see Carlyle Dolly, MD or one of the following Advanced Practice Providers on your designated Care Team:   [place], PA-C  Ermalinda Barrios, PA-C    Other Instructions We need to get a better idea of what your blood pressure is running  at home. Here are some instructions to follow: - I would recommend using a blood pressure cuff that goes on your arm. The wrist ones can be inaccurate. If you're purchasing one for the first time, try to select one that also reports your heart rate because this can be helpful information as well. - To check your blood pressure, choose a time at least 3 hours after taking your blood pressure medicines. If you can sample it at different times of the day, that's great - it might give you more information about how your blood pressure fluctuates. Remain seated in a chair for 5 minutes quietly beforehand, then check it.  - Please record a list of those readings and call us/send in MyChart message with them for our review in 1 week.  Important Information About Sugar      Your cardiac CT will be scheduled at one of the below locations:   [facility] [address] [place], [place] [address] [phone]   If scheduled at [facility], please arrive at the [facility] and Children's Entrance (Entrance C2) of [facility] 30 minutes prior to test start time. You can use the FREE valet parking offered at entrance C (encouraged to control the heart rate for the test)  Proceed to the [facility] Radiology Department (first floor) to check-in and test prep.  All radiology patients and guests should use entrance C2 at [facility], accessed from [facility], even though the hospital's physical address listed is [phone]  [employer].    If scheduled at [facility], please arrive 15 mins early for check-in and test prep.  Please follow these instructions carefully (unless otherwise directed):  On the Night Before the Test: Be sure to Drink plenty of water. Do not consume any caffeinated/decaffeinated beverages or chocolate 12 hours prior to your test. Do not take any antihistamines 12 hours prior to your test.  On the Day of  the Test: Drink plenty of water until 1 hour prior to the test. Do not eat any food 4 hours prior to the test. You may take your regular medications prior to the test.  Take metoprolol (Lopressor) two hours prior to test. HOLD Furosemide/Hydrochlorothiazide morning of the test. FEMALES- please wear underwire-free bra if available, avoid dresses & tight clothing  Please bring your short acting Diltiazem with you on the day of your test.       After the Test: Drink plenty of water. After receiving IV contrast, you may experience a mild flushed feeling. This is normal. On occasion, you may experience a mild rash up to 24 hours after the test. This is not dangerous. If this occurs, you can take Benadryl 25 mg and increase your fluid intake. If you experience trouble breathing, this can be serious. If it is severe call 911 IMMEDIATELY. If it is mild, please call our office. If you take any of these medications: Glipizide/Metformin, Avandament, Glucavance, please do not take 48 hours after completing test unless otherwise instructed.  We will call to schedule your test 2-4 weeks out understanding that some insurance companies will need an authorization prior to the service being performed.   For non-scheduling related questions, please contact the cardiac imaging nurse navigator should you have any questions/concerns: [name], Cardiac Imaging Nurse Navigator Gordy Clement, Cardiac Imaging Nurse Navigator [facility] Direct Office Dial: [phone]   For scheduling needs, including cancellations and rescheduling, please call [place], [phone].

## 2022-01-29 NOTE — Telephone Encounter (Addendum)
Received message from patient that she did go to ER but did not want to stay any longer for her CT study to be completed as she had her 61 year old sister with her. She verbalized understanding that this could potentially be a life threatening condition if present and requests the study be done as outpatient. She prefers this to be done at [facility] I will forward to [place] working with me today to help get this arranged ASAP. I confirmed with the patient that she has an allergy to shellfish only and she states she has never had an allergy to iodine before. She had a CT scan in 2015 with contrast, no pre-medication per chart at that time, tolerated well without allergic reaction per patient.

## 2022-01-29 NOTE — Progress Notes (Addendum)
Cardiology Office Note    Date:  01/29/2022   ID:  Rose, Delgado 1961-06-14, MRN [id number]  PCP:  Caryl Bis, MD  Cardiologist:  Carlyle Dolly, MD  Electrophysiologist:  None   Chief Complaint: f/u chest pain  History of Present Illness:   Rose Delgado is a 61 y.o. 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 She remotely saw Dr. Bronson Ing with event monitor 2018 showing 17% PVCs. She has been treated with diltiazem with follow-up monitor in 2019 showing predominantly NSR with rare PACs/PVCs, no significant arrhythmias. She has a history of SOB following Covid with last echo 08/2020 showing EF >75%, indeterminate diastolic function. This had improved per last OV without specific intervention. 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  The Thursday before last, she began having a persistent discomfort that predominantly felt like it was radiating through her back between her shoulder blades. She would notice sometimes she'd feel sharp shooting discomfort as well if she raised her arm. She initially thought it was musculoskeletal. On Friday 01/26/22 she also began to feel a left sided chest pressure/heaviness as well. She also felt some discomfort with inspiration. She would occasionally feel nauseated with this. Not so much SOB. She was seen in the ER and had negative troponin x 1 and negative d-dimer. CXR NAD. EDP felt unlikely to be ischemia given negative troponin despite duration of discomfort. She was asked to f/u cardiology. She is seen today for follow-up. She has not had the severity of discomfort that she had since Saturday but does note the discomfort has not completely resolved and she still has some awareness of it. It is again described as a feeling under her left chest wrapping around through her back. She was able to clean the living room today without  exacerbating any of the discomfort. She is not SOB. It seems to be exacerbated by exertion, movement in general, or getting excited/hot. She did not try anything at home to improve the pain. Reports SBPs 130s at home. Repeat by me 158/60 in the R, 160/62 in the L. No known injuries.  Labwork independently reviewed: 12/2021 d-dimer wnl, troponin neg, CBC/BMET unremarkable except glucose 104   Cardiology Studies:   Studies reviewed are outlined and summarized above. Reports included below if pertinent.   2d echo 08/2020    1. Left ventricular ejection fraction, by estimation, is >75%. The left  ventricle has normal function. The left ventricle has no regional wall  motion abnormalities. Left ventricular diastolic parameters are  indeterminate. The average left ventricular  global longitudinal strain is -19.0 %. The global longitudinal strain is  normal.   2. Right ventricular systolic function is normal. The right ventricular  size is normal.   3. The mitral valve is normal in structure. No evidence of mitral valve  regurgitation. No evidence of mitral stenosis.   4. The aortic valve has an indeterminant number of cusps. Aortic valve  regurgitation is not visualized. No aortic stenosis is present.   5. The inferior vena cava is normal in size with greater than 50%  respiratory variability, suggesting right atrial pressure of 3 mmHg.   Comparison(s): Echocardiogram done 09/12/16 showed an EF of 65-70%.   Monitor 2019 Sinus rhythm was seen. No arrhythmias  Monitor 2018 Sinus rhythm with frequent PVC's (17% of all beats). No diary was returned.  Past Medical History:  Diagnosis Date   Allergic rhinitis    Angio-edema    Fibroids    uterine   GERD (gastroesophageal reflux disease)    Hypertension    Irregular heart beat    Migraines    Perimenopause 03/09/2014   Postmenopausal 2016   Urticaria     Past Surgical History:  Procedure Laterality Date   ANTERIOR CRUCIATE  LIGAMENT REPAIR Left 07/18/2021   Procedure: LEFT KNEE ANTERIOR CRUCIATE LIGAMENT RECONSTRUCTION, HAMSTRING AUTOGRAFT, MENISCAL DEBRIDEMENT;  Surgeon: Meredith Pel, MD;  Location: [place];  Service: Orthopedics;  Laterality: Left;   BREAST BIOPSY Left 2008   benign   COLONOSCOPY  06/2011   Dr. Britta Mccreedy: per patient it was normal, follow up in 10 years.    ESOPHAGOGASTRODUODENOSCOPY     Dr. Berneta Sages: late 1990s/early 2000   ESOPHAGOGASTRODUODENOSCOPY N/A 05/19/2014   SLF:NO OBVIOUS SOURCE FOR DYSGEUSIA IDENTIFED/MILD Non-erosive gastritis   INCISION AND DRAINAGE Right 07/31/2017   Procedure: INCISION AND DRAINAGE RIGHT THUMB NAIL BED;  Surgeon: Charlotte Crumb, MD;  Location: [place];  Service: Orthopedics;  Laterality: Right;   INGUINAL HERNIA REPAIR     KNEE SURGERY Right    NAILBED REPAIR Right 07/31/2017   Procedure: NAILBED BIOPSY;  Surgeon: Charlotte Crumb, MD;  Location: [place];  Service: Orthopedics;  Laterality: Right;   TONSILLECTOMY AND ADENOIDECTOMY      Current Medications: Current Meds  Medication Sig   Azelastine HCl 137 MCG/SPRAY SOLN Place 2 sprays into the nose 2 (two) times daily as needed.   Biotin 5 MG CAPS Take 5 mg by mouth daily.   cetirizine (ZYRTEC) 10 MG tablet Take 1 tablet by mouth every day   cyclobenzaprine (FLEXERIL) 10 MG tablet Take 1 tablet by mouth every 8 (eight) hours as needed.   diltiazem (CARDIZEM CD) 240 MG 24 hr capsule Take 1 capsule by mouth daily.   diltiazem (CARDIZEM) 30 MG tablet TAKE 1 TABLET BY MOUTH TWICE A DAY AS NEEDED FOR BREAKTHROUGH PALPITATIONS.   EPINEPHrine 0.3 mg/0.3 mL IJ SOAJ injection Use as directed for severe allergic reaction.   ergocalciferol (VITAMIN D2) 1.25 MG (50000 UT) capsule Take 2 capsules by mouth once a week   famotidine (PEPCID) 20 MG tablet Take 1 tablet (20 mg total) by mouth 2 (two) times daily. (Patient taking differently: Take 20 mg by mouth daily.)    fluticasone (FLONASE) 50 MCG/ACT nasal spray USE 2 SPRAY INTO EACH NOSTRIL EVERY DAY   hydrALAZINE (APRESOLINE) 25 MG tablet Take 1 tablet by mouth two times a day   Magnesium 250 MG TABS Take 250 mg by mouth daily.   methocarbamol (ROBAXIN) 500 MG tablet Take 500 mg by mouth every 8 (eight) hours as needed for muscle spasms.   montelukast (SINGULAIR) 10 MG tablet Take 1 tablet by mouth every day   Multiple Vitamins-Minerals (SM COMPLETE 50+) TABS Take 1 tablet by mouth daily.   ondansetron (ZOFRAN) 4 MG tablet Take 1 tablet (4 mg total) by mouth every 4 (four) hours as needed for nausea or vomiting.   pantoprazole (PROTONIX) 40 MG tablet TAKE 1 TABLET BY MOUTH DAILY FOR ACID REFLUX   polyethylene glycol powder (MIRALAX) 17 GM/SCOOP powder DISSOLVE 17 GRAMS IN LIQUID AND DRINK BY MOUTH TWICE DAILY FOR CONSTIP ATION   Potassium Chloride ER 20 MEQ TBCR Take 1 tablet (20 mEq) by mouth daily.   Pyridoxine HCl (VITAMIN B6 PO) Take 1 tablet by mouth daily.  rizatriptan (MAXALT-MLT) 10 MG disintegrating tablet Take 10 mg by mouth as needed for migraine. May repeat in 2 hours if needed      Allergies:   Metoprolol, Shellfish allergy, Flagyl [metronidazole], Gabapentin (once-daily), Lactose intolerance (gi), Losartan, Topamax [topiramate], Ciprofloxacin hcl, Diflucan [fluconazole], and Latex   Social History   Socioeconomic History   Marital status: Single    Spouse name: Not on file   Number of children: Not on file   Years of education: Not on file   Highest education level: Not on file  Occupational History   Occupation: Therapist, sports at [employer]    Employer:   Tobacco Use   Smoking status: Never   Smokeless tobacco: Never  Vaping Use   Vaping Use: Never used  Substance and Sexual Activity   Alcohol use: No   Drug use: No   Sexual activity: Yes    Birth control/protection: Post-menopausal, None  Other Topics Concern   Not on file  Social History Narrative   Not on file   Social  Determinants of Health   Financial Resource Strain: Low Risk  (12/12/2021)   Overall Financial Resource Strain (CARDIA)    Difficulty of Paying Living Expenses: Not hard at all  Food Insecurity: No Food Insecurity (12/12/2021)   Hunger Vital Sign    Worried About Running Out of Food in the Last Year: Never true    Ran Out of Food in the Last Year: Never true  Transportation Needs: No Transportation Needs (12/12/2021)   PRAPARE - Hydrologist (Medical): No    Lack of Transportation (Non-Medical): No  Physical Activity: Insufficiently Active (12/12/2021)   Exercise Vital Sign    Days of Exercise per Week: 3 days    Minutes of Exercise per Session: 30 min  Stress: No Stress Concern Present (12/12/2021)   [place]    Feeling of Stress : Not at all  Social Connections: Moderately Isolated (12/12/2021)   Social Connection and Isolation Panel [NHANES]    Frequency of Communication with Friends and Family: More than three times a week    Frequency of Social Gatherings with Friends and Family: More than three times a week    Attends Religious Services: More than 4 times per year    Active Member of [employer] or Organizations: No    Attends Archivist Meetings: Never    Marital Status: Divorced     Family History:  The patient's family history includes Cancer in her brother and sister; Hypertension in her brother, brother, brother, mother, sister, sister, sister, and sister; Other in her father and mother; Sleep apnea in her brother. There is no history of Colon cancer, Allergic rhinitis, or Asthma.  ROS:   Please see the history of present illness.  All other systems are reviewed and otherwise negative.    EKG(s)/Additional Labs   EKG:  EKG is ordered today, personally reviewed, demonstrating NSR 60bpm, possible LAE, LVH with nonspecific STTW changes similar to prior.   Recent  Labs: 01/27/2022: BUN 13; Creatinine, Ser 0.98; Hemoglobin 14.3; Platelets 280; Potassium 3.6; Sodium 137  Recent Lipid Panel    Component Value Date/Time   CHOL 235 (H) 11/25/2020 0707   TRIG 196 (H) 11/25/2020 0707   HDL 50 11/25/2020 0707   CHOLHDL 4.7 11/25/2020 0707   VLDL 39 11/25/2020 0707   LDLCALC 146 (H) 11/25/2020 0707    PHYSICAL EXAM:    VS:  BP 134/82   Pulse 60   Ht '5\' 3"'$  (1.6 m)   Wt 151 lb 6.4 oz (68.7 kg)   LMP 07/29/2017   SpO2 97%   BMI 26.82 kg/m   BMI: Body mass index is 26.82 kg/m.  GEN: Well nourished, well developed female in no acute distress HEENT: normocephalic, atraumatic Neck: no JVD, carotid bruits, or masses Cardiac: RRR; no murmurs, rubs, or gallops, no edema  Respiratory:  clear to auscultation bilaterally, normal work of breathing GI: soft, nontender, nondistended, + BS MS: no deformity or atrophy Skin: warm and dry, no rash Neuro:  Alert and Oriented x 3, Strength and sensation are intact, follows commands Psych: euthymic mood, full affect  Wt Readings from Last 3 Encounters:  01/29/22 151 lb 6.4 oz (68.7 kg)  01/27/22 150 lb (68 kg)  12/12/21 151 lb (68.5 kg)     ASSESSMENT & PLAN:   1.Chest pain of uncertain etiology - mixed features, recent reassuring ER workup with negative troponin and negative d-dimer. EKG shows NSR with nonspecific STTW changes that are known for the patient. She is largely feeling better but still can feel some ongoing awareness of the discomfort. Given the description that it continues to radiate to her back, I've recommended a stat CT to r/o aortic dissection today. Our office was trying to see if we could arrange for this emergently as OP but staff indicated there would be a delay for approval due to pre-cert issues and being closed for lunch, she was sent to the ER instead. I spoke with Dr. Armandina Gemma EDP to help facilitate the study. I think dissection seems less likely but seems imperative to exclude given her  age, history of hypertension, and persistence of symptoms. VTE seems unlikely given recent normal d-dimer. If dissection study is unrevealing, we had a plan to pursue outpatient coronary CTA to exclude CAD. Baseline HR is 60bpm today. She will be instructed to take her long acting diltiazem the day of the CT and to bring her short-acting diltiazem. She is allergic to metoprolol. She has a shellfish allergy noted but has previously had IV contrast a few years back without notation of allergy at that time. I did notify covering [place] of the patient's plan in the event that cardiology needs to see her for any abnormal findings in the hospital.  2. H/o frequent PVCs - quiescent on diltiazem.  3. Essential HTN - Suboptimal blood pressure control noted today. Will await CTA results before advising further. She does report her diltiazem dose was previously cut back due to bradycardia. We need a better idea of what her BP is running at home. The patient was provided instructions on monitoring blood pressure at home for 1 week and relaying results to our office.    Disposition: F/u with [name] or APP after coronary CTA.   Medication Adjustments/Labs and Tests Ordered: Current medicines are reviewed at length with the patient today.  Concerns regarding medicines are outlined above. Medication changes, Labs and Tests ordered today are summarized above and listed in the Patient Instructions accessible in Encounters.   Signed, Charlie Pitter, PA-C  01/29/2022 11:11 AM    [place] Phone: [phone]; Fax: [phone]

## 2022-01-29 NOTE — ED Triage Notes (Signed)
Patient complains of chest pain since 1 week ago Thursday. Patient sent from clinic for emergent  CT instead of having outpatient. Patient denies pain on arrival

## 2022-01-29 NOTE — ED Provider Notes (Addendum)
  Physical Exam  BP (!) 150/75 (BP Location: Right Arm)   Pulse 63   Temp 98.2 F (36.8 C) (Oral)   Resp 16   LMP 07/29/2017   SpO2 99%     Procedures  Procedures  ED Course / MDM    Medical Decision Making Amount and/or Complexity of Data Reviewed Labs: ordered. Radiology: ordered.   Informed by cardiology outpatient that the patient was sent to the emergency department for rule out dissection given chest pain radiating to the back in the setting of hypertension.  Symptoms have been ongoing for the past week.  Previously been seen in the emergency department with a negative laboratory work-up.  Appropriate orders placed.       Regan Lemming, MD 01/29/22 1515    Regan Lemming, MD 01/29/22 1515

## 2022-01-30 ENCOUNTER — Other Ambulatory Visit (HOSPITAL_COMMUNITY): Payer: Self-pay

## 2022-01-30 ENCOUNTER — Ambulatory Visit (HOSPITAL_COMMUNITY)
Admission: RE | Admit: 2022-01-30 | Discharge: 2022-01-30 | Disposition: A | Discharge status: Home / Self Care | Payer: 59 | Source: Ambulatory Visit | Attending: Physician Assistant | Admitting: Physician Assistant

## 2022-01-30 DIAGNOSIS — I10 Essential (primary) hypertension: Secondary | ICD-10-CM | POA: Diagnosis not present

## 2022-01-30 DIAGNOSIS — I493 Ventricular premature depolarization: Secondary | ICD-10-CM | POA: Diagnosis not present

## 2022-01-30 DIAGNOSIS — I719 Aortic aneurysm of unspecified site, without rupture: Secondary | ICD-10-CM | POA: Diagnosis not present

## 2022-01-30 DIAGNOSIS — R079 Chest pain, unspecified: Secondary | ICD-10-CM | POA: Diagnosis not present

## 2022-01-30 DIAGNOSIS — R0789 Other chest pain: Secondary | ICD-10-CM | POA: Insufficient documentation

## 2022-01-30 DIAGNOSIS — K760 Fatty (change of) liver, not elsewhere classified: Secondary | ICD-10-CM | POA: Diagnosis not present

## 2022-01-30 MED ORDER — IOHEXOL 350 MG/ML SOLN
80.0000 mL | Freq: Once | INTRAVENOUS | Status: AC | PRN
  Administered 2022-01-30: 80 mL via INTRAVENOUS

## 2022-01-30 MED ORDER — FAMOTIDINE 20 MG PO TABS
20.0000 mg | ORAL_TABLET | Freq: Two times a day (BID) | ORAL | 2 refills | Status: AC
  Filled 2022-01-30: qty 90, 45d supply, fill #0
  Filled 2022-04-29: qty 90, 45d supply, fill #1
  Filled 2022-07-08: qty 90, 45d supply, fill #2

## 2022-02-05 ENCOUNTER — Other Ambulatory Visit: Payer: Self-pay

## 2022-02-05 ENCOUNTER — Other Ambulatory Visit (HOSPITAL_COMMUNITY): Payer: Self-pay

## 2022-02-05 DIAGNOSIS — E7849 Other hyperlipidemia: Secondary | ICD-10-CM | POA: Diagnosis not present

## 2022-02-05 DIAGNOSIS — Z6825 Body mass index (BMI) 25.0-25.9, adult: Secondary | ICD-10-CM | POA: Diagnosis not present

## 2022-02-05 DIAGNOSIS — E559 Vitamin D deficiency, unspecified: Secondary | ICD-10-CM | POA: Diagnosis not present

## 2022-02-05 DIAGNOSIS — I10 Essential (primary) hypertension: Secondary | ICD-10-CM | POA: Diagnosis not present

## 2022-02-05 DIAGNOSIS — K760 Fatty (change of) liver, not elsewhere classified: Secondary | ICD-10-CM | POA: Diagnosis not present

## 2022-02-05 DIAGNOSIS — G43909 Migraine, unspecified, not intractable, without status migrainosus: Secondary | ICD-10-CM | POA: Diagnosis not present

## 2022-02-05 DIAGNOSIS — J301 Allergic rhinitis due to pollen: Secondary | ICD-10-CM | POA: Diagnosis not present

## 2022-02-05 MED ORDER — DULOXETINE HCL 20 MG PO CPEP
ORAL_CAPSULE | ORAL | 1 refills | Status: DC
  Filled 2022-02-05: qty 90, 90d supply, fill #0

## 2022-02-09 ENCOUNTER — Ambulatory Visit: Payer: 59 | Admitting: Orthopedic Surgery

## 2022-02-12 NOTE — Progress Notes (Deleted)
Cardiology Office Note    Date:  02/12/2022   ID:  Rose Delgado, DOB 1960-12-20, MRN [id number]   PCP:  Rose Bis, MD   [place]  Cardiologist:  Rose Dolly, MD *** Advanced Practice Provider:  No care team member to display Electrophysiologist:  None   [phone]   No chief complaint on file.   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 She has been treated with diltiazem with follow-up monitor in 2019 showing predominantly NSR with rare PACs/PVCs, no significant arrhythmias. She has a history of SOB following Covid with last echo 08/2020 showing EF >75%, indeterminate diastolic function. This had improved per last OV without specific intervention.   She has been in the ED multiple times with chest pain and most recently sent by Rose Delgado and sent to ED for stat CT. She didn't want to wait so done as an OP. CTA 01/30/22 no aneurysm or dissection.She did have evidence of fatty liver  and told to f/u with PCP. Incidental 4 mm pulmonary nodule RUL and shared origin of right brachiocephalic and left common carotid arteries. She is scheduled for Coronary CTA.  Past Medical History:  Diagnosis Date   Allergic rhinitis    Angio-edema    Fibroids    uterine   GERD (gastroesophageal reflux disease)    Hypertension    Irregular heart beat    Migraines    Perimenopause 03/09/2014   Postmenopausal 2016   Urticaria     Past Surgical History:  Procedure Laterality Date   ANTERIOR CRUCIATE LIGAMENT REPAIR Left 07/18/2021   Procedure: LEFT KNEE ANTERIOR CRUCIATE LIGAMENT RECONSTRUCTION, HAMSTRING AUTOGRAFT, MENISCAL DEBRIDEMENT;  Surgeon: Meredith Pel, MD;  Location: [place];  Service: Orthopedics;  Laterality: Left;   BREAST BIOPSY Left 2008   benign   COLONOSCOPY  06/2011   Dr. Britta Mccreedy:  per patient it was normal, follow up in 10 years.    ESOPHAGOGASTRODUODENOSCOPY     Dr. Berneta Sages: late 1990s/early 2000   ESOPHAGOGASTRODUODENOSCOPY N/A 05/19/2014   SLF:NO OBVIOUS SOURCE FOR DYSGEUSIA IDENTIFED/MILD Non-erosive gastritis   INCISION AND DRAINAGE Right 07/31/2017   Procedure: INCISION AND DRAINAGE RIGHT THUMB NAIL BED;  Surgeon: Charlotte Crumb, MD;  Location: [place];  Service: Orthopedics;  Laterality: Right;   INGUINAL HERNIA REPAIR     KNEE SURGERY Right    NAILBED REPAIR Right 07/31/2017   Procedure: NAILBED BIOPSY;  Surgeon: Charlotte Crumb, MD;  Location: [place];  Service: Orthopedics;  Laterality: Right;   TONSILLECTOMY AND ADENOIDECTOMY      Current Medications: No outpatient medications have been marked as taking for the 02/19/22 encounter (Appointment) with Imogene Burn, PA-C.     Allergies:   Metoprolol, Shellfish allergy, Flagyl [metronidazole], Gabapentin (once-daily), Lactose intolerance (gi), Losartan, Topamax [topiramate], Ciprofloxacin hcl, Diflucan [fluconazole], and Latex   Social History   Socioeconomic History   Marital status: Single    Spouse name: Not on file   Number of children: Not on file   Years of education: Not on file   Highest education level: Not on file  Occupational History   Occupation: RN at [employer]    Employer: [employer]  Tobacco Use   Smoking status: Never   Smokeless tobacco: Never  Vaping Use   Vaping  Use: Never used  Substance and Sexual Activity   Alcohol use: No   Drug use: No   Sexual activity: Yes    Birth control/protection: Post-menopausal, None  Other Topics Concern   Not on file  Social History Narrative   Not on file   Social Determinants of Health   Financial Resource Strain: Low Risk  (12/12/2021)   Overall Financial Resource Strain (CARDIA)    Difficulty of Paying Living Expenses: Not hard at all  Food Insecurity: No Food Insecurity (12/12/2021)    Hunger Vital Sign    Worried About Running Out of Food in the Last Year: Never true    Ran Out of Food in the Last Year: Never true  Transportation Needs: No Transportation Needs (12/12/2021)   PRAPARE - Hydrologist (Medical): No    Lack of Transportation (Non-Medical): No  Physical Activity: Insufficiently Active (12/12/2021)   Exercise Vital Sign    Days of Exercise per Week: 3 days    Minutes of Exercise per Session: 30 min  Stress: No Stress Concern Present (12/12/2021)   [place]    Feeling of Stress : Not at all  Social Connections: Moderately Isolated (12/12/2021)   Social Connection and Isolation Panel [NHANES]    Frequency of Communication with Friends and Family: More than three times a week    Frequency of Social Gatherings with Friends and Family: More than three times a week    Attends Religious Services: More than 4 times per year    Active Member of [employer] or Organizations: No    Attends Archivist Meetings: Never    Marital Status: Divorced     Family History:  The patient's ***family history includes Cancer in her brother and sister; Hypertension in her brother, brother, brother, mother, sister, sister, sister, and sister; Other in her father and mother; Sleep apnea in her brother.   ROS:   Please see the history of present illness.    ROS All other systems reviewed and are negative.   PHYSICAL EXAM:   VS:  LMP 07/29/2017   Physical Exam  GEN: Well nourished, well developed, in no acute distress  HEENT: normal  Neck: no JVD, carotid bruits, or masses Cardiac:RRR; no murmurs, rubs, or gallops  Respiratory:  clear to auscultation bilaterally, normal work of breathing GI: soft, nontender, nondistended, + BS Ext: without cyanosis, clubbing, or edema, Good distal pulses bilaterally MS: no deformity or atrophy  Skin: warm and dry, no rash Neuro:  Alert and  Oriented x 3, Strength and sensation are intact Psych: euthymic mood, full affect  Wt Readings from Last 3 Encounters:  01/29/22 151 lb 6.4 oz (68.7 kg)  01/27/22 150 lb (68 kg)  12/12/21 151 lb (68.5 kg)      Studies/Labs Reviewed:   EKG:  EKG is*** ordered today.  The ekg ordered today demonstrates ***  Recent Labs: 01/29/2022: ALT 69; BUN 9; Creatinine, Ser 0.83; Hemoglobin 13.8; Platelets 277; Potassium 4.3; Sodium 139   Lipid Panel    Component Value Date/Time   CHOL 235 (H) 11/25/2020 0707   TRIG 196 (H) 11/25/2020 0707   HDL 50 11/25/2020 0707   CHOLHDL 4.7 11/25/2020 0707   VLDL 39 11/25/2020 0707   LDLCALC 146 (H) 11/25/2020 0707    Additional studies/ records that were reviewed today include:   CTA chest 01/30/22 Cardiovascular: Preferential opacification of the thoracic aorta. No evidence of thoracic  aortic aneurysm or dissection. Note is made of a shared origin of the right brachiocephalic and left common carotid arteries. The pulmonary arteries are normal in caliber without central filling defect. Normal heart size. No pericardial effusion.   Mediastinum/Nodes: No enlarged mediastinal, hilar, or axillary lymph nodes. The thyroid gland appears normal.   Lungs/Pleura: No pleural effusion. No pneumothorax. No mass or focal consolidation. Small 4 mm pulmonary nodule in the right upper lobe requires no further follow-up, likely intra-fissural lymph node. No suspicious pulmonary nodules.   CTA ABDOMEN AND PELVIS   VASCULAR   Aorta: Normal caliber aorta without aneurysm, dissection, vasculitis or significant stenosis.   Celiac: Patent without evidence of aneurysm, dissection, vasculitis or significant stenosis.   SMA: Patent without evidence of aneurysm, dissection, vasculitis or significant stenosis.   IMA: Patent.   Renals: Both renal arteries are patent without evidence of aneurysm, dissection, vasculitis, fibromuscular dysplasia or  significant stenosis.   Inflow: Patent without evidence of aneurysm, dissection, vasculitis or significant stenosis.   Veins: No obvious venous abnormality within the limitations of this arterial phase study. The portal venous system is patent. The systemic veins are unremarkable.   NON-VASCULAR   Hepatobiliary: Hepatic steatosis. Liver is otherwise normal in size without focal lesion. No intrahepatic or extrahepatic biliary ductal dilation. The gallbladder appears normal.   Spleen: Normal in size without focal abnormality.   Pancreas: No pancreatic ductal dilatation or surrounding inflammatory changes.   Adrenals/Urinary Tract: Adrenal glands are unremarkable. Kidneys are normal, without renal calculi, focal lesion, or hydronephrosis. Bladder is unremarkable.   Stomach/Bowel: The stomach, small bowel and large bowel are normal in caliber without abnormal wall thickening or surrounding inflammatory changes.   Reproductive: Uterus and bilateral adnexa are unremarkable.   Lymphatic: No enlarged lymph nodes in the abdomen or pelvis.   Other: No abdominopelvic ascites.   Musculoskeletal: No aggressive osseous lesions. The soft tissues are unremarkable.   Review of the MIP images confirms the above findings.   IMPRESSION: Vascular:   1. No findings of thoracic or abdominal aortic aneurysm or dissection. No acute vascular pathology in the chest, abdomen or pelvis.   Nonvascular:   1. No acute findings in the chest, abdomen or pelvis. 2. Hepatic steatosis.     Electronically Signed   By: Albin Felling M.D.   On: 01/30/2022 09:17  2d echo 08/2020    1. Left ventricular ejection fraction, by estimation, is >75%. The left  ventricle has normal function. The left ventricle has no regional wall  motion abnormalities. Left ventricular diastolic parameters are  indeterminate. The average left ventricular  global longitudinal strain is -19.0 %. The global longitudinal  strain is  normal.   2. Right ventricular systolic function is normal. The right ventricular  size is normal.   3. The mitral valve is normal in structure. No evidence of mitral valve  regurgitation. No evidence of mitral stenosis.   4. The aortic valve has an indeterminant number of cusps. Aortic valve  regurgitation is not visualized. No aortic stenosis is present.   5. The inferior vena cava is normal in size with greater than 50%  respiratory variability, suggesting right atrial pressure of 3 mmHg.   Comparison(s): Echocardiogram done 09/12/16 showed an EF of 65-70%.    Monitor 2019 Sinus rhythm was seen. No arrhythmias   Monitor 2018 Sinus rhythm with frequent PVC's (17% of all beats). No diary was returned.     Risk Assessment/Calculations:   {Does this patient have  ATRIAL FIBRILLATION?:[phone]}     ASSESSMENT:    No diagnosis found.   PLAN:  In order of problems listed above:  Chest pain chest CTA 01/30/22 no aneurysm or dissection.She did have evidence of fatty liver  and told to f/u with PCP. Incidental 4 mm pulmonary nodule RUL and shared origin of right brachiocephalic and left common carotid arteries. She is scheduled for Coronary CTA.  PVC's on diltiazem  HTN  Shared Decision Making/Informed Consent   {Are you ordering a CV Procedure (e.g. stress test, cath, DCCV, TEE, etc)?   Press F2        :[id number]}    Medication Adjustments/Labs and Tests Ordered: Current medicines are reviewed at length with the patient today.  Concerns regarding medicines are outlined above.  Medication changes, Labs and Tests ordered today are listed in the Patient Instructions below. There are no Patient Instructions on file for this visit.   Signed, Ermalinda Barrios, PA-C  02/12/2022 1:12 PM    [facility] [address], [place], [place]  [address] Phone: [phone]; Fax: [phone]

## 2022-02-13 ENCOUNTER — Encounter: Payer: Self-pay | Admitting: Cardiology

## 2022-02-13 ENCOUNTER — Ambulatory Visit: Payer: 59 | Admitting: Student

## 2022-02-16 ENCOUNTER — Encounter (HOSPITAL_COMMUNITY): Payer: Self-pay

## 2022-02-16 ENCOUNTER — Other Ambulatory Visit: Payer: Self-pay | Admitting: Family Medicine

## 2022-02-16 ENCOUNTER — Other Ambulatory Visit (HOSPITAL_COMMUNITY): Payer: Self-pay

## 2022-02-16 ENCOUNTER — Ambulatory Visit (HOSPITAL_COMMUNITY): Payer: 59

## 2022-02-16 MED ORDER — DILTIAZEM HCL 30 MG PO TABS
30.0000 mg | ORAL_TABLET | Freq: Two times a day (BID) | ORAL | 3 refills | Status: AC | PRN
  Filled 2022-02-16: qty 60, 30d supply, fill #0

## 2022-02-16 NOTE — Telephone Encounter (Signed)
I think if no recurrent symptoms can hold on CT for now. If recurrence let us know and we would reorder  Zandra Abts MD

## 2022-02-19 ENCOUNTER — Ambulatory Visit: Payer: 59 | Admitting: Physician Assistant

## 2022-02-19 DIAGNOSIS — I10 Essential (primary) hypertension: Secondary | ICD-10-CM

## 2022-02-19 DIAGNOSIS — I493 Ventricular premature depolarization: Secondary | ICD-10-CM

## 2022-02-19 DIAGNOSIS — R079 Chest pain, unspecified: Secondary | ICD-10-CM

## 2022-03-05 ENCOUNTER — Other Ambulatory Visit (HOSPITAL_COMMUNITY): Payer: Self-pay

## 2022-03-06 ENCOUNTER — Other Ambulatory Visit (HOSPITAL_COMMUNITY): Payer: Self-pay

## 2022-04-06 ENCOUNTER — Encounter: Payer: Self-pay | Admitting: Orthopedic Surgery

## 2022-04-06 ENCOUNTER — Ambulatory Visit (INDEPENDENT_AMBULATORY_CARE_PROVIDER_SITE_OTHER): Payer: 59 | Admitting: Orthopedic Surgery

## 2022-04-06 DIAGNOSIS — R202 Paresthesia of skin: Secondary | ICD-10-CM

## 2022-04-06 NOTE — Progress Notes (Signed)
Post-Op Visit Note   Patient: Rose Delgado           Date of Birth: [birth year]           MRN: [id number] Visit Date: 04/06/2022 PCP: Caryl Bis, MD   Assessment & Plan:  Chief Complaint:  Chief Complaint  Patient presents with   Left Leg - Follow-up    Review EMG/NCV knee anterior cruciate ligament reconstruction with hamstring autograft and meniscal debridement on 07/18/2021.   Visit Diagnoses:  1. Paresthesia of skin     Plan: Patient presents now about 10 months out left knee ACL reconstruction with hamstring autograft and meniscal debridement.  Since she was last seen she has had an EMG nerve study which does show saphenous neuritis.  Her area of numbness does extend above the knee.  She is not having much in the way of of lateral sided radicular pain.  She is having some low back pain.  Not taking medication.  Tried Neurontin but that did not help.  She has done some exercising which has helped decrease the paresthesias some.  On examination she has excellent range of motion of the left knee.  Equivocal Tinel's around the hamstring harvest site proximal medial tibia.  Does have paresthesias above the knee.  Graft is stable with no effusion in the knee and full range of motion.  Impression is saphenous neuritis left leg which may be related to the block or potentially could be related to the hamstring tendon harvest.  Plan at this time is observation with continued aerobic exercise.  Follow-up in 2-1/2 months for final clinical check.  No color changes or temperature differences left leg versus right leg.  Follow-Up Instructions: No follow-ups on file.   Orders:  No orders of the defined types were placed in this encounter.  No orders of the defined types were placed in this encounter.   Imaging: No results found.  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 02/16/2016   Nausea with vomiting 07/14/2014   Dysgeusia 03/11/2014   GERD (gastroesophageal reflux disease) 03/11/2014   Hot flashes 03/09/2014   Perimenopause 03/09/2014   Fibroids 03/09/2014   Past Medical History:  Diagnosis Date   Allergic rhinitis    Angio-edema    Fibroids    uterine   GERD (gastroesophageal reflux disease)    Hypertension    Irregular heart beat    Migraines    Perimenopause 03/09/2014   Postmenopausal 2016   Urticaria     Family History  Problem Relation Age of Onset   Other Father        aneursym   Other Mother        glaucoma; pacemaker   Hypertension Mother    Cancer Brother        lung   Hypertension Brother    Sleep apnea Brother    Hypertension Brother    Hypertension Brother  Cancer Sister        biliary, deceased age 60   Hypertension Sister    Hypertension Sister    Hypertension Sister    Hypertension Sister    Colon cancer Neg Hx    Allergic rhinitis Neg Hx    Asthma Neg Hx     Past Surgical History:  Procedure Laterality Date   ANTERIOR CRUCIATE LIGAMENT REPAIR Left 07/18/2021   Procedure: LEFT KNEE ANTERIOR CRUCIATE LIGAMENT RECONSTRUCTION, HAMSTRING AUTOGRAFT, MENISCAL DEBRIDEMENT;  Surgeon: Meredith Pel, MD;  Location: [place];  Service: Orthopedics;  Laterality: Left;   BREAST BIOPSY Left 2008   benign   COLONOSCOPY  06/2011   Dr. Britta Mccreedy: per patient it was normal, follow up in 10 years.    ESOPHAGOGASTRODUODENOSCOPY      Dr. Berneta Sages: late 1990s/early 2000   ESOPHAGOGASTRODUODENOSCOPY N/A 05/19/2014   SLF:NO OBVIOUS SOURCE FOR DYSGEUSIA IDENTIFED/MILD Non-erosive gastritis   INCISION AND DRAINAGE Right 07/31/2017   Procedure: INCISION AND DRAINAGE RIGHT THUMB NAIL BED;  Surgeon: Charlotte Crumb, MD;  Location: [place];  Service: Orthopedics;  Laterality: Right;   INGUINAL HERNIA REPAIR     KNEE SURGERY Right    NAILBED REPAIR Right 07/31/2017   Procedure: NAILBED BIOPSY;  Surgeon: Charlotte Crumb, MD;  Location: [place];  Service: Orthopedics;  Laterality: Right;   TONSILLECTOMY AND ADENOIDECTOMY     Social History   Occupational History   Occupation: Therapist, sports at [employer]: [employer]  Tobacco Use   Smoking status: Never   Smokeless tobacco: Never  Vaping Use   Vaping Use: Never used  Substance and Sexual Activity   Alcohol use: No   Drug use: No   Sexual activity: Yes    Birth control/protection: Post-menopausal, None

## 2022-04-23 ENCOUNTER — Other Ambulatory Visit (HOSPITAL_COMMUNITY): Payer: Self-pay

## 2022-04-30 ENCOUNTER — Other Ambulatory Visit (HOSPITAL_COMMUNITY): Payer: Self-pay

## 2022-05-08 DIAGNOSIS — M9901 Segmental and somatic dysfunction of cervical region: Secondary | ICD-10-CM | POA: Diagnosis not present

## 2022-05-08 DIAGNOSIS — M9903 Segmental and somatic dysfunction of lumbar region: Secondary | ICD-10-CM | POA: Diagnosis not present

## 2022-05-08 DIAGNOSIS — M9902 Segmental and somatic dysfunction of thoracic region: Secondary | ICD-10-CM | POA: Diagnosis not present

## 2022-05-08 DIAGNOSIS — M9906 Segmental and somatic dysfunction of lower extremity: Secondary | ICD-10-CM | POA: Diagnosis not present

## 2022-05-23 ENCOUNTER — Encounter: Payer: Self-pay | Admitting: Cardiology

## 2022-05-23 ENCOUNTER — Ambulatory Visit: Payer: 59 | Attending: Cardiology | Admitting: Cardiology

## 2022-05-23 VITALS — BP 138/70 | HR 68 | Ht 63.0 in | Wt 154.0 lb

## 2022-05-23 DIAGNOSIS — R002 Palpitations: Secondary | ICD-10-CM

## 2022-05-23 DIAGNOSIS — I10 Essential (primary) hypertension: Secondary | ICD-10-CM | POA: Diagnosis not present

## 2022-05-23 DIAGNOSIS — R079 Chest pain, unspecified: Secondary | ICD-10-CM

## 2022-05-23 NOTE — Progress Notes (Signed)
Clinical Summary Rose Delgado is a 61 y.o.female seen today for follow up of the following medical problems.      Palpitations -prior monitoring showed no significant arrhythmias  - no recent symptoms. Has not not needed prn diltiazem   - no recent palpitations            2.HTN - she is compliant with meds     3.SOB -last visit reported ongoing SOB since she had covid in 2020 - 08/2020 echo LVEF >75%, no WMAs, indet diastolic, normal RV     4. Chest pain - 12/2021 ER visit with chest pain. Negative workup for ACS, Ddimer neg - 01/2022 CTA negative for acute aortic pathology - coronary CTA was ordered by not completed. Symptoms had resolved so holding off  - still some pain times - under left breast, heaviness. Typically with activity, up to 8/10 in severity. +fatigue. Can be  -sharp pain in back. Not positional piain in chest but can be in back. . Pain can last about 2 hours. Tylenol muscle relaxer seem help.  - walks 3 times a day. Uses treadmill at home x44mn - 1hrs, usually fast walk.   CAD risk factors: HTN, thinks father had heart disease unsure  Chronic knee pain, not able to run high speeds on treadmill.    5. Lung nodule - small 4 mm, from reprot does not require monitoring.    SH: she works in [employer] Past Medical History:  Diagnosis Date   Allergic rhinitis    Angio-edema    Fibroids    uterine   GERD (gastroesophageal reflux disease)    Hypertension    Irregular heart beat    Migraines    Perimenopause 03/09/2014   Postmenopausal 2016   Urticaria      Allergies  Allergen Reactions   Metoprolol Anaphylaxis   Shellfish Allergy Anaphylaxis   Flagyl [Metronidazole] Nausea And Vomiting   Gabapentin (Once-Daily) Other (See Comments)   Lactose Intolerance (Gi) Nausea And Vomiting    And diarrhea   Losartan Swelling   Topamax [Topiramate] Other (See Comments)    "feeling of doom"   Ciprofloxacin Hcl Nausea Only   Diflucan  [Fluconazole] Nausea And Vomiting   Latex Rash     Current Outpatient Medications  Medication Sig Dispense Refill   Azelastine HCl 137 MCG/SPRAY SOLN Place 2 sprays into the nose 2 (two) times daily as needed. 90 mL 3   Biotin 5 MG CAPS Take 5 mg by mouth daily.     cetirizine (ZYRTEC) 10 MG tablet Take 1 tablet by mouth every day 90 tablet 1   cyclobenzaprine (FLEXERIL) 10 MG tablet Take 1 tablet by mouth every 8 (eight) hours as needed.  0   diltiazem (CARDIZEM CD) 240 MG 24 hr capsule Take 1 capsule by mouth daily. 90 capsule 3   diltiazem (CARDIZEM) 30 MG tablet Take 1 tablet by mouth 2 times daily as needed (for breakthrough palpitations). 60 tablet 3   DULoxetine (CYMBALTA) 20 MG capsule Take 1 Capsule by mouth every day for nerve pain 90 capsule 1   EPINEPHrine 0.3 mg/0.3 mL IJ SOAJ injection Use as directed for severe allergic reaction. 2 each 2   ergocalciferol (VITAMIN D2) 1.25 MG (50000 UT) capsule Take 2 capsules by mouth once a week 24 capsule 4   famotidine (PEPCID) 20 MG tablet Take 1 tablet (20 mg total) by mouth 2 (two) times daily. 90 tablet 2   fluticasone (  FLONASE) 50 MCG/ACT nasal spray USE 2 SPRAY INTO EACH NOSTRIL EVERY DAY 48 g 3   hydrALAZINE (APRESOLINE) 25 MG tablet Take 1 tablet by mouth two times a day 180 tablet 3   Magnesium 250 MG TABS Take 250 mg by mouth daily.     methocarbamol (ROBAXIN) 500 MG tablet Take 500 mg by mouth every 8 (eight) hours as needed for muscle spasms.     montelukast (SINGULAIR) 10 MG tablet Take 1 tablet by mouth every day 90 tablet 3   Multiple Vitamins-Minerals (SM COMPLETE 50+) TABS Take 1 tablet by mouth daily.     ondansetron (ZOFRAN) 4 MG tablet Take 1 tablet (4 mg total) by mouth every 4 (four) hours as needed for nausea or vomiting. 30 tablet 2   pantoprazole (PROTONIX) 40 MG tablet TAKE 1 TABLET BY MOUTH DAILY FOR ACID REFLUX 90 tablet 1   Potassium Chloride ER 20 MEQ TBCR Take 1 tablet (20 mEq) by mouth daily. 90 tablet 3    Pyridoxine HCl (VITAMIN B6 PO) Take 1 tablet by mouth daily.     rizatriptan (MAXALT-MLT) 10 MG disintegrating tablet Take 10 mg by mouth as needed for migraine. May repeat in 2 hours if needed     No current facility-administered medications for this visit.     Past Surgical History:  Procedure Laterality Date   ANTERIOR CRUCIATE LIGAMENT REPAIR Left 07/18/2021   Procedure: LEFT KNEE ANTERIOR CRUCIATE LIGAMENT RECONSTRUCTION, HAMSTRING AUTOGRAFT, MENISCAL DEBRIDEMENT;  Surgeon: Meredith Pel, MD;  Location: [place];  Service: Orthopedics;  Laterality: Left;   BREAST BIOPSY Left 2008   benign   COLONOSCOPY  06/2011   Dr. Britta Mccreedy: per patient it was normal, follow up in 10 years.    ESOPHAGOGASTRODUODENOSCOPY     Dr. Berneta Sages: late 1990s/early 2000   ESOPHAGOGASTRODUODENOSCOPY N/A 05/19/2014   SLF:NO OBVIOUS SOURCE FOR DYSGEUSIA IDENTIFED/MILD Non-erosive gastritis   INCISION AND DRAINAGE Right 07/31/2017   Procedure: INCISION AND DRAINAGE RIGHT THUMB NAIL BED;  Surgeon: Charlotte Crumb, MD;  Location: [place];  Service: Orthopedics;  Laterality: Right;   INGUINAL HERNIA REPAIR     KNEE SURGERY Right    NAILBED REPAIR Right 07/31/2017   Procedure: NAILBED BIOPSY;  Surgeon: Charlotte Crumb, MD;  Location: [place];  Service: Orthopedics;  Laterality: Right;   TONSILLECTOMY AND ADENOIDECTOMY       Allergies  Allergen Reactions   Metoprolol Anaphylaxis   Shellfish Allergy Anaphylaxis   Flagyl [Metronidazole] Nausea And Vomiting   Gabapentin (Once-Daily) Other (See Comments)   Lactose Intolerance (Gi) Nausea And Vomiting    And diarrhea   Losartan Swelling   Topamax [Topiramate] Other (See Comments)    "feeling of doom"   Ciprofloxacin Hcl Nausea Only   Diflucan [Fluconazole] Nausea And Vomiting   Latex Rash      Family History  Problem Relation Age of Onset   Other Father        aneursym   Other Mother        glaucoma; pacemaker    Hypertension Mother    Cancer Brother        lung   Hypertension Brother    Sleep apnea Brother    Hypertension Brother    Hypertension Brother    Cancer Sister        biliary, deceased age 30   Hypertension Sister    Hypertension Sister    Hypertension Sister    Hypertension Sister  Colon cancer Neg Hx    Allergic rhinitis Neg Hx    Asthma Neg Hx      Social History Rose Delgado reports that she has never smoked. She has never used smokeless tobacco. Rose Delgado reports no history of alcohol use.   Review of Systems CONSTITUTIONAL: No weight loss, fever, chills, weakness or fatigue.  HEENT: Eyes: No visual loss, blurred vision, double vision or yellow sclerae.No hearing loss, sneezing, congestion, runny nose or sore throat.  SKIN: No rash or itching.  CARDIOVASCULAR: per hpi RESPIRATORY: No shortness of breath, cough or sputum.  GASTROINTESTINAL: No anorexia, nausea, vomiting or diarrhea. No abdominal pain or blood.  GENITOURINARY: No burning on urination, no polyuria NEUROLOGICAL: No headache, dizziness, syncope, paralysis, ataxia, numbness or tingling in the extremities. No change in bowel or bladder control.  MUSCULOSKELETAL: No muscle, back pain, joint pain or stiffness.  LYMPHATICS: No enlarged nodes. No history of splenectomy.  PSYCHIATRIC: No history of depression or anxiety.  ENDOCRINOLOGIC: No reports of sweating, cold or heat intolerance. No polyuria or polydipsia.  Marland Kitchen   Physical Examination Today's Vitals   05/23/22 1529  BP: 138/70  Pulse: 68  Weight: 154 lb (69.9 kg)  Height: '5\' 3"'$  (1.6 m)   Body mass index is 27.28 kg/m.  Gen: resting comfortably, no acute distress HEENT: no scleral icterus, pupils equal round and reactive, no palptable cervical adenopathy,  CV: RRR, no m/r/g no jvd Resp: Clear to auscultation bilaterally GI: abdomen is soft, non-tender, non-distended, normal bowel sounds, no hepatosplenomegaly MSK: extremities are warm, no  edema.  Skin: warm, no rash Neuro:  no focal deficits Psych: appropriate affect   Diagnostic Studies 01/2022 CTA  IMPRESSION: Vascular:   1. No findings of thoracic or abdominal aortic aneurysm or dissection. No acute vascular pathology in the chest, abdomen or pelvis.   Nonvascular:   1. No acute findings in the chest, abdomen or pelvis. 2. Hepatic steatosis.   Assessment and Plan  Palpitations -doing well on diltiazem, we will continue  2. HTN - essentailly at goal, continue current meds  3. Cheat pain - recurrent symptoms. They are mixed in description - will go ahead and schedule her previously ordered coronary CTA - metoprolol allergy with anaphylaxis listed though she reports just caused HRs to get too low. She will take her AM diltiazem '240mg'$  the day of the test and bring her prn diltiazem with her - cannot run on treadmill due to chronic knee pains.       Arnoldo Lenis, M.D.

## 2022-05-23 NOTE — Patient Instructions (Addendum)
Medication Instructions:  Your physician recommends that you continue on your current medications as directed. Please refer to the Current Medication list given to you today.   Labwork: BMET CBC  Testing/Procedures: Coronary CTA  Follow-Up: Follow up with [name] in 6 months.   Any Other Special Instructions Will Be Listed Below (If Applicable).     If you need a refill on your cardiac medications before your next appointment, please call your pharmacy.    Your cardiac CT will be scheduled at one of the below locations:   [facility] [address] [place], [place] [address] (336) [place] [address] [place], [place] [address] [phone]  [facility] [place], [place] [address] [phone]  If scheduled at [facility], please arrive at the [facility] and Children's Entrance (Entrance C2) of [facility] 30 minutes prior to test start time. You can use the FREE valet parking offered at entrance C (encouraged to control the heart rate for the test)  Proceed to the [facility] Radiology Department (first floor) to check-in and test prep.  All radiology patients and guests should use entrance C2 at [facility], accessed from [facility], even though the hospital's physical address listed is [address]    If scheduled at [facility] or [facility], please arrive 15 mins early for check-in and test prep.   Please follow these instructions carefully (unless otherwise directed):   On the Night Before the Test: Be sure to Drink plenty of water. Do not consume any caffeinated/decaffeinated beverages or chocolate 12 hours prior to your test. Do not take any antihistamines 12 hours prior to your test.  On the Day of the Test: Drink plenty of water  until 1 hour prior to the test. Do not eat any food 1 hour prior to test. You may take your regular medications prior to the test.  Take morning Diltiazem two hours prior to test. HOLD Furosemide/Hydrochlorothiazide morning of the test. FEMALES- please wear underwire-free bra if available, avoid dresses & tight clothing Bring your short acting Diltiazem with you to the test.       After the Test: Drink plenty of water. After receiving IV contrast, you may experience a mild flushed feeling. This is normal. On occasion, you may experience a mild rash up to 24 hours after the test. This is not dangerous. If this occurs, you can take Benadryl 25 mg and increase your fluid intake. If you experience trouble breathing, this can be serious. If it is severe call 911 IMMEDIATELY. If it is mild, please call our office. If you take any of these medications: Glipizide/Metformin, Avandament, Glucavance, please do not take 48 hours after completing test unless otherwise instructed.  We will call to schedule your test 2-4 weeks out understanding that some insurance companies will need an authorization prior to the service being performed.   For non-scheduling related questions, please contact the cardiac imaging nurse navigator should you have any questions/concerns: [name], Cardiac Imaging Nurse Navigator Gordy Clement, Cardiac Imaging Nurse Navigator [facility] Direct Office Dial: [phone]   For scheduling needs, including cancellations and rescheduling, please call [place], [phone].

## 2022-05-23 NOTE — Addendum Note (Signed)
Addended by: Christella Scheuermann C on: 05/23/2022 04:10 PM   Modules accepted: Orders

## 2022-05-29 ENCOUNTER — Other Ambulatory Visit (HOSPITAL_COMMUNITY)
Admission: RE | Admit: 2022-05-29 | Discharge: 2022-05-29 | Disposition: A | Discharge status: Home / Self Care | Payer: 59 | Source: Ambulatory Visit | Attending: Cardiology | Admitting: Cardiology

## 2022-05-29 DIAGNOSIS — R079 Chest pain, unspecified: Secondary | ICD-10-CM | POA: Diagnosis not present

## 2022-05-29 LAB — CBC
HCT: 42.8 % (ref 36.0–46.0)
Hemoglobin: 14.2 g/dL (ref 12.0–15.0)
MCH: 30.7 pg (ref 26.0–34.0)
MCHC: 33.2 g/dL (ref 30.0–36.0)
MCV: 92.6 fL (ref 80.0–100.0)
Platelets: 284 10*3/uL (ref 150–400)
RBC: 4.62 MIL/uL (ref 3.87–5.11)
RDW: 12.5 % (ref 11.5–15.5)
WBC: 7.4 10*3/uL (ref 4.0–10.5)
nRBC: 0 % (ref 0.0–0.2)

## 2022-05-29 LAB — BASIC METABOLIC PANEL
Anion gap: 7 (ref 5–15)
BUN: 11 mg/dL (ref 8–23)
CO2: 24 mmol/L (ref 22–32)
Calcium: 9.9 mg/dL (ref 8.9–10.3)
Chloride: 107 mmol/L (ref 98–111)
Creatinine, Ser: 0.84 mg/dL (ref 0.44–1.00)
GFR, Estimated: >60 mL/min (ref >60)
Glucose, Bld: 100 mg/dL — ABNORMAL HIGH (ref 70–99)
Potassium: 4 mmol/L (ref 3.5–5.1)
Sodium: 138 mmol/L (ref 135–145)

## 2022-06-05 ENCOUNTER — Other Ambulatory Visit (HOSPITAL_COMMUNITY): Payer: Self-pay | Admitting: Obstetrics & Gynecology

## 2022-06-05 DIAGNOSIS — Z1231 Encounter for screening mammogram for malignant neoplasm of breast: Secondary | ICD-10-CM

## 2022-06-06 ENCOUNTER — Telehealth (HOSPITAL_COMMUNITY): Payer: Self-pay | Admitting: Emergency Medicine

## 2022-06-06 ENCOUNTER — Ambulatory Visit (HOSPITAL_COMMUNITY): Payer: 59

## 2022-06-06 NOTE — Telephone Encounter (Signed)
Reaching out to patient to offer assistance regarding upcoming cardiac imaging study; pt verbalizes understanding of appt date/time, parking situation and where to check in, pre-test NPO status and medications ordered, and verified current allergies; name and call back number provided for further questions should they arise [name] RN Navigator Cardiac Imaging [facility] [phone] office [phone] cell  Arrival 1100 Daily meds - HR 60-68 Denies iv issues Aware contrast/nitro

## 2022-06-07 ENCOUNTER — Other Ambulatory Visit: Payer: Self-pay

## 2022-06-07 ENCOUNTER — Ambulatory Visit (HOSPITAL_COMMUNITY)
Admission: RE | Admit: 2022-06-07 | Discharge: 2022-06-07 | Disposition: A | Discharge status: Home / Self Care | Payer: 59 | Source: Ambulatory Visit | Attending: Cardiology | Admitting: Cardiology

## 2022-06-07 DIAGNOSIS — R079 Chest pain, unspecified: Secondary | ICD-10-CM | POA: Insufficient documentation

## 2022-06-07 MED ORDER — DILTIAZEM HCL 25 MG/5ML IV SOLN: 5.0000 mg | Freq: Once | INTRAVENOUS | Status: AC

## 2022-06-07 MED ORDER — DILTIAZEM HCL 25 MG/5ML IV SOLN
INTRAVENOUS | Status: AC
  Administered 2022-06-07: 5 mg
  Filled 2022-06-07: qty 5

## 2022-06-07 MED ORDER — IOHEXOL 350 MG/ML SOLN
95.0000 mL | Freq: Once | INTRAVENOUS | Status: AC | PRN
  Administered 2022-06-07: 95 mL via INTRAVENOUS

## 2022-06-07 MED ORDER — NITROGLYCERIN 0.4 MG SL SUBL
0.8000 mg | SUBLINGUAL_TABLET | Freq: Once | SUBLINGUAL | Status: AC
  Administered 2022-06-07: 0.8 mg via SUBLINGUAL

## 2022-06-07 MED ORDER — METOPROLOL TARTRATE 5 MG/5ML IV SOLN
INTRAVENOUS | Status: AC
  Filled 2022-06-07: qty 5

## 2022-06-07 MED ORDER — NITROGLYCERIN 0.4 MG SL SUBL
SUBLINGUAL_TABLET | SUBLINGUAL | Status: AC
  Filled 2022-06-07: qty 2

## 2022-06-08 ENCOUNTER — Other Ambulatory Visit (HOSPITAL_COMMUNITY): Payer: Self-pay

## 2022-06-08 ENCOUNTER — Other Ambulatory Visit: Payer: Self-pay | Admitting: Student

## 2022-06-08 MED ORDER — PANTOPRAZOLE SODIUM 40 MG PO TBEC
DELAYED_RELEASE_TABLET | ORAL | 1 refills | Status: DC
  Filled 2022-06-08: qty 90, 90d supply, fill #0
  Filled 2022-09-30: qty 90, 90d supply, fill #1

## 2022-06-08 MED ORDER — POTASSIUM CHLORIDE ER 20 MEQ PO TBCR
20.0000 meq | EXTENDED_RELEASE_TABLET | Freq: Every day | ORAL | 3 refills | Status: DC
  Filled 2022-06-08: qty 90, 90d supply, fill #0
  Filled 2022-09-30: qty 90, 90d supply, fill #1
  Filled 2022-12-23: qty 90, 90d supply, fill #2
  Filled 2023-03-11: qty 90, 90d supply, fill #3

## 2022-06-11 ENCOUNTER — Other Ambulatory Visit: Payer: Self-pay | Admitting: Physician Assistant

## 2022-06-11 DIAGNOSIS — R072 Precordial pain: Secondary | ICD-10-CM

## 2022-06-13 ENCOUNTER — Encounter: Payer: Self-pay | Admitting: Physician Assistant

## 2022-06-13 NOTE — Progress Notes (Signed)
Received following message from patient to [facility] email with cc'd to users Rose Delgado and [name] [name] is one of our cardiac rehab nurses that works closely with our cardiac patients at [facility] and had a coronary CT scan ordered to evaluate for chest pain.  "Hello to All,  On December 7th, I had a coronary CT scan at [facility] at 1130.  There were a couple situations that were unnerving.  I was extremely nervous and had never taken nitroglycerin sl and taking two at one time caused me to have an excruciating migraine.  Which in turn caused me to have palpitations including frequent PVCs.    First, two of the CT Techs and the Nurse were absolutely amazing.   One CT tech came in, who I knew from [facility], her name escapes me, came into the room and reassured me that she was here with me and would not let anything happen to me.  And a second CT tech volunteered to stay with me in the room during the scan, holding my hand and softly speaking instructing me to take deep breathes.   However, the third CT tech came into the room verbalizing, in front of me, saying " she is going to have to reschedule, because her heart rate has not been below 75."   This was before I received my front dose of Cardizem.  Needless to say, this upset me even more.  The Nurse gave me the medication IVP and they proceeded with the scan.  Following the scan, I noticed that my clothing was extremely wet with contrast and blood, where the lock on the IV was not tightened, leaving blood and contrast on my clothing.  This is what caused my results to be (limited) skewed, resulting in me having to repeat the scan.  I am sorry that I did not get names.  I think my nurse was [name].  [name] and 2 of the CT techs were "there with me."  I was ensuring that I will not be charged for this test, and I am not sure that I want to repeat this test at this time.    [name], BSN, RN, LPN  [phone]  (cell)  [phone] (work)  [name]"   I have replied, "Thank you for this feedback, Rose Delgado. I will forward this message to the nurse navigator [name] that helps manage our CT process as I am otherwise unfamiliar with how this gets billed out. Please keep [place] posted if you decide you want to repeat the test. [name]"  as well as a subsequent addendum that "I am so sorry you had a poor experience. This is not how the CT experience is intended to go and I hope we can find some resolution on this issue. Hope you're otherwise doing well. [name]"  Email routed to [name] to assist. Will cc to [name] so he is aware, since he saw patient subsequently in follow-up since original OV.

## 2022-06-14 NOTE — Progress Notes (Addendum)
I spoke with [name] personally yesterday who indicated the CT team was looking into this matter. Jessee Avers has since replied to the email as well and is helping to make this right. I am very grateful for the assistance of our CT team as this is not at all the experience we wanted her to have. I reached out to [place] and also asked her to let me know if I can help in any other day. Judeen Hammans will be reaching out to her personally to discuss next steps.

## 2022-07-05 ENCOUNTER — Ambulatory Visit (HOSPITAL_COMMUNITY)
Admission: RE | Admit: 2022-07-05 | Discharge: 2022-07-05 | Disposition: A | Discharge status: Home / Self Care | Payer: Commercial Managed Care - PPO | Source: Ambulatory Visit | Attending: Obstetrics & Gynecology | Admitting: Obstetrics & Gynecology

## 2022-07-05 DIAGNOSIS — Z1231 Encounter for screening mammogram for malignant neoplasm of breast: Secondary | ICD-10-CM | POA: Insufficient documentation

## 2022-07-06 ENCOUNTER — Ambulatory Visit (INDEPENDENT_AMBULATORY_CARE_PROVIDER_SITE_OTHER): Payer: Self-pay | Admitting: Surgical

## 2022-07-06 ENCOUNTER — Other Ambulatory Visit: Payer: Self-pay

## 2022-07-06 DIAGNOSIS — R202 Paresthesia of skin: Secondary | ICD-10-CM

## 2022-07-06 MED ORDER — CELECOXIB 100 MG PO CAPS
100.0000 mg | ORAL_CAPSULE | Freq: Every day | ORAL | 0 refills | Status: DC | PRN
  Filled 2022-07-06: qty 30, 30d supply, fill #0

## 2022-07-08 ENCOUNTER — Ambulatory Visit
Admission: EM | Admit: 2022-07-08 | Discharge: 2022-07-08 | Disposition: A | Discharge status: Home / Self Care | Payer: Commercial Managed Care - PPO | Attending: Nurse Practitioner | Admitting: Nurse Practitioner

## 2022-07-08 ENCOUNTER — Telehealth: Payer: Self-pay

## 2022-07-08 ENCOUNTER — Encounter: Payer: Self-pay | Admitting: Surgical

## 2022-07-08 DIAGNOSIS — M5441 Lumbago with sciatica, right side: Secondary | ICD-10-CM

## 2022-07-08 MED ORDER — METHYLPREDNISOLONE SODIUM SUCC 125 MG IJ SOLR
80.0000 mg | Freq: Once | INTRAMUSCULAR | Status: AC
  Administered 2022-07-08: 80 mg via INTRAMUSCULAR

## 2022-07-08 MED ORDER — PREDNISONE 20 MG PO TABS: 40.0000 mg | ORAL_TABLET | Freq: Every day | ORAL | 0 refills | Status: DC

## 2022-07-08 MED ORDER — PREDNISONE 20 MG PO TABS: 40.0000 mg | ORAL_TABLET | Freq: Every day | ORAL | 0 refills | Status: AC

## 2022-07-08 MED ORDER — KETOROLAC TROMETHAMINE 30 MG/ML IJ SOLN
30.0000 mg | Freq: Once | INTRAMUSCULAR | Status: AC
  Administered 2022-07-08: 30 mg via INTRAMUSCULAR

## 2022-07-08 NOTE — Progress Notes (Signed)
Follow-up Office Visit Note   Patient: Rose Delgado           Date of Birth: 1961/02/19           MRN: [id number] Visit Date: 07/06/2022 Requested by: Caryl Bis, MD [place],  [place] [address] PCP: Caryl Bis, MD  Subjective: Chief Complaint  Patient presents with   Left Knee - Follow-up    HPI: Rose Delgado is a 62 y.o. female who returns to the office for follow-up visit.    Plan at last visit was: Impression is saphenous neuritis left leg which may be related to the block or potentially could be related to the hamstring tendon harvest.  Plan at this time is observation with continued aerobic exercise.  Follow-up in 2-1/2 months for final clinical check.  No color changes or temperature differences left leg versus right leg.  Since then, patient notes continued left leg discomfort that extends from the medial aspect of the left knee down to the medial aspect of the ankle.  She states that this is fairly consistent pain that does not really relate to weightbearing.  Bothers her a lot with light touch such as when the blankets are over top of her leg when she tries to sleep.  She has paresthesias in this region with tingling sensation that travels down from the medial knee to the medial ankle.  This has been fairly consistent over the last year or so with little improvement.  She is having some low back pain but no significant worsening of her low back pain aside from the last week or 2.  She is having some radicular pain in the right leg.  Neurontin did not help and just caused sedation effects.              ROS: All systems reviewed are negative as they relate to the chief complaint within the history of present illness.  Patient denies fevers or chills.  Assessment & Plan: Visit Diagnoses:  1. Paresthesia of skin     Plan: Rose Delgado is a 62 y.o. female who returns to the office for follow-up visit for left leg pain.  Plan from last visit was noted above  in HPI.  They now return with continued discomfort with no significant improvement since seeing Dr. Marlou Sa on 04/06/2022.  She has been trying physical therapy and keeping active.  She is also been trying therapy exercises for her skin and decreased sensation in the distribution of her pain, trying different textures in this distribution.  This is provided a little bit of decrease sensitivity but not to any significant extent.  She does have nerve conduction study showing mild saphenous neuritis which is consistent with a lot of her complaints given the reproduction of her discomfort with Tinel sign over the hamstring harvest site as well as over the adductor canal where she had nerve block prior to surgery..  Another thought would be radicular pain from lumbar spine as she is having some back pain and right-sided radicular symptoms.  She had prior MRI of the lumbar spine in 2018 showing no significant compressive pathology.  Plan for further evaluation by Dr. Rolena Infante with ultrasound to see if any modalities at his disposal can help patient with her discomfort such as diagnostic adductor canal block, hydrodissection, evaluation for neuroma.  If she has worsening back pain that continues to bother her, could consider new MRI of the lumbar spine to see  if there is any left-sided pathology that could explain her symptoms as well.  Follow-Up Instructions: No follow-ups on file.   Orders:  No orders of the defined types were placed in this encounter.  Meds ordered this encounter  Medications   celecoxib (CELEBREX) 100 MG capsule    Sig: Take 1 capsule (100 mg total) by mouth daily as needed.    Dispense:  30 capsule    Refill:  0      Procedures: No procedures performed   Clinical Data: No additional findings.  Objective: Vital Signs: LMP 07/29/2017   Physical Exam:  Constitutional: Patient appears well-developed HEENT:  Head: Normocephalic Eyes:EOM are normal Neck: Normal range of  motion Cardiovascular: Normal rate Pulmonary/chest: Effort normal Neurologic: Patient is alert Skin: Skin is warm Psychiatric: Patient has normal mood and affect  Ortho Exam: Ortho exam demonstrates left knee with no effusion.  Excellent stability to anterior drawer and Lachman exam.  No calf tenderness.  Negative Homans' sign.  She does have positive Tinel sign over the medial proximal tibia incision from hamstring graft harvest.  Tapping this reproduces discomfort sensation that travels into the medial ankle.  Negative straight leg raise of the left leg.  She has excellent quadricep, hamstring, dorsiflexion, plantarflexion strength.  No significant tenderness over the medial or lateral joint lines.  She does have some discomfort with tapping in the adductor canal region as well with reproduction of medial knee and calf pain.  Specialty Comments:  No specialty comments available.  Imaging: No results found.   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   Nausea with vomiting 07/14/2014   Dysgeusia 03/11/2014   GERD (gastroesophageal reflux disease) 03/11/2014   Hot flashes 03/09/2014   Perimenopause 03/09/2014   Fibroids 03/09/2014   Past Medical History:  Diagnosis Date   Allergic rhinitis    Angio-edema    Fibroids    uterine   GERD (gastroesophageal reflux disease)     Hypertension    Irregular heart beat    Migraines    Perimenopause 03/09/2014   Postmenopausal 2016   Urticaria     Family History  Problem Relation Age of Onset   Other Father        aneursym   Other Mother        glaucoma; pacemaker   Hypertension Mother    Cancer Brother        lung   Hypertension Brother    Sleep apnea Brother    Hypertension Brother    Hypertension Brother    Cancer Sister        biliary, deceased age 57   Hypertension Sister    Hypertension Sister    Hypertension Sister    Hypertension Sister    Colon cancer Neg Hx    Allergic rhinitis Neg Hx    Asthma Neg Hx     Past Surgical History:  Procedure Laterality Date  ANTERIOR CRUCIATE LIGAMENT REPAIR Left 07/18/2021   Procedure: LEFT KNEE ANTERIOR CRUCIATE LIGAMENT RECONSTRUCTION, HAMSTRING AUTOGRAFT, MENISCAL DEBRIDEMENT;  Surgeon: Meredith Pel, MD;  Location: [place];  Service: Orthopedics;  Laterality: Left;   BREAST BIOPSY Left 2008   benign   COLONOSCOPY  06/2011   Dr. Britta Mccreedy: per patient it was normal, follow up in 10 years.    ESOPHAGOGASTRODUODENOSCOPY     Dr. Berneta Sages: late 1990s/early 2000   ESOPHAGOGASTRODUODENOSCOPY N/A 05/19/2014   SLF:NO OBVIOUS SOURCE FOR DYSGEUSIA IDENTIFED/MILD Non-erosive gastritis   INCISION AND DRAINAGE Right 07/31/2017   Procedure: INCISION AND DRAINAGE RIGHT THUMB NAIL BED;  Surgeon: Charlotte Crumb, MD;  Location: [place];  Service: Orthopedics;  Laterality: Right;   INGUINAL HERNIA REPAIR     KNEE SURGERY Right    NAILBED REPAIR Right 07/31/2017   Procedure: NAILBED BIOPSY;  Surgeon: Charlotte Crumb, MD;  Location: [place];  Service: Orthopedics;  Laterality: Right;   TONSILLECTOMY AND ADENOIDECTOMY     Social History   Occupational History   Occupation: Therapist, sports at [employer]: [employer]  Tobacco Use   Smoking status: Never   Smokeless tobacco: Never  Vaping Use   Vaping Use: Never used  Substance  and Sexual Activity   Alcohol use: No   Drug use: No   Sexual activity: Yes    Birth control/protection: Post-menopausal, None

## 2022-07-08 NOTE — ED Triage Notes (Signed)
Pt report right side low back pain radiates to right leg x 2 weeks. Tylenol arthritis gives no relief.

## 2022-07-08 NOTE — Discharge Instructions (Addendum)
Take medication as prescribed. Perform stretching exercises provided for you today. May apply ice or heat as needed. Ice for pain or swelling, heat for spasm or stiffness. Apply for 20 minutes, remove for 1 hour, then repeat as needed. Go to the emergency department immediately if you develop loss of bowel or bladder function, inability to ambulate, or worsening numbness or tingling that is different from baseline. Follow-up with your primary care physician if symptoms do not improve.

## 2022-07-08 NOTE — ED Provider Notes (Signed)
[facility]    CSN: [id number] Arrival date & time: 07/08/22  1041      History   Chief Complaint Chief Complaint  Patient presents with   Leg Pain    HPI Rose Delgado is a 62 y.o. female.   The history is provided by the patient.   Patient presents for today for current chronic right-sided sciatica.  Patient states symptoms started approximately 2 weeks ago.  States pain begins in the right lower back and radiates into the buttock and down the right thigh.  Today patient complains of numbness, denies tingling, lower extremity weakness, recent injury, trauma or fall, paresthesia, saddle anesthesia, loss of bowel or bladder function, nausea, vomiting, or urinary symptoms.  Patient reports this is a chronic condition for her.  She also reports she has been taking Tylenol for her symptoms.   Past Medical History:  Diagnosis Date   Allergic rhinitis    Angio-edema    Fibroids    uterine   GERD (gastroesophageal reflux disease)    Hypertension    Irregular heart beat    Migraines    Perimenopause 03/09/2014   Postmenopausal 2016   Urticaria     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 Britta Mccreedy: per patient it was normal, follow up in 10 years.    ESOPHAGOGASTRODUODENOSCOPY     Dr. Berneta Sages: late 1990s/early 2000   ESOPHAGOGASTRODUODENOSCOPY N/A 05/19/2014   SLF:NO OBVIOUS SOURCE FOR DYSGEUSIA IDENTIFED/MILD Non-erosive gastritis   INCISION AND DRAINAGE Right 07/31/2017   Procedure: INCISION AND DRAINAGE RIGHT THUMB NAIL BED;  Surgeon: Charlotte Crumb, MD;  Location: [place];  Service: Orthopedics;  Laterality: Right;   INGUINAL HERNIA REPAIR     KNEE SURGERY Right    NAILBED REPAIR Right 07/31/2017   Procedure: NAILBED BIOPSY;  Surgeon: Charlotte Crumb, MD;  Location: [place];  Service: Orthopedics;  Laterality: Right;   TONSILLECTOMY AND ADENOIDECTOMY      OB History     Gravida  0   Para      Term      Preterm      AB      Living         SAB      IAB      Ectopic      Multiple      Live Births  Home Medications    Prior to Admission medications   Medication Sig Start Date End Date Taking? Authorizing Provider  predniSONE (DELTASONE) 20 MG tablet Take 2 tablets (40 mg total) by mouth daily with breakfast for 5 days. 07/08/22 07/13/22 Yes Ayren Zumbro-Warren, Alda Lea, NP  Azelastine HCl 137 MCG/SPRAY SOLN Place 2 sprays into the nose 2 (two) times daily as needed. 10/30/21 10/30/22  Dara Hoyer, FNP  Biotin 5 MG CAPS Take 5 mg by mouth daily.    [provider]  celecoxib (CELEBREX) 100 MG capsule Take 1 capsule (100 mg total) by mouth daily as needed. 07/06/22   Magnant, Charles L, PA-C  cetirizine (ZYRTEC) 10 MG tablet  Take 1 tablet by mouth every day 10/04/21     cyclobenzaprine (FLEXERIL) 10 MG tablet Take 1 tablet by mouth every 8 (eight) hours as needed. 12/25/16   [provider]  diltiazem (CARDIZEM CD) 240 MG 24 hr capsule Take 1 capsule by mouth daily. 11/14/21   Arnoldo Lenis, MD  diltiazem (CARDIZEM) 30 MG tablet Take 1 tablet by mouth 2 times daily as needed (for breakthrough palpitations). 02/16/22   Arnoldo Lenis, MD  DULoxetine (CYMBALTA) 20 MG capsule Take 1 Capsule by mouth every day for nerve pain 02/05/22     EPINEPHrine 0.3 mg/0.3 mL IJ SOAJ injection Use as directed for severe allergic reaction. 10/30/21   Dara Hoyer, FNP  ergocalciferol (VITAMIN D2) 1.25 MG (50000 UT) capsule Take 2 capsules by mouth once a week 04/08/20     famotidine (PEPCID) 20 MG tablet Take 1 tablet (20 mg total) by mouth 2 (two) times daily. 01/30/22   Valentina Shaggy, MD  fluticasone [facility]) 50 MCG/ACT nasal spray USE 2 SPRAY INTO EACH NOSTRIL EVERY DAY 10/04/21     hydrALAZINE (APRESOLINE) 25 MG tablet Take 1 tablet by mouth two times a day 10/04/21     Magnesium 250 MG TABS Take 250 mg by mouth daily.    [provider]  methocarbamol (ROBAXIN) 500 MG tablet Take 500 mg by mouth every 8 (eight) hours as needed for muscle spasms.    [provider]  montelukast (SINGULAIR) 10 MG tablet Take 1 tablet by mouth every day 10/04/21     Multiple Vitamins-Minerals (SM COMPLETE 50+) TABS Take 1 tablet by mouth daily. 06/30/18   [provider]  ondansetron (ZOFRAN) 4 MG tablet Take 1 tablet (4 mg total) by mouth every 4 (four) hours as needed for nausea or vomiting. 07/14/14   Fields, Marga Melnick, MD  pantoprazole (PROTONIX) 40 MG tablet TAKE 1 TABLET BY MOUTH DAILY FOR ACID REFLUX 06/08/22     Potassium Chloride ER 20 MEQ TBCR Take 1 tablet (20 mEq) by mouth daily. 06/08/22   Arnoldo Lenis, MD  Pyridoxine HCl (VITAMIN B6 PO) Take 1 tablet by mouth daily.    [provider]   rizatriptan (MAXALT-MLT) 10 MG disintegrating tablet Take 10 mg by mouth as needed for migraine. May repeat in 2 hours if needed    [provider]    Family History Family History  Problem Relation Age of Onset   Other Father        aneursym   Other Mother        glaucoma; pacemaker   Hypertension Mother    Cancer Brother        lung   Hypertension Brother    Sleep apnea Brother    Hypertension Brother  Hypertension Brother    Cancer Sister        biliary, deceased age 3   Hypertension Sister    Hypertension Sister    Hypertension Sister    Hypertension Sister    Colon cancer Neg Hx    Allergic rhinitis Neg Hx    Asthma Neg Hx     Social History Social History   Tobacco Use   Smoking status: Never   Smokeless tobacco: Never  Vaping Use   Vaping Use: Never used  Substance Use Topics   Alcohol use: No   Drug use: No     Allergies   Metoprolol, Shellfish allergy, Flagyl [metronidazole], Gabapentin (once-daily), Lactose intolerance (gi), Losartan, Topamax [topiramate], Ciprofloxacin hcl, Diflucan [fluconazole], and Latex   Review of Systems Review of Systems Per HPI  Physical Exam Triage Vital Signs ED Triage Vitals  Enc Vitals Group     BP 07/08/22 1135 (!) 142/78     Pulse Rate 07/08/22 1135 (!) 54     Resp 07/08/22 1135 15     Temp 07/08/22 1135 98.3 F (36.8 C)     Temp Source 07/08/22 1135 Oral     SpO2 07/08/22 1135 96 %     Weight --      Height --      Head Circumference --      Peak Flow --      Pain Score 07/08/22 1134 8     Pain Loc --      Pain Edu? --      Excl. in [place]? --    No data found.  Updated Vital Signs BP (!) 142/78 (BP Location: Right Arm)   Pulse (!) 54   Temp 98.3 F (36.8 C) (Oral)   Resp 15   LMP 07/29/2017   SpO2 96%   Visual Acuity Right Eye Distance:   Left Eye Distance:   Bilateral Distance:    Right Eye Near:   Left Eye Near:    Bilateral Near:     Physical Exam Vitals and nursing note  reviewed.  Constitutional:      General: She is not in acute distress.    Appearance: She is well-developed.  HENT:     Head: Normocephalic.  Eyes:     Extraocular Movements: Extraocular movements intact.     Pupils: Pupils are equal, round, and reactive to light.  Cardiovascular:     Rate and Rhythm: Normal rate and regular rhythm.     Heart sounds: Normal heart sounds.  Pulmonary:     Effort: Pulmonary effort is normal.     Breath sounds: Normal breath sounds.  Abdominal:     General: Bowel sounds are normal. There is no distension.     Palpations: Abdomen is soft.     Tenderness: There is no abdominal tenderness. There is no guarding or rebound.  Genitourinary:    Vagina: Normal. No vaginal discharge.  Musculoskeletal:     Lumbar back: Tenderness present. No swelling, edema, deformity, signs of trauma or spasms. Decreased range of motion. Positive right straight leg raise test. Negative left straight leg raise test.     Comments: Tenderness noted to the right buttock along the sciatic nerve.  Skin:    General: Skin is warm and dry.     Findings: No erythema or rash.  Neurological:     Mental Status: She is alert and oriented to person, place, and time.     Cranial Nerves: No cranial nerve deficit.  Psychiatric:        Mood and Affect: Mood normal.        Behavior: Behavior normal.      UC Treatments / Results  Labs (all labs ordered are listed, but only abnormal results are displayed) Labs Reviewed - No data to display  EKG   Radiology No results found.  Procedures Procedures (including critical care time)  Medications Ordered in UC Medications  methylPREDNISolone sodium succinate (SOLU-MEDROL) 125 mg/2 mL injection 80 mg (has no administration in time range)  ketorolac (TORADOL) 30 MG/ML injection 30 mg (has no administration in time range)    Initial Impression / Assessment and Plan / UC Course  I have reviewed the triage vital signs and the nursing  notes.  Pertinent labs & imaging results that were available during my care of the patient were reviewed by me and considered in my medical decision making (see chart for details).  The patient is well-appearing, she is in no acute distress, vital signs are stable at this time.  Symptoms consistent with right sided low back pain with right-sided sciatica. Solu-Medrol 80 mg IM injection was given along with Toradol 30 mg IM.  Most recent creatinine dated 01/26/2022 of 0.84.  Will start patient on prednisone 40 mg for 5 days. Patient advised to continue over-the-counter Tylenol as needed for pain or discomfort. Supportive care recommendations were provided to the patient to include use of ice or heat, and stretching.  Sciatica exercises were provided.  Patient was given strict indications for ER follow-up. Patient verbalizes understanding. All questions were answered. Patient stable for discharge.  Final Clinical Impressions(s) / UC Diagnoses   Final diagnoses:  Acute right-sided low back pain with right-sided sciatica     Discharge Instructions      Take medication as prescribed. Perform stretching exercises provided for you today. May apply ice or heat as needed. Ice for pain or swelling, heat for spasm or stiffness. Apply for 20 minutes, remove for 1 hour, then repeat as needed. Go to the emergency department immediately if you develop loss of bowel or bladder function, inability to ambulate, or worsening numbness or tingling that is different from baseline. Follow-up with your primary care physician if symptoms do not improve.       ED Prescriptions     Medication Sig Dispense Auth. Provider   predniSONE (DELTASONE) 20 MG tablet Take 2 tablets (40 mg total) by mouth daily with breakfast for 5 days. 10 tablet Jarret Torre-Warren, Alda Lea, NP      PDMP not reviewed this encounter.   Tish Men, NP 07/08/22 1213

## 2022-07-09 ENCOUNTER — Other Ambulatory Visit (HOSPITAL_COMMUNITY): Payer: Self-pay

## 2022-07-09 ENCOUNTER — Other Ambulatory Visit: Payer: Self-pay

## 2022-07-09 NOTE — Addendum Note (Signed)
Addended by: Laurann Montana on: 07/09/2022 11:12 AM   Modules accepted: Orders

## 2022-07-12 ENCOUNTER — Ambulatory Visit (INDEPENDENT_AMBULATORY_CARE_PROVIDER_SITE_OTHER): Payer: Commercial Managed Care - PPO | Admitting: Sports Medicine

## 2022-07-12 ENCOUNTER — Ambulatory Visit: Payer: Self-pay

## 2022-07-12 DIAGNOSIS — R202 Paresthesia of skin: Secondary | ICD-10-CM

## 2022-07-12 DIAGNOSIS — H40023 Open angle with borderline findings, high risk, bilateral: Secondary | ICD-10-CM | POA: Diagnosis not present

## 2022-07-12 DIAGNOSIS — S83512S Sprain of anterior cruciate ligament of left knee, sequela: Secondary | ICD-10-CM

## 2022-07-12 DIAGNOSIS — G5782 Other specified mononeuropathies of left lower limb: Secondary | ICD-10-CM

## 2022-07-12 NOTE — Patient Instructions (Addendum)
Roneshia:  - we did nerve hydrodissection procedure today for the saphenous nerve.  I would like to rest for the next 48 hours.  Starting on Saturday and Sunday you may get back into leisurely walking only but no other exercise or lifting.  Starting on Monday May get back into regular activity and exercise as you desire.  - We will see you back in 2-3 weeks. Another option we can consider is extracorporeal shockwave treatment. We have this at the office.

## 2022-07-12 NOTE — Progress Notes (Signed)
Rose Delgado - 62 y.o. female MRN [id number]  Date of birth: Oct 09, 1960  Office Visit Note: Visit Date: 07/12/2022 PCP: Caryl Bis, MD Referred by: Rosalin Hawking  Subjective: Chief Complaint  Patient presents with   Left Leg - Pain   HPI: Rose Delgado is a pleasant 62 y.o. female who presents today for chronic left leg pain and paresthesias.  She has seen Dr. Marlou Sa and Lurena Joiner a few times for this leg pain and paresthesia.  She has been diagnosed with saphenous neuritis of the left leg.  This all started after she had ACL reconstruction with a hamstring tendon harvest, this was performed in January 2023. She had no pain prior to this.  She has rather bothersome numbness and tingling that starts over the medial aspect of the knee and extends down the medial calf to about the ankle/heel. She has no numbness and tingling into the toes.  Her pain is still present, but these are slightly less bothersome than 1 year ago.  She has taken NSAIDs as well as gabapentin without relief.  Gabapentin left her feeling very groggy.  Pertinent ROS were reviewed with the patient and found to be negative unless otherwise specified above in HPI.   Assessment & Plan: Visit Diagnoses:  1. Neuritis of left saphenous nerve   2. Paresthesia of skin   3. Rupture of anterior cruciate ligament of left knee, sequela    Plan: Had a good discussion with Rose Delgado on the treatment of her likely saphenous neuritis.  She has failed conservative management and did not tolerate gabapentin, no relief with NSAIDs.  This all came about after her ACL reconstruction with hamstring tendon harvest.  She has an associated Tinel's right near the hamstring tendon harvest and the ultrasound does show some swelling within the nerve at this region.  Through shared decision-making, we elected to proceed with ultrasound-guided hydrodissection of the nerve.  She had good analgesic control after this obviously with only the  anesthetic block.  We will see how this does for her in the coming weeks.  I would like her to follow-up in about 2-3 weeks to see what sort of progress we are making.  Other consideration would be extracorporeal shockwave treatment for this area to break up any adhesions or surgical scar from the tendon harvest.  We did discuss the role of possible adductor canal block of the saphenous nerve more proximally, although I have very low suspicion that this would be beneficial.  Will follow-up in 2 weeks.  Follow-up: Return in about 2 weeks (around 07/26/2022) for f/u in 2-3 weeks with Dr. Rolena Infante for leg n/t.   Meds & Orders: No orders of the defined types were placed in this encounter.   Orders Placed This Encounter  Procedures   [place] Extrem [place]     Procedures: US-guided Saphenous nerve hydrodissection (injection), left knee  After discussion on risks/benefits/indications, informed verbal consent was obtained. A timeout was then performed. The patient was positioned in the supine position with the knee flexed to about 30 degrees. The ultrasound was placed in the transverse plane to identify the saphenous nerve as well as the infrapatellar branch of the saphenous nerve.  The overlying soft tissue was anesthetized with 3 cc of lidocaine 1%.  Using ultrasound guidance via an in-plane approach, a 25-gauge, 1.5" needle with a mixture of 2:2:5:1 mL's of lidocaine:bupivicaine:dextrose 5%:betamethasone was directed from an inferior to proximal approach to surrounding saphenous nerve and  branch.  Using ultrasound guidance, the nerve was hydrodissected with the above mixture injectate with a 360 spread around the nerve and lysis of surround fascial planes.  Patient tolerated procedure well without immediate complication.  Band-Aid and compression wrap was applied.        Clinical History: No specialty comments available.  She reports that she has never smoked. She has never used smokeless tobacco. No  results for input(s): "HGBA1C", "LABURIC" in the last 8760 hours.  Objective:   Vital Signs: LMP 07/29/2017   Physical Exam  Gen: Well-appearing, in no acute distress; non-toxic CV: Regular Rate. Well-perfused. Warm.  Resp: Breathing unlabored on room air; no wheezing. Psych: Fluid speech in conversation; appropriate affect; normal thought process Neuro: Sensation intact throughout. No gross coordination deficits.   Ortho Exam - Left leg/knee: Left knee without effusion or swelling.  There is a well-healed surgical incision over the medial proximal tibia from prior hamstring graft harvest.  There is positive Tinel's that is significant over this region and slightly extending down the medial border of the calf and the saphenous nerve distribution.  I cannot appreciate much of a Tinel's sign proximally near the adductor canal. + Notable hyperesthesia with soft alpation.  Imaging:  Limited musculoskeletal ultrasound of the left lower extremity was  performed.  Evaluation of the left medial knee with the saphenous and  infrapatellar branch of the saphenous nerve was identified.  Saphenous  vein and other neurovasculature also identified effusion.  There is some  hypoechoic swelling surrounding the infrapatellar branch of the saphenous  nerve on the medial side of the tibial plateau.  Overlying SGS muscle  tendons were noted here.  The nerve is identified laterally and  superficially to the Pes anserine bursa.  No cortical irregularity noted  of the proximal tibial on limited US.   *Technically successful saphenous nerve hydrodissection.      EMG & NCV Findings (01/19/22): Evaluation of the left saphenous sensory nerve showed prolonged distal peak latency (5.3 ms).  All remaining nerves (as indicated in the following tables) were within normal limits.     All examined muscles (as indicated in the following table) showed no evidence of electrical instability.     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  However, this likely does not explain the totality of her symptoms.  Careful clinical correlation is paramount.     There is no significant electrodiagnostic evidence of any other focal nerve entrapment, lumbosacral plexopathy or lumbar radiculopathy.    Recommendations: 1.  Follow-up with referring physician. 2.  Continue current management of symptoms.   ___________________________ Laurence Spates FAAPMR Board Certified, American Board of Physical Medicine and Rehabilitation    Nerve Conduction Studies Anti Sensory Summary Table    Stim Site NR Peak (ms) Norm Peak (ms) P-T Amp (V) Norm P-T Amp Site1 Site2 Delta-P (ms) Dist (cm) Vel (m/s) Norm Vel (m/s)  Left Saphenous Anti Sensory (Ant Med Mall)  27.5C  14cm    *5.3 <4.4 6.2 >2 14cm Ant Med Mall 5.3 0.0   >32  Site 2    5.6   16.8                Left Sup Fibular Anti Sensory (Ant Lat Mall)  27.5C  14 cm    3.7 <4.4 25.6 >5.0 14 cm Ant Lat Mall 3.7 14.0 38 >32  Left Sural Anti Sensory (Lat  Mall)  27.5C  Calf    4.0 <4.0 22.4 >5.0 Calf Lat Mall 4.0 14.0 35 >35    Motor Summary Table    Stim Site NR Onset (ms) Norm Onset (ms) O-P Amp (mV) Norm O-P Amp Site1 Site2 Delta-0 (ms) Dist (cm) Vel (m/s) Norm Vel (m/s)  Left Fibular Motor (Ext Dig Brev)  27.3C  Ankle    3.4 <6.1 7.3 >2.5 B Fib Ankle 6.2 29.0 47 >38  B Fib    9.6   3.2   Poplt B Fib 1.4 9.0 64 >40  Poplt    11.0   6.6                Left Tibial Motor (Abd Hall Brev)  27.4C  Ankle    5.9 <6.1 3.6 >3.0 Knee Ankle 5.4 35.0 65 >35  Knee    11.3   9.4                  EMG    Side Muscle Nerve Root Ins Act Fibs Psw Amp Dur Poly Recrt Int Fraser Din Comment  Left AntTibialis Dp Br Peron L4-5 Nml Nml Nml Nml Nml 0 Nml Nml    Left Fibularis Longus  Sup Br Peron L5-S1 Nml Nml Nml Nml Nml 0 Nml Nml    Left MedGastroc Tibial S1-2 Nml Nml Nml Nml Nml 0 Nml Nml     Left VastusMed Femoral L2-4 Nml Nml Nml Nml Nml 0 Nml Nml    Left BicepsFemS Sciatic L5-S1 Nml Nml Nml Nml Nml 0 Nml Nml        Nerve Conduction Studies Anti Sensory Left/Right Comparison    Stim Site L Lat (ms) R Lat (ms) L-R Lat (ms) L Amp (V) R Amp (V) L-R Amp (%) Site1 Site2 L Vel (m/s) R Vel (m/s) L-R Vel (m/s)  Saphenous Anti Sensory (Ant Med Mall)  27.5C  14cm *5.3     6.2     14cm Ant Med Mall        Site 2 5.6     16.8                Sup Fibular Anti Sensory (Ant Lat Mall)  27.5C  14 cm 3.7     25.6     14 cm Ant Lat Mall 38      Sural Anti Sensory (Lat Mall)  27.5C  Calf 4.0     22.4     Calf Lat Mall 35        Motor Left/Right Comparison    Stim Site L Lat (ms) R Lat (ms) L-R Lat (ms) L Amp (mV) R Amp (mV) L-R Amp (%) Site1 Site2 L Vel (m/s) R Vel (m/s) L-R Vel (m/s)  Fibular Motor (Ext Dig Brev)  27.3C  Ankle 3.4     7.3     B Fib Ankle 47      B Fib 9.6     3.2     Poplt B Fib 64      Poplt 11.0     6.6                Tibial Motor (Abd Hall Brev)  27.4C  Ankle 5.9     3.6     Knee Ankle 65      Knee 11.3     9.4                      Waveforms:  Past Medical/Family/Surgical/Social History: Medications & Allergies reviewed per EMR, new medications updated. 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 02/16/2016   Nausea with vomiting 07/14/2014   Dysgeusia 03/11/2014    GERD (gastroesophageal reflux disease) 03/11/2014   Hot flashes 03/09/2014   Perimenopause 03/09/2014   Fibroids 03/09/2014   Past Medical History:  Diagnosis Date   Allergic rhinitis    Angio-edema    Fibroids    uterine   GERD (gastroesophageal reflux disease)    Hypertension    Irregular heart beat    Migraines    Perimenopause 03/09/2014   Postmenopausal 2016   Urticaria    Family History  Problem Relation Age of Onset   Other Father        aneursym   Other Mother        glaucoma; pacemaker   Hypertension Mother    Cancer Brother        lung   Hypertension Brother    Sleep apnea Brother    Hypertension Brother    Hypertension Brother    Cancer Sister        biliary, deceased age 2   Hypertension Sister    Hypertension Sister    Hypertension Sister    Hypertension Sister    Colon cancer Neg Hx    Allergic rhinitis Neg Hx    Asthma Neg Hx    Past Surgical History:  Procedure Laterality Date   ANTERIOR CRUCIATE LIGAMENT REPAIR Left 07/18/2021   Procedure: LEFT KNEE ANTERIOR CRUCIATE LIGAMENT RECONSTRUCTION, HAMSTRING AUTOGRAFT, MENISCAL DEBRIDEMENT;  Surgeon: Meredith Pel, MD;  Location: [place];  Service: Orthopedics;  Laterality: Left;   BREAST BIOPSY Left 2008   benign   COLONOSCOPY  06/2011   Dr. Britta Mccreedy: per patient it was normal, follow up in 10 years.    ESOPHAGOGASTRODUODENOSCOPY     Dr. Berneta Sages: late 1990s/early 2000   ESOPHAGOGASTRODUODENOSCOPY N/A 05/19/2014   SLF:NO OBVIOUS SOURCE FOR DYSGEUSIA IDENTIFED/MILD Non-erosive gastritis   INCISION AND DRAINAGE Right 07/31/2017   Procedure: INCISION AND DRAINAGE RIGHT THUMB NAIL BED;  Surgeon: Charlotte Crumb, MD;  Location: [place];  Service: Orthopedics;  Laterality: Right;   INGUINAL HERNIA REPAIR     KNEE SURGERY Right    NAILBED REPAIR Right 07/31/2017   Procedure: NAILBED BIOPSY;  Surgeon: Charlotte Crumb, MD;  Location: [place];  Service: Orthopedics;   Laterality: Right;   TONSILLECTOMY AND ADENOIDECTOMY     Social History   Occupational History   Occupation: Therapist, 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

## 2022-07-12 NOTE — Progress Notes (Signed)
Hx of ACL in January of 2023  Pain ever since Does have numbness/tingling in that leg constantly  She has tried tylenol/motrin/celebrex all of which helps minimally

## 2022-07-13 ENCOUNTER — Encounter: Payer: Self-pay | Admitting: Sports Medicine

## 2022-07-25 ENCOUNTER — Other Ambulatory Visit: Payer: Self-pay

## 2022-07-26 ENCOUNTER — Encounter: Payer: Commercial Managed Care - PPO | Attending: Family Medicine | Admitting: Nutrition

## 2022-07-26 VITALS — Ht 63.0 in | Wt 150.0 lb

## 2022-07-26 DIAGNOSIS — E782 Mixed hyperlipidemia: Secondary | ICD-10-CM | POA: Diagnosis not present

## 2022-07-26 DIAGNOSIS — K760 Fatty (change of) liver, not elsewhere classified: Secondary | ICD-10-CM | POA: Insufficient documentation

## 2022-07-26 NOTE — Progress Notes (Unsigned)
Medical Nutrition Therapy  Employe [phone] First visit. Appointment Start time:  53  Appointment End time:  59  Primary concerns today: Fatty liver and Hyerlipidemia Referral diagnosis:  K76.0, K78.0 Preferred learning style: No preference  Learning readiness: Ready   NUTRITION ASSESSMENT  62 yr old bfemale here for a fatty liver. She notes she has gained some weight since COVID. UBW was in the 130's.  She notes she doesn't eat a lot at one time. Tends to graze. 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 Drinks juice or tea often. Not working out right now. Wants to get back to feeling better and reverse her fatty liver and high cholesterol levels.  She is willing to work with Lifestyle Medicine to improve her health and reduce her health issues.  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    Anthropometrics  Wt Readings from Last 3 Encounters:  05/23/22 154 lb (69.9 kg)  01/29/22 151 lb 6.4 oz (68.7 kg)  01/27/22 150 lb (68 kg)   Ht Readings from Last 3 Encounters:  05/23/22 '5\' 3"'$  (1.6 m)  01/29/22 '5\' 3"'$  (1.6 m)  01/27/22 '5\' 3"'$  (1.6 m)   There is no height or weight on file to calculate BMI. '@BMIFA'$ @ Facility age limit for growth %iles is 20 years. Facility age limit for growth %iles is 20 years.    Clinical Medical Hx: Fatty liver, allergies, heart issues, depression, VIt D deficiency, GERD,  Medications: see chart Labs: No results found for: "HGBA1C"    Latest Ref Rng & Units 05/29/2022   10:20 AM 01/29/2022    2:18 PM 01/27/2022    1:24 PM  CMP  Glucose 70 - 99 mg/dL 100  110  104   BUN 8 - 23 mg/dL '11  9  13   '$ Creatinine 0.44 - 1.00 mg/dL 0.84  0.83  0.98   Sodium 135 - 145 mmol/L 138  139  137   Potassium 3.5 - 5.1 mmol/L 4.0  4.3  3.6   Chloride 98 - 111 mmol/L 107  107  107   CO2 22 - 32 mmol/L '24  26  22   '$ Calcium 8.9 - 10.3 mg/dL 9.9  10.2  9.9   Total Protein 6.5 -  8.1 g/dL  7.0    Total Bilirubin 0.3 - 1.2 mg/dL  0.3    Alkaline Phos 38 - 126 U/L  109    AST 15 - 41 U/L  46    ALT 0 - 44 U/L  69     Lipid Panel     Component Value Date/Time   CHOL 235 (H) 11/25/2020 0707   TRIG 196 (H) 11/25/2020 0707   HDL 50 11/25/2020 0707   CHOLHDL 4.7 11/25/2020 0707   VLDL 39 11/25/2020 0707   LDLCALC 146 (H) 11/25/2020 0707    Notable Signs/Symptoms: None  Lifestyle & Dietary Hx Works as an Therapist, sports at Special educational needs teacher for [employer] a lot of foods pick up or fast food. Cooks some at home  Estimated daily fluid intake: 16 oz Supplements: Vit D Sleep: varies Stress / self-care: her work Current average weekly physical activity: ADL. Is active on her job walking.  24-Hr Dietary Recall First Meal: Ham egg biscuits, hashbrowns, lemonade Snack:  Second Meal: 4 chicken wings, potato wedges, water, corn Snack:  Third Meal: Skipped:  Pasta salad; pickles and carrots Snack:  Beverages: water, lemonade, soda,   Estimated Energy Needs Calories: .1500 Carbohydrate: 170g Protein: 112g Fat: 42g   NUTRITION DIAGNOSIS  NB-1.1 Food and nutrition-related knowledge deficit As related to diet high in processed sugars , fats and fast foods.  As evidenced by Fatty liver and Hyperlipidemia and weight gain.Marland Kitchen   NUTRITION INTERVENTION  Nutrition education (E-1) on the following topics:  Lifestyle Medicine  - Whole Food, Plant Predominant Nutrition is highly recommended: Eat Plenty of vegetables, Mushrooms, fruits, Legumes, Whole Grains, Nuts, seeds in lieu of processed meats, processed snacks/pastries red meat, poultry, eggs.    -It is better to avoid simple carbohydrates including: Cakes, Sweet Desserts, Ice Cream, Soda (diet and regular), Sweet Tea, Candies, Chips, Cookies, Store Bought Juices, Alcohol in Excess of  1-2 drinks a day, Lemonade,  Artificial Sweeteners, Doughnuts, Coffee Creamers, "Sugar-free" Products, etc, etc.  This is not a complete  list.....  Exercise: If you are able: 30 -60 minutes a day ,4 days a week, or 150 minutes a week.  The longer the better.  Combine stretch, strength, and aerobic activities.  If you were told in the past that you have high risk for cardiovascular diseases, you may seek evaluation by your heart doctor prior to initiating moderate to intense exercise programs.   Handouts Provided Include  Lifestyle Medicine.  Learning Style & Readiness for Change Teaching method utilized: Visual & Auditory  Demonstrated degree of understanding via: Teach Back  Barriers to learning/adherence to lifestyle change: None  Goals Established by Pt Goals  Work on eating healthier breakfast of overnight oats Get rid of juices and only drink water. Focus on more whole plant based foods Choose Healthy Choice Power Bowl Meals.   MONITORING & EVALUATION Dietary intake, weekly physical activity, in 1 month.  Next Steps  Patient is to work on meal planning and food prepping.Marland Kitchen

## 2022-07-26 NOTE — Patient Instructions (Addendum)
Goals  Work on eating healthier breakfast of overnight oats Get rid of juices Focus on more whole plant based foods Choose Healthy Choice Power [employer]. Aim for 25 grams of fiber per day.

## 2022-07-27 ENCOUNTER — Encounter: Payer: Self-pay | Admitting: Sports Medicine

## 2022-07-27 ENCOUNTER — Ambulatory Visit (INDEPENDENT_AMBULATORY_CARE_PROVIDER_SITE_OTHER): Payer: Commercial Managed Care - PPO | Admitting: Sports Medicine

## 2022-07-27 DIAGNOSIS — S83512D Sprain of anterior cruciate ligament of left knee, subsequent encounter: Secondary | ICD-10-CM | POA: Diagnosis not present

## 2022-07-27 DIAGNOSIS — G5782 Other specified mononeuropathies of left lower limb: Secondary | ICD-10-CM

## 2022-07-27 DIAGNOSIS — M5431 Sciatica, right side: Secondary | ICD-10-CM | POA: Diagnosis not present

## 2022-07-27 MED ORDER — METHYLPREDNISOLONE 4 MG PO TBPK: ORAL_TABLET | ORAL | 0 refills | Status: DC

## 2022-07-27 NOTE — Progress Notes (Signed)
Rose Delgado - 62 y.o. female MRN [id number]  Date of birth: August 31, 1960  Office Visit Note: Visit Date: 07/27/2022 PCP: Caryl Bis, MD Referred by: Caryl Bis, MD  Subjective: Chief Complaint  Patient presents with   Left Leg - Pain, Follow-up   HPI: Rose Delgado is a pleasant 62 y.o. female who presents today for follow-up of saphenous neuritis of left leg. Also with right sided-sciatica like pain.  Had US-guided saphenous nerve hydrodissection 2 weeks ago, she reports at least 50% improvement, maybe more. Improvement in the burning pain, still has some nerve-related pain around her prior graft harvest, but less.  She was able to shave her legs without irritation or pain for the first time in a long time.  Also notes that the burning pain in her heel has almost completely resolved.    She has been having right-sided sciatica-like pain.  Feels a electric pain rating into the buttock and down the posterior thigh.  No specific back pain.  No weakness of the legs.  She was seen in the ED and did have an IM injection of Toradol and methylprednisolone that improved her pain rather significantly.  Have something else to give her the discomfort.  Feels like this started because walking differently of the left knee pain.  Pertinent ROS were reviewed with the patient and found to be negative unless otherwise specified above in HPI.   Assessment & Plan: Visit Diagnoses:  1. Neuritis of left saphenous nerve   2. Rupture of anterior cruciate ligament of left knee, subsequent encounter   3. Right sided sciatica    Plan: Discussed with Berklee that it is reassuring that she got at least 50% or more improvement after the saphenous nerve hydrodissection.  This does point to the fact that it is likely entrapment just at or proximal to her prior hamstring tendon harvest site.  I let her know I will send this message to Dr. Marlou Sa and Lurena Joiner and would like them to have a follow-up appointment  with her to discuss next steps.  We could consider an additional hydrodissection but would wait at least 6-8 weeks from the prior injection.  Given the fact that she had improvement, and may be worth talking about a surgical discussion to see if they can free up the saphenous nerve in this region with the residual scar tissue/restriction.  She will have this discussion with Dr. Marlou Sa in team.  We discussed we could always do a trial of extracorporeal shockwave therapy and attempt to break up scar tissue, however given her improvement I do not want to irritate the nerve with this today.    She is also having right-sided sciatica-like pain with no red flag symptoms or weakness.  We will start her on a 6-day Medrol Dosepak, IM injection of Toradol provided today.  I did print out and demonstrated home stretching exercises for the low back and piriformis.  She will follow-up as needed for this if not improved.  Follow-up: with Dr. Dean/Luke to discuss next steps for saphenous neuritis   Meds & Orders:  Meds ordered this encounter  Medications   methylPREDNISolone (MEDROL DOSEPAK) 4 MG TBPK tablet    Sig: Take per packet instructions.    Dispense:  1 each    Refill:  0   No orders of the defined types were placed in this encounter.    Procedures: No procedures performed      Clinical History: No specialty comments available.  She reports that she has never smoked. She has never used smokeless tobacco. No results for input(s): "HGBA1C", "LABURIC" in the last 8760 hours.  Objective:   Vital Signs: LMP 07/29/2017   Physical Exam  Gen: Well-appearing, in no acute distress; non-toxic CV: Regular Rate. Well-perfused. Warm.  Resp: Breathing unlabored on room air; no wheezing. Psych: Fluid speech in conversation; appropriate affect; normal thought process Neuro: Sensation intact throughout. No gross coordination deficits.   Ortho Exam - Left knee: Knee without effusion or swelling.  There is a  well-healed surgical incision over the medial proximal tibia from prior hamstring graft harvest.  Positive Tinel sign near this prior graft harvest site, although less bothersome than last visit.  Improved hyperesthesia.  Palpation down the medial calf.  - Low back/buttock: There is full range of motion of the lumbar spine with flexion and extension.  Negative straight leg raise.  5/5 strength of the bilateral lower extremities in the L4-S1 dermatome.  There is positive TTP palpating deep within the buttock and piriformis region.  Imaging: No results found.  Past Medical/Family/Surgical/Social History: Medications & Allergies reviewed per EMR, new medications updated. 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 02/16/2016   Nausea with vomiting 07/14/2014   Dysgeusia 03/11/2014   GERD (gastroesophageal reflux disease) 03/11/2014   Hot flashes 03/09/2014   Perimenopause 03/09/2014   Fibroids 03/09/2014   Past Medical History:  Diagnosis Date   Allergic rhinitis    Angio-edema    Fibroids    uterine   GERD (gastroesophageal reflux disease)    Hypertension    Irregular heart beat    Migraines    Nausea with vomiting 07/14/2014   Perimenopause 03/09/2014   Postmenopausal 2016   Urticaria    Family History  Problem Relation Age of Onset   Other Father        aneursym   Other Mother        glaucoma;  pacemaker   Hypertension Mother    Cancer Brother        lung   Hypertension Brother    Sleep apnea Brother    Hypertension Brother    Hypertension Brother    Cancer Sister        biliary, deceased age 38   Hypertension Sister    Hypertension Sister    Hypertension Sister    Hypertension Sister    Colon cancer Neg Hx    Allergic rhinitis Neg Hx    Asthma Neg Hx    Past Surgical History:  Procedure Laterality Date   ANTERIOR CRUCIATE LIGAMENT REPAIR Left 07/18/2021   Procedure: LEFT KNEE ANTERIOR CRUCIATE LIGAMENT RECONSTRUCTION, HAMSTRING AUTOGRAFT, MENISCAL DEBRIDEMENT;  Surgeon: Meredith Pel, MD;  Location: [facility];  Service: Orthopedics;  Laterality: Left;   BREAST BIOPSY Left 2008   benign   COLONOSCOPY  06/2011   Dr. Britta Mccreedy: per patient it was normal, follow up in 10 years.  ESOPHAGOGASTRODUODENOSCOPY     Dr. Berneta Sages: late 1990s/early 2000   ESOPHAGOGASTRODUODENOSCOPY N/A 05/19/2014   SLF:NO OBVIOUS SOURCE FOR DYSGEUSIA IDENTIFED/MILD Non-erosive gastritis   INCISION AND DRAINAGE Right 07/31/2017   Procedure: INCISION AND DRAINAGE RIGHT THUMB NAIL BED;  Surgeon: Charlotte Crumb, MD;  Location: [facility];  Service: Orthopedics;  Laterality: Right;   INGUINAL HERNIA REPAIR     KNEE SURGERY Right    NAILBED REPAIR Right 07/31/2017   Procedure: NAILBED BIOPSY;  Surgeon: Charlotte Crumb, MD;  Location: [facility];  Service: Orthopedics;  Laterality: Right;   TONSILLECTOMY AND ADENOIDECTOMY     Social History   Occupational History   Occupation: Therapist, sports at [employer]: [employer]  Tobacco Use   Smoking status: Never   Smokeless tobacco: Never  Vaping Use   Vaping Use: Never used  Substance and Sexual Activity   Alcohol use: No   Drug use: No   Sexual activity: Yes    Birth control/protection: Post-menopausal, None

## 2022-07-30 ENCOUNTER — Encounter: Payer: Self-pay | Admitting: Sports Medicine

## 2022-07-31 ENCOUNTER — Encounter: Payer: Self-pay | Admitting: Nutrition

## 2022-08-07 ENCOUNTER — Ambulatory Visit (HOSPITAL_COMMUNITY)
Admission: RE | Admit: 2022-08-07 | Discharge: 2022-08-07 | Disposition: A | Discharge status: Home / Self Care | Payer: Commercial Managed Care - PPO | Source: Ambulatory Visit | Attending: Family Medicine | Admitting: Family Medicine

## 2022-08-07 ENCOUNTER — Other Ambulatory Visit (HOSPITAL_COMMUNITY): Payer: Self-pay | Admitting: Family Medicine

## 2022-08-07 DIAGNOSIS — M5416 Radiculopathy, lumbar region: Secondary | ICD-10-CM

## 2022-08-07 DIAGNOSIS — E7849 Other hyperlipidemia: Secondary | ICD-10-CM | POA: Diagnosis not present

## 2022-08-07 DIAGNOSIS — J301 Allergic rhinitis due to pollen: Secondary | ICD-10-CM | POA: Diagnosis not present

## 2022-08-07 DIAGNOSIS — M5126 Other intervertebral disc displacement, lumbar region: Secondary | ICD-10-CM | POA: Diagnosis not present

## 2022-08-07 DIAGNOSIS — K760 Fatty (change of) liver, not elsewhere classified: Secondary | ICD-10-CM | POA: Diagnosis not present

## 2022-08-07 DIAGNOSIS — I10 Essential (primary) hypertension: Secondary | ICD-10-CM | POA: Diagnosis not present

## 2022-08-07 DIAGNOSIS — E559 Vitamin D deficiency, unspecified: Secondary | ICD-10-CM | POA: Diagnosis not present

## 2022-08-07 DIAGNOSIS — G43909 Migraine, unspecified, not intractable, without status migrainosus: Secondary | ICD-10-CM | POA: Diagnosis not present

## 2022-08-07 DIAGNOSIS — M545 Low back pain, unspecified: Secondary | ICD-10-CM | POA: Diagnosis not present

## 2022-08-07 DIAGNOSIS — Z6825 Body mass index (BMI) 25.0-25.9, adult: Secondary | ICD-10-CM | POA: Diagnosis not present

## 2022-08-10 ENCOUNTER — Ambulatory Visit: Payer: Commercial Managed Care - PPO | Admitting: Sports Medicine

## 2022-08-13 DIAGNOSIS — Z6825 Body mass index (BMI) 25.0-25.9, adult: Secondary | ICD-10-CM | POA: Diagnosis not present

## 2022-08-13 DIAGNOSIS — M5416 Radiculopathy, lumbar region: Secondary | ICD-10-CM | POA: Diagnosis not present

## 2022-08-28 ENCOUNTER — Ambulatory Visit (HOSPITAL_COMMUNITY): Payer: Commercial Managed Care - PPO

## 2022-08-28 ENCOUNTER — Encounter: Payer: Self-pay | Admitting: Nutrition

## 2022-08-28 ENCOUNTER — Encounter: Payer: Commercial Managed Care - PPO | Attending: Family Medicine | Admitting: Nutrition

## 2022-08-28 VITALS — Ht 63.0 in | Wt 147.0 lb

## 2022-08-28 DIAGNOSIS — K760 Fatty (change of) liver, not elsewhere classified: Secondary | ICD-10-CM | POA: Diagnosis not present

## 2022-08-28 DIAGNOSIS — E782 Mixed hyperlipidemia: Secondary | ICD-10-CM | POA: Insufficient documentation

## 2022-08-28 NOTE — Progress Notes (Signed)
Medical Nutrition Therapy  Employe [phone] Second visit. Appointment Start time:  1600  Appointment End time:  67  Primary concerns today: Fatty liver and Hyerlipidemia Referral diagnosis:  K76.0, K78.0 Preferred learning style: No preference  Learning readiness: Ready   NUTRITION ASSESSMENT  Follow up Changes made:  Has been juicing in morning; ginger, pineapple, carrots, cukes, lemon, spinach, green apple. 16 oz. She has been following [email or web address] program and has really enjoyed it.  Eating a lot more whole plant based foods.  Bowels much better and regular. Has been walking 45 minutes a day. GI issues are much  better. No bloating.   Has had multiple people in her immediate family die from various cancers. Family dx of cancer of family members who have passed: Brother Metastatic Prostate cancer, Sister Bilary cancer,  sister uterine cancer,  Lost her nephew recently not from cancer. Her other sister has liver cancer now. Has a niece that has had a recent stroke.  She is working hard on eating whole plant based lifestyle and  minimizing her health risks for cancers. Hasn't had a recheck of her liver enzymes yet but will be seeing Dr. Quillian Quince in the next few weeks for follow up and lab work. 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  Lost 3 lbs. Feels a lot better. No bloated or sluggish feeling. More energy and more active.  Anthropometrics  Wt Readings from Last 3 Encounters:  07/26/22 150 lb (68 kg)  05/23/22 154 lb (69.9 kg)  01/29/22 151 lb 6.4 oz (68.7 kg)   Ht Readings from Last 3 Encounters:  07/26/22 '5\' 3"'$  (1.6 m)  05/23/22 '5\' 3"'$  (1.6 m)  01/29/22 '5\' 3"'$  (1.6 m)   There is no height or weight on file to calculate BMI. '@BMIFA'$ @ Facility age limit for growth %iles is 20 years. Facility age limit for growth %iles is 20 years.    Clinical Medical Hx: Fatty liver, allergies, heart issues, depression, VIt D deficiency,  GERD,  Medications: see chart Labs: No results found for: "HGBA1C"    Latest Ref Rng & Units 05/29/2022   10:20 AM 01/29/2022    2:18 PM 01/27/2022    1:24 PM  CMP  Glucose 70 - 99 mg/dL 100  110  104   BUN 8 - 23 mg/dL '11  9  13   '$ Creatinine 0.44 - 1.00 mg/dL 0.84  0.83  0.98   Sodium 135 - 145 mmol/L 138  139  137   Potassium 3.5 - 5.1 mmol/L 4.0  4.3  3.6   Chloride 98 - 111 mmol/L 107  107  107   CO2 22 - 32 mmol/L '24  26  22   '$ Calcium 8.9 - 10.3 mg/dL 9.9  10.2  9.9   Total Protein 6.5 - 8.1 g/dL  7.0    Total Bilirubin 0.3 - 1.2 mg/dL  0.3    Alkaline Phos 38 - 126 U/L  109    AST 15 - 41 U/L  46    ALT 0 - 44 U/L  69     Lipid Panel     Component Value Date/Time   CHOL 235 (H) 11/25/2020 0707   TRIG 196 (H) 11/25/2020 0707   HDL 50 11/25/2020 0707   CHOLHDL 4.7 11/25/2020 0707   VLDL 39 11/25/2020 0707   LDLCALC 146 (H) 11/25/2020 0707    Notable Signs/Symptoms: None  Lifestyle & Dietary Hx Works as an Therapist, 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 Cooks some at home  Estimated daily fluid intake: 16 oz Supplements: Vit D Sleep: varies Stress / self-care: her work Current average weekly physical activity: ADL. Is active on her job walking.  24-Hr Dietary Recall First Meal: 16 oz homemade juice of veggies. Snack:  Second Meal: Spring mix with homemade dressing and 2 boiled eggs, pecans, feta and raisin and chicken, water or zero gatorade Snack:  Third Meal:Chicken lomein 1/2-1 cup, water Snack:  Beverages: water 60 oz.,  Estimated Energy Needs Calories: .1500 Carbohydrate: 170g Protein: 112g Fat: 42g   NUTRITION DIAGNOSIS  NB-1.1 Food and nutrition-related knowledge deficit As related to diet high in processed sugars , fats and fast foods.  As evidenced by Fatty liver and Hyperlipidemia and weight gain.Marland Kitchen   NUTRITION INTERVENTION  Nutrition education (E-1) on the following topics:  Lifestyle Medicine  - Whole  Food, Plant Predominant Nutrition is highly recommended: Eat Plenty of vegetables, Mushrooms, fruits, Legumes, Whole Grains, Nuts, seeds in lieu of processed meats, processed snacks/pastries red meat, poultry, eggs.    -It is better to avoid simple carbohydrates including: Cakes, Sweet Desserts, Ice Cream, Soda (diet and regular), Sweet Tea, Candies, Chips, Cookies, Store Bought Juices, Alcohol in Excess of  1-2 drinks a day, Lemonade,  Artificial Sweeteners, Doughnuts, Coffee Creamers, "Sugar-free" Products, etc, etc.  This is not a complete list.....  Exercise: If you are able: 30 -60 minutes a day ,4 days a week, or 150 minutes a week.  The longer the better.  Combine stretch, strength, and aerobic activities.  If you were told in the past that you have high risk for cardiovascular diseases, you may seek evaluation by your heart doctor prior to initiating moderate to intense exercise programs.   Handouts Provided Include  Lifestyle Medicine.  Learning Style & Readiness for Change Teaching method utilized: Visual & Auditory  Demonstrated degree of understanding via: Teach Back  Barriers to learning/adherence to lifestyle change: None  Goals Established by Pt Goals  Work on eating healthier breakfast of overnight oats Get rid of juices and only drink water. Focus on more whole plant based foods Choose Healthy Choice Power Bowl Meals.   MONITORING & EVALUATION Dietary intake, weekly physical activity, in  PRN  Next Steps  Patient is to work on meal planning and food prepping.Marland Kitchen

## 2022-08-28 NOTE — Patient Instructions (Signed)
Keep up the great job. Keep focusing on whole plant based foods Keep exercising 150 minutes or more weekly. Focus on high fiber foods. Drink a gallon of water per day.

## 2022-08-30 ENCOUNTER — Encounter: Payer: Self-pay | Admitting: Radiology

## 2022-09-30 ENCOUNTER — Other Ambulatory Visit (HOSPITAL_COMMUNITY): Payer: Self-pay

## 2022-09-30 ENCOUNTER — Other Ambulatory Visit: Payer: Self-pay | Admitting: Cardiology

## 2022-09-30 ENCOUNTER — Other Ambulatory Visit: Payer: Self-pay | Admitting: Allergy & Immunology

## 2022-10-01 ENCOUNTER — Other Ambulatory Visit (HOSPITAL_COMMUNITY): Payer: Self-pay

## 2022-10-01 ENCOUNTER — Other Ambulatory Visit: Payer: Self-pay

## 2022-10-01 MED ORDER — MONTELUKAST SODIUM 10 MG PO TABS
10.0000 mg | ORAL_TABLET | Freq: Every day | ORAL | 3 refills | Status: AC
  Filled 2022-10-01: qty 90, 90d supply, fill #0
  Filled 2023-01-05 – 2023-01-07 (×2): qty 90, 90d supply, fill #1

## 2022-10-01 MED ORDER — HYDRALAZINE HCL 25 MG PO TABS
25.0000 mg | ORAL_TABLET | Freq: Two times a day (BID) | ORAL | 3 refills | Status: DC
  Filled 2022-10-01: qty 180, 90d supply, fill #0
  Filled 2023-01-05 – 2023-01-07 (×2): qty 180, 90d supply, fill #1

## 2022-10-01 MED ORDER — CETIRIZINE HCL 10 MG PO TABS
10.0000 mg | ORAL_TABLET | Freq: Every day | ORAL | 1 refills | Status: AC
  Filled 2022-12-11: qty 90, 90d supply, fill #0
  Filled 2023-03-11: qty 90, 90d supply, fill #1

## 2022-10-01 MED ORDER — DILTIAZEM HCL ER COATED BEADS 240 MG PO CP24
240.0000 mg | ORAL_CAPSULE | Freq: Every day | ORAL | 1 refills | Status: DC
  Filled 2022-10-01 – 2022-10-14 (×2): qty 90, 90d supply, fill #0
  Filled 2023-01-15: qty 90, 90d supply, fill #1

## 2022-10-03 ENCOUNTER — Other Ambulatory Visit (HOSPITAL_COMMUNITY): Payer: Self-pay

## 2022-10-14 ENCOUNTER — Other Ambulatory Visit (HOSPITAL_COMMUNITY): Payer: Self-pay

## 2022-10-15 ENCOUNTER — Other Ambulatory Visit: Payer: Self-pay

## 2022-11-12 ENCOUNTER — Encounter: Payer: Self-pay | Admitting: Orthopedic Surgery

## 2022-11-12 ENCOUNTER — Ambulatory Visit (INDEPENDENT_AMBULATORY_CARE_PROVIDER_SITE_OTHER): Payer: Commercial Managed Care - PPO | Admitting: Orthopedic Surgery

## 2022-11-12 DIAGNOSIS — G5782 Other specified mononeuropathies of left lower limb: Secondary | ICD-10-CM

## 2022-11-12 NOTE — Progress Notes (Unsigned)
Office Visit Note   Patient: Rose Delgado           Date of Birth: [birth year]           MRN: [id number] Visit Date: 11/12/2022 Requested by: Richardean Chimera, MD [address] [place],  [place] [address] PCP: Richardean Chimera, MD  Subjective: Chief Complaint  Patient presents with   Left Knee - Pain    HPI: Rose Delgado is a 62 y.o. female who presents to the office reporting left leg pain numbness and tingling.  She did have a hydrodissection around her medial incision.  Initially after surgery her entire leg was numb in the saphenous distribution from about mid thigh down.  That is improved some over the past year.  She is about a year and several months out.  Her knee is stable.  This is affecting her job in terms of being able to get up and around.  She is currently working in cardiac rehab.  Also at night.  She has been walking.  Really could not take any gabapentin or Lyrica..                ROS: All systems reviewed are negative as they relate to the chief complaint within the history of present illness.  Patient denies fevers or chills.  Assessment & Plan: Visit Diagnoses: No diagnosis found.  Plan: Impression is saphenous neuritis left leg.  I think based on how proximal it was it is unlikely that this is really coming from the knee or anywhere distal.  Has improved some but I think 2 years would be the max that it would take for improvement.  I will think there is any surgical indication unless we can localize a little bit clear where the problem originated from.  Based on immediate postop issues I think it is most likely coming from the adductor canal block region.  Again she had saphenous neuritis extending from about mid thigh down to her feet.  The area of the proximal medial tibia really is in the vicinity of branches only of that saphenous nerve.  We will see her back in 6 months.  I will text with Merlyn Albert to see where he or if he can localize the precise location of the saphenous  neuritis.  Follow-Up Instructions: No follow-ups on file.   Orders:  No orders of the defined types were placed in this encounter.  No orders of the defined types were placed in this encounter.     Procedures: No procedures performed   Clinical Data: No additional findings.  Objective: Vital Signs: LMP 07/29/2017   Physical Exam:  Constitutional: Patient appears well-developed HEENT:  Head: Normocephalic Eyes:EOM are normal Neck: Normal range of motion Cardiovascular: Normal rate Pulmonary/chest: Effort normal Neurologic: Patient is alert Skin: Skin is warm Psychiatric: Patient has normal mood and affect  Ortho Exam: Ortho exam demonstrates full range of motion of that left knee.  Does have some paresthesias in the saphenous distribution.  No effusion in the knee.  Range of motion is excellent in that knee with good stability to varus valgus stress as well as with ACL and PCL testing.  No real Tinel's around the proximal medial incision or the medial joint line.  Specialty Comments:  No specialty comments available.  Imaging: No results found.   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 02/16/2016   Nausea with vomiting 07/14/2014   Dysgeusia 03/11/2014   GERD (gastroesophageal reflux disease) 03/11/2014   Hot flashes 03/09/2014   Perimenopause 03/09/2014   Fibroids  03/09/2014   Past Medical History:  Diagnosis Date   Allergic rhinitis    Angio-edema    Fibroids    uterine   GERD (gastroesophageal reflux disease)    Hypertension    Irregular heart beat    Migraines    Nausea with vomiting 07/14/2014   Perimenopause 03/09/2014   Postmenopausal 2016   Urticaria     Family History  Problem Relation Age of Onset   Other Father        aneursym   Other Mother        glaucoma; pacemaker   Hypertension Mother    Cancer Brother        lung   Hypertension Brother    Sleep apnea Brother    Hypertension Brother    Hypertension Brother    Cancer Sister        biliary, deceased age 42   Hypertension Sister    Hypertension Sister    Hypertension Sister    Hypertension Sister    Colon cancer Neg Hx    Allergic rhinitis Neg Hx    Asthma Neg Hx     Past Surgical History:  Procedure Laterality Date   ANTERIOR CRUCIATE LIGAMENT REPAIR Left 07/18/2021   Procedure: LEFT KNEE ANTERIOR CRUCIATE LIGAMENT RECONSTRUCTION, HAMSTRING AUTOGRAFT, MENISCAL DEBRIDEMENT;  Surgeon: Cammy Copa, MD;  Location: [facility];  Service: Orthopedics;  Laterality: Left;   BREAST BIOPSY Left 2008   benign   COLONOSCOPY  06/2011   Dr. Teena Dunk: per patient it was normal, follow up in 10 years.    ESOPHAGOGASTRODUODENOSCOPY     Dr. Sabino Dick: late 1990s/early 2000   ESOPHAGOGASTRODUODENOSCOPY N/A 05/19/2014   SLF:NO OBVIOUS SOURCE FOR DYSGEUSIA IDENTIFED/MILD Non-erosive gastritis   INCISION AND DRAINAGE Right 07/31/2017   Procedure: INCISION AND DRAINAGE RIGHT THUMB NAIL BED;  Surgeon: Dairl Ponder, MD;  Location: [facility];  Service: Orthopedics;  Laterality: Right;   INGUINAL HERNIA REPAIR     KNEE SURGERY Right    NAILBED REPAIR Right 07/31/2017   Procedure: NAILBED BIOPSY;  Surgeon: Dairl Ponder, MD;  Location: [facility];  Service: Orthopedics;  Laterality: Right;   TONSILLECTOMY AND ADENOIDECTOMY     Social History    Occupational History   Occupation: Charity fundraiser at [employer]    Employer: [employer]  Tobacco Use   Smoking status: Never   Smokeless tobacco: Never  Vaping Use   Vaping Use: Never used  Substance and Sexual Activity   Alcohol use: No   Drug use: No   Sexual activity: Yes    Birth control/protection: Post-menopausal, None

## 2022-11-21 ENCOUNTER — Encounter: Payer: Self-pay | Admitting: Cardiology

## 2022-11-21 ENCOUNTER — Ambulatory Visit: Payer: Commercial Managed Care - PPO | Attending: Cardiology | Admitting: Cardiology

## 2022-11-21 VITALS — BP 126/70 | HR 60 | Ht 63.0 in | Wt 150.2 lb

## 2022-11-21 DIAGNOSIS — Z79899 Other long term (current) drug therapy: Secondary | ICD-10-CM | POA: Diagnosis not present

## 2022-11-21 DIAGNOSIS — Z13220 Encounter for screening for lipoid disorders: Secondary | ICD-10-CM

## 2022-11-21 DIAGNOSIS — R002 Palpitations: Secondary | ICD-10-CM | POA: Diagnosis not present

## 2022-11-21 DIAGNOSIS — Z1329 Encounter for screening for other suspected endocrine disorder: Secondary | ICD-10-CM

## 2022-11-21 DIAGNOSIS — I10 Essential (primary) hypertension: Secondary | ICD-10-CM | POA: Diagnosis not present

## 2022-11-21 DIAGNOSIS — R7301 Impaired fasting glucose: Secondary | ICD-10-CM | POA: Diagnosis not present

## 2022-11-21 NOTE — Patient Instructions (Addendum)
Medication Instructions:  Your physician recommends that you continue on your current medications as directed. Please refer to the Current Medication list given to you today.  Labwork: Your physician recommends that you return for a FASTING lipid profile, CMET, TSH, Magnesium, CBC & HgA1C. Please do not eat or drink for at least 8 hours when you have this done. You may take your medications that morning with a sip of water. [facility]  Testing/Procedures: none  Follow-Up: Your physician recommends that you schedule a follow-up appointment in: 6 months  Any Other Special Instructions Will Be Listed Below (If Applicable).  If you need a refill on your cardiac medications before your next appointment, please call your pharmacy.

## 2022-11-21 NOTE — Progress Notes (Signed)
Clinical Summary Rose Delgado is a 62 y.o.female seen today for follow up of the following medical problems.        Palpitations -prior monitoring showed no significant arrhythmias  - has been on diltiazem   - infrequent symptoms, overall doing well   2.HTN - compliant with meds - home bp's 120s/70s     3.SOB -last visit reported ongoing SOB since she had covid in 2020 - 08/2020 echo LVEF >75%, no WMAs, indet diastolic, normal RV    - denies recent symptoms   4. Chest pain - 12/2021 ER visit with chest pain. Negative workup for ACS, Ddimer neg - 01/2022 CTA negative for acute aortic pathology   Coronary CTA unable to complete test, HR too high and issues with medication injections.  - no recent chest pains    5. Lung nodule - small 4 mm, from reprot does not require monitoring.   6. 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 Sister just recent passed away   Past Medical History:  Diagnosis Date   Allergic rhinitis    Angio-edema    Fibroids    uterine   GERD (gastroesophageal reflux disease)    Hypertension    Irregular heart beat    Migraines    Nausea with vomiting 07/14/2014   Perimenopause 03/09/2014   Postmenopausal 2016   Urticaria      Allergies  Allergen Reactions   Metoprolol Anaphylaxis   Shellfish Allergy Anaphylaxis   Flagyl [Metronidazole] Nausea And Vomiting   Gabapentin (Once-Daily) Other (See Comments)   Lactose Intolerance (Gi) Nausea And Vomiting    And diarrhea   Losartan Swelling   Topamax [Topiramate] Other (See Comments)    "feeling of doom"   Ciprofloxacin Hcl Nausea Only   Diflucan [Fluconazole] Nausea And Vomiting   Latex Rash     Current Outpatient Medications  Medication Sig Dispense Refill   Azelastine HCl 137 MCG/SPRAY SOLN Place 2 sprays into the nose 2 (two) times daily as needed. 90 mL 3   Biotin 5 MG CAPS Take 5 mg by mouth daily.      celecoxib (CELEBREX) 100 MG capsule Take 1 capsule (100 mg total) by mouth daily as needed. 30 capsule 0   cetirizine (ZYRTEC) 10 MG tablet Take 1 tablet (10 mg total) by mouth daily. 90 tablet 1   cyclobenzaprine (FLEXERIL) 10 MG tablet Take 1 tablet by mouth every 8 (eight) hours as needed.  0   diltiazem (CARDIZEM CD) 240 MG 24 hr capsule Take 1 capsule (240 mg total) by mouth daily. 90 capsule 1   diltiazem (CARDIZEM) 30 MG tablet Take 1 tablet by mouth 2 times daily as needed (for breakthrough palpitations). 60 tablet 3   EPINEPHrine 0.3 mg/0.3 mL IJ SOAJ injection Use as directed for severe allergic reaction. 2 each 2   ergocalciferol (VITAMIN D2) 1.25 MG (50000 UT) capsule Take 2 capsules by mouth once a week 24 capsule 4   famotidine (PEPCID) 20 MG tablet Take 1 tablet (20 mg total) by mouth 2 (two) times daily. 90 tablet 2   fluticasone (FLONASE) 50 MCG/ACT nasal spray USE 2 SPRAY INTO EACH NOSTRIL EVERY DAY 48 g 3   hydrALAZINE (APRESOLINE) 25 MG tablet Take 1 tablet (25 mg total) by mouth 2 (two) times daily. 180 tablet 3   Magnesium 250 MG TABS Take 250 mg by mouth daily.  methocarbamol (ROBAXIN) 500 MG tablet Take 500 mg by mouth every 8 (eight) hours as needed for muscle spasms.     methylPREDNISolone (MEDROL DOSEPAK) 4 MG TBPK tablet Take per packet instructions. 1 each 0   montelukast (SINGULAIR) 10 MG tablet Take 1 tablet (10 mg total) by mouth daily. 90 tablet 3   Multiple Vitamins-Minerals (SM COMPLETE 50+) TABS Take 1 tablet by mouth daily.     ondansetron (ZOFRAN) 4 MG tablet Take 1 tablet (4 mg total) by mouth every 4 (four) hours as needed for nausea or vomiting. 30 tablet 2   pantoprazole (PROTONIX) 40 MG tablet TAKE 1 TABLET BY MOUTH DAILY FOR ACID REFLUX 90 tablet 1   Potassium Chloride ER 20 MEQ TBCR Take 1 tablet (20 mEq) by mouth daily. 90 tablet 3   Pyridoxine HCl (VITAMIN B6 PO) Take 1 tablet by mouth daily.     rizatriptan (MAXALT-MLT) 10 MG disintegrating  tablet Take 10 mg by mouth as needed for migraine. May repeat in 2 hours if needed     No current facility-administered medications for this visit.     Past Surgical History:  Procedure Laterality Date   ANTERIOR CRUCIATE LIGAMENT REPAIR Left 07/18/2021   Procedure: LEFT KNEE ANTERIOR CRUCIATE LIGAMENT RECONSTRUCTION, HAMSTRING AUTOGRAFT, MENISCAL DEBRIDEMENT;  Surgeon: Cammy Copa, MD;  Location: [facility];  Service: Orthopedics;  Laterality: Left;   BREAST BIOPSY Left 2008   benign   COLONOSCOPY  06/2011   Dr. Teena Dunk: per patient it was normal, follow up in 10 years.    ESOPHAGOGASTRODUODENOSCOPY     Dr. Sabino Dick: late 1990s/early 2000   ESOPHAGOGASTRODUODENOSCOPY N/A 05/19/2014   SLF:NO OBVIOUS SOURCE FOR DYSGEUSIA IDENTIFED/MILD Non-erosive gastritis   INCISION AND DRAINAGE Right 07/31/2017   Procedure: INCISION AND DRAINAGE RIGHT THUMB NAIL BED;  Surgeon: Dairl Ponder, MD;  Location: [facility];  Service: Orthopedics;  Laterality: Right;   INGUINAL HERNIA REPAIR     KNEE SURGERY Right    NAILBED REPAIR Right 07/31/2017   Procedure: NAILBED BIOPSY;  Surgeon: Dairl Ponder, MD;  Location: [facility];  Service: Orthopedics;  Laterality: Right;   TONSILLECTOMY AND ADENOIDECTOMY       Allergies  Allergen Reactions   Metoprolol Anaphylaxis   Shellfish Allergy Anaphylaxis   Flagyl [Metronidazole] Nausea And Vomiting   Gabapentin (Once-Daily) Other (See Comments)   Lactose Intolerance (Gi) Nausea And Vomiting    And diarrhea   Losartan Swelling   Topamax [Topiramate] Other (See Comments)    "feeling of doom"   Ciprofloxacin Hcl Nausea Only   Diflucan [Fluconazole] Nausea And Vomiting   Latex Rash      Family History  Problem Relation Age of Onset   Other Father        aneursym   Other Mother        glaucoma; pacemaker   Hypertension Mother    Cancer Brother        lung   Hypertension Brother    Sleep apnea Brother     Hypertension Brother    Hypertension Brother    Cancer Sister        biliary, deceased age 59   Hypertension Sister    Hypertension Sister    Hypertension Sister    Hypertension Sister    Colon cancer Neg Hx    Allergic rhinitis Neg Hx    Asthma Neg Hx      Social History Rose Delgado reports that she has never smoked. She  has never used smokeless tobacco. Rose Delgado reports no history of alcohol use.   Review of Systems CONSTITUTIONAL: No weight loss, fever, chills, weakness or fatigue.  HEENT: Eyes: No visual loss, blurred vision, double vision or yellow sclerae.No hearing loss, sneezing, congestion, runny nose or sore throat.  SKIN: No rash or itching.  CARDIOVASCULAR: per hpi RESPIRATORY: No shortness of breath, cough or sputum.  GASTROINTESTINAL: No anorexia, nausea, vomiting or diarrhea. No abdominal pain or blood.  GENITOURINARY: No burning on urination, no polyuria NEUROLOGICAL: No headache, dizziness, syncope, paralysis, ataxia, numbness or tingling in the extremities. No change in bowel or bladder control.  MUSCULOSKELETAL: No muscle, back pain, joint pain or stiffness.  LYMPHATICS: No enlarged nodes. No history of splenectomy.  PSYCHIATRIC: No history of depression or anxiety.  ENDOCRINOLOGIC: No reports of sweating, cold or heat intolerance. No polyuria or polydipsia.  Marland Kitchen   Physical Examination Today's Vitals   11/21/22 1422  BP: 126/70  Pulse: 60  SpO2: 96%  Weight: 150 lb 3.2 oz (68.1 kg)  Height: 5\' 3"  (1.6 m)   Body mass index is 26.61 kg/m.  Gen: resting comfortably, no acute distress HEENT: no scleral icterus, pupils equal round and reactive, no palptable cervical adenopathy,  CV: RRR, no mrg, no jvd Resp:  GI: abdomen is soft, non-tender, non-distended, normal bowel sounds, no hepatosplenomegaly MSK: extremities are warm, no edema.  Skin: warm, no rash Neuro:  no focal deficits Psych: appropriate affect   Diagnostic Studies     Assessment  and Plan  Palpitations - overall doing well, continue diltiazem   2. HTN - bp is at goal, continue current meds   3. Cheat pain/SOB - prior symptoms have resolved, no plans for additional testing at this time - continue to monitor   F/u 6 months  Antoine Poche, M.D.

## 2022-11-22 ENCOUNTER — Other Ambulatory Visit (HOSPITAL_COMMUNITY)
Admission: RE | Admit: 2022-11-22 | Discharge: 2022-11-22 | Disposition: A | Discharge status: Home / Self Care | Payer: Commercial Managed Care - PPO | Source: Ambulatory Visit | Attending: Cardiology | Admitting: Cardiology

## 2022-11-22 ENCOUNTER — Other Ambulatory Visit: Payer: Self-pay

## 2022-11-22 ENCOUNTER — Other Ambulatory Visit (HOSPITAL_COMMUNITY): Payer: Self-pay

## 2022-11-22 DIAGNOSIS — Z79899 Other long term (current) drug therapy: Secondary | ICD-10-CM | POA: Diagnosis not present

## 2022-11-22 DIAGNOSIS — Z1329 Encounter for screening for other suspected endocrine disorder: Secondary | ICD-10-CM | POA: Diagnosis not present

## 2022-11-22 DIAGNOSIS — Z13220 Encounter for screening for lipoid disorders: Secondary | ICD-10-CM | POA: Insufficient documentation

## 2022-11-22 DIAGNOSIS — R002 Palpitations: Secondary | ICD-10-CM | POA: Diagnosis not present

## 2022-11-22 DIAGNOSIS — R7301 Impaired fasting glucose: Secondary | ICD-10-CM | POA: Diagnosis not present

## 2022-11-22 DIAGNOSIS — I10 Essential (primary) hypertension: Secondary | ICD-10-CM | POA: Diagnosis not present

## 2022-11-22 LAB — LIPID PANEL
Cholesterol: 222 mg/dL — ABNORMAL HIGH (ref 0–200)
HDL: 47 mg/dL (ref >40)
LDL Cholesterol: 149 mg/dL — ABNORMAL HIGH (ref 0–99)
Total CHOL/HDL Ratio: 4.7 RATIO
Triglycerides: 130 mg/dL (ref <150)
VLDL: 26 mg/dL (ref 0–40)

## 2022-11-22 LAB — MAGNESIUM: Magnesium: 2.2 mg/dL (ref 1.7–2.4)

## 2022-11-22 LAB — COMPREHENSIVE METABOLIC PANEL
ALT: 24 U/L (ref 0–44)
AST: 20 U/L (ref 15–41)
Albumin: 4.3 g/dL (ref 3.5–5.0)
Alkaline Phosphatase: 121 U/L (ref 38–126)
Anion gap: 10 (ref 5–15)
BUN: 12 mg/dL (ref 8–23)
CO2: 22 mmol/L (ref 22–32)
Calcium: 9.8 mg/dL (ref 8.9–10.3)
Chloride: 104 mmol/L (ref 98–111)
Creatinine, Ser: 0.88 mg/dL (ref 0.44–1.00)
GFR, Estimated: >60 mL/min (ref >60)
Glucose, Bld: 109 mg/dL — ABNORMAL HIGH (ref 70–99)
Potassium: 4.2 mmol/L (ref 3.5–5.1)
Sodium: 136 mmol/L (ref 135–145)
Total Bilirubin: 0.5 mg/dL (ref 0.3–1.2)
Total Protein: 7.4 g/dL (ref 6.5–8.1)

## 2022-11-22 LAB — CBC
HCT: 42.6 % (ref 36.0–46.0)
Hemoglobin: 14.1 g/dL (ref 12.0–15.0)
MCH: 30.7 pg (ref 26.0–34.0)
MCHC: 33.1 g/dL (ref 30.0–36.0)
MCV: 92.8 fL (ref 80.0–100.0)
Platelets: 279 10*3/uL (ref 150–400)
RBC: 4.59 MIL/uL (ref 3.87–5.11)
RDW: 12.2 % (ref 11.5–15.5)
WBC: 5.8 10*3/uL (ref 4.0–10.5)
nRBC: 0 % (ref 0.0–0.2)

## 2022-11-22 LAB — TSH: TSH: 0.975 u[IU]/mL (ref 0.350–4.500)

## 2022-11-22 MED ORDER — POLYETHYLENE GLYCOL 3350 17 GM/SCOOP PO POWD
ORAL | 6 refills | Status: DC
  Filled 2022-11-22: qty 952, fill #0
  Filled 2022-11-23: qty 952, 28d supply, fill #0
  Filled 2022-12-11: qty 952, 28d supply, fill #1

## 2022-11-23 ENCOUNTER — Other Ambulatory Visit (HOSPITAL_COMMUNITY): Payer: Self-pay

## 2022-11-23 ENCOUNTER — Other Ambulatory Visit: Payer: Self-pay

## 2022-11-23 LAB — HEMOGLOBIN A1C
Hgb A1c MFr Bld: 6.4 % — ABNORMAL HIGH (ref 4.8–5.6)
Mean Plasma Glucose: 137 mg/dL

## 2022-11-24 ENCOUNTER — Other Ambulatory Visit (HOSPITAL_COMMUNITY): Payer: Self-pay

## 2022-11-27 ENCOUNTER — Other Ambulatory Visit: Payer: Self-pay

## 2022-11-28 ENCOUNTER — Other Ambulatory Visit (HOSPITAL_COMMUNITY): Payer: Self-pay

## 2022-11-28 MED ORDER — POLYETHYLENE GLYCOL 3350 17 GM/SCOOP PO POWD
ORAL | 3 refills | Status: AC
  Filled 2023-03-11: qty 952, 28d supply, fill #0

## 2022-12-03 DIAGNOSIS — H43813 Vitreous degeneration, bilateral: Secondary | ICD-10-CM | POA: Diagnosis not present

## 2022-12-03 DIAGNOSIS — H40023 Open angle with borderline findings, high risk, bilateral: Secondary | ICD-10-CM | POA: Diagnosis not present

## 2022-12-10 DIAGNOSIS — I10 Essential (primary) hypertension: Secondary | ICD-10-CM | POA: Diagnosis not present

## 2022-12-10 DIAGNOSIS — Z6825 Body mass index (BMI) 25.0-25.9, adult: Secondary | ICD-10-CM | POA: Diagnosis not present

## 2022-12-10 DIAGNOSIS — E559 Vitamin D deficiency, unspecified: Secondary | ICD-10-CM | POA: Diagnosis not present

## 2022-12-10 DIAGNOSIS — M5416 Radiculopathy, lumbar region: Secondary | ICD-10-CM | POA: Diagnosis not present

## 2022-12-10 DIAGNOSIS — J301 Allergic rhinitis due to pollen: Secondary | ICD-10-CM | POA: Diagnosis not present

## 2022-12-10 DIAGNOSIS — K760 Fatty (change of) liver, not elsewhere classified: Secondary | ICD-10-CM | POA: Diagnosis not present

## 2022-12-10 DIAGNOSIS — E7849 Other hyperlipidemia: Secondary | ICD-10-CM | POA: Diagnosis not present

## 2022-12-10 DIAGNOSIS — G43909 Migraine, unspecified, not intractable, without status migrainosus: Secondary | ICD-10-CM | POA: Diagnosis not present

## 2022-12-11 ENCOUNTER — Ambulatory Visit (HOSPITAL_COMMUNITY)
Admission: RE | Admit: 2022-12-11 | Discharge: 2022-12-11 | Disposition: A | Discharge status: Home / Self Care | Payer: Commercial Managed Care - PPO | Source: Ambulatory Visit | Attending: Cardiology | Admitting: Cardiology

## 2022-12-11 ENCOUNTER — Other Ambulatory Visit: Payer: Self-pay | Admitting: Allergy & Immunology

## 2022-12-11 ENCOUNTER — Other Ambulatory Visit: Payer: Self-pay | Admitting: *Deleted

## 2022-12-11 ENCOUNTER — Other Ambulatory Visit: Payer: Self-pay

## 2022-12-11 ENCOUNTER — Other Ambulatory Visit (HOSPITAL_COMMUNITY): Payer: Self-pay

## 2022-12-11 DIAGNOSIS — R072 Precordial pain: Secondary | ICD-10-CM | POA: Insufficient documentation

## 2022-12-11 NOTE — Progress Notes (Signed)
Received a msg from Dr. Wyline Mood requesting coronary calcium score. Order placed.

## 2022-12-17 ENCOUNTER — Other Ambulatory Visit (HOSPITAL_COMMUNITY): Payer: Self-pay

## 2022-12-23 ENCOUNTER — Other Ambulatory Visit: Payer: Self-pay | Admitting: Allergy & Immunology

## 2022-12-23 ENCOUNTER — Other Ambulatory Visit: Payer: Self-pay | Admitting: Family Medicine

## 2022-12-23 ENCOUNTER — Other Ambulatory Visit (HOSPITAL_COMMUNITY): Payer: Self-pay

## 2022-12-24 ENCOUNTER — Other Ambulatory Visit (HOSPITAL_COMMUNITY): Payer: Self-pay

## 2022-12-24 ENCOUNTER — Other Ambulatory Visit: Payer: Self-pay

## 2022-12-24 MED ORDER — FLUTICASONE PROPIONATE 50 MCG/ACT NA SUSP
2.0000 | Freq: Every day | NASAL | 3 refills | Status: AC
  Filled 2022-12-24: qty 48, 90d supply, fill #0

## 2022-12-24 MED ORDER — PANTOPRAZOLE SODIUM 40 MG PO TBEC
40.0000 mg | DELAYED_RELEASE_TABLET | Freq: Every day | ORAL | 1 refills | Status: DC
  Filled 2022-12-24: qty 90, 90d supply, fill #0

## 2022-12-27 ENCOUNTER — Telehealth: Payer: Self-pay | Admitting: Orthopedic Surgery

## 2022-12-27 MED ORDER — CELECOXIB 100 MG PO CAPS: 100.0000 mg | ORAL_CAPSULE | Freq: Every day | ORAL | 0 refills | Status: DC | PRN

## 2022-12-27 MED ORDER — METHOCARBAMOL 500 MG PO TABS: 500.0000 mg | ORAL_TABLET | Freq: Three times a day (TID) | ORAL | 0 refills | Status: AC | PRN

## 2022-12-27 NOTE — Telephone Encounter (Signed)
IC LMVM for patient and advised submitted.

## 2022-12-27 NOTE — Telephone Encounter (Signed)
Patient called needing a Rx for Celebrex and a muscle relaxer sent to her pharmacy. Patient said she uses [facility] in [place]. The number to contact [phone]

## 2022-12-27 NOTE — Telephone Encounter (Signed)
Yes  thx

## 2023-01-05 ENCOUNTER — Other Ambulatory Visit: Payer: Self-pay | Admitting: Allergy & Immunology

## 2023-01-07 ENCOUNTER — Other Ambulatory Visit: Payer: Self-pay

## 2023-01-12 ENCOUNTER — Other Ambulatory Visit (HOSPITAL_COMMUNITY): Payer: Self-pay

## 2023-01-15 ENCOUNTER — Other Ambulatory Visit: Payer: Self-pay | Admitting: Allergy & Immunology

## 2023-01-15 ENCOUNTER — Other Ambulatory Visit: Payer: Self-pay

## 2023-01-16 ENCOUNTER — Other Ambulatory Visit (HOSPITAL_COMMUNITY): Payer: Self-pay

## 2023-01-21 ENCOUNTER — Ambulatory Visit: Payer: Commercial Managed Care - PPO | Admitting: Orthopedic Surgery

## 2023-01-27 ENCOUNTER — Other Ambulatory Visit: Payer: Self-pay | Admitting: Allergy & Immunology

## 2023-01-30 ENCOUNTER — Ambulatory Visit (HOSPITAL_COMMUNITY): Payer: Commercial Managed Care - PPO

## 2023-01-31 ENCOUNTER — Ambulatory Visit (INDEPENDENT_AMBULATORY_CARE_PROVIDER_SITE_OTHER): Payer: Commercial Managed Care - PPO | Admitting: Obstetrics & Gynecology

## 2023-01-31 ENCOUNTER — Encounter: Payer: Self-pay | Admitting: Obstetrics & Gynecology

## 2023-01-31 ENCOUNTER — Ambulatory Visit: Payer: Commercial Managed Care - PPO | Admitting: Obstetrics & Gynecology

## 2023-01-31 ENCOUNTER — Other Ambulatory Visit (HOSPITAL_COMMUNITY)
Admission: RE | Admit: 2023-01-31 | Discharge: 2023-01-31 | Disposition: A | Discharge status: Home / Self Care | Payer: Commercial Managed Care - PPO | Source: Ambulatory Visit | Attending: Obstetrics & Gynecology | Admitting: Obstetrics & Gynecology

## 2023-01-31 VITALS — BP 150/71 | HR 67 | Ht 63.0 in | Wt 148.0 lb

## 2023-01-31 DIAGNOSIS — Z01419 Encounter for gynecological examination (general) (routine) without abnormal findings: Secondary | ICD-10-CM

## 2023-01-31 NOTE — Progress Notes (Signed)
Subjective:     Rose Delgado is a 62 y.o. female here for a routine exam.  Patient's last menstrual period was 07/29/2017. G0P0 Birth Control Method:  menopausal Menstrual Calendar(currently): amenorrhea  Current complaints: none, recent death of several siblings.   Current acute medical issues:  none acute   Recent Gynecologic History Patient's last menstrual period was 07/29/2017. Last Pap: 6/23-->request for one today,  normal Last mammogram: 07/05/22,  normal  Past Medical History:  Diagnosis Date   Allergic rhinitis    Angio-edema    Fibroids    uterine   GERD (gastroesophageal reflux disease)    Hypertension    Irregular heart beat    Migraines    Nausea with vomiting 07/14/2014   Perimenopause 03/09/2014   Postmenopausal 2016   Urticaria     Past Surgical History:  Procedure Laterality Date   ANTERIOR CRUCIATE LIGAMENT REPAIR Left 07/18/2021   Procedure: LEFT KNEE ANTERIOR CRUCIATE LIGAMENT RECONSTRUCTION, HAMSTRING AUTOGRAFT, MENISCAL DEBRIDEMENT;  Surgeon: Cammy Copa, MD;  Location: [facility];  Service: Orthopedics;  Laterality: Left;   BREAST BIOPSY Left 2008   benign   COLONOSCOPY  06/2011   Dr. Teena Dunk: per patient it was normal, follow up in 10 years.    ESOPHAGOGASTRODUODENOSCOPY     Dr. Sabino Dick: late 1990s/early 2000   ESOPHAGOGASTRODUODENOSCOPY N/A 05/19/2014   SLF:NO OBVIOUS SOURCE FOR DYSGEUSIA IDENTIFED/MILD Non-erosive gastritis   INCISION AND DRAINAGE Right 07/31/2017   Procedure: INCISION AND DRAINAGE RIGHT THUMB NAIL BED;  Surgeon: Dairl Ponder, MD;  Location: [facility];  Service: Orthopedics;  Laterality: Right;   INGUINAL HERNIA REPAIR     KNEE SURGERY Right    NAILBED REPAIR Right 07/31/2017   Procedure: NAILBED BIOPSY;  Surgeon: Dairl Ponder, MD;  Location: [facility];  Service: Orthopedics;  Laterality: Right;   TONSILLECTOMY AND ADENOIDECTOMY      OB History     Gravida  0   Para       Term      Preterm      AB      Living         SAB      IAB      Ectopic      Multiple      Live Births              Social History   Socioeconomic History   Marital status: Single    Spouse name: Not on file   Number of children: Not on file   Years of education: Not on file   Highest education level: Not on file  Occupational History   Occupation: Charity fundraiser at [employer]    Employer: [employer]  Tobacco Use   Smoking status: Never   Smokeless tobacco: Never  Vaping Use   Vaping status: Never Used  Substance and Sexual Activity   Alcohol use: No   Drug use: No   Sexual activity: Yes    Birth control/protection: Post-menopausal, None  Other Topics Concern   Not on file  Social History Narrative   Not on file   Social Determinants of Health   Financial Resource Strain: Low Risk  (01/31/2023)   Overall Financial Resource Strain (CARDIA)    Difficulty of Paying Living Expenses: Not hard at all  Food Insecurity: No Food Insecurity (01/31/2023)   Hunger Vital Sign    Worried About Running Out of Food in the Last Year: Never true  Ran Out of Food in the Last Year: Never true  Transportation Needs: No Transportation Needs (01/31/2023)   PRAPARE - Administrator, Civil Service (Medical): No    Lack of Transportation (Non-Medical): No  Physical Activity: Insufficiently Active (01/31/2023)   Exercise Vital Sign    Days of Exercise per Week: 3 days    Minutes of Exercise per Session: 30 min  Stress: No Stress Concern Present (01/31/2023)   [employer] of Occupational Health - Occupational Stress Questionnaire    Feeling of Stress : Not at all  Social Connections: Moderately Integrated (01/31/2023)   Social Connection and Isolation Panel [NHANES]    Frequency of Communication with Friends and Family: More than three times a week    Frequency of Social Gatherings with Friends and Family: Twice a week    Attends Religious Services: More than 4 times per year     Active Member of [employer] or Organizations: Yes    Attends Engineer, structural: More than 4 times per year    Marital Status: Divorced    Family History  Problem Relation Age of Onset   Other Father        aneursym   Other Mother        glaucoma; pacemaker   Hypertension Mother    Cancer Brother        lung   Hypertension Brother    Sleep apnea Brother    Hypertension Brother    Hypertension Brother    Cancer Sister        biliary, deceased age 10   Hypertension Sister    Hypertension Sister    Hypertension Sister    Hypertension Sister    Colon cancer Neg Hx    Allergic rhinitis Neg Hx    Asthma Neg Hx      Current Outpatient Medications:    Azelastine HCl 137 MCG/SPRAY SOLN, Place 2 sprays into the nose 2 (two) times daily as needed., Disp: 90 mL, Rfl: 3   Biotin 5 MG CAPS, Take 5 mg by mouth daily., Disp: , Rfl:    celecoxib (CELEBREX) 100 MG capsule, Take 1 capsule (100 mg total) by mouth daily as needed., Disp: 30 capsule, Rfl: 0   cetirizine (ZYRTEC) 10 MG tablet, Take 1 tablet (10 mg total) by mouth daily., Disp: 90 tablet, Rfl: 1   cyclobenzaprine (FLEXERIL) 10 MG tablet, Take 1 tablet by mouth every 8 (eight) hours as needed., Disp: , Rfl: 0   diltiazem (CARDIZEM CD) 240 MG 24 hr capsule, Take 1 capsule (240 mg total) by mouth daily., Disp: 90 capsule, Rfl: 1   diltiazem (CARDIZEM) 30 MG tablet, Take 1 tablet by mouth 2 times daily as needed (for breakthrough palpitations)., Disp: 60 tablet, Rfl: 3   EPINEPHrine 0.3 mg/0.3 mL IJ SOAJ injection, Use as directed for severe allergic reaction., Disp: 2 each, Rfl: 2   famotidine (PEPCID) 20 MG tablet, Take 1 tablet (20 mg total) by mouth 2 (two) times daily., Disp: 90 tablet, Rfl: 2   fluticasone (FLONASE) 50 MCG/ACT nasal spray, Place 2 sprays into both nostrils daily., Disp: 48 g, Rfl: 3   hydrALAZINE (APRESOLINE) 25 MG tablet, Take 1 tablet (25 mg total) by mouth 2 (two) times daily., Disp: 180 tablet, Rfl:  3   Magnesium 250 MG TABS, Take 250 mg by mouth daily., Disp: , Rfl:    methocarbamol (ROBAXIN) 500 MG tablet, Take 1 tablet (500 mg total) by mouth every 8 (eight)  hours as needed for muscle spasms., Disp: 30 tablet, Rfl: 0   montelukast (SINGULAIR) 10 MG tablet, Take 1 tablet (10 mg total) by mouth daily., Disp: 90 tablet, Rfl: 3   Multiple Vitamins-Minerals (SM COMPLETE 50+) TABS, Take 1 tablet by mouth daily., Disp: , Rfl:    ondansetron (ZOFRAN) 4 MG tablet, Take 1 tablet (4 mg total) by mouth every 4 (four) hours as needed for nausea or vomiting., Disp: 30 tablet, Rfl: 2   pantoprazole (PROTONIX) 40 MG tablet, Take 1 tablet (40 mg total) by mouth daily for acid reflux, Disp: 90 tablet, Rfl: 1   polyethylene glycol powder (GLYCOLAX/MIRALAX) 17 GM/SCOOP powder, DISSOLVE 17 GRAMS (1 CAPFUL) IN LIQUID AND DRINK BY MOUTH TWICE DAILY FOR CONSTIPATION, Disp: 952 g, Rfl: 3   Potassium Chloride ER 20 MEQ TBCR, Take 1 tablet (20 mEq) by mouth daily., Disp: 90 tablet, Rfl: 3   rizatriptan (MAXALT-MLT) 10 MG disintegrating tablet, Take 10 mg by mouth as needed for migraine. May repeat in 2 hours if needed, Disp: , Rfl:    polyethylene glycol powder (GLYCOLAX/MIRALAX) 17 GM/SCOOP powder, DISSOLVE 17 GRAMS (1 CAPFUL) IN LIQUID AND DRINK BY MOUTH TWICE DAILY FOR CONSTIPATION, Disp: 952 g, Rfl: 6   Pyridoxine HCl (VITAMIN B6 PO), Take 1 tablet by mouth daily., Disp: , Rfl:    vitamin B-12 (CYANOCOBALAMIN) 250 MCG tablet, Take 250 mcg by mouth daily., Disp: , Rfl:   Review of Systems  Review of Systems  Constitutional: Negative for fever, chills, weight loss, malaise/fatigue and diaphoresis.  HENT: Negative for hearing loss, ear pain, nosebleeds, congestion, sore throat, neck pain, tinnitus and ear discharge.   Eyes: Negative for blurred vision, double vision, photophobia, pain, discharge and redness.  Respiratory: Negative for cough, hemoptysis, sputum production, shortness of breath, wheezing and stridor.    Cardiovascular: Negative for chest pain, palpitations, orthopnea, claudication, leg swelling and PND.  Gastrointestinal: negative for abdominal pain. Negative for heartburn, nausea, vomiting, diarrhea, constipation, blood in stool and melena.  Genitourinary: Negative for dysuria, urgency, frequency, hematuria and flank pain.  Musculoskeletal: Negative for myalgias, back pain, joint pain and falls.  Skin: Negative for itching and rash.  Neurological: Negative for dizziness, tingling, tremors, sensory change, speech change, focal weakness, seizures, loss of consciousness, weakness and headaches.  Endo/Heme/Allergies: Negative for environmental allergies and polydipsia. Does not bruise/bleed easily.  Psychiatric/Behavioral: Negative for depression, suicidal ideas, hallucinations, memory loss and substance abuse. The patient is not nervous/anxious and does not have insomnia.        Objective:  Blood pressure (!) 150/71, pulse 67, height 5\' 3"  (1.6 m), weight 148 lb (67.1 kg), last menstrual period 07/29/2017.   Physical Exam  Vitals reviewed. Constitutional: She is oriented to person, place, and time. She appears well-developed and well-nourished.  HENT:  Head: Normocephalic and atraumatic.        Right Ear: External ear normal.  Left Ear: External ear normal.  Nose: Nose normal.  Mouth/Throat: Oropharynx is clear and moist.  Eyes: Conjunctivae and EOM are normal. Pupils are equal, round, and reactive to light. Right eye exhibits no discharge. Left eye exhibits no discharge. No scleral icterus.  Neck: Normal range of motion. Neck supple. No tracheal deviation present. No thyromegaly present.  Cardiovascular: Normal rate, regular rhythm, normal heart sounds and intact distal pulses.  Exam reveals no gallop and no friction rub.   No murmur heard. Respiratory: Effort normal and breath sounds normal. No respiratory distress. She has no wheezes. She has  no rales. She exhibits no tenderness.  GI:  Soft. Bowel sounds are normal. She exhibits no distension and no mass. There is no tenderness. There is no rebound and no guarding.  Genitourinary:  Breasts no masses skin changes or nipple changes bilaterally      Vulva is normal without lesions Vagina is pink moist without discharge Cervix normal in appearance and pap is done Uterus is normal size shape and contour Adnexa is negative with normal sized ovaries  {Rectal    hemoccult negative, normal tone, no masses  Musculoskeletal: Normal range of motion. She exhibits no edema and no tenderness.  Neurological: She is alert and oriented to person, place, and time. She has normal reflexes. She displays normal reflexes. No cranial nerve deficit. She exhibits normal muscle tone. Coordination normal.  Skin: Skin is warm and dry. No rash noted. No erythema. No pallor.  Psychiatric: She has a normal mood and affect. Her behavior is normal. Judgment and thought content normal.       Medications Ordered at today's visit: No orders of the defined types were placed in this encounter.   Other orders placed at today's visit: No orders of the defined types were placed in this encounter.     Assessment:    Normal Gyn exam.    Plan:    Normal exam today recent history of several siblings with cancer: liver, gallbladder, gyn(unsure primary), prostate     No follow-ups on file.

## 2023-02-04 ENCOUNTER — Ambulatory Visit: Payer: Commercial Managed Care - PPO | Admitting: Podiatry

## 2023-02-04 ENCOUNTER — Encounter: Payer: Self-pay | Admitting: Podiatry

## 2023-02-04 DIAGNOSIS — M2041 Other hammer toe(s) (acquired), right foot: Secondary | ICD-10-CM

## 2023-02-04 DIAGNOSIS — L600 Ingrowing nail: Secondary | ICD-10-CM

## 2023-02-04 DIAGNOSIS — D169 Benign neoplasm of bone and articular cartilage, unspecified: Secondary | ICD-10-CM | POA: Diagnosis not present

## 2023-02-04 DIAGNOSIS — M2042 Other hammer toe(s) (acquired), left foot: Secondary | ICD-10-CM

## 2023-02-04 NOTE — Progress Notes (Signed)
Subjective:   Patient ID: Rose Delgado, female   DOB: 62 y.o.   MRN: [id number]   HPI Patient presents stating that medial border of the right big toe has been sore and hard for her to take care of of and the outside of the fifth nails of both feet are bothersome and hard to take care of and she knows she has rotated toes.  Patient does not smoke likes to be active   Review of Systems  All other systems reviewed and are negative.       Objective:  Physical Exam Vitals and nursing note reviewed.  Constitutional:      Appearance: She is well-developed.  Pulmonary:     Effort: Pulmonary effort is normal.  Musculoskeletal:        General: Normal range of motion.  Skin:    General: Skin is warm.  Neurological:     Mental Status: She is alert.     Neurovascular status intact muscle strength adequate range of motion adequate with a painful right hallux medial medial border with big growth Capote it is thick like tissue at the fifth digit bilateral is very rotated with irritation of the lateral nail corner but a lot of it is due to the structure and position of the toe.  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  At this point I went ahead explained the nail procedures organ to do the right 1 first I explained procedure and risk she wants surgery and I went ahead today and I had her sign consent form.  I infiltrated the right hallux and fifth toe 120 mg Xylocaine Marcaine mixture sterile prep done using sterile instrumentation removed the right medial border the fifth lateral border exposed matrix applied phenol 3 applications hallux for applications fifth bed and then applied sterile dressings with instructions on soaks.  Patient wear dressing 24 hours take  them off earlier if throbbing were to occur with all questions answered today and reappoint for correction of the left fifth toe in the next 6 weeks

## 2023-02-04 NOTE — Patient Instructions (Signed)

## 2023-03-11 ENCOUNTER — Ambulatory Visit (INDEPENDENT_AMBULATORY_CARE_PROVIDER_SITE_OTHER): Payer: Commercial Managed Care - PPO | Admitting: Podiatry

## 2023-03-11 ENCOUNTER — Encounter: Payer: Self-pay | Admitting: Podiatry

## 2023-03-11 ENCOUNTER — Other Ambulatory Visit (HOSPITAL_COMMUNITY): Payer: Self-pay

## 2023-03-11 DIAGNOSIS — L600 Ingrowing nail: Secondary | ICD-10-CM

## 2023-03-11 NOTE — Patient Instructions (Signed)

## 2023-03-12 ENCOUNTER — Other Ambulatory Visit: Payer: Self-pay

## 2023-03-12 NOTE — Progress Notes (Signed)
Subjective:   Patient ID: Rose Delgado, female   DOB: 62 y.o.   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

## 2023-03-16 ENCOUNTER — Other Ambulatory Visit (HOSPITAL_COMMUNITY): Payer: Self-pay

## 2023-03-18 DIAGNOSIS — H43813 Vitreous degeneration, bilateral: Secondary | ICD-10-CM | POA: Diagnosis not present

## 2023-03-18 DIAGNOSIS — H2513 Age-related nuclear cataract, bilateral: Secondary | ICD-10-CM | POA: Diagnosis not present

## 2023-03-18 DIAGNOSIS — H40023 Open angle with borderline findings, high risk, bilateral: Secondary | ICD-10-CM | POA: Diagnosis not present

## 2023-04-10 DIAGNOSIS — J029 Acute pharyngitis, unspecified: Secondary | ICD-10-CM | POA: Diagnosis not present

## 2023-04-10 DIAGNOSIS — Z6825 Body mass index (BMI) 25.0-25.9, adult: Secondary | ICD-10-CM | POA: Diagnosis not present

## 2023-04-10 DIAGNOSIS — R030 Elevated blood-pressure reading, without diagnosis of hypertension: Secondary | ICD-10-CM | POA: Diagnosis not present

## 2023-04-18 ENCOUNTER — Other Ambulatory Visit (HOSPITAL_COMMUNITY): Payer: Self-pay

## 2023-04-22 DIAGNOSIS — K760 Fatty (change of) liver, not elsewhere classified: Secondary | ICD-10-CM | POA: Diagnosis not present

## 2023-04-22 DIAGNOSIS — E7849 Other hyperlipidemia: Secondary | ICD-10-CM | POA: Diagnosis not present

## 2023-04-22 DIAGNOSIS — M5416 Radiculopathy, lumbar region: Secondary | ICD-10-CM | POA: Diagnosis not present

## 2023-04-22 DIAGNOSIS — J301 Allergic rhinitis due to pollen: Secondary | ICD-10-CM | POA: Diagnosis not present

## 2023-04-22 DIAGNOSIS — G43909 Migraine, unspecified, not intractable, without status migrainosus: Secondary | ICD-10-CM | POA: Diagnosis not present

## 2023-04-22 DIAGNOSIS — E559 Vitamin D deficiency, unspecified: Secondary | ICD-10-CM | POA: Diagnosis not present

## 2023-04-22 DIAGNOSIS — Z6825 Body mass index (BMI) 25.0-25.9, adult: Secondary | ICD-10-CM | POA: Diagnosis not present

## 2023-04-22 DIAGNOSIS — I10 Essential (primary) hypertension: Secondary | ICD-10-CM | POA: Diagnosis not present

## 2023-05-13 ENCOUNTER — Ambulatory Visit: Payer: BC Managed Care – PPO | Admitting: Orthopedic Surgery

## 2023-05-22 ENCOUNTER — Encounter: Payer: Self-pay | Admitting: Cardiology

## 2023-05-22 ENCOUNTER — Ambulatory Visit: Payer: BC Managed Care – PPO | Attending: Cardiology | Admitting: Cardiology

## 2023-05-22 VITALS — BP 150/90 | HR 64 | Ht 63.0 in | Wt 147.2 lb

## 2023-05-22 DIAGNOSIS — E782 Mixed hyperlipidemia: Secondary | ICD-10-CM

## 2023-05-22 DIAGNOSIS — R002 Palpitations: Secondary | ICD-10-CM

## 2023-05-22 DIAGNOSIS — I10 Essential (primary) hypertension: Secondary | ICD-10-CM

## 2023-05-22 MED ORDER — HYDRALAZINE HCL 25 MG PO TABS: 25.0000 mg | ORAL_TABLET | Freq: Two times a day (BID) | ORAL | 3 refills | Status: DC

## 2023-05-22 MED ORDER — DILTIAZEM HCL ER COATED BEADS 240 MG PO CP24: 240.0000 mg | ORAL_CAPSULE | Freq: Every day | ORAL | 2 refills | Status: DC

## 2023-05-22 MED ORDER — PANTOPRAZOLE SODIUM 40 MG PO TBEC: 40.0000 mg | DELAYED_RELEASE_TABLET | Freq: Every day | ORAL | 3 refills | Status: AC

## 2023-05-22 NOTE — Patient Instructions (Addendum)

## 2023-05-22 NOTE — Progress Notes (Signed)
Clinical Summary Ms. Eide is a 62 y.o.female seen today for follow up of the following medical problems.      Palpitations -prior monitoring showed no significant arrhythmias  - has been on diltiazem   - no recent palpitations  Complaint with meds   2.HTN - home bp's 120-130s/70s   3.SOB -at prior visit eported ongoing SOB since she had covid in 2020 - 08/2020 echo LVEF >75%, no WMAs, indet diastolic, normal RV - no recent symptoms   4. Chest pain - 12/2021 ER visit with chest pain. Negative workup for ACS, Ddimer neg - 01/2022 CTA negative for acute aortic pathology    Coronary CTA unable to complete test, HR too high and issues with medication injections.  - no recent chest pains     5. Lung nodule - small 4 mm, from reprot does not require monitoring.    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 HLD - Her calculated risk of having a stroke or heart attack in the next 10 years is 8.2% which is considered intermediate.  - we discussed starting statin vs coronary calcium score to further determine risk. 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 Sister just recently passed away Past Medical History:  Diagnosis Date   Allergic rhinitis    Angio-edema    Fibroids    uterine   GERD (gastroesophageal reflux disease)    Hypertension    Irregular heart beat    Migraines    Nausea with vomiting 07/14/2014   Perimenopause 03/09/2014   Postmenopausal 2016   Urticaria      Allergies  Allergen Reactions   Metoprolol Anaphylaxis   Shellfish Allergy Anaphylaxis   Flagyl [Metronidazole] Nausea And Vomiting   Gabapentin (Once-Daily) Other (See Comments)   Lactose Intolerance (Gi) Nausea And Vomiting    And diarrhea   Losartan Swelling   Topamax [Topiramate]  Other (See Comments)    "feeling of doom"   Ciprofloxacin Hcl Nausea Only   Diflucan [Fluconazole] Nausea And Vomiting   Latex Rash     Current Outpatient Medications  Medication Sig Dispense Refill   Azelastine HCl 137 MCG/SPRAY SOLN Place 2 sprays into the nose 2 (two) times daily as needed. 90 mL 3   Biotin 5 MG CAPS Take 5 mg by mouth daily.     celecoxib (CELEBREX) 100 MG capsule Take 1 capsule (100 mg total) by mouth daily as needed. 30 capsule 0   cetirizine (ZYRTEC) 10 MG tablet Take 1 tablet (10 mg total) by mouth daily. 90 tablet 1   cyclobenzaprine (FLEXERIL) 10 MG tablet Take 1 tablet by mouth every 8 (eight) hours as needed.  0   diltiazem (CARDIZEM CD) 240 MG 24 hr capsule Take 1 capsule (240 mg total) by mouth daily. 90 capsule 1   diltiazem (CARDIZEM) 30 MG tablet Take 1 tablet by mouth 2 times daily as needed (for breakthrough palpitations). 60 tablet 3   EPINEPHrine 0.3 mg/0.3 mL IJ SOAJ injection Use as directed for severe allergic reaction. 2 each 2   famotidine (PEPCID) 20 MG tablet Take 1 tablet (20 mg total) by mouth 2 (two) times daily. 90 tablet 2   fluticasone (FLONASE) 50 MCG/ACT nasal spray Place 2  sprays into both nostrils daily. 48 g 3   hydrALAZINE (APRESOLINE) 25 MG tablet Take 1 tablet (25 mg total) by mouth 2 (two) times daily. 180 tablet 3   Magnesium 250 MG TABS Take 250 mg by mouth daily.     methocarbamol (ROBAXIN) 500 MG tablet Take 1 tablet (500 mg total) by mouth every 8 (eight) hours as needed for muscle spasms. 30 tablet 0   montelukast (SINGULAIR) 10 MG tablet Take 1 tablet (10 mg total) by mouth daily. 90 tablet 3   Multiple Vitamins-Minerals (SM COMPLETE 50+) TABS Take 1 tablet by mouth daily.     ondansetron (ZOFRAN) 4 MG tablet Take 1 tablet (4 mg total) by mouth every 4 (four) hours as needed for nausea or vomiting. 30 tablet 2   pantoprazole (PROTONIX) 40 MG tablet Take 1 tablet (40 mg total) by mouth daily for acid reflux 90 tablet 1    polyethylene glycol powder (GLYCOLAX/MIRALAX) 17 GM/SCOOP powder DISSOLVE 17 GRAMS (1 CAPFUL) IN LIQUID AND DRINK BY MOUTH TWICE DAILY FOR CONSTIPATION 952 g 3   Potassium Chloride ER 20 MEQ TBCR Take 1 tablet (20 mEq) by mouth daily. 90 tablet 3   rizatriptan (MAXALT-MLT) 10 MG disintegrating tablet Take 10 mg by mouth as needed for migraine. May repeat in 2 hours if needed     No current facility-administered medications for this visit.     Past Surgical History:  Procedure Laterality Date   ANTERIOR CRUCIATE LIGAMENT REPAIR Left 07/18/2021   Procedure: LEFT KNEE ANTERIOR CRUCIATE LIGAMENT RECONSTRUCTION, HAMSTRING AUTOGRAFT, MENISCAL DEBRIDEMENT;  Surgeon: Cammy Copa, MD;  Location: [facility] OR;  Service: Orthopedics;  Laterality: Left;   BREAST BIOPSY Left 2008   benign   COLONOSCOPY  06/2011   Dr. Teena Dunk: per patient it was normal, follow up in 10 years.    ESOPHAGOGASTRODUODENOSCOPY     Dr. Sabino Dick: late 1990s/early 2000   ESOPHAGOGASTRODUODENOSCOPY N/A 05/19/2014   SLF:NO OBVIOUS SOURCE FOR DYSGEUSIA IDENTIFED/MILD Non-erosive gastritis   INCISION AND DRAINAGE Right 07/31/2017   Procedure: INCISION AND DRAINAGE RIGHT THUMB NAIL BED;  Surgeon: Dairl Ponder, MD;  Location: [facility];  Service: Orthopedics;  Laterality: Right;   INGUINAL HERNIA REPAIR     KNEE SURGERY Right    NAILBED REPAIR Right 07/31/2017   Procedure: NAILBED BIOPSY;  Surgeon: Dairl Ponder, MD;  Location: [facility];  Service: Orthopedics;  Laterality: Right;   TONSILLECTOMY AND ADENOIDECTOMY       Allergies  Allergen Reactions   Metoprolol Anaphylaxis   Shellfish Allergy Anaphylaxis   Flagyl [Metronidazole] Nausea And Vomiting   Gabapentin (Once-Daily) Other (See Comments)   Lactose Intolerance (Gi) Nausea And Vomiting    And diarrhea   Losartan Swelling   Topamax [Topiramate] Other (See Comments)    "feeling of doom"   Ciprofloxacin Hcl Nausea Only    Diflucan [Fluconazole] Nausea And Vomiting   Latex Rash      Family History  Problem Relation Age of Onset   Other Father        aneursym   Other Mother        glaucoma; pacemaker   Hypertension Mother    Cancer Brother        lung   Hypertension Brother    Sleep apnea Brother    Hypertension Brother    Hypertension Brother    Cancer Sister        biliary, deceased age 83   Hypertension Sister  Hypertension Sister    Hypertension Sister    Hypertension Sister    Colon cancer Neg Hx    Allergic rhinitis Neg Hx    Asthma Neg Hx      Social History Ms. Sanguino reports that she has never smoked. She has never used smokeless tobacco. Ms. Capanna reports no history of alcohol use.   Review of Systems CONSTITUTIONAL: No weight loss, fever, chills, weakness or fatigue.  HEENT: Eyes: No visual loss, blurred vision, double vision or yellow sclerae.No hearing loss, sneezing, congestion, runny nose or sore throat.  SKIN: No rash or itching.  CARDIOVASCULAR: per hpi RESPIRATORY: No shortness of breath, cough or sputum.  GASTROINTESTINAL: No anorexia, nausea, vomiting or diarrhea. No abdominal pain or blood.  GENITOURINARY: No burning on urination, no polyuria NEUROLOGICAL: No headache, dizziness, syncope, paralysis, ataxia, numbness or tingling in the extremities. No change in bowel or bladder control.  MUSCULOSKELETAL: No muscle, back pain, joint pain or stiffness.  LYMPHATICS: No enlarged nodes. No history of splenectomy.  PSYCHIATRIC: No history of depression or anxiety.  ENDOCRINOLOGIC: No reports of sweating, cold or heat intolerance. No polyuria or polydipsia.  Marland Kitchen   Physical Examination Today's Vitals   05/22/23 1557 05/22/23 1610  BP: (!) 154/90 (!) 148/82  Pulse: 64   SpO2: 96%   Weight: 147 lb 3.2 oz (66.8 kg)   Height: 5\' 3"  (1.6 m)    Body mass index is 26.08 kg/m.  Gen: resting comfortably, no acute distress HEENT: no scleral icterus, pupils equal round  and reactive, no palptable cervical adenopathy,  CV: RRR, no m/rg, no jvd Resp: Clear to auscultation bilaterally GI: abdomen is soft, non-tender, non-distended, normal bowel sounds, no hepatosplenomegaly MSK: extremities are warm, no edema.  Skin: warm, no rash Neuro:  no focal deficits Psych: appropriate affect   Diagnostic Studies     Assessment and Plan  Palpitations - no recent symptoms, doing well on diltiazem - continue current meds - EKG today shows NSR   2. 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 Chest pain/SOB - prior symptoms have resolved, just monitoring at this time  4. HLD - intermediate risk ASCVD score, her coronary calcium score is 0 and thus reasonable not to be on statin at this time.  - continue dietary and lifestyle modification   F/u 6 months      Antoine Poche, M.D.

## 2023-08-20 ENCOUNTER — Other Ambulatory Visit: Payer: Self-pay

## 2023-08-20 MED ORDER — POTASSIUM CHLORIDE ER 20 MEQ PO TBCR: 20.0000 meq | EXTENDED_RELEASE_TABLET | Freq: Every day | ORAL | 3 refills | Status: DC

## 2023-11-18 ENCOUNTER — Other Ambulatory Visit: Payer: Self-pay | Admitting: Orthopedic Surgery

## 2023-11-26 ENCOUNTER — Encounter: Payer: Self-pay | Admitting: Family Medicine

## 2023-11-26 ENCOUNTER — Ambulatory Visit: Payer: BC Managed Care – PPO | Attending: Cardiology | Admitting: Cardiology

## 2023-11-26 ENCOUNTER — Encounter: Payer: Self-pay | Admitting: *Deleted

## 2023-11-26 VITALS — BP 148/78 | HR 73 | Ht 63.0 in | Wt 143.4 lb

## 2023-11-26 DIAGNOSIS — R002 Palpitations: Secondary | ICD-10-CM | POA: Diagnosis not present

## 2023-11-26 DIAGNOSIS — I10 Essential (primary) hypertension: Secondary | ICD-10-CM | POA: Diagnosis not present

## 2023-11-26 DIAGNOSIS — R0789 Other chest pain: Secondary | ICD-10-CM | POA: Diagnosis not present

## 2023-11-26 DIAGNOSIS — E782 Mixed hyperlipidemia: Secondary | ICD-10-CM

## 2023-11-26 MED ORDER — DILTIAZEM HCL ER COATED BEADS 240 MG PO CP24: 240.0000 mg | ORAL_CAPSULE | Freq: Every day | ORAL | 2 refills | Status: AC

## 2023-11-26 MED ORDER — POTASSIUM CHLORIDE ER 20 MEQ PO TBCR: 20.0000 meq | EXTENDED_RELEASE_TABLET | Freq: Every day | ORAL | 2 refills | Status: DC

## 2023-11-26 NOTE — Progress Notes (Signed)
 Clinical Summary Ms. Fagin is a 63 y.o.female seen today for follow up of the following medical problems.      Palpitations -prior monitoring showed no significant arrhythmias  - has been on diltiazem    -denies any recent palpitations - high stress with the loss of several siblings from cancer. Recently got a service dog which is helping.    2.HTN - compliant with meds -often high bp's in clinic but home numbers have been at goal at prior visits - has not checked home bp's recently.    3.SOB -at prior visit eported ongoing SOB since she had covid in 2020 - 08/2020 echo LVEF >75%, no WMAs, indet diastolic, normal RV - no recent SOB   4. Chest pain - 12/2021 ER visit with chest pain. Negative workup for ACS, Ddimer neg - 01/2022 CTA negative for acute aortic pathology    Coronary CTA unable to complete test, HR too high and issues with medication injections.  - no chest pains     5. Lung nodule - small 4 mm, from reprot does not require monitoring.    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 HLD - Her calculated risk of having a stroke or heart attack in the next 10 years is 8.2% which is considered intermediate.  - we discussed starting statin vs coronary calcium score to further determine risk. 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 [employer] Sister just recently passed away Past Medical History:  Diagnosis Date   Allergic rhinitis    Angio-edema    Fibroids    uterine   GERD (gastroesophageal reflux disease)    Hypertension    Irregular heart beat    Migraines    Nausea with vomiting 07/14/2014   Perimenopause 03/09/2014   Postmenopausal 2016   Urticaria      Allergies  Allergen Reactions   Metoprolol  Anaphylaxis   Shellfish Allergy  Anaphylaxis    Flagyl  [Metronidazole ] Nausea And Vomiting   Gabapentin  (Once-Daily) Other (See Comments)   Lactose Intolerance (Gi) Nausea And Vomiting    And diarrhea   Losartan  Swelling   Topamax [Topiramate] Other (See Comments)    "feeling of doom"   Ciprofloxacin Hcl Nausea Only   Diflucan [Fluconazole] Nausea And Vomiting   Latex Rash     Current Outpatient Medications  Medication Sig Dispense Refill   Azelastine  HCl 137 MCG/SPRAY SOLN Place 2 sprays into the nose 2 (two) times daily as needed. 90 mL 3   Biotin 5 MG CAPS Take 5 mg by mouth daily.     celecoxib  (CELEBREX ) 100 MG capsule TAKE 1 CAPSULE BY MOUTH DAILY AS NEEDED 30 capsule 0   cetirizine  (ZYRTEC ) 10 MG tablet Take 1 tablet (10 mg total) by mouth daily. 90 tablet 1   cyclobenzaprine (FLEXERIL) 10 MG tablet Take 1 tablet by mouth every 8 (eight) hours as needed.  0   diltiazem  (CARDIZEM ) 30 MG tablet Take 1 tablet by mouth 2 times daily as needed (for breakthrough palpitations). 60 tablet 3   EPINEPHrine  0.3 mg/0.3 mL IJ SOAJ injection Use as directed for severe allergic reaction. 2 each 2   famotidine  (PEPCID ) 20 MG tablet Take 1 tablet (20 mg total) by mouth 2 (two) times  daily. 90 tablet 2   fluticasone  (FLONASE ) 50 MCG/ACT nasal spray Place 2 sprays into both nostrils daily. 48 g 3   hydrALAZINE  (APRESOLINE ) 25 MG tablet Take 1 tablet (25 mg total) by mouth 2 (two) times daily. 180 tablet 3   Magnesium  250 MG TABS Take 250 mg by mouth daily.     methocarbamol  (ROBAXIN ) 500 MG tablet Take 1 tablet (500 mg total) by mouth every 8 (eight) hours as needed for muscle spasms. 30 tablet 0   montelukast  (SINGULAIR ) 10 MG tablet Take 1 tablet (10 mg total) by mouth daily. 90 tablet 3   Multiple Vitamins-Minerals (SM COMPLETE 50+) TABS Take 1 tablet by mouth daily.     ondansetron  (ZOFRAN ) 4 MG tablet Take 1 tablet (4 mg total) by mouth every 4 (four) hours as needed for nausea or vomiting. 30 tablet 2   pantoprazole  (PROTONIX ) 40 MG tablet  Take 1 tablet (40 mg total) by mouth daily for acid reflux 90 tablet 3   polyethylene glycol powder (GLYCOLAX /MIRALAX ) 17 GM/SCOOP powder DISSOLVE 17 GRAMS (1 CAPFUL) IN LIQUID AND DRINK BY MOUTH TWICE DAILY FOR CONSTIPATION 952 g 3   rizatriptan (MAXALT-MLT) 10 MG disintegrating tablet Take 10 mg by mouth as needed for migraine. May repeat in 2 hours if needed     diltiazem  (CARDIZEM  CD) 240 MG 24 hr capsule Take 1 capsule (240 mg total) by mouth daily. 90 capsule 2   Potassium Chloride  ER 20 MEQ TBCR Take 1 tablet (20 mEq) by mouth daily. 90 tablet 2   No current facility-administered medications for this visit.     Past Surgical History:  Procedure Laterality Date   ANTERIOR CRUCIATE LIGAMENT REPAIR Left 07/18/2021   Procedure: LEFT KNEE ANTERIOR CRUCIATE LIGAMENT RECONSTRUCTION, HAMSTRING AUTOGRAFT, MENISCAL DEBRIDEMENT;  Surgeon: Jasmine Mesi, MD;  Location: [facility];  Service: Orthopedics;  Laterality: Left;   BREAST BIOPSY Left 2008   benign   COLONOSCOPY  06/2011   Dr. Alline Ivans: per patient it was normal, follow up in 10 years.    ESOPHAGOGASTRODUODENOSCOPY     Dr. Aloma Jaksch: late 1990s/early 2000   ESOPHAGOGASTRODUODENOSCOPY N/A 05/19/2014   SLF:NO OBVIOUS SOURCE FOR DYSGEUSIA IDENTIFED/MILD Non-erosive gastritis   INCISION AND DRAINAGE Right 07/31/2017   Procedure: INCISION AND DRAINAGE RIGHT THUMB NAIL BED;  Surgeon: Florida Hurter, MD;  Location: [facility];  Service: Orthopedics;  Laterality: Right;   INGUINAL HERNIA REPAIR     KNEE SURGERY Right    NAILBED REPAIR Right 07/31/2017   Procedure: NAILBED BIOPSY;  Surgeon: Florida Hurter, MD;  Location: [facility];  Service: Orthopedics;  Laterality: Right;   TONSILLECTOMY AND ADENOIDECTOMY       Allergies  Allergen Reactions   Metoprolol  Anaphylaxis   Shellfish Allergy  Anaphylaxis   Flagyl  [Metronidazole ] Nausea And Vomiting   Gabapentin  (Once-Daily) Other (See Comments)   Lactose  Intolerance (Gi) Nausea And Vomiting    And diarrhea   Losartan  Swelling   Topamax [Topiramate] Other (See Comments)    "feeling of doom"   Ciprofloxacin Hcl Nausea Only   Diflucan [Fluconazole] Nausea And Vomiting   Latex Rash      Family History  Problem Relation Age of Onset   Other Father        aneursym   Other Mother        glaucoma; pacemaker   Hypertension Mother    Cancer Brother        lung   Hypertension Brother  Sleep apnea Brother    Hypertension Brother    Hypertension Brother    Cancer Sister        biliary, deceased age 43   Hypertension Sister    Hypertension Sister    Hypertension Sister    Hypertension Sister    Colon cancer Neg Hx    Allergic rhinitis Neg Hx    Asthma Neg Hx      Social History Ms. Reyburn reports that she has never smoked. She has never used smokeless tobacco. Ms. Algeo reports no history of alcohol  use.    Physical Examination Today's Vitals   11/26/23 0840 11/26/23 0900  BP: (!) 140/84 (!) 148/78  Pulse: 73   SpO2: 98%   Weight: 143 lb 6.4 oz (65 kg)   Height: 5\' 3"  (1.6 m)    Body mass index is 25.4 kg/m.  Gen: resting comfortably, no acute distress HEENT: no scleral icterus, pupils equal round and reactive, no palptable cervical adenopathy,  CV: RRR, no mrg, no jvd Resp: Clear to auscultation bilaterally GI: abdomen is soft, non-tender, non-distended, normal bowel sounds, no hepatosplenomegaly MSK: extremities are warm, no edema.  Skin: warm, no rash Neuro:  no focal deficits Psych: appropriate affect   Diagnostic Studies     Assessment and Plan   Palpitations - no recent symptoms, continue diltiazem .    2. HTN -elevated in clinic, however prior pattern has been high bp in clinic and at goal with home numbers.  - she will check home bp's and update us  on Friday, room to titrate hydralazine  if needed.    3. Chest pain/SOB - denies any recent symptoms - continue to monitor   4. HLD -  intermediate risk ASCVD score, her coronary calcium score is 0 and thus reasonable not to be on statin at this time.  - we will continue to monitor, request most recent labs from pcp  F/u 1 year.      Laurann Pollock, M.D.,

## 2023-11-26 NOTE — Patient Instructions (Signed)
 Medication Instructions:   Diltiazem  & Potassium refilled today  Continue all other medications.     Labwork:  none  Testing/Procedures:  none  Follow-Up:  Your physician wants you to follow up in:  1 year.  You should receive a recall letter in the mail about 2 months prior to the time you are due.  If you don't receive this, please call our office to schedule your follow up appointment.      Any Other Special Instructions Will Be Listed Below (If Applicable).  Please keep BP log & notify office on Friday with readings.    If you need a refill on your cardiac medications before your next appointment, please call your pharmacy.

## 2023-11-27 ENCOUNTER — Encounter: Payer: Self-pay | Admitting: Cardiology

## 2023-12-02 ENCOUNTER — Telehealth: Payer: Self-pay | Admitting: Cardiology

## 2023-12-02 NOTE — Telephone Encounter (Signed)
 Called patient for more information on BP readings and medications. She reported that she has been taking 50 mg twice daily since Wednesday. She has been filling some dizziness and reported that she did feel this prior to her visit last week. Took her BP this morning before her medication was 138/81 then took it again when she got to work. 139/81. Advised her that will need to wait at least 2 hours before checking BP again after she has had medications. She stated that she does that. Advised her to stay hydrated as well. Reports that she has been very tired lately and dizzy. Advised her will send over to provider for further recommendations

## 2023-12-02 NOTE — Telephone Encounter (Signed)
 Pt c/o BP issue: STAT if pt c/o blurred vision, one-sided weakness or slurred speech.  STAT if BP is GREATER than 180/120 TODAY.  STAT if BP is LESS than 90/60 and SYMPTOMATIC TODAY  1. What is your BP concern? Pt increased hydrALAZINE  (APRESOLINE ) 25 MG tablet bid to 50mg  bid and bp has still been elevated  2. Have you taken any BP medication today?hydrALAZINE  (APRESOLINE ) 25 MG tablet   3. What are your last 5 BP readings? Taking 25mg  bid-sys 150-170's dia 89-101; taking 50mg  bid-sys126-140's, dia 70-90's  4. Are you having any other symptoms (ex. Dizziness, headache, blurred vision, passed out)? Dizziness, fatigue

## 2023-12-02 NOTE — Telephone Encounter (Signed)
 BP's running a little high, is the dizziness and fatigue new since we increased the dose?  J Braeton Wolgamott MD

## 2023-12-04 NOTE — Telephone Encounter (Signed)
  Pt is calling back to f/u, she said her BP is still elevated, this morning,  BP 140/81 and this afternoon BP 153/89

## 2023-12-05 MED ORDER — HYDRALAZINE HCL 50 MG PO TABS: 75.0000 mg | ORAL_TABLET | Freq: Two times a day (BID) | ORAL | 3 refills | Status: DC

## 2023-12-05 NOTE — Telephone Encounter (Signed)
 If taking hydralazine  50mg  bid, can she increase the hydralazine  to 75 mg bid and update us  on bp's early next week. Can she describe more detail when the dizziness happens. Does it happen only when standing, or does it ever happen when sitting or lying down. Does it feel like vertigo? 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 Is the fatigue more of feeling sleepy or more of generalized weakness  Letta Raw MD

## 2023-12-05 NOTE — Telephone Encounter (Signed)
 Spoke to patient who stated that the dizziness does feel like vertigo, but only when standing. Patient stated she does not have problems with dizziness when she is lying down and then stands up. Pt stated when she is already standing and bends to lift something then she feels dizzy. Pt stated that her fatigue feels more like a sleepy fatigue and that she feels like she can fall asleep at any time.   Medication updated and pt will call office Monday/Tuesday with bp readings.

## 2023-12-31 ENCOUNTER — Encounter: Payer: Self-pay | Admitting: Cardiology

## 2024-01-03 NOTE — ED Provider Notes (Signed)
 Emergency Department Provider Note    ED Clinical Impression   Final diagnoses:  Vertigo (Primary)  Dizziness    ED Assessment/Plan    Condition: Stable Disposition: Discharge  This chart has been completed using Dragon Medical Dictation software, and while attempts have been made to ensure accuracy, certain words and phrases may not be transcribed as intended.   History   Chief Complaint  Patient presents with  . Dizziness  . Nausea  . Emesis   HPI  Conception Rose Delgado is a 63 y.o. female  who presents today to the  emergency department complaining of sudden onset dizziness described as a spinning sensation that started this morning.  Patient states she feels like she is spinning static as well.  Associated symptoms include vomiting.  She denies any abdominal pain.  She denies any visual changes.  She denies any headache or focal neurologic complaints.  Describes her symptoms as moderate.  Symptoms worse with movement.    Allergies: is allergic to ciprofloxacin; diflucan [fluconazole]; flagyl  [metronidazole ]; latex, natural rubber; losartan ; metoprolol ; shellfish containing products; and topamax [topiramate]. Medications: has a current medication list which includes the following long-term medication(s): azelastine , diltiazem , diltiazem , epinephrine , famotidine , fluticasone  propionate, hydralazine , and montelukast . PMHx:  has a past medical history of Acid reflux, Chronic back pain, Hypertension, Irregular heart rate, and Migraines. 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 SocHx:  reports that she has never smoked. She has never used smokeless  tobacco. She reports that she does not drink alcohol  and does not use drugs. Allergies, Medications, Medical, Surgical, and Social History were reviewed as documented above.   Social Drivers of Health with Concerns   Alcohol  Use: Not on file  Housing: Not on file  Physical Activity: Insufficiently Active (01/31/2023)   Received from [facility]   Exercise Vital Sign   . On average, how many days per week do you engage in moderate to strenuous exercise (like a brisk walk)?: 3 days   . On average, how many minutes do you engage in exercise at this level?: 30 min  Interpersonal Safety: Not on file  Substance Use: Not on file (05/12/2023)  Internet Connectivity: Not on file     Review Of Systems  Review of Systems  Constitutional:  Negative for fever.  HENT:  Negative for congestion.   Respiratory:  Negative for chest tightness and shortness of breath.   Cardiovascular:  Negative for chest pain.  Gastrointestinal:  Positive for nausea and vomiting. Negative for abdominal pain.  Skin:  Negative for color change.  Neurological:  Positive for dizziness. Negative for headaches.  Psychiatric/Behavioral:  Negative for behavioral problems.   All other systems reviewed and are negative.   Physical Exam   BP 155/92   Pulse 101   Temp 36.4 C (97.5 F) (Temporal)   Resp 18   Ht 160 cm (5' 3)   Wt 64.3 kg (141 lb 12.8 oz)   SpO2 98%   BMI 25.12 kg/m   Physical Exam Vitals and nursing note reviewed.  Constitutional:      General: She is  not in acute distress. HENT:     Head: Normocephalic.     Right Ear: Tympanic membrane normal.     Left Ear: Tympanic membrane normal.   Eyes:     Conjunctiva/sclera: Conjunctivae normal.    Cardiovascular:     Rate and Rhythm: Regular rhythm.     Pulses: Normal pulses.     Heart sounds: Normal heart sounds.  Pulmonary:     Effort: No respiratory distress.     Breath sounds: Normal breath sounds.  Abdominal:     General: There is no  distension.     Tenderness: There is no abdominal tenderness. There is no guarding or rebound.     Hernia: No hernia is present.   Musculoskeletal:        General: No deformity.   Skin:    General: Skin is warm.     Capillary Refill: Capillary refill takes 2 to 3 seconds.     Comments: Normal cap refill.   Neurological:     General: No focal deficit present.     Mental Status: She is oriented to person, place, and time.     Sensory: No sensory deficit.     Motor: No weakness.     Comments: Left reproducible vertigo and fatigable presently nystagmus.  There is no motor, sensory or cerebellar deficits.  Nonfocal neurologic exam.  GCS is 15.  NIH stroke score is 0.  Psychiatric:        Mood and Affect: Mood normal.     ED Course  Medical Decision Making Clinical picture is consistent with peripheral vertigo.  Differential diagnose includes dehydration versus electrolyte abnormality versus anemia.  No clinical suspicion for CVA.  Will treat patient symptomatically.  8:16 AM Patient feels better but still feels dizzy.  Will give Ativan.  Labs unremarkable.  9:30 AM Patient reevaluated.  She feels much better.  9:41 AM Patient is doing much better.  Symptoms almost completely resolved.  Labs reviewed and discussed.  Patient is stable for discharge.  I have reviewed my clinical findings and studies and my clinical impression with the patient. The patient has expressed understanding that at this time there is no evidence for a more malignant underlying process, but the patient also understands that early in the process of a condition such as this, an initial workup can be falsely reassuring. I have counseled the patient and discussed follow-up with the patient, stressing the importance of appropriate follow-up. I have also counseled the patient to return if worse or any concerns. Routine discharge counseling was given to the patient and the patient understands that worsening, changing or  persistent symptoms should prompt an immediate call or follow up with their primary physician or return to the emergency department for reevaluation. Patient has expressed understanding.     Problems Addressed: Dizziness: acute illness or injury that poses a threat to life or bodily functions Vertigo: acute illness or injury  Amount and/or Complexity of Data Reviewed Labs: ordered. Decision-making details documented in ED Course.  Risk Prescription drug management. Decision regarding hospitalization.     Procedures   No results found for this visit on 01/03/24 (from the past 4464 hours).   ED Results Results for orders placed or performed during the hospital encounter of 01/03/24  Comprehensive Metabolic Panel  Result Value Ref Range   Sodium 142 135 - 145 mmol/L   Potassium 3.6 3.5 - 5.0 mmol/L   Chloride 105 98 - 107 mmol/L   CO2 28.5 21.0 -  32.0 mmol/L   Anion Gap 9 3 - 11 mmol/L   BUN 7 (L) 8 - 20 mg/dL   Creatinine 9.29 9.39 - 1.10 mg/dL   BUN/Creatinine Ratio 10    eGFR CKD-EPI (2021) Female >90 >=60 mL/min/1.60m2   Glucose 133 70 - 179 mg/dL   Calcium 89.7 (H) 8.5 - 10.1 mg/dL   Albumin 4.4 3.5 - 5.0 g/dL   Total Protein 7.9 6.0 - 8.0 g/dL   Total Bilirubin 0.4 0.3 - 1.2 mg/dL   AST 15 15 - 40 U/L   ALT 35 12 - 78 U/L   Alkaline Phosphatase 138 (H) 46 - 116 U/L  Magnesium   Result Value Ref Range   Magnesium  2.1 1.6 - 2.6 mg/dL  CBC w/ Differential  Result Value Ref Range   WBC 9.3 4.0 - 10.5 10*9/L   RBC 4.62 3.80 - 5.10 10*12/L   HGB 14.2 11.5 - 15.0 g/dL   HCT 58.0 65.9 - 55.9 %   MCV 90.7 80.0 - 98.0 fL   MCH 30.7 27.0 - 34.0 pg   MCHC 33.9 32.0 - 36.0 g/dL   RDW 87.7 88.4 - 85.4 %   MPV 10.1 7.4 - 10.4 fL   Platelet 295 140 - 415 10*9/L   Neutrophils % 76.4 %   Lymphocytes % 16.3 %   Monocytes % 6.9 %   Eosinophils % 0.1 %   Basophils % 0.1 %   Absolute Neutrophils 7.1 1.8 - 7.8 10*9/L   Absolute Lymphocytes 1.5 0.7 - 4.5 10*9/L   Absolute  Monocytes 0.6 0.1 - 1.0 10*9/L   Absolute Eosinophils 0.0 0.0 - 0.4 10*9/L   Absolute Basophils 0.0 0.0 - 0.2 10*9/L  Urinalysis with Microscopy with Culture Reflex  Result Value Ref Range   Color, UA Light Yellow    Clarity, UA Clear Clear   Specific Gravity, UA 1.009 (L) 1.010 - 1.025   pH, UA 7.0 5.0 - 8.0   Leukocyte Esterase, UA Negative Negative   Nitrite, UA Negative Negative   Protein, UA Negative Negative   Glucose, UA Negative Negative, Trace   Ketones, UA Negative Negative   Urobilinogen, UA <2.0 mg/dL <7.9 mg/dL   Bilirubin, UA Negative Negative   Blood, UA Negative Negative   RBC, UA 1 0 - 3 /HPF   WBC, UA 3 0 - 3 /HPF   Squam Epithel, UA 4 0 - 10 /HPF   Bacteria, UA None Seen None Seen /HPF   WBC Clumps None Seen None Seen /HPF   Hyphal Yeast None Seen None Seen /HPF   Yeast, UA None Seen None Seen /HPF   No results found.  Medications Administered:  Medications  sodium chloride  0.9% (NS) bolus 1,000 mL (0 mL Intravenous Stopped 01/03/24 1004)  ondansetron  (ZOFRAN ) injection 4 mg (4 mg Intravenous Given 01/03/24 0754)  meclizine (ANTIVERT) tablet 50 mg (50 mg Oral Given 01/03/24 0829)  LORazepam (ATIVAN) injection 1 mg (1 mg Intravenous Given 01/03/24 0840)    Discharge Medications (Medications Prescribed during this  ED visit and Patient's Home Medications) :    Your Medication List     START taking these medications    meclizine 25 mg tablet Commonly known as: ANTIVERT Take 1 tablet (25 mg total) by mouth Three (3) times a day as needed for dizziness.       CHANGE how you take these medications    ondansetron  4 MG disintegrating tablet Commonly known as: ZOFRAN -ODT Take 4 mg by mouth every  eight (8) hours as needed for nausea. What changed: Another medication with the same name was added. Make sure you understand how and when to take each.   ondansetron  4 MG disintegrating tablet Commonly known as: ZOFRAN -ODT Take 1 tablet (4 mg total) by mouth every  eight (8) hours as needed for nausea for up to 7 days. What changed: You were already taking a medication with the same name, and this prescription was added. Make sure you understand how and when to take each.       ASK your doctor about these medications    azelastine  137 mcg (0.1 %) nasal spray Commonly known as: ASTELIN  1 spray into each nostril Two (2) times a day. Use in each nostril as directed   cetirizine  10 MG tablet Commonly known as: ZYRTEC  Take 10 mg by mouth daily.   cyclobenzaprine 10 MG tablet Commonly known as: FLEXERIL Take 10 mg by mouth Three (3) times a day.   dilTIAZem  300 MG 24 hr capsule Commonly known as: CARDIZEM  CD Take 300 mg by mouth daily.   dilTIAZem  30 MG tablet Commonly known as: CARDIZEM  Take 30 mg by mouth two (2) times a day as needed.   EPINEPHrine  0.15 mg/0.3 mL injection Commonly known as: EPIPEN  JR Inject 0.15 mg into the muscle as needed for anaphylaxis.   famotidine  20 MG tablet Commonly known as: PEPCID  Take 20 mg by mouth Two (2) times a day.   fluticasone  propionate 50 mcg/actuation nasal spray Commonly known as: FLONASE  1 spray into each nostril daily.   hydrALAZINE  25 MG tablet Commonly known as: APRESOLINE  Take 25 mg by mouth two (2) times a day.   magnesium  (amino acid chelate) 133 mg tablet Generic drug: magnesium  oxide-Mg AA chelate Take 2 tablets by mouth daily. 250 MG TABLETS   methocarbamol  500 MG tablet Commonly known as: ROBAXIN  Take 1,000 mg by mouth Three (3) times a day.   montelukast  10 mg tablet Commonly known as: SINGULAIR  Take 10 mg by mouth nightly.   pantoprazole  40 MG tablet Commonly known as: Protonix  Take 40 mg by mouth daily.   potassium chloride  20 MEQ ER tablet Take 20 mEq by mouth daily.   pyridoxine (vitamin B6) 100 MG tablet Commonly known as: B-6 Take 100 mg by mouth daily.   rizatriptan 10 MG disintegrating tablet Commonly known as: MAXALT-MLT Take 10 mg by mouth as needed  for migraine. May repeat in 2 hours if needed   vitamin E-45 MG (100 UNITS) 45 MG (100 UNITS) Cap capsule Take by mouth daily.          Cherie Ardeen Hanger, MD 01/03/24 1010

## 2024-01-20 ENCOUNTER — Ambulatory Visit: Payer: Self-pay | Admitting: Cardiology

## 2024-01-21 ENCOUNTER — Ambulatory Visit: Payer: Self-pay | Admitting: Cardiology

## 2024-01-21 ENCOUNTER — Encounter: Payer: Self-pay | Admitting: *Deleted

## 2024-01-29 ENCOUNTER — Telehealth: Payer: Self-pay | Admitting: *Deleted

## 2024-01-29 MED ORDER — HYDRALAZINE HCL 100 MG PO TABS: 100.0000 mg | ORAL_TABLET | Freq: Two times a day (BID) | ORAL | 6 refills | Status: DC

## 2024-01-29 NOTE — Telephone Encounter (Signed)
 Spoke with patient - states that she now does not feel like the dizziness was coming from her BP or medications.  Thinks she may have had food poisoning as she had n/v - diarrhea.  She is now better from that.  Also says she has recently been dealing with eye issues - just changed prescription & had som eye medications as well.    States her dizziness is is better now & her BP running from 126-150/70-80's w/ HR 62-78.    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

## 2024-01-29 NOTE — Telephone Encounter (Signed)
 Verify taking hydralazine  75mg  bid, if so increase to 100mg  bid and update us  1 week on bp's  JINNY Ross MD

## 2024-01-29 NOTE — Telephone Encounter (Signed)
 This message is to inform you that the patient has not yet read the following message. (Notification date: January 28, 2024)    BP log  From Jon KANDICE Potters, LPN To Rose Delgado, Rose Delgado and Delivered 01/21/2024 11:09 AM      BP's somewhat elevated at times, we could adjust the medications but would depend on how her dizziness has been doing recently. Sometimes going up on the bp meds can make that worst. 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 From her chart looks like more vertigo issues 01/03/24 with ER visit. Would be more focused on dizziness that would mainly occurs with standing only as far as the bp meds. Can she update us  on the dizziness from that standpoint.    JINNY Ross MD AMB BP MONITORING       [address]  MyChart User Last Read On  DEBRAH GRANDERSON Not Read

## 2024-01-29 NOTE — Telephone Encounter (Signed)
 Patient notified and verbalized understanding.

## 2024-02-06 ENCOUNTER — Telehealth: Payer: Self-pay | Admitting: Cardiology

## 2024-02-06 NOTE — Telephone Encounter (Signed)
I will forward to provider for review °

## 2024-02-06 NOTE — Telephone Encounter (Signed)
 Easiest change would be if she is able to take the hydralazine  3 times a day, could try 75mg  tid initially. Would be hesistant to increase her diltiazem  as her heart rates are fine but are in the low normal range and this could make them lower. If could take hydralazine  tid could help prevent having to start a whole new medication. She is an ER nurse and very knowledgeable.   Rose Ross MD

## 2024-02-06 NOTE — Telephone Encounter (Signed)
 Sent MyChart message to patient,await her response.

## 2024-02-06 NOTE — Telephone Encounter (Addendum)
 Pt came into [place] about blood pressure.  Systolic has been 126-120 Diastolic has been 69-96  Yesterday she had a migraine and had to take an extra hydralazine (25mg ) as well as her normal 100mg  2 times daily. She is wondering if Dr.Branch needs to call in another prescription for a low dose medication to help!   Patient will Mail her BP Log to us .    Best number: [phone]

## 2024-02-07 ENCOUNTER — Encounter: Payer: Self-pay | Admitting: Cardiology

## 2024-02-07 MED ORDER — HYDRALAZINE HCL 25 MG PO TABS: 75.0000 mg | ORAL_TABLET | Freq: Three times a day (TID) | ORAL | 3 refills | Status: DC

## 2024-02-07 NOTE — Telephone Encounter (Signed)
 Pt brought in BP Log. Gave corrected diastolic number range. Scan log into her chart and routed to Dr.Branch. Rose Delgado is asking for a call as she can not access my chart messages.

## 2024-02-26 NOTE — Telephone Encounter (Signed)
 DASH diet and aggressive low sodium < 1500mg  per day can bring pressure down about 10 points. No real supplements that would be beneficial. Did she increase the hydralazine  to 100mg  tid, chart still indicating 75mg  tid and a phone note had mentioned the increase. Could pass on some dietary info to her and give some time if not in favorable of med change right now  Rose Ross MD

## 2024-02-27 ENCOUNTER — Encounter: Payer: Self-pay | Admitting: Obstetrics & Gynecology

## 2024-02-27 ENCOUNTER — Other Ambulatory Visit (HOSPITAL_COMMUNITY)
Admission: RE | Admit: 2024-02-27 | Discharge: 2024-02-27 | Disposition: A | Discharge status: Home / Self Care | Source: Ambulatory Visit | Attending: Obstetrics & Gynecology | Admitting: Obstetrics & Gynecology

## 2024-02-27 ENCOUNTER — Telehealth: Payer: Self-pay

## 2024-02-27 ENCOUNTER — Ambulatory Visit: Admitting: Obstetrics & Gynecology

## 2024-02-27 VITALS — BP 143/80 | HR 68 | Ht 63.0 in | Wt 143.0 lb

## 2024-02-27 DIAGNOSIS — Z01419 Encounter for gynecological examination (general) (routine) without abnormal findings: Secondary | ICD-10-CM | POA: Diagnosis present

## 2024-02-27 MED ORDER — HYDRALAZINE HCL 100 MG PO TABS: 100.0000 mg | ORAL_TABLET | Freq: Three times a day (TID) | ORAL | 3 refills | Status: AC

## 2024-02-27 NOTE — Progress Notes (Signed)
 Subjective:     Rose Delgado is a 63 y.o. female here for a routine exam.  Patient's last menstrual period was 07/29/2017. G0P0 Birth Control Method:  menopausal Menstrual Calendar(currently): amenorrhea  Current complaints: none.   Current acute medical issues:  none   Recent Gynecologic History Patient's last menstrual period was 07/29/2017. Last Pap: 01/2023,  normal Last mammogram: 12/2023-->[facility], normal,    Past Medical History:  Diagnosis Date   Allergic rhinitis    Angio-edema    Fibroids    uterine   GERD (gastroesophageal reflux disease)    Hypertension    Irregular heart beat    Migraines    Nausea with vomiting 07/14/2014   Perimenopause 03/09/2014   Postmenopausal 2016   Urticaria     Past Surgical History:  Procedure Laterality Date   ANTERIOR CRUCIATE LIGAMENT REPAIR Left 07/18/2021   Procedure: LEFT KNEE ANTERIOR CRUCIATE LIGAMENT RECONSTRUCTION, HAMSTRING AUTOGRAFT, MENISCAL DEBRIDEMENT;  Surgeon: Addie Cordella Hamilton, MD;  Location: [facility] OR;  Service: Orthopedics;  Laterality: Left;   BREAST BIOPSY Left 2008   benign   COLONOSCOPY  06/2011   Dr. Donnel: per patient it was normal, follow up in 10 years.    ESOPHAGOGASTRODUODENOSCOPY     Dr. Jeaneen: late 1990s/early 2000   ESOPHAGOGASTRODUODENOSCOPY N/A 05/19/2014   SLF:NO OBVIOUS SOURCE FOR DYSGEUSIA IDENTIFED/MILD Non-erosive gastritis   INCISION AND DRAINAGE Right 07/31/2017   Procedure: INCISION AND DRAINAGE RIGHT THUMB NAIL BED;  Surgeon: Sissy Cough, MD;  Location: [facility];  Service: Orthopedics;  Laterality: Right;   INGUINAL HERNIA REPAIR     KNEE SURGERY Right    NAILBED REPAIR Right 07/31/2017   Procedure: NAILBED BIOPSY;  Surgeon: Sissy Cough, MD;  Location: [facility];  Service: Orthopedics;  Laterality: Right;   TONSILLECTOMY AND ADENOIDECTOMY      OB History     Gravida  0   Para      Term      Preterm      AB      Living          SAB      IAB      Ectopic      Multiple      Live Births              Social History   Socioeconomic History   Marital status: Single    Spouse name: Not on file   Number of children: Not on file   Years of education: Not on file   Highest education level: Not on file  Occupational History   Occupation: Charity fundraiser at [employer]    Employer: [employer]  Tobacco Use   Smoking status: Never   Smokeless tobacco: Never  Vaping Use   Vaping status: Never Used  Substance and Sexual Activity   Alcohol  use: No   Drug use: No   Sexual activity: Yes    Birth control/protection: Post-menopausal, None  Other Topics Concern   Not on file  Social History Narrative   Not on file   Social Drivers of Health   Financial Resource Strain: Low Risk  (01/31/2023)   Overall Financial Resource Strain (CARDIA)    Difficulty of Paying Living Expenses: Not hard at all  Food Insecurity: No Food Insecurity (01/31/2023)   Hunger Vital Sign    Worried About Running Out of Food in the Last Year: Never true    Ran Out of Food in the Last  Year: Never true  Transportation Needs: No Transportation Needs (01/31/2023)   PRAPARE - Administrator, Civil Service (Medical): No    Lack of Transportation (Non-Medical): No  Physical Activity: Insufficiently Active (01/31/2023)   Exercise Vital Sign    Days of Exercise per Week: 3 days    Minutes of Exercise per Session: 30 min  Stress: No Stress Concern Present (01/31/2023)   [employer] of Occupational Health - Occupational Stress Questionnaire    Feeling of Stress : Not at all  Social Connections: Moderately Integrated (01/31/2023)   Social Connection and Isolation Panel    Frequency of Communication with Friends and Family: More than three times a week    Frequency of Social Gatherings with Friends and Family: Twice a week    Attends Religious Services: More than 4 times per year    Active Member of [employer] or Organizations: Yes     Attends Engineer, structural: More than 4 times per year    Marital Status: Divorced    Family History  Problem Relation Age of Onset   Other Father        aneursym   Other Mother        glaucoma; pacemaker   Hypertension Mother    Cancer Brother        lung   Hypertension Brother    Sleep apnea Brother    Hypertension Brother    Hypertension Brother    Cancer Sister        biliary, deceased age 50   Hypertension Sister    Hypertension Sister    Hypertension Sister    Hypertension Sister    Colon cancer Neg Hx    Allergic rhinitis Neg Hx    Asthma Neg Hx      Current Outpatient Medications:    Biotin 5 MG CAPS, Take 5 mg by mouth daily., Disp: , Rfl:    celecoxib  (CELEBREX ) 100 MG capsule, TAKE 1 CAPSULE BY MOUTH DAILY AS NEEDED, Disp: 30 capsule, Rfl: 0   cetirizine  (ZYRTEC ) 10 MG tablet, Take 1 tablet (10 mg total) by mouth daily., Disp: 90 tablet, Rfl: 1   diltiazem  (CARDIZEM  CD) 240 MG 24 hr capsule, Take 1 capsule (240 mg total) by mouth daily., Disp: 90 capsule, Rfl: 2   diltiazem  (CARDIZEM ) 30 MG tablet, Take 1 tablet by mouth 2 times daily as needed (for breakthrough palpitations)., Disp: 60 tablet, Rfl: 3   EPINEPHrine  0.3 mg/0.3 mL IJ SOAJ injection, Use as directed for severe allergic reaction., Disp: 2 each, Rfl: 2   famotidine  (PEPCID ) 20 MG tablet, Take 1 tablet (20 mg total) by mouth 2 (two) times daily., Disp: 90 tablet, Rfl: 2   fluticasone  (FLONASE ) 50 MCG/ACT nasal spray, Place 2 sprays into both nostrils daily., Disp: 48 g, Rfl: 3   hydrALAZINE  (APRESOLINE ) 25 MG tablet, Take 3 tablets (75 mg total) by mouth 3 (three) times daily., Disp: 270 tablet, Rfl: 3   Magnesium  250 MG TABS, Take 250 mg by mouth daily., Disp: , Rfl:    methocarbamol  (ROBAXIN ) 500 MG tablet, Take 1 tablet (500 mg total) by mouth every 8 (eight) hours as needed for muscle spasms., Disp: 30 tablet, Rfl: 0   montelukast  (SINGULAIR ) 10 MG tablet, Take 1 tablet (10 mg total) by  mouth daily., Disp: 90 tablet, Rfl: 3   Multiple Vitamins-Minerals (SM COMPLETE 50+) TABS, Take 1 tablet by mouth daily., Disp: , Rfl:    ondansetron  (ZOFRAN ) 4 MG tablet,  Take 1 tablet (4 mg total) by mouth every 4 (four) hours as needed for nausea or vomiting., Disp: 30 tablet, Rfl: 2   pantoprazole  (PROTONIX ) 40 MG tablet, Take 1 tablet (40 mg total) by mouth daily for acid reflux, Disp: 90 tablet, Rfl: 3   polyethylene glycol powder (GLYCOLAX /MIRALAX ) 17 GM/SCOOP powder, DISSOLVE 17 GRAMS (1 CAPFUL) IN LIQUID AND DRINK BY MOUTH TWICE DAILY FOR CONSTIPATION, Disp: 952 g, Rfl: 3   Potassium Chloride  ER 20 MEQ TBCR, Take 1 tablet (20 mEq) by mouth daily., Disp: 90 tablet, Rfl: 2   rizatriptan (MAXALT-MLT) 10 MG disintegrating tablet, Take 10 mg by mouth as needed for migraine. May repeat in 2 hours if needed, Disp: , Rfl:    Azelastine  HCl 137 MCG/SPRAY SOLN, Place 2 sprays into the nose 2 (two) times daily as needed., Disp: 90 mL, Rfl: 3   cyclobenzaprine (FLEXERIL) 10 MG tablet, Take 1 tablet by mouth every 8 (eight) hours as needed. (Patient not taking: Reported on 02/27/2024), Disp: , Rfl: 0  Review of Systems  Review of Systems  Constitutional: Negative for fever, chills, weight loss, malaise/fatigue and diaphoresis.  HENT: Negative for hearing loss, ear pain, nosebleeds, congestion, sore throat, neck pain, tinnitus and ear discharge.   Eyes: Negative for blurred vision, double vision, photophobia, pain, discharge and redness.  Respiratory: Negative for cough, hemoptysis, sputum production, shortness of breath, wheezing and stridor.   Cardiovascular: Negative for chest pain, palpitations, orthopnea, claudication, leg swelling and PND.  Gastrointestinal: negative for abdominal pain. Negative for heartburn, nausea, vomiting, diarrhea, constipation, blood in stool and melena.  Genitourinary: Negative for dysuria, urgency, frequency, hematuria and flank pain.  Musculoskeletal: Negative for  myalgias, back pain, joint pain and falls.  Skin: Negative for itching and rash.  Neurological: Negative for dizziness, tingling, tremors, sensory change, speech change, focal weakness, seizures, loss of consciousness, weakness and headaches.  Endo/Heme/Allergies: Negative for environmental allergies and polydipsia. Does not bruise/bleed easily.  Psychiatric/Behavioral: Negative for depression, suicidal ideas, hallucinations, memory loss and substance abuse. The patient is not nervous/anxious and does not have insomnia.        Objective:  Blood pressure (!) 143/80, pulse 68, height 5' 3 (1.6 m), weight 143 lb (64.9 kg), last menstrual period 07/29/2017.   Physical Exam  Vitals reviewed. Constitutional: She is oriented to person, place, and time. She appears well-developed and well-nourished.  HENT:  Head: Normocephalic and atraumatic.        Right Ear: External ear normal.  Left Ear: External ear normal.  Nose: Nose normal.  Mouth/Throat: Oropharynx is clear and moist.  Eyes: Conjunctivae and EOM are normal. Pupils are equal, round, and reactive to light. Right eye exhibits no discharge. Left eye exhibits no discharge. No scleral icterus.  Neck: Normal range of motion. Neck supple. No tracheal deviation present. No thyromegaly present.  Cardiovascular: Normal rate, regular rhythm, normal heart sounds and intact distal pulses.  Exam reveals no gallop and no friction rub.   No murmur heard. Respiratory: Effort normal and breath sounds normal. No respiratory distress. She has no wheezes. She has no rales. She exhibits no tenderness.  GI: Soft. Bowel sounds are normal. She exhibits no distension and no mass. There is no tenderness. There is no rebound and no guarding.  Genitourinary:  Breasts no masses skin changes or nipple changes bilaterally      Vulva is normal without lesions Vagina is pink moist without discharge Cervix normal in appearance and pap is done Uterus is  normal size shape  and contour Adnexa is negative with normal sized ovaries   Musculoskeletal: Normal range of motion. She exhibits no edema and no tenderness.  Neurological: She is alert and oriented to person, place, and time. She has normal reflexes. She displays normal reflexes. No cranial nerve deficit. She exhibits normal muscle tone. Coordination normal.  Skin: Skin is warm and dry. No rash noted. No erythema. No pallor.  Psychiatric: She has a normal mood and affect. Her behavior is normal. Judgment and thought content normal.       Medications Ordered at today's visit: No orders of the defined types were placed in this encounter.   Other orders placed at today's visit: No orders of the defined types were placed in this encounter.    ASSESSMENT + PLAN:    ICD-10-CM   1. Well woman exam with routine gynecological exam  Z01.419     2. Encounter for gynecological examination with Papanicolaou smear of cervix  Z01.419 Cytology - PAP( [place])          Return in about 1 year (around 02/26/2025), or if symptoms worsen or fail to improve.

## 2024-02-27 NOTE — Telephone Encounter (Signed)
 Sorry, I had seen note considering possibly 100mg  tid but perhaps we had not committed to that change. I would change hydralazine  to 100mg  tid, aggressive dietary changes as previously mentioned regarding the DASH diet and very low sodium diet.    JINNY Ross MD    E-scribed Hydralazine  100 mg tid to [facility], I will send patient MyChart message.

## 2024-02-28 LAB — CYTOLOGY - PAP
Comment: NEGATIVE
Diagnosis: NEGATIVE
High risk HPV: NEGATIVE

## 2024-03-25 ENCOUNTER — Encounter: Payer: Self-pay | Admitting: Cardiology

## 2024-03-26 ENCOUNTER — Ambulatory Visit: Payer: Self-pay | Admitting: Cardiology

## 2024-04-07 ENCOUNTER — Ambulatory Visit: Payer: Self-pay | Admitting: Cardiology

## 2024-04-08 ENCOUNTER — Other Ambulatory Visit: Payer: Self-pay

## 2024-04-08 MED ORDER — POTASSIUM CHLORIDE ER 20 MEQ PO TBCR: 20.0000 meq | EXTENDED_RELEASE_TABLET | Freq: Every day | ORAL | 2 refills | Status: AC

## 2024-05-04 ENCOUNTER — Encounter: Payer: Self-pay | Admitting: Radiology

## 2024-07-22 ENCOUNTER — Encounter (HOSPITAL_COMMUNITY): Payer: Self-pay | Admitting: Emergency Medicine

## 2024-07-22 ENCOUNTER — Emergency Department (HOSPITAL_COMMUNITY)
Admission: EM | Admit: 2024-07-22 | Discharge: 2024-07-22 | Disposition: A | Discharge status: Home / Self Care | Attending: Emergency Medicine | Admitting: Emergency Medicine

## 2024-07-22 ENCOUNTER — Emergency Department (HOSPITAL_COMMUNITY)

## 2024-07-22 ENCOUNTER — Other Ambulatory Visit: Payer: Self-pay

## 2024-07-22 DIAGNOSIS — R112 Nausea with vomiting, unspecified: Secondary | ICD-10-CM | POA: Diagnosis present

## 2024-07-22 DIAGNOSIS — D72829 Elevated white blood cell count, unspecified: Secondary | ICD-10-CM | POA: Diagnosis not present

## 2024-07-22 DIAGNOSIS — Z91040 Latex allergy status: Secondary | ICD-10-CM | POA: Insufficient documentation

## 2024-07-22 DIAGNOSIS — K529 Noninfective gastroenteritis and colitis, unspecified: Secondary | ICD-10-CM | POA: Diagnosis not present

## 2024-07-22 LAB — CBC WITH DIFFERENTIAL/PLATELET
Abs Immature Granulocytes: 0.03 K/uL (ref 0.00–0.07)
Basophils Absolute: 0 K/uL (ref 0.0–0.1)
Basophils Relative: 0 %
Eosinophils Absolute: 0 K/uL (ref 0.0–0.5)
Eosinophils Relative: 0 %
HCT: 43.5 % (ref 36.0–46.0)
Hemoglobin: 14.6 g/dL (ref 12.0–15.0)
Immature Granulocytes: 0 %
Lymphocytes Relative: 3 %
Lymphs Abs: 0.3 K/uL — ABNORMAL LOW (ref 0.7–4.0)
MCH: 31.5 pg (ref 26.0–34.0)
MCHC: 33.6 g/dL (ref 30.0–36.0)
MCV: 94 fL (ref 80.0–100.0)
Monocytes Absolute: 0.4 K/uL (ref 0.1–1.0)
Monocytes Relative: 4 %
Neutro Abs: 10.4 K/uL — ABNORMAL HIGH (ref 1.7–7.7)
Neutrophils Relative %: 93 %
Platelets: 234 K/uL (ref 150–400)
RBC: 4.63 MIL/uL (ref 3.87–5.11)
RDW: 12.5 % (ref 11.5–15.5)
WBC: 11.2 K/uL — ABNORMAL HIGH (ref 4.0–10.5)
nRBC: 0 % (ref 0.0–0.2)

## 2024-07-22 LAB — COMPREHENSIVE METABOLIC PANEL WITH GFR
ALT: 27 U/L (ref 0–44)
AST: 21 U/L (ref 15–41)
Albumin: 4.7 g/dL (ref 3.5–5.0)
Alkaline Phosphatase: 124 U/L (ref 38–126)
Anion gap: 14 (ref 5–15)
BUN: 9 mg/dL (ref 8–23)
CO2: 20 mmol/L — ABNORMAL LOW (ref 22–32)
Calcium: 10 mg/dL (ref 8.9–10.3)
Chloride: 106 mmol/L (ref 98–111)
Creatinine, Ser: 0.71 mg/dL (ref 0.44–1.00)
GFR, Estimated: >60 mL/min
Glucose, Bld: 116 mg/dL — ABNORMAL HIGH (ref 70–99)
Potassium: 3.7 mmol/L (ref 3.5–5.1)
Sodium: 140 mmol/L (ref 135–145)
Total Bilirubin: 0.5 mg/dL (ref 0.0–1.2)
Total Protein: 7.6 g/dL (ref 6.5–8.1)

## 2024-07-22 LAB — LIPASE, BLOOD: Lipase: 21 U/L (ref 11–51)

## 2024-07-22 MED ORDER — ONDANSETRON HCL 4 MG/2ML IJ SOLN
4.0000 mg | Freq: Once | INTRAMUSCULAR | Status: AC
  Administered 2024-07-22: 4 mg via INTRAVENOUS
  Filled 2024-07-22: qty 2

## 2024-07-22 MED ORDER — RIFAXIMIN 550 MG PO TABS: 550.0000 mg | ORAL_TABLET | Freq: Three times a day (TID) | ORAL | 0 refills | Status: AC

## 2024-07-22 MED ORDER — IOHEXOL 300 MG/ML  SOLN
100.0000 mL | Freq: Once | INTRAMUSCULAR | Status: AC | PRN
  Administered 2024-07-22: 100 mL via INTRAVENOUS

## 2024-07-22 MED ORDER — SUCRALFATE 1 G PO TABS: 1.0000 g | ORAL_TABLET | Freq: Three times a day (TID) | ORAL | 0 refills | Status: AC

## 2024-07-22 MED ORDER — LACTATED RINGERS IV BOLUS
1000.0000 mL | Freq: Once | INTRAVENOUS | Status: AC
  Administered 2024-07-22: 1000 mL via INTRAVENOUS

## 2024-07-22 NOTE — ED Provider Notes (Signed)
 " [place] EMERGENCY DEPARTMENT AT [facility] Provider Note   CSN: [id number] Arrival date & time: 07/22/24  9162     Patient presents with: Abdominal Pain and Emesis   Rose Delgado is a 64 y.o. female.   64 year old female history of delayed gastric emptying and reflux who presents to the emergency room with nausea, vomiting, and diarrhea.  Patient reports that she is been sick for the past week.  Has had nausea vomiting and diarrhea.  4 episodes today of nonbloody nonbilious emesis along with 4 loose stools.  Also has been having upper abdominal pain that is difficult to characterize.  Constant.  Nonradiating.  Thinks she had an abdominal surgery as a child but does not recall what it was.  Sister recently diagnosed with colon cancer and her brother had a colectomy but unsure exactly what the formal diagnosis was that led to this.  In 2016 she had a gastric emptying study that showed mild delay in gastric emptying.  Says that she had an episode similar to this in the summer.  No marijuana use or heavy alcohol  use.  Was seen yesterday at [facility] and given IV fluids and discharged but reports that her symptoms have persisted       Prior to Admission medications  Medication Sig Start Date End Date Taking? Authorizing Provider  rifaximin  (XIFAXAN ) 550 MG TABS tablet Take 1 tablet (550 mg total) by mouth 3 (three) times daily for 10 days. 07/22/24 08/01/24 Yes Yolande Lamar BROCKS, MD  sucralfate  (CARAFATE ) 1 g tablet Take 1 tablet (1 g total) by mouth 4 (four) times daily -  with meals and at bedtime. Take 30 mins after the pepcid . 07/22/24  Yes Yolande Lamar BROCKS, MD  Azelastine  HCl 137 MCG/SPRAY SOLN Place 2 sprays into the nose 2 (two) times daily as needed. 10/30/21 11/20/88  Cari Arlean HERO, FNP  Biotin 5 MG CAPS Take 5 mg by mouth daily.    [provider]  celecoxib  (CELEBREX ) 100 MG capsule TAKE 1 CAPSULE BY MOUTH DAILY AS NEEDED 11/18/23   Magnant, Carlin CROME,  PA-C  cetirizine  (ZYRTEC ) 10 MG tablet Take 1 tablet (10 mg total) by mouth daily. 10/01/22     cyclobenzaprine (FLEXERIL) 10 MG tablet Take 1 tablet by mouth every 8 (eight) hours as needed. Patient not taking: Reported on 02/27/2024 12/25/16   [provider]  diltiazem  (CARDIZEM  CD) 240 MG 24 hr capsule Take 1 capsule (240 mg total) by mouth daily. 11/26/23   Alvan Dorn FALCON, MD  diltiazem  (CARDIZEM ) 30 MG tablet Take 1 tablet by mouth 2 times daily as needed (for breakthrough palpitations). 02/16/22   Alvan Dorn FALCON, MD  EPINEPHrine  0.3 mg/0.3 mL IJ SOAJ injection Use as directed for severe allergic reaction. 10/30/21   Cari Arlean HERO, FNP  famotidine  (PEPCID ) 20 MG tablet Take 1 tablet (20 mg total) by mouth 2 (two) times daily. 01/30/22   Iva Marty Saltness, MD  fluticasone  (FLONASE ) 50 MCG/ACT nasal spray Place 2 sprays into both nostrils daily. 12/24/22     hydrALAZINE  (APRESOLINE ) 100 MG tablet Take 1 tablet (100 mg total) by mouth 3 (three) times daily. 02/27/24   Alvan Dorn FALCON, MD  Magnesium  250 MG TABS Take 250 mg by mouth daily.    [provider]  methocarbamol  (ROBAXIN ) 500 MG tablet Take 1 tablet (500 mg total) by mouth every 8 (eight) hours as needed for muscle spasms. 12/27/22   Addie Cordella Hamilton, MD  montelukast  (SINGULAIR ) 10 MG tablet Take 1 tablet (10 mg total) by mouth daily. 10/01/22     Multiple Vitamins-Minerals (SM COMPLETE 50+) TABS Take 1 tablet by mouth daily. 06/30/18   [provider]  ondansetron  (ZOFRAN ) 4 MG tablet Take 1 tablet (4 mg total) by mouth every 4 (four) hours as needed for nausea or vomiting. 07/14/14   Fields, Margo CROME, MD  pantoprazole  (PROTONIX ) 40 MG tablet Take 1 tablet (40 mg total) by mouth daily for acid reflux 05/22/23   Alvan Dorn FALCON, MD  polyethylene glycol powder (GLYCOLAX /MIRALAX ) 17 GM/SCOOP powder DISSOLVE 17 GRAMS (1 CAPFUL) IN LIQUID AND DRINK BY MOUTH TWICE DAILY FOR CONSTIPATION 11/23/22     Potassium  Chloride ER 20 MEQ TBCR Take 1 tablet (20 mEq) by mouth daily. 04/08/24   Alvan Dorn FALCON, MD  rizatriptan (MAXALT-MLT) 10 MG disintegrating tablet Take 10 mg by mouth as needed for migraine. May repeat in 2 hours if needed    [provider]    Allergies: Metoprolol , Shellfish allergy , Flagyl  [metronidazole ], Gabapentin  (once-daily), Lactose intolerance (gi), Losartan , Topamax [topiramate], Ciprofloxacin hcl, Diflucan [fluconazole], and Latex    Review of Systems  Updated Vital Signs BP (!) 111/98   Pulse 90   Temp 98.3 F (36.8 C) (Oral)   Resp 18   LMP 07/29/2017   SpO2 96%   Physical Exam Vitals and nursing note reviewed.  Constitutional:      General: She is not in acute distress.    Appearance: She is well-developed.  HENT:     Head: Normocephalic and atraumatic.     Right Ear: External ear normal.     Left Ear: External ear normal.     Nose: Nose normal.  Eyes:     Extraocular Movements: Extraocular movements intact.     Conjunctiva/sclera: Conjunctivae normal.     Pupils: Pupils are equal, round, and reactive to light.  Abdominal:     General: Abdomen is flat. There is no distension.     Palpations: Abdomen is soft. There is no mass.     Tenderness: There is abdominal tenderness. There is no guarding.  Skin:    General: Skin is warm and dry.  Neurological:     Mental Status: She is alert and oriented to person, place, and time. Mental status is at baseline.  Psychiatric:        Mood and Affect: Mood normal.     (all labs ordered are listed, but only abnormal results are displayed) Labs Reviewed  COMPREHENSIVE METABOLIC PANEL WITH GFR - Abnormal; Notable for the following components:      Result Value   CO2 20 (*)    Glucose, Bld 116 (*)    All other components within normal limits  CBC WITH DIFFERENTIAL/PLATELET - Abnormal; Notable for the following components:   WBC 11.2 (*)    Neutro Abs 10.4 (*)    Lymphs Abs 0.3 (*)    All other components  within normal limits  LIPASE, BLOOD    EKG: None  Radiology: CT ABDOMEN PELVIS W CONTRAST Result Date: 07/22/2024 EXAM: CT ABDOMEN AND PELVIS WITH CONTRAST 07/22/2024 10:07:44 AM TECHNIQUE: CT of the abdomen and pelvis was performed with the administration of 100 mL of iohexol  (OMNIPAQUE ) 300 MG/ML solution. Multiplanar reformatted images are provided for review. Automated exposure control, iterative reconstruction, and/or weight-based adjustment of the mA/kV was utilized to reduce the radiation dose to as low as reasonably achievable. COMPARISON: CTA abdomen and pelvis 01/30/2022. CLINICAL  HISTORY: 64 year old female with upper abdominal pain, nausea, vomiting, and diarrhea since yesterday. FINDINGS: LOWER CHEST: No acute abnormality. LIVER: The liver is unremarkable. GALLBLADDER AND BILE DUCTS: Gallbladder is unremarkable. No biliary ductal dilatation. SPLEEN: No acute abnormality. PANCREAS: No acute abnormality. ADRENAL GLANDS: No acute abnormality. KIDNEYS, URETERS AND BLADDER: Difficult to exclude punctate nephrolithiasis such as left kidney coronal image 79. Symmetric and normal renal enhancement and contrast excretion. No hydronephrosis. No perinephric or periureteral stranding. Urinary bladder is unremarkable. Effusion of calcified pelvic phleboliths. GI AND BOWEL: Decompressed stomach and duodenum. Mildly redundant but mostly decompressed large bowel throughout the abdomen and pelvis. Occasional right colon diverticula. Diminutive and normal appendix on series 2 image 53, although possible fecalith near the appendix origin as seen on coronal image 55. No regional inflammation. Indistinct appearance of some large bowel segments, especially the descending colon on coronal image 68 with borderline to mild mesenteric inflammation there. Nondilated small bowel. There is no bowel obstruction. Chronic rectus muscle diastasis with no ventral abdominal hernia. PERITONEUM AND RETROPERITONEUM: No ascites. No  free air. VASCULATURE: Aorta is normal in caliber. Mild calcified atherosclerosis of the abdominal aorta. Major arterial structures in the portal venous system are patent. LYMPH NODES: No lymphadenopathy. REPRODUCTIVE ORGANS: Chronic lobulated fibroid uterus, mixed density, heterogeneously enhancing uterine fibroids do not appear significantly changed, the largest are 2 to 3 cm. Ovaries are stable and within normal limits. BONES AND SOFT TISSUES: Disc disease at the lumbosacral junction with vacuum disc. No acute osseous abnormality. No focal soft tissue abnormality. IMPRESSION: 1. Indistinct appearance of some large bowel segments (descending colon), a mild acute colitis would be difficult to exclude. 2. No other acute or inflammatory process identified in the abdomen or pelvis. Electronically signed by: Helayne Hurst MD 07/22/2024 10:44 AM EST RP Workstation: [id number]     Procedures   Medications Ordered in the ED  ondansetron  (ZOFRAN ) injection 4 mg (4 mg Intravenous Given 07/22/24 0928)  lactated ringers  bolus 1,000 mL (0 mLs Intravenous Stopped 07/22/24 1209)  iohexol  (OMNIPAQUE ) 300 MG/ML solution 100 mL (100 mLs Intravenous Contrast Given 07/22/24 1005)                                    Medical Decision Making Amount and/or Complexity of Data Reviewed Labs: ordered. Radiology: ordered.  Risk Prescription drug management.   64 year old female history of delayed gastric emptying and reflux who presents to the emergency room with nausea, vomiting, and diarrhea.   Initial Ddx:  Gastroenteritis, colitis, gastroparesis, C. difficile, appendicitis  MDM/Course:  Patient presents emergency department abdominal pain, nausea, vomiting, diarrhea.  Similarly she has a family history of GI problems.  I had a gastric emptying study previously that did show some delayed emptying.  On exam not in acute distress.  Has some mild abdominal tenderness to palpation.  Given IV fluids and antiemetics and  upon re-evaluation is feeling much improved.  CT scan showing possible colitis.  Patient unable to provide stool sample.  Has allergies to Cipro and Flagyl  so we will go ahead and treat with rifaximin .  Discussed admission versus going home with the antibiotics and nausea medication with GI follow-up.  She would for to go home at this point in time which I feel is reasonable.  Return precaution discussed prior to discharge.  This patient presents to the ED for concern of complaints listed in HPI, this involves an extensive number of  treatment options, and is a complaint that carries with it a high risk of complications and morbidity. Disposition including potential need for admission considered.   Dispo: DC Home. Return precautions discussed including, but not limited to, those listed in the AVS. Allowed pt time to ask questions which were answered fully prior to dc.  I have reviewed the patients home medications and made adjustments as needed Additional history obtained from friend Records reviewed Outpatient Clinic Notes The following labs were independently interpreted: Chemistry and show no acute abnormality I independently reviewed the following imaging with scope of interpretation limited to determining acute life threatening conditions related to emergency care: CT Abdomen/Pelvis and agree with the radiologist interpretation with the following exceptions: none I personally reviewed and interpreted cardiac monitoring: normal sinus rhythm  I personally reviewed and interpreted the pt's EKG: see above for interpretation   Portions of this note were generated with Dragon dictation software. Dictation errors may occur despite best attempts at proofreading.     Final diagnoses:  Colitis  Nausea and vomiting, unspecified vomiting type    ED Discharge Orders          Ordered    sucralfate  (CARAFATE ) 1 g tablet  3 times daily with meals & bedtime        07/22/24 1149    Ambulatory referral  to Gastroenterology        07/22/24 1149    rifaximin  (XIFAXAN ) 550 MG TABS tablet  3 times daily        07/22/24 1153               Yolande Lamar BROCKS, MD 07/22/24 1949  "

## 2024-07-22 NOTE — ED Provider Notes (Signed)
 Had labs done earlier today that showed normal renal function.  Patient does not need to wait for creatinine before CT scan.   Rose Lamar BROCKS, MD 07/22/24 [phone]

## 2024-07-22 NOTE — Discharge Instructions (Addendum)
 You were seen for your nausea, vomiting, and diarrhea in the emergency department.  Your CT scan showed possible colitis.  At home, please take the rifaximin .  Take the nausea medication you have already been prescribed.  Please be sure to orally hydrate.  Take the Carafate  and Pepcid  for your pain.  Take Tylenol  as well but avoid NSAIDs.  Check your MyChart online for the results of any tests that had not resulted by the time you left the emergency department.   Follow-up with your primary doctor in 2-3 days regarding your visit.  Follow-up with the GI doctor soon as possible  Return immediately to the emergency department if you experience any of the following: Worsening pain, vomiting, or any other concerning symptoms.    Thank you for visiting our Emergency Department. It was a pleasure taking care of you today.

## 2024-07-22 NOTE — ED Triage Notes (Signed)
 Pt c/o n/v/d since yesterday. States this has happened x 3 in last 6 months. C/o some dizziness. Mm moist. Nad.

## 2024-07-22 NOTE — ED Notes (Signed)
 Pt had small BM after PO challenge.  Denies nausea, endorses generalized stomach pain.

## 2024-07-22 NOTE — ED Notes (Signed)
 Pt in NAD, no n/v/d.  States having jitters since getting back from CT, but overall feeling ok.  Given small cup of water  for PO challenge.

## 2024-08-10 ENCOUNTER — Ambulatory Visit: Admitting: Gastroenterology

## 2024-08-13 ENCOUNTER — Ambulatory Visit: Admitting: Gastroenterology
